# Patient Record
Sex: Female | Born: 1935
Health system: Southern US, Community
[De-identification: ages and names within clinical notes are randomized; demographics above are authoritative.]

## PROBLEM LIST (undated history)

## (undated) DIAGNOSIS — I10 Essential (primary) hypertension: Secondary | ICD-10-CM

## (undated) DIAGNOSIS — M2010 Hallux valgus (acquired), unspecified foot: Secondary | ICD-10-CM

## (undated) DIAGNOSIS — Z923 Personal history of irradiation: Secondary | ICD-10-CM

## (undated) DIAGNOSIS — Z820 Family history of epilepsy and other diseases of the nervous system: Secondary | ICD-10-CM

## (undated) DIAGNOSIS — R0789 Other chest pain: Secondary | ICD-10-CM

## (undated) DIAGNOSIS — G2581 Restless legs syndrome: Secondary | ICD-10-CM

## (undated) DIAGNOSIS — K219 Gastro-esophageal reflux disease without esophagitis: Secondary | ICD-10-CM

## (undated) DIAGNOSIS — N3941 Urge incontinence: Secondary | ICD-10-CM

## (undated) DIAGNOSIS — R42 Dizziness and giddiness: Secondary | ICD-10-CM

## (undated) DIAGNOSIS — D759 Disease of blood and blood-forming organs, unspecified: Secondary | ICD-10-CM

## (undated) DIAGNOSIS — IMO0002 Reserved for concepts with insufficient information to code with codable children: Secondary | ICD-10-CM

## (undated) DIAGNOSIS — T8859XA Other complications of anesthesia, initial encounter: Secondary | ICD-10-CM

## (undated) DIAGNOSIS — D473 Essential (hemorrhagic) thrombocythemia: Secondary | ICD-10-CM

## (undated) DIAGNOSIS — E781 Pure hyperglyceridemia: Secondary | ICD-10-CM

## (undated) DIAGNOSIS — M8580 Other specified disorders of bone density and structure, unspecified site: Secondary | ICD-10-CM

## (undated) DIAGNOSIS — K5792 Diverticulitis of intestine, part unspecified, without perforation or abscess without bleeding: Secondary | ICD-10-CM

## (undated) DIAGNOSIS — E559 Vitamin D deficiency, unspecified: Secondary | ICD-10-CM

## (undated) DIAGNOSIS — K5730 Diverticulosis of large intestine without perforation or abscess without bleeding: Secondary | ICD-10-CM

## (undated) DIAGNOSIS — M7741 Metatarsalgia, right foot: Secondary | ICD-10-CM

## (undated) DIAGNOSIS — G4700 Insomnia, unspecified: Secondary | ICD-10-CM

## (undated) DIAGNOSIS — T4145XA Adverse effect of unspecified anesthetic, initial encounter: Secondary | ICD-10-CM

## (undated) DIAGNOSIS — R1310 Dysphagia, unspecified: Secondary | ICD-10-CM

## (undated) HISTORY — DX: Dizziness and giddiness: R42

## (undated) HISTORY — DX: Urge incontinence: N39.41

## (undated) HISTORY — DX: Reserved for concepts with insufficient information to code with codable children: IMO0002

## (undated) HISTORY — DX: Essential (hemorrhagic) thrombocythemia: D47.3

## (undated) HISTORY — DX: Diverticulosis of large intestine without perforation or abscess without bleeding: K57.30

## (undated) HISTORY — DX: Family history of epilepsy and other diseases of the nervous system: Z82.0

## (undated) HISTORY — PX: BREAST EXCISIONAL BIOPSY: SUR124

## (undated) HISTORY — DX: Vitamin D deficiency, unspecified: E55.9

## (undated) HISTORY — DX: Essential (primary) hypertension: I10

## (undated) HISTORY — DX: Other chest pain: R07.89

## (undated) HISTORY — DX: Restless legs syndrome: G25.81

## (undated) HISTORY — DX: Gastro-esophageal reflux disease without esophagitis: K21.9

## (undated) HISTORY — DX: Insomnia, unspecified: G47.00

## (undated) HISTORY — DX: Diverticulitis of intestine, part unspecified, without perforation or abscess without bleeding: K57.92

## (undated) HISTORY — DX: Disease of blood and blood-forming organs, unspecified: D75.9

## (undated) HISTORY — DX: Other specified disorders of bone density and structure, unspecified site: M85.80

## (undated) HISTORY — DX: Hallux valgus (acquired), unspecified foot: M20.10

## (undated) HISTORY — DX: Dysphagia, unspecified: R13.10

## (undated) HISTORY — DX: Metatarsalgia, right foot: M77.41

## (undated) HISTORY — DX: Pure hyperglyceridemia: E78.1

---

## 1988-01-07 ENCOUNTER — Encounter (INDEPENDENT_AMBULATORY_CARE_PROVIDER_SITE_OTHER): Payer: Self-pay | Admitting: *Deleted

## 1990-01-06 HISTORY — PX: CHOLECYSTECTOMY: SHX55

## 1996-01-07 HISTORY — PX: BIOPSY BREAST: PRO8

## 1997-11-02 ENCOUNTER — Encounter: Admission: RE | Admit: 1997-11-02 | Discharge: 1997-11-02 | Payer: Self-pay | Admitting: Sports Medicine

## 1997-11-14 ENCOUNTER — Encounter: Admission: RE | Admit: 1997-11-14 | Discharge: 1997-11-14 | Payer: Self-pay | Admitting: Family Medicine

## 1997-11-22 ENCOUNTER — Ambulatory Visit (HOSPITAL_COMMUNITY): Admission: RE | Admit: 1997-11-22 | Discharge: 1997-11-22 | Payer: Self-pay | Admitting: *Deleted

## 1997-11-22 ENCOUNTER — Encounter: Payer: Self-pay | Admitting: *Deleted

## 1998-01-31 ENCOUNTER — Other Ambulatory Visit: Admission: RE | Admit: 1998-01-31 | Discharge: 1998-01-31 | Payer: Self-pay | Admitting: *Deleted

## 1998-07-19 ENCOUNTER — Encounter: Admission: RE | Admit: 1998-07-19 | Discharge: 1998-07-19 | Payer: Self-pay | Admitting: Sports Medicine

## 1998-11-26 ENCOUNTER — Ambulatory Visit (HOSPITAL_COMMUNITY): Admission: RE | Admit: 1998-11-26 | Discharge: 1998-11-26 | Payer: Self-pay | Admitting: *Deleted

## 1999-03-20 ENCOUNTER — Other Ambulatory Visit: Admission: RE | Admit: 1999-03-20 | Discharge: 1999-03-20 | Payer: Self-pay | Admitting: *Deleted

## 1999-05-02 ENCOUNTER — Encounter: Admission: RE | Admit: 1999-05-02 | Discharge: 1999-05-02 | Payer: Self-pay | Admitting: Sports Medicine

## 1999-11-04 ENCOUNTER — Encounter: Admission: RE | Admit: 1999-11-04 | Discharge: 1999-11-04 | Payer: Self-pay | Admitting: Family Medicine

## 1999-12-18 ENCOUNTER — Encounter: Payer: Self-pay | Admitting: *Deleted

## 1999-12-18 ENCOUNTER — Ambulatory Visit (HOSPITAL_COMMUNITY): Admission: RE | Admit: 1999-12-18 | Discharge: 1999-12-18 | Payer: Self-pay | Admitting: *Deleted

## 2000-03-02 ENCOUNTER — Encounter: Admission: RE | Admit: 2000-03-02 | Discharge: 2000-03-02 | Payer: Self-pay | Admitting: Family Medicine

## 2000-04-24 ENCOUNTER — Other Ambulatory Visit: Admission: RE | Admit: 2000-04-24 | Discharge: 2000-04-24 | Payer: Self-pay | Admitting: *Deleted

## 2000-05-29 ENCOUNTER — Encounter: Payer: Self-pay | Admitting: Sports Medicine

## 2000-05-29 ENCOUNTER — Encounter: Admission: RE | Admit: 2000-05-29 | Discharge: 2000-05-29 | Payer: Self-pay | Admitting: Sports Medicine

## 2000-10-09 ENCOUNTER — Encounter: Admission: RE | Admit: 2000-10-09 | Discharge: 2000-10-09 | Payer: Self-pay | Admitting: Sports Medicine

## 2000-10-28 ENCOUNTER — Encounter: Admission: RE | Admit: 2000-10-28 | Discharge: 2000-10-28 | Payer: Self-pay | Admitting: Family Medicine

## 2000-12-31 ENCOUNTER — Encounter: Payer: Self-pay | Admitting: *Deleted

## 2000-12-31 ENCOUNTER — Ambulatory Visit (HOSPITAL_COMMUNITY): Admission: RE | Admit: 2000-12-31 | Discharge: 2000-12-31 | Payer: Self-pay | Admitting: *Deleted

## 2001-05-13 ENCOUNTER — Other Ambulatory Visit: Admission: RE | Admit: 2001-05-13 | Discharge: 2001-05-13 | Payer: Self-pay | Admitting: *Deleted

## 2001-10-18 ENCOUNTER — Encounter: Admission: RE | Admit: 2001-10-18 | Discharge: 2001-10-18 | Payer: Self-pay | Admitting: Family Medicine

## 2001-11-25 ENCOUNTER — Encounter: Admission: RE | Admit: 2001-11-25 | Discharge: 2001-11-25 | Payer: Self-pay | Admitting: Sports Medicine

## 2002-01-25 ENCOUNTER — Encounter: Payer: Self-pay | Admitting: *Deleted

## 2002-01-25 ENCOUNTER — Ambulatory Visit (HOSPITAL_COMMUNITY): Admission: RE | Admit: 2002-01-25 | Discharge: 2002-01-25 | Payer: Self-pay | Admitting: *Deleted

## 2002-07-01 ENCOUNTER — Other Ambulatory Visit: Admission: RE | Admit: 2002-07-01 | Discharge: 2002-07-01 | Payer: Self-pay | Admitting: *Deleted

## 2002-10-26 ENCOUNTER — Encounter: Admission: RE | Admit: 2002-10-26 | Discharge: 2002-10-26 | Payer: Self-pay | Admitting: Family Medicine

## 2002-12-01 ENCOUNTER — Emergency Department (HOSPITAL_COMMUNITY): Admission: EM | Admit: 2002-12-01 | Discharge: 2002-12-01 | Payer: Self-pay | Admitting: Emergency Medicine

## 2002-12-06 ENCOUNTER — Encounter: Admission: RE | Admit: 2002-12-06 | Discharge: 2002-12-06 | Payer: Self-pay | Admitting: Orthopedic Surgery

## 2002-12-07 ENCOUNTER — Ambulatory Visit (HOSPITAL_COMMUNITY): Admission: RE | Admit: 2002-12-07 | Discharge: 2002-12-07 | Payer: Self-pay | Admitting: Orthopedic Surgery

## 2002-12-07 ENCOUNTER — Ambulatory Visit (HOSPITAL_BASED_OUTPATIENT_CLINIC_OR_DEPARTMENT_OTHER): Admission: RE | Admit: 2002-12-07 | Discharge: 2002-12-07 | Payer: Self-pay | Admitting: Orthopedic Surgery

## 2003-03-01 ENCOUNTER — Ambulatory Visit (HOSPITAL_COMMUNITY): Admission: RE | Admit: 2003-03-01 | Discharge: 2003-03-01 | Payer: Self-pay | Admitting: *Deleted

## 2003-03-20 ENCOUNTER — Encounter: Admission: RE | Admit: 2003-03-20 | Discharge: 2003-03-20 | Payer: Self-pay | Admitting: Family Medicine

## 2003-03-21 ENCOUNTER — Encounter: Admission: RE | Admit: 2003-03-21 | Discharge: 2003-03-21 | Payer: Self-pay | Admitting: Family Medicine

## 2003-03-21 ENCOUNTER — Encounter: Admission: RE | Admit: 2003-03-21 | Discharge: 2003-03-21 | Payer: Self-pay | Admitting: Vascular Surgery

## 2003-03-24 ENCOUNTER — Encounter: Admission: RE | Admit: 2003-03-24 | Discharge: 2003-03-24 | Payer: Self-pay | Admitting: Sports Medicine

## 2003-03-30 ENCOUNTER — Encounter: Admission: RE | Admit: 2003-03-30 | Discharge: 2003-03-30 | Payer: Self-pay | Admitting: Sports Medicine

## 2003-04-03 ENCOUNTER — Encounter: Admission: RE | Admit: 2003-04-03 | Discharge: 2003-04-03 | Payer: Self-pay | Admitting: Family Medicine

## 2003-04-07 ENCOUNTER — Encounter: Admission: RE | Admit: 2003-04-07 | Discharge: 2003-04-07 | Payer: Self-pay | Admitting: Family Medicine

## 2003-04-28 ENCOUNTER — Encounter: Admission: RE | Admit: 2003-04-28 | Discharge: 2003-04-28 | Payer: Self-pay | Admitting: Infectious Diseases

## 2003-07-13 ENCOUNTER — Encounter (INDEPENDENT_AMBULATORY_CARE_PROVIDER_SITE_OTHER): Payer: Self-pay | Admitting: Specialist

## 2003-07-13 ENCOUNTER — Other Ambulatory Visit: Admission: RE | Admit: 2003-07-13 | Discharge: 2003-07-13 | Payer: Self-pay | Admitting: Oncology

## 2003-09-28 ENCOUNTER — Ambulatory Visit: Payer: Self-pay | Admitting: Sports Medicine

## 2003-10-23 ENCOUNTER — Ambulatory Visit: Payer: Self-pay | Admitting: Sports Medicine

## 2003-11-18 ENCOUNTER — Ambulatory Visit: Payer: Self-pay | Admitting: Oncology

## 2003-12-13 ENCOUNTER — Ambulatory Visit: Payer: Self-pay | Admitting: Family Medicine

## 2003-12-18 ENCOUNTER — Ambulatory Visit: Payer: Self-pay | Admitting: Family Medicine

## 2004-01-22 ENCOUNTER — Ambulatory Visit: Payer: Self-pay | Admitting: Oncology

## 2004-03-15 ENCOUNTER — Ambulatory Visit (HOSPITAL_COMMUNITY): Admission: RE | Admit: 2004-03-15 | Discharge: 2004-03-15 | Payer: Self-pay | Admitting: *Deleted

## 2004-03-25 ENCOUNTER — Ambulatory Visit: Payer: Self-pay | Admitting: Oncology

## 2004-05-13 ENCOUNTER — Ambulatory Visit: Payer: Self-pay | Admitting: Oncology

## 2004-07-19 ENCOUNTER — Ambulatory Visit: Payer: Self-pay | Admitting: Oncology

## 2004-09-06 ENCOUNTER — Ambulatory Visit: Payer: Self-pay | Admitting: Oncology

## 2004-09-30 ENCOUNTER — Other Ambulatory Visit: Admission: RE | Admit: 2004-09-30 | Discharge: 2004-09-30 | Payer: Self-pay | Admitting: *Deleted

## 2004-10-25 ENCOUNTER — Ambulatory Visit: Payer: Self-pay | Admitting: Family Medicine

## 2004-11-04 ENCOUNTER — Ambulatory Visit: Payer: Self-pay | Admitting: Oncology

## 2004-11-14 ENCOUNTER — Ambulatory Visit: Payer: Self-pay | Admitting: Sports Medicine

## 2004-11-21 ENCOUNTER — Ambulatory Visit: Payer: Self-pay | Admitting: Sports Medicine

## 2004-12-05 ENCOUNTER — Ambulatory Visit: Payer: Self-pay | Admitting: Sports Medicine

## 2004-12-12 ENCOUNTER — Ambulatory Visit: Payer: Self-pay | Admitting: Sports Medicine

## 2004-12-19 ENCOUNTER — Ambulatory Visit: Payer: Self-pay | Admitting: Family Medicine

## 2005-01-07 ENCOUNTER — Ambulatory Visit: Payer: Self-pay | Admitting: Oncology

## 2005-03-10 ENCOUNTER — Ambulatory Visit: Payer: Self-pay | Admitting: Oncology

## 2005-04-24 ENCOUNTER — Ambulatory Visit (HOSPITAL_COMMUNITY): Admission: RE | Admit: 2005-04-24 | Discharge: 2005-04-24 | Payer: Self-pay | Admitting: Obstetrics & Gynecology

## 2005-05-07 ENCOUNTER — Ambulatory Visit: Payer: Self-pay | Admitting: Family Medicine

## 2005-05-09 ENCOUNTER — Ambulatory Visit: Payer: Self-pay | Admitting: Oncology

## 2005-05-13 LAB — MORPHOLOGY: PLT EST: INCREASED

## 2005-05-13 LAB — CBC WITH DIFFERENTIAL/PLATELET
BASO%: 0.2 % (ref 0.0–2.0)
Basophils Absolute: 0 10*3/uL (ref 0.0–0.1)
HCT: 44 % (ref 34.8–46.6)
HGB: 15 g/dL (ref 11.6–15.9)
MONO#: 0.6 10*3/uL (ref 0.1–0.9)
NEUT%: 64 % (ref 39.6–76.8)
RDW: 13.5 % (ref 11.3–14.5)
WBC: 6.4 10*3/uL (ref 3.9–10.0)
lymph#: 1.6 10*3/uL (ref 0.9–3.3)

## 2005-07-02 ENCOUNTER — Ambulatory Visit: Payer: Self-pay | Admitting: Oncology

## 2005-07-04 ENCOUNTER — Ambulatory Visit: Payer: Self-pay | Admitting: Family Medicine

## 2005-07-08 LAB — CBC WITH DIFFERENTIAL/PLATELET
BASO%: 0.4 % (ref 0.0–2.0)
HCT: 43.6 % (ref 34.8–46.6)
LYMPH%: 21.8 % (ref 14.0–48.0)
MCH: 34.4 pg — ABNORMAL HIGH (ref 26.0–34.0)
MCHC: 34.4 g/dL (ref 32.0–36.0)
MCV: 100.1 fL (ref 81.0–101.0)
MONO#: 0.6 10*3/uL (ref 0.1–0.9)
MONO%: 8.8 % (ref 0.0–13.0)
NEUT%: 66.7 % (ref 39.6–76.8)
Platelets: 485 10*3/uL — ABNORMAL HIGH (ref 145–400)
RBC: 4.35 10*6/uL (ref 3.70–5.32)

## 2005-07-08 LAB — COMPREHENSIVE METABOLIC PANEL
Alkaline Phosphatase: 49 U/L (ref 39–117)
BUN: 17 mg/dL (ref 6–23)
Creatinine, Ser: 0.83 mg/dL (ref 0.40–1.20)
Glucose, Bld: 96 mg/dL (ref 70–99)
Total Bilirubin: 0.5 mg/dL (ref 0.3–1.2)

## 2005-07-08 LAB — URIC ACID: Uric Acid, Serum: 5.1 mg/dL (ref 2.4–7.0)

## 2005-09-05 ENCOUNTER — Ambulatory Visit: Payer: Self-pay | Admitting: Oncology

## 2005-09-10 LAB — CBC WITH DIFFERENTIAL/PLATELET
BASO%: 0.3 % (ref 0.0–2.0)
EOS%: 1.5 % (ref 0.0–7.0)
HCT: 40.9 % (ref 34.8–46.6)
LYMPH%: 22.8 % (ref 14.0–48.0)
MCH: 34.3 pg — ABNORMAL HIGH (ref 26.0–34.0)
MCHC: 34.4 g/dL (ref 32.0–36.0)
MONO%: 11 % (ref 0.0–13.0)
NEUT%: 64.4 % (ref 39.6–76.8)
Platelets: 475 10*3/uL — ABNORMAL HIGH (ref 145–400)
RBC: 4.1 10*6/uL (ref 3.70–5.32)
WBC: 7.5 10*3/uL (ref 3.9–10.0)

## 2005-09-10 LAB — MORPHOLOGY: RBC Comments: NORMAL

## 2005-10-30 ENCOUNTER — Ambulatory Visit: Payer: Self-pay | Admitting: Family Medicine

## 2005-11-10 ENCOUNTER — Ambulatory Visit: Payer: Self-pay | Admitting: Oncology

## 2005-11-12 LAB — CBC WITH DIFFERENTIAL/PLATELET
BASO%: 0.4 % (ref 0.0–2.0)
EOS%: 1.4 % (ref 0.0–7.0)
HCT: 41.8 % (ref 34.8–46.6)
LYMPH%: 27.7 % (ref 14.0–48.0)
MCH: 33.9 pg (ref 26.0–34.0)
MCHC: 33.9 g/dL (ref 32.0–36.0)
MONO#: 0.7 10*3/uL (ref 0.1–0.9)
NEUT%: 60.4 % (ref 39.6–76.8)
Platelets: 510 10*3/uL — ABNORMAL HIGH (ref 145–400)
RBC: 4.18 10*6/uL (ref 3.70–5.32)
WBC: 7.1 10*3/uL (ref 3.9–10.0)
lymph#: 2 10*3/uL (ref 0.9–3.3)

## 2005-11-12 LAB — MORPHOLOGY: RBC Comments: NORMAL

## 2005-12-12 LAB — CBC WITH DIFFERENTIAL/PLATELET
BASO%: 0.4 % (ref 0.0–2.0)
HCT: 42.1 % (ref 34.8–46.6)
HGB: 14.1 g/dL (ref 11.6–15.9)
MCHC: 33.4 g/dL (ref 32.0–36.0)
MONO#: 0.6 10*3/uL (ref 0.1–0.9)
NEUT#: 4.5 10*3/uL (ref 1.5–6.5)
NEUT%: 64 % (ref 39.6–76.8)
WBC: 7 10*3/uL (ref 3.9–10.0)
lymph#: 1.8 10*3/uL (ref 0.9–3.3)

## 2006-01-03 ENCOUNTER — Ambulatory Visit: Payer: Self-pay | Admitting: Oncology

## 2006-01-06 HISTORY — PX: COLONOSCOPY: SHX174

## 2006-01-08 LAB — COMPREHENSIVE METABOLIC PANEL
ALT: 21 U/L (ref 0–35)
Albumin: 4.5 g/dL (ref 3.5–5.2)
CO2: 31 mEq/L (ref 19–32)
Calcium: 9.5 mg/dL (ref 8.4–10.5)
Chloride: 103 mEq/L (ref 96–112)
Creatinine, Ser: 0.78 mg/dL (ref 0.40–1.20)
Sodium: 145 mEq/L (ref 135–145)
Total Protein: 7.2 g/dL (ref 6.0–8.3)

## 2006-01-08 LAB — CBC WITH DIFFERENTIAL/PLATELET
BASO%: 0.2 % (ref 0.0–2.0)
HCT: 45.2 % (ref 34.8–46.6)
LYMPH%: 24.8 % (ref 14.0–48.0)
MCHC: 33.3 g/dL (ref 32.0–36.0)
MONO#: 0.6 10*3/uL (ref 0.1–0.9)
NEUT%: 63.6 % (ref 39.6–76.8)
Platelets: 535 10*3/uL — ABNORMAL HIGH (ref 145–400)
WBC: 6.8 10*3/uL (ref 3.9–10.0)

## 2006-01-08 LAB — LACTATE DEHYDROGENASE: LDH: 145 U/L (ref 94–250)

## 2006-01-08 LAB — MORPHOLOGY

## 2006-02-12 LAB — CBC WITH DIFFERENTIAL/PLATELET
BASO%: 0.4 % (ref 0.0–2.0)
EOS%: 1.7 % (ref 0.0–7.0)
MCH: 34.5 pg — ABNORMAL HIGH (ref 26.0–34.0)
MCHC: 35.2 g/dL (ref 32.0–36.0)
RBC: 4.27 10*6/uL (ref 3.70–5.32)
RDW: 13.4 % (ref 11.3–14.5)
lymph#: 1.6 10*3/uL (ref 0.9–3.3)

## 2006-03-05 DIAGNOSIS — M25569 Pain in unspecified knee: Secondary | ICD-10-CM

## 2006-03-05 DIAGNOSIS — D259 Leiomyoma of uterus, unspecified: Secondary | ICD-10-CM

## 2006-03-05 DIAGNOSIS — K5730 Diverticulosis of large intestine without perforation or abscess without bleeding: Secondary | ICD-10-CM | POA: Insufficient documentation

## 2006-03-05 HISTORY — DX: Diverticulosis of large intestine without perforation or abscess without bleeding: K57.30

## 2006-03-06 ENCOUNTER — Encounter (INDEPENDENT_AMBULATORY_CARE_PROVIDER_SITE_OTHER): Payer: Self-pay | Admitting: *Deleted

## 2006-03-09 ENCOUNTER — Ambulatory Visit: Payer: Self-pay | Admitting: Oncology

## 2006-03-12 LAB — CBC WITH DIFFERENTIAL/PLATELET
Basophils Absolute: 0 10*3/uL (ref 0.0–0.1)
EOS%: 1.7 % (ref 0.0–7.0)
Eosinophils Absolute: 0.1 10*3/uL (ref 0.0–0.5)
HGB: 13.7 g/dL (ref 11.6–15.9)
MCH: 33.9 pg (ref 26.0–34.0)
NEUT#: 5 10*3/uL (ref 1.5–6.5)
RBC: 4.04 10*6/uL (ref 3.70–5.32)
RDW: 13.7 % (ref 11.3–14.5)
lymph#: 1.5 10*3/uL (ref 0.9–3.3)

## 2006-04-09 LAB — CBC WITH DIFFERENTIAL/PLATELET
BASO%: 0.9 % (ref 0.0–2.0)
Eosinophils Absolute: 0.1 10*3/uL (ref 0.0–0.5)
MONO#: 1 10*3/uL — ABNORMAL HIGH (ref 0.1–0.9)
NEUT#: 4.2 10*3/uL (ref 1.5–6.5)
RBC: 4.14 10*6/uL (ref 3.70–5.32)
RDW: 13.6 % (ref 11.3–14.5)
WBC: 7.2 10*3/uL (ref 3.9–10.0)
lymph#: 1.9 10*3/uL (ref 0.9–3.3)

## 2006-04-09 LAB — MORPHOLOGY: RBC Comments: NORMAL

## 2006-05-04 ENCOUNTER — Ambulatory Visit: Payer: Self-pay | Admitting: Oncology

## 2006-05-07 LAB — CBC WITH DIFFERENTIAL/PLATELET
BASO%: 0.4 % (ref 0.0–2.0)
Eosinophils Absolute: 0.1 10*3/uL (ref 0.0–0.5)
MCHC: 35 g/dL (ref 32.0–36.0)
MONO#: 0.7 10*3/uL (ref 0.1–0.9)
MONO%: 10.3 % (ref 0.0–13.0)
NEUT#: 4.5 10*3/uL (ref 1.5–6.5)
RBC: 4.3 10*6/uL (ref 3.70–5.32)
RDW: 13.8 % (ref 11.3–14.5)
WBC: 7.1 10*3/uL (ref 3.9–10.0)

## 2006-05-07 LAB — COMPREHENSIVE METABOLIC PANEL
AST: 18 U/L (ref 0–37)
Albumin: 4.4 g/dL (ref 3.5–5.2)
Alkaline Phosphatase: 49 U/L (ref 39–117)
BUN: 17 mg/dL (ref 6–23)
Glucose, Bld: 157 mg/dL — ABNORMAL HIGH (ref 70–99)
Potassium: 4 mEq/L (ref 3.5–5.3)
Total Bilirubin: 0.5 mg/dL (ref 0.3–1.2)

## 2006-05-07 LAB — MORPHOLOGY: RBC Comments: NORMAL

## 2006-05-14 ENCOUNTER — Ambulatory Visit (HOSPITAL_COMMUNITY): Admission: RE | Admit: 2006-05-14 | Discharge: 2006-05-14 | Payer: Self-pay | Admitting: Obstetrics & Gynecology

## 2006-06-15 ENCOUNTER — Encounter: Payer: Self-pay | Admitting: Sports Medicine

## 2006-06-18 ENCOUNTER — Telehealth (INDEPENDENT_AMBULATORY_CARE_PROVIDER_SITE_OTHER): Payer: Self-pay | Admitting: *Deleted

## 2006-07-07 ENCOUNTER — Ambulatory Visit: Payer: Self-pay | Admitting: Oncology

## 2006-07-09 LAB — CBC WITH DIFFERENTIAL/PLATELET
Eosinophils Absolute: 0.1 10*3/uL (ref 0.0–0.5)
HCT: 42 % (ref 34.8–46.6)
LYMPH%: 23.2 % (ref 14.0–48.0)
MCHC: 35.2 g/dL (ref 32.0–36.0)
MCV: 98.4 fL (ref 81.0–101.0)
MONO#: 0.8 10*3/uL (ref 0.1–0.9)
MONO%: 11.3 % (ref 0.0–13.0)
NEUT#: 4.2 10*3/uL (ref 1.5–6.5)
NEUT%: 63.5 % (ref 39.6–76.8)
Platelets: 493 10*3/uL — ABNORMAL HIGH (ref 145–400)
RBC: 4.27 10*6/uL (ref 3.70–5.32)
WBC: 6.6 10*3/uL (ref 3.9–10.0)

## 2006-07-09 LAB — MORPHOLOGY: RBC Comments: NORMAL

## 2006-08-13 LAB — CBC WITH DIFFERENTIAL/PLATELET
Basophils Absolute: 0 10*3/uL (ref 0.0–0.1)
Eosinophils Absolute: 0.1 10*3/uL (ref 0.0–0.5)
HCT: 42 % (ref 34.8–46.6)
HGB: 14.6 g/dL (ref 11.6–15.9)
LYMPH%: 19.9 % (ref 14.0–48.0)
MCV: 97.8 fL (ref 81.0–101.0)
MONO#: 1 10*3/uL — ABNORMAL HIGH (ref 0.1–0.9)
MONO%: 13.3 % — ABNORMAL HIGH (ref 0.0–13.0)
NEUT#: 4.8 10*3/uL (ref 1.5–6.5)
NEUT%: 64.9 % (ref 39.6–76.8)
Platelets: 528 10*3/uL — ABNORMAL HIGH (ref 145–400)
WBC: 7.4 10*3/uL (ref 3.9–10.0)

## 2006-09-09 ENCOUNTER — Ambulatory Visit: Payer: Self-pay | Admitting: Oncology

## 2006-09-11 LAB — CBC WITH DIFFERENTIAL/PLATELET
BASO%: 0.2 % (ref 0.0–2.0)
HCT: 42.8 % (ref 34.8–46.6)
LYMPH%: 22.2 % (ref 14.0–48.0)
MCH: 34.6 pg — ABNORMAL HIGH (ref 26.0–34.0)
MCHC: 35.4 g/dL (ref 32.0–36.0)
MCV: 97.9 fL (ref 81.0–101.0)
MONO#: 0.6 10*3/uL (ref 0.1–0.9)
MONO%: 7.6 % (ref 0.0–13.0)
NEUT%: 68.5 % (ref 39.6–76.8)
Platelets: 536 10*3/uL — ABNORMAL HIGH (ref 145–400)
RBC: 4.37 10*6/uL (ref 3.70–5.32)
WBC: 7.2 10*3/uL (ref 3.9–10.0)

## 2006-09-11 LAB — MORPHOLOGY: PLT EST: INCREASED

## 2006-09-21 ENCOUNTER — Ambulatory Visit: Payer: Self-pay | Admitting: Internal Medicine

## 2006-10-05 ENCOUNTER — Ambulatory Visit: Payer: Self-pay | Admitting: Internal Medicine

## 2006-10-06 LAB — HM COLONOSCOPY

## 2006-10-09 LAB — CBC WITH DIFFERENTIAL/PLATELET
BASO%: 0.3 % (ref 0.0–2.0)
EOS%: 2.2 % (ref 0.0–7.0)
MCH: 34.8 pg — ABNORMAL HIGH (ref 26.0–34.0)
MCHC: 35.3 g/dL (ref 32.0–36.0)
MONO#: 0.6 10*3/uL (ref 0.1–0.9)
RBC: 4.34 10*6/uL (ref 3.70–5.32)
RDW: 14.3 % (ref 11.3–14.5)
WBC: 7.6 10*3/uL (ref 3.9–10.0)
lymph#: 1.6 10*3/uL (ref 0.9–3.3)

## 2006-11-11 ENCOUNTER — Ambulatory Visit: Payer: Self-pay | Admitting: Oncology

## 2006-11-13 ENCOUNTER — Encounter: Payer: Self-pay | Admitting: Sports Medicine

## 2006-11-13 LAB — CBC WITH DIFFERENTIAL/PLATELET
BASO%: 0.6 % (ref 0.0–2.0)
Basophils Absolute: 0 10*3/uL (ref 0.0–0.1)
EOS%: 1.1 % (ref 0.0–7.0)
HCT: 39.9 % (ref 34.8–46.6)
HGB: 13.9 g/dL (ref 11.6–15.9)
MONO#: 0.7 10*3/uL (ref 0.1–0.9)
NEUT%: 63.5 % (ref 39.6–76.8)
RDW: 13.9 % (ref 11.3–14.5)
WBC: 7 10*3/uL (ref 3.9–10.0)
lymph#: 1.8 10*3/uL (ref 0.9–3.3)

## 2006-11-13 LAB — COMPREHENSIVE METABOLIC PANEL
ALT: 19 U/L (ref 0–35)
AST: 18 U/L (ref 0–37)
Alkaline Phosphatase: 47 U/L (ref 39–117)
BUN: 17 mg/dL (ref 6–23)
Calcium: 9.7 mg/dL (ref 8.4–10.5)
Chloride: 106 mEq/L (ref 96–112)
Creatinine, Ser: 0.89 mg/dL (ref 0.40–1.20)
Potassium: 4.6 mEq/L (ref 3.5–5.3)

## 2006-11-13 LAB — MORPHOLOGY: PLT EST: INCREASED

## 2006-11-13 LAB — LACTATE DEHYDROGENASE: LDH: 142 U/L (ref 94–250)

## 2006-11-17 ENCOUNTER — Encounter: Payer: Self-pay | Admitting: Sports Medicine

## 2006-11-24 ENCOUNTER — Ambulatory Visit: Payer: Self-pay | Admitting: Family Medicine

## 2006-11-24 ENCOUNTER — Encounter (INDEPENDENT_AMBULATORY_CARE_PROVIDER_SITE_OTHER): Payer: Self-pay | Admitting: Family Medicine

## 2006-11-24 LAB — CONVERTED CEMR LAB
BUN: 11 mg/dL (ref 6–23)
CRP: 5.7 mg/dL — ABNORMAL HIGH (ref <0.6)
Creatinine, Ser: 0.77 mg/dL (ref 0.40–1.20)
Eosinophils Absolute: 0.1 10*3/uL — ABNORMAL LOW (ref 0.2–0.7)
Glucose, Bld: 107 mg/dL — ABNORMAL HIGH (ref 70–99)
HCT: 44.2 % (ref 36.0–46.0)
Hemoglobin: 14.1 g/dL (ref 12.0–15.0)
Lymphs Abs: 1.4 10*3/uL (ref 0.7–4.0)
MCHC: 31.9 g/dL (ref 30.0–36.0)
MCV: 103.8 fL — ABNORMAL HIGH (ref 78.0–100.0)
Monocytes Absolute: 1.2 10*3/uL — ABNORMAL HIGH (ref 0.1–1.0)
Monocytes Relative: 11 % (ref 3–12)
Neutrophils Relative %: 75 % (ref 43–77)
Potassium: 4.2 meq/L (ref 3.5–5.3)
RBC: 4.26 M/uL (ref 3.87–5.11)
WBC: 10.5 10*3/uL (ref 4.0–10.5)

## 2006-11-25 ENCOUNTER — Telehealth (INDEPENDENT_AMBULATORY_CARE_PROVIDER_SITE_OTHER): Payer: Self-pay | Admitting: *Deleted

## 2006-11-25 ENCOUNTER — Ambulatory Visit (HOSPITAL_COMMUNITY): Admission: RE | Admit: 2006-11-25 | Discharge: 2006-11-25 | Payer: Self-pay | Admitting: Family Medicine

## 2006-11-27 ENCOUNTER — Ambulatory Visit: Payer: Self-pay | Admitting: Family Medicine

## 2006-12-02 ENCOUNTER — Encounter: Payer: Self-pay | Admitting: Sports Medicine

## 2006-12-15 LAB — CBC WITH DIFFERENTIAL/PLATELET
BASO%: 1.2 % (ref 0.0–2.0)
LYMPH%: 20.6 % (ref 14.0–48.0)
MCHC: 34.5 g/dL (ref 32.0–36.0)
MCV: 99.3 fL (ref 81.0–101.0)
MONO#: 0.6 10*3/uL (ref 0.1–0.9)
MONO%: 8.6 % (ref 0.0–13.0)
Platelets: 682 10*3/uL — ABNORMAL HIGH (ref 145–400)
RBC: 4.26 10*6/uL (ref 3.70–5.32)
WBC: 7.3 10*3/uL (ref 3.9–10.0)

## 2006-12-15 LAB — MORPHOLOGY

## 2006-12-27 ENCOUNTER — Emergency Department (HOSPITAL_COMMUNITY): Admission: EM | Admit: 2006-12-27 | Discharge: 2006-12-27 | Payer: Self-pay | Admitting: Family Medicine

## 2006-12-28 ENCOUNTER — Ambulatory Visit: Payer: Self-pay | Admitting: Oncology

## 2007-01-01 LAB — CBC WITH DIFFERENTIAL/PLATELET
BASO%: 0.4 % (ref 0.0–2.0)
EOS%: 1.7 % (ref 0.0–7.0)
HCT: 41.8 % (ref 34.8–46.6)
LYMPH%: 22.4 % (ref 14.0–48.0)
MCH: 34.2 pg — ABNORMAL HIGH (ref 26.0–34.0)
MCHC: 34.4 g/dL (ref 32.0–36.0)
NEUT%: 66.5 % (ref 39.6–76.8)
Platelets: 574 10*3/uL — ABNORMAL HIGH (ref 145–400)

## 2007-01-05 ENCOUNTER — Ambulatory Visit: Payer: Self-pay | Admitting: Sports Medicine

## 2007-01-05 DIAGNOSIS — E781 Pure hyperglyceridemia: Secondary | ICD-10-CM

## 2007-01-05 DIAGNOSIS — D759 Disease of blood and blood-forming organs, unspecified: Secondary | ICD-10-CM

## 2007-01-05 HISTORY — DX: Pure hyperglyceridemia: E78.1

## 2007-01-05 HISTORY — DX: Disease of blood and blood-forming organs, unspecified: D75.9

## 2007-01-08 LAB — CONVERTED CEMR LAB
Total CHOL/HDL Ratio: 3.7
VLDL: 33 mg/dL (ref 0–40)

## 2007-02-05 LAB — MORPHOLOGY

## 2007-02-05 LAB — CBC WITH DIFFERENTIAL/PLATELET
BASO%: 0.4 % (ref 0.0–2.0)
EOS%: 0.8 % (ref 0.0–7.0)
MCH: 33.8 pg (ref 26.0–34.0)
MCHC: 33.8 g/dL (ref 32.0–36.0)
NEUT%: 63.6 % (ref 39.6–76.8)
RDW: 14.7 % — ABNORMAL HIGH (ref 11.3–14.5)
lymph#: 1.6 10*3/uL (ref 0.9–3.3)

## 2007-03-04 ENCOUNTER — Ambulatory Visit: Payer: Self-pay | Admitting: Family Medicine

## 2007-03-08 ENCOUNTER — Emergency Department (HOSPITAL_COMMUNITY): Admission: EM | Admit: 2007-03-08 | Discharge: 2007-03-08 | Payer: Self-pay | Admitting: Family Medicine

## 2007-03-08 ENCOUNTER — Ambulatory Visit: Payer: Self-pay | Admitting: Oncology

## 2007-03-10 ENCOUNTER — Encounter: Payer: Self-pay | Admitting: Sports Medicine

## 2007-03-10 LAB — CBC WITH DIFFERENTIAL/PLATELET
Basophils Absolute: 0 10*3/uL (ref 0.0–0.1)
EOS%: 1.6 % (ref 0.0–7.0)
HGB: 14.6 g/dL (ref 11.6–15.9)
MCH: 35 pg — ABNORMAL HIGH (ref 26.0–34.0)
MCV: 101 fL (ref 81.0–101.0)
MONO%: 14.8 % — ABNORMAL HIGH (ref 0.0–13.0)
RBC: 4.17 10*6/uL (ref 3.70–5.32)
RDW: 14.9 % — ABNORMAL HIGH (ref 11.3–14.5)

## 2007-03-10 LAB — CONVERTED CEMR LAB
Hemoglobin: 14.6 g/dL
Platelets: 425 10*3/uL
WBC: 7.1 10*3/uL

## 2007-03-10 LAB — COMPREHENSIVE METABOLIC PANEL
Albumin: 4.4 g/dL (ref 3.5–5.2)
Alkaline Phosphatase: 61 U/L (ref 39–117)
BUN: 16 mg/dL (ref 6–23)
Creatinine, Ser: 1.12 mg/dL (ref 0.40–1.20)
Glucose, Bld: 130 mg/dL — ABNORMAL HIGH (ref 70–99)
Total Bilirubin: 0.6 mg/dL (ref 0.3–1.2)

## 2007-03-10 LAB — MORPHOLOGY: RBC Comments: NORMAL

## 2007-03-10 LAB — URIC ACID: Uric Acid, Serum: 5.6 mg/dL (ref 2.4–7.0)

## 2007-03-15 ENCOUNTER — Encounter: Payer: Self-pay | Admitting: Sports Medicine

## 2007-04-13 LAB — CBC WITH DIFFERENTIAL/PLATELET
EOS%: 0.5 % (ref 0.0–7.0)
MCH: 35.6 pg — ABNORMAL HIGH (ref 26.0–34.0)
MCHC: 34.7 g/dL (ref 32.0–36.0)
MCV: 102.5 fL — ABNORMAL HIGH (ref 81.0–101.0)
MONO%: 11.2 % (ref 0.0–13.0)
RBC: 3.89 10*6/uL (ref 3.70–5.32)
RDW: 14.3 % (ref 11.3–14.5)

## 2007-04-13 LAB — MORPHOLOGY: RBC Comments: NORMAL

## 2007-05-07 ENCOUNTER — Ambulatory Visit: Payer: Self-pay | Admitting: Oncology

## 2007-05-12 LAB — CBC WITH DIFFERENTIAL/PLATELET
EOS%: 1.2 % (ref 0.0–7.0)
MCH: 35.5 pg — ABNORMAL HIGH (ref 26.0–34.0)
MCV: 103.6 fL — ABNORMAL HIGH (ref 81.0–101.0)
MONO%: 10.7 % (ref 0.0–13.0)
NEUT#: 3.6 10*3/uL (ref 1.5–6.5)
RBC: 3.7 10*6/uL (ref 3.70–5.32)
RDW: 13.9 % (ref 11.3–14.5)

## 2007-05-12 LAB — MORPHOLOGY: RBC Comments: NORMAL

## 2007-05-27 ENCOUNTER — Ambulatory Visit (HOSPITAL_COMMUNITY): Admission: RE | Admit: 2007-05-27 | Discharge: 2007-05-27 | Payer: Self-pay | Admitting: Sports Medicine

## 2007-07-08 ENCOUNTER — Ambulatory Visit: Payer: Self-pay | Admitting: Oncology

## 2007-07-13 LAB — CBC WITH DIFFERENTIAL/PLATELET
EOS%: 1.6 % (ref 0.0–7.0)
MCH: 36.3 pg — ABNORMAL HIGH (ref 26.0–34.0)
MCHC: 34.4 g/dL (ref 32.0–36.0)
MCV: 105 fL — ABNORMAL HIGH (ref 81.0–101.0)
MONO%: 13 % (ref 0.0–13.0)
RBC: 3.79 10*6/uL (ref 3.70–5.32)
RDW: 13.2 % (ref 11.3–14.5)

## 2007-07-13 LAB — MORPHOLOGY: PLT EST: INCREASED

## 2007-09-08 ENCOUNTER — Ambulatory Visit: Payer: Self-pay | Admitting: Oncology

## 2007-09-14 ENCOUNTER — Encounter: Payer: Self-pay | Admitting: Family Medicine

## 2007-09-14 LAB — COMPREHENSIVE METABOLIC PANEL
ALT: 15 U/L (ref 0–35)
AST: 13 U/L (ref 0–37)
Albumin: 4.1 g/dL (ref 3.5–5.2)
Alkaline Phosphatase: 42 U/L (ref 39–117)
CO2: 24 mEq/L (ref 19–32)
Calcium: 9 mg/dL (ref 8.4–10.5)
Chloride: 106 mEq/L (ref 96–112)
Creatinine, Ser: 0.77 mg/dL (ref 0.40–1.20)
Potassium: 4.3 mEq/L (ref 3.5–5.3)
Sodium: 141 mEq/L (ref 135–145)
Total Protein: 6.6 g/dL (ref 6.0–8.3)

## 2007-09-14 LAB — CBC WITH DIFFERENTIAL/PLATELET
Basophils Absolute: 0 10*3/uL (ref 0.0–0.1)
Eosinophils Absolute: 0.1 10*3/uL (ref 0.0–0.5)
HGB: 13.6 g/dL (ref 11.6–15.9)
MONO#: 0.5 10*3/uL (ref 0.1–0.9)
NEUT#: 3.6 10*3/uL (ref 1.5–6.5)
RBC: 3.78 10*6/uL (ref 3.70–5.32)
RDW: 13.4 % (ref 11.3–14.5)
WBC: 5.4 10*3/uL (ref 3.9–10.0)

## 2007-09-14 LAB — MORPHOLOGY: PLT EST: INCREASED

## 2007-09-14 LAB — LACTATE DEHYDROGENASE: LDH: 128 U/L (ref 94–250)

## 2007-09-20 ENCOUNTER — Telehealth (INDEPENDENT_AMBULATORY_CARE_PROVIDER_SITE_OTHER): Payer: Self-pay | Admitting: *Deleted

## 2007-10-22 LAB — CBC WITH DIFFERENTIAL/PLATELET
Basophils Absolute: 0 10*3/uL (ref 0.0–0.1)
Eosinophils Absolute: 0.1 10*3/uL (ref 0.0–0.5)
HGB: 13.3 g/dL (ref 11.6–15.9)
LYMPH%: 24 % (ref 14.0–48.0)
MCV: 105.6 fL — ABNORMAL HIGH (ref 81.0–101.0)
MONO#: 0.5 10*3/uL (ref 0.1–0.9)
MONO%: 9.3 % (ref 0.0–13.0)
NEUT#: 3.6 10*3/uL (ref 1.5–6.5)
Platelets: 411 10*3/uL — ABNORMAL HIGH (ref 145–400)
RBC: 3.62 10*6/uL — ABNORMAL LOW (ref 3.70–5.32)
RDW: 13.8 % (ref 11.3–14.5)
WBC: 5.6 10*3/uL (ref 3.9–10.0)

## 2007-10-22 LAB — MORPHOLOGY

## 2007-10-25 ENCOUNTER — Telehealth: Payer: Self-pay | Admitting: Family Medicine

## 2007-11-16 ENCOUNTER — Ambulatory Visit: Payer: Self-pay | Admitting: Oncology

## 2007-11-18 LAB — CBC WITH DIFFERENTIAL/PLATELET
Basophils Absolute: 0 10*3/uL (ref 0.0–0.1)
Eosinophils Absolute: 0.1 10*3/uL (ref 0.0–0.5)
HCT: 38.3 % (ref 34.8–46.6)
HGB: 13.2 g/dL (ref 11.6–15.9)
LYMPH%: 28.1 % (ref 14.0–48.0)
MCV: 106.3 fL — ABNORMAL HIGH (ref 81.0–101.0)
MONO#: 0.6 10*3/uL (ref 0.1–0.9)
MONO%: 11 % (ref 0.0–13.0)
NEUT#: 3.2 10*3/uL (ref 1.5–6.5)
Platelets: 479 10*3/uL — ABNORMAL HIGH (ref 145–400)
WBC: 5.4 10*3/uL (ref 3.9–10.0)

## 2007-11-18 LAB — MORPHOLOGY: RBC Comments: NORMAL

## 2007-12-16 LAB — CBC WITH DIFFERENTIAL/PLATELET
BASO%: 0.4 % (ref 0.0–2.0)
Basophils Absolute: 0 10*3/uL (ref 0.0–0.1)
EOS%: 1.4 % (ref 0.0–7.0)
HCT: 39.3 % (ref 34.8–46.6)
HGB: 13.5 g/dL (ref 11.6–15.9)
LYMPH%: 28.9 % (ref 14.0–48.0)
MCH: 36.6 pg — ABNORMAL HIGH (ref 26.0–34.0)
MCHC: 34.2 g/dL (ref 32.0–36.0)
MCV: 106.8 fL — ABNORMAL HIGH (ref 81.0–101.0)
MONO%: 10.3 % (ref 0.0–13.0)
NEUT%: 59 % (ref 39.6–76.8)
Platelets: 485 10*3/uL — ABNORMAL HIGH (ref 145–400)

## 2007-12-16 LAB — MORPHOLOGY

## 2008-01-07 HISTORY — PX: CATARACT EXTRACTION: SUR2

## 2008-01-11 ENCOUNTER — Ambulatory Visit: Payer: Self-pay | Admitting: Oncology

## 2008-01-18 LAB — CBC WITH DIFFERENTIAL/PLATELET
BASO%: 0.4 % (ref 0.0–2.0)
Basophils Absolute: 0 10*3/uL (ref 0.0–0.1)
EOS%: 1 % (ref 0.0–7.0)
HCT: 40.4 % (ref 34.8–46.6)
LYMPH%: 28.8 % (ref 14.0–48.0)
MCH: 36.5 pg — ABNORMAL HIGH (ref 26.0–34.0)
MCHC: 34.2 g/dL (ref 32.0–36.0)
MCV: 106.6 fL — ABNORMAL HIGH (ref 81.0–101.0)
MONO%: 10.4 % (ref 0.0–13.0)
NEUT%: 59.4 % (ref 39.6–76.8)
lymph#: 1.6 10*3/uL (ref 0.9–3.3)

## 2008-01-18 LAB — MORPHOLOGY: PLT EST: ADEQUATE

## 2008-02-15 ENCOUNTER — Encounter: Payer: Self-pay | Admitting: Family Medicine

## 2008-02-22 ENCOUNTER — Ambulatory Visit: Payer: Self-pay | Admitting: Family Medicine

## 2008-02-22 DIAGNOSIS — I10 Essential (primary) hypertension: Secondary | ICD-10-CM | POA: Insufficient documentation

## 2008-02-22 HISTORY — DX: Essential (primary) hypertension: I10

## 2008-02-24 ENCOUNTER — Telehealth: Payer: Self-pay | Admitting: *Deleted

## 2008-02-24 LAB — CONVERTED CEMR LAB
CO2: 26 meq/L (ref 19–32)
Glucose, Bld: 88 mg/dL (ref 70–99)
Potassium: 4.7 meq/L (ref 3.5–5.3)
Sodium: 140 meq/L (ref 135–145)

## 2008-02-29 ENCOUNTER — Ambulatory Visit (HOSPITAL_COMMUNITY): Admission: RE | Admit: 2008-02-29 | Discharge: 2008-02-29 | Payer: Self-pay | Admitting: Family Medicine

## 2008-03-10 ENCOUNTER — Ambulatory Visit: Payer: Self-pay | Admitting: Oncology

## 2008-03-14 ENCOUNTER — Encounter: Payer: Self-pay | Admitting: Family Medicine

## 2008-03-14 LAB — CBC WITH DIFFERENTIAL/PLATELET
BASO%: 0.3 % (ref 0.0–2.0)
EOS%: 0.7 % (ref 0.0–7.0)
HCT: 38.4 % (ref 34.8–46.6)
LYMPH%: 20 % (ref 14.0–49.7)
MCH: 36.2 pg — ABNORMAL HIGH (ref 25.1–34.0)
MCHC: 34.2 g/dL (ref 31.5–36.0)
MONO%: 10.9 % (ref 0.0–14.0)
NEUT%: 68.1 % (ref 38.4–76.8)
Platelets: 493 10*3/uL — ABNORMAL HIGH (ref 145–400)
lymph#: 1.3 10*3/uL (ref 0.9–3.3)

## 2008-03-14 LAB — COMPREHENSIVE METABOLIC PANEL
ALT: 16 U/L (ref 0–35)
AST: 13 U/L (ref 0–37)
CO2: 27 mEq/L (ref 19–32)
Chloride: 107 mEq/L (ref 96–112)
Sodium: 142 mEq/L (ref 135–145)
Total Bilirubin: 0.4 mg/dL (ref 0.3–1.2)
Total Protein: 6.6 g/dL (ref 6.0–8.3)

## 2008-03-14 LAB — LACTATE DEHYDROGENASE: LDH: 126 U/L (ref 94–250)

## 2008-03-15 ENCOUNTER — Telehealth: Payer: Self-pay | Admitting: Family Medicine

## 2008-03-17 ENCOUNTER — Encounter: Payer: Self-pay | Admitting: Family Medicine

## 2008-03-17 ENCOUNTER — Telehealth: Payer: Self-pay | Admitting: Family Medicine

## 2008-03-22 ENCOUNTER — Encounter: Payer: Self-pay | Admitting: Family Medicine

## 2008-05-05 ENCOUNTER — Ambulatory Visit: Payer: Self-pay | Admitting: Oncology

## 2008-06-19 ENCOUNTER — Encounter: Payer: Self-pay | Admitting: Family Medicine

## 2008-06-19 ENCOUNTER — Telehealth: Payer: Self-pay | Admitting: Family Medicine

## 2008-06-26 ENCOUNTER — Telehealth: Payer: Self-pay | Admitting: Family Medicine

## 2008-06-27 ENCOUNTER — Ambulatory Visit: Payer: Self-pay | Admitting: Family Medicine

## 2008-06-30 ENCOUNTER — Ambulatory Visit: Payer: Self-pay | Admitting: Oncology

## 2008-07-04 LAB — CBC WITH DIFFERENTIAL/PLATELET
BASO%: 0.4 % (ref 0.0–2.0)
LYMPH%: 27.1 % (ref 14.0–49.7)
MCHC: 34.5 g/dL (ref 31.5–36.0)
MONO#: 0.5 10*3/uL (ref 0.1–0.9)
Platelets: 449 10*3/uL — ABNORMAL HIGH (ref 145–400)
RBC: 3.72 10*6/uL (ref 3.70–5.45)
WBC: 5.9 10*3/uL (ref 3.9–10.3)
lymph#: 1.6 10*3/uL (ref 0.9–3.3)

## 2008-07-04 LAB — MORPHOLOGY

## 2008-07-27 ENCOUNTER — Encounter: Payer: Self-pay | Admitting: Family Medicine

## 2008-08-08 ENCOUNTER — Ambulatory Visit: Payer: Self-pay | Admitting: Sports Medicine

## 2008-08-25 ENCOUNTER — Ambulatory Visit: Payer: Self-pay | Admitting: Oncology

## 2008-08-29 ENCOUNTER — Encounter: Payer: Self-pay | Admitting: Family Medicine

## 2008-08-29 LAB — CBC WITH DIFFERENTIAL/PLATELET
Eosinophils Absolute: 0.1 10*3/uL (ref 0.0–0.5)
MONO#: 0.7 10*3/uL (ref 0.1–0.9)
NEUT#: 4 10*3/uL (ref 1.5–6.5)
RBC: 3.6 10*6/uL — ABNORMAL LOW (ref 3.70–5.45)
RDW: 13.7 % (ref 11.2–14.5)
WBC: 6.3 10*3/uL (ref 3.9–10.3)
lymph#: 1.5 10*3/uL (ref 0.9–3.3)

## 2008-08-29 LAB — MORPHOLOGY: PLT EST: INCREASED

## 2008-09-12 ENCOUNTER — Encounter: Payer: Self-pay | Admitting: Family Medicine

## 2008-09-12 LAB — CBC WITH DIFFERENTIAL/PLATELET
BASO%: 0.5 % (ref 0.0–2.0)
MCHC: 34.1 g/dL (ref 31.5–36.0)
MONO#: 0.7 10*3/uL (ref 0.1–0.9)
NEUT#: 3.4 10*3/uL (ref 1.5–6.5)
RBC: 3.81 10*6/uL (ref 3.70–5.45)
RDW: 13.4 % (ref 11.2–14.5)
WBC: 5.5 10*3/uL (ref 3.9–10.3)
lymph#: 1.4 10*3/uL (ref 0.9–3.3)

## 2008-09-12 LAB — MORPHOLOGY
PLT EST: INCREASED
RBC Comments: NORMAL

## 2008-09-12 LAB — COMPREHENSIVE METABOLIC PANEL
ALT: 16 U/L (ref 0–35)
Alkaline Phosphatase: 41 U/L (ref 39–117)
CO2: 27 mEq/L (ref 19–32)
Creatinine, Ser: 0.83 mg/dL (ref 0.40–1.20)
Total Bilirubin: 0.5 mg/dL (ref 0.3–1.2)

## 2008-09-12 LAB — LACTATE DEHYDROGENASE: LDH: 144 U/L (ref 94–250)

## 2008-10-05 ENCOUNTER — Ambulatory Visit: Payer: Self-pay | Admitting: Sports Medicine

## 2008-11-02 ENCOUNTER — Ambulatory Visit: Payer: Self-pay | Admitting: Oncology

## 2008-11-07 LAB — CBC WITH DIFFERENTIAL/PLATELET
BASO%: 0.1 % (ref 0.0–2.0)
EOS%: 0.9 % (ref 0.0–7.0)
LYMPH%: 22.5 % (ref 14.0–49.7)
MCHC: 33.5 g/dL (ref 31.5–36.0)
MCV: 106.7 fL — ABNORMAL HIGH (ref 79.5–101.0)
MONO%: 10.4 % (ref 0.0–14.0)
Platelets: 542 10*3/uL — ABNORMAL HIGH (ref 145–400)
RBC: 3.7 10*6/uL (ref 3.70–5.45)
WBC: 6.2 10*3/uL (ref 3.9–10.3)

## 2008-11-07 LAB — MORPHOLOGY
PLT EST: INCREASED
RBC Comments: NORMAL

## 2008-11-16 ENCOUNTER — Telehealth: Payer: Self-pay | Admitting: Family Medicine

## 2008-11-27 ENCOUNTER — Ambulatory Visit: Payer: Self-pay | Admitting: Sports Medicine

## 2008-12-20 ENCOUNTER — Ambulatory Visit: Payer: Self-pay | Admitting: Family Medicine

## 2009-01-02 ENCOUNTER — Ambulatory Visit: Payer: Self-pay | Admitting: Oncology

## 2009-01-04 LAB — CBC WITH DIFFERENTIAL/PLATELET
Basophils Absolute: 0 10*3/uL (ref 0.0–0.1)
EOS%: 1.2 % (ref 0.0–7.0)
HGB: 13.4 g/dL (ref 11.6–15.9)
MCH: 36.2 pg — ABNORMAL HIGH (ref 25.1–34.0)
NEUT#: 4.2 10*3/uL (ref 1.5–6.5)
RDW: 13.4 % (ref 11.2–14.5)
WBC: 6.2 10*3/uL (ref 3.9–10.3)
lymph#: 1.2 10*3/uL (ref 0.9–3.3)

## 2009-01-04 LAB — MORPHOLOGY

## 2009-01-10 ENCOUNTER — Encounter: Payer: Self-pay | Admitting: Family Medicine

## 2009-01-18 ENCOUNTER — Encounter: Payer: Self-pay | Admitting: Family Medicine

## 2009-01-18 LAB — CBC WITH DIFFERENTIAL/PLATELET
BASO%: 0.5 % (ref 0.0–2.0)
Basophils Absolute: 0 10*3/uL (ref 0.0–0.1)
EOS%: 1.5 % (ref 0.0–7.0)
HCT: 42.8 % (ref 34.8–46.6)
LYMPH%: 26.1 % (ref 14.0–49.7)
MCH: 35 pg — ABNORMAL HIGH (ref 25.1–34.0)
MCHC: 32.9 g/dL (ref 31.5–36.0)
MONO#: 0.6 10*3/uL (ref 0.1–0.9)
NEUT%: 62.1 % (ref 38.4–76.8)
Platelets: 509 10*3/uL — ABNORMAL HIGH (ref 145–400)

## 2009-01-23 ENCOUNTER — Encounter: Payer: Self-pay | Admitting: Family Medicine

## 2009-01-24 ENCOUNTER — Ambulatory Visit: Payer: Self-pay | Admitting: Family Medicine

## 2009-01-24 LAB — CONVERTED CEMR LAB
Ketones, urine, test strip: NEGATIVE
Nitrite: NEGATIVE
Urobilinogen, UA: 0.2

## 2009-01-25 ENCOUNTER — Encounter: Payer: Self-pay | Admitting: Family Medicine

## 2009-01-25 LAB — CONVERTED CEMR LAB
ALT: 21 units/L (ref 0–35)
AST: 17 units/L (ref 0–37)
Albumin: 4.5 g/dL (ref 3.5–5.2)
Calcium: 8.9 mg/dL (ref 8.4–10.5)
Chloride: 101 meq/L (ref 96–112)
Creatinine, Ser: 0.7 mg/dL (ref 0.40–1.20)
Potassium: 4.2 meq/L (ref 3.5–5.3)
Sodium: 141 meq/L (ref 135–145)
Total CHOL/HDL Ratio: 2.9

## 2009-02-01 ENCOUNTER — Ambulatory Visit: Payer: Self-pay | Admitting: Oncology

## 2009-02-01 LAB — CBC WITH DIFFERENTIAL/PLATELET
BASO%: 0.4 % (ref 0.0–2.0)
EOS%: 1.4 % (ref 0.0–7.0)
HCT: 40.3 % (ref 34.8–46.6)
LYMPH%: 26.8 % (ref 14.0–49.7)
MCH: 35.6 pg — ABNORMAL HIGH (ref 25.1–34.0)
MCHC: 33.6 g/dL (ref 31.5–36.0)
MCV: 106 fL — ABNORMAL HIGH (ref 79.5–101.0)
MONO%: 9.3 % (ref 0.0–14.0)
NEUT%: 62.1 % (ref 38.4–76.8)
lymph#: 1.7 10*3/uL (ref 0.9–3.3)

## 2009-02-08 ENCOUNTER — Encounter: Payer: Self-pay | Admitting: Family Medicine

## 2009-02-27 LAB — CBC WITH DIFFERENTIAL/PLATELET
Basophils Absolute: 0 10*3/uL (ref 0.0–0.1)
Eosinophils Absolute: 0.1 10*3/uL (ref 0.0–0.5)
HCT: 41.7 % (ref 34.8–46.6)
HGB: 14.1 g/dL (ref 11.6–15.9)
NEUT#: 3.8 10*3/uL (ref 1.5–6.5)
RDW: 14.3 % (ref 11.2–14.5)
lymph#: 1.8 10*3/uL (ref 0.9–3.3)

## 2009-02-27 LAB — MORPHOLOGY: PLT EST: INCREASED

## 2009-03-09 ENCOUNTER — Ambulatory Visit: Payer: Self-pay | Admitting: Oncology

## 2009-03-10 ENCOUNTER — Emergency Department (HOSPITAL_COMMUNITY): Admission: EM | Admit: 2009-03-10 | Discharge: 2009-03-10 | Payer: Self-pay | Admitting: Family Medicine

## 2009-03-12 ENCOUNTER — Ambulatory Visit: Payer: Self-pay | Admitting: Family Medicine

## 2009-03-13 ENCOUNTER — Encounter: Payer: Self-pay | Admitting: Family Medicine

## 2009-03-13 LAB — CBC WITH DIFFERENTIAL/PLATELET
Basophils Absolute: 0 10*3/uL (ref 0.0–0.1)
EOS%: 1.4 % (ref 0.0–7.0)
Eosinophils Absolute: 0.1 10*3/uL (ref 0.0–0.5)
HCT: 42.6 % (ref 34.8–46.6)
HGB: 14.5 g/dL (ref 11.6–15.9)
MCH: 36.2 pg — ABNORMAL HIGH (ref 25.1–34.0)
MONO#: 0.8 10*3/uL (ref 0.1–0.9)
NEUT#: 4.7 10*3/uL (ref 1.5–6.5)
NEUT%: 67.2 % (ref 38.4–76.8)
RDW: 13.4 % (ref 11.2–14.5)
WBC: 7 10*3/uL (ref 3.9–10.3)
lymph#: 1.4 10*3/uL (ref 0.9–3.3)

## 2009-03-13 LAB — LACTATE DEHYDROGENASE: LDH: 150 U/L (ref 94–250)

## 2009-03-13 LAB — MORPHOLOGY

## 2009-03-13 LAB — COMPREHENSIVE METABOLIC PANEL
ALT: 20 U/L (ref 0–35)
Alkaline Phosphatase: 44 U/L (ref 39–117)
CO2: 26 mEq/L (ref 19–32)
Creatinine, Ser: 0.72 mg/dL (ref 0.40–1.20)
Glucose, Bld: 89 mg/dL (ref 70–99)
Total Bilirubin: 0.6 mg/dL (ref 0.3–1.2)

## 2009-03-13 LAB — URIC ACID: Uric Acid, Serum: 4.8 mg/dL (ref 2.4–7.0)

## 2009-03-26 ENCOUNTER — Ambulatory Visit: Payer: Self-pay | Admitting: Family Medicine

## 2009-04-10 ENCOUNTER — Ambulatory Visit: Payer: Self-pay | Admitting: Family Medicine

## 2009-04-10 ENCOUNTER — Ambulatory Visit: Payer: Self-pay | Admitting: Oncology

## 2009-04-10 DIAGNOSIS — M2010 Hallux valgus (acquired), unspecified foot: Secondary | ICD-10-CM

## 2009-04-10 HISTORY — DX: Hallux valgus (acquired), unspecified foot: M20.10

## 2009-04-10 LAB — CBC WITH DIFFERENTIAL/PLATELET
Basophils Absolute: 0 10*3/uL (ref 0.0–0.1)
Eosinophils Absolute: 0.1 10*3/uL (ref 0.0–0.5)
HCT: 42.2 % (ref 34.8–46.6)
LYMPH%: 22.4 % (ref 14.0–49.7)
MCV: 105.5 fL — ABNORMAL HIGH (ref 79.5–101.0)
MONO#: 0.9 10*3/uL (ref 0.1–0.9)
MONO%: 13.8 % (ref 0.0–14.0)
NEUT#: 3.9 10*3/uL (ref 1.5–6.5)
NEUT%: 62.3 % (ref 38.4–76.8)
Platelets: 476 10*3/uL — ABNORMAL HIGH (ref 145–400)
RBC: 4 10*6/uL (ref 3.70–5.45)
WBC: 6.3 10*3/uL (ref 3.9–10.3)

## 2009-04-10 LAB — MORPHOLOGY: PLT EST: INCREASED

## 2009-04-11 ENCOUNTER — Ambulatory Visit: Payer: Self-pay | Admitting: Sports Medicine

## 2009-05-08 LAB — CBC WITH DIFFERENTIAL/PLATELET
BASO%: 0.3 % (ref 0.0–2.0)
EOS%: 1.7 % (ref 0.0–7.0)
MCH: 36.1 pg — ABNORMAL HIGH (ref 25.1–34.0)
MCHC: 34.1 g/dL (ref 31.5–36.0)
MCV: 105.8 fL — ABNORMAL HIGH (ref 79.5–101.0)
MONO%: 13.1 % (ref 0.0–14.0)
RDW: 13.3 % (ref 11.2–14.5)
lymph#: 1.4 10*3/uL (ref 0.9–3.3)

## 2009-05-08 LAB — MORPHOLOGY: PLT EST: INCREASED

## 2009-05-17 ENCOUNTER — Encounter: Payer: Self-pay | Admitting: Family Medicine

## 2009-06-01 ENCOUNTER — Ambulatory Visit: Payer: Self-pay | Admitting: Oncology

## 2009-06-05 LAB — CBC WITH DIFFERENTIAL/PLATELET
Basophils Absolute: 0 10*3/uL (ref 0.0–0.1)
EOS%: 1.2 % (ref 0.0–7.0)
Eosinophils Absolute: 0.1 10*3/uL (ref 0.0–0.5)
HGB: 14 g/dL (ref 11.6–15.9)
LYMPH%: 15.3 % (ref 14.0–49.7)
MCH: 35.4 pg — ABNORMAL HIGH (ref 25.1–34.0)
MCV: 103.1 fL — ABNORMAL HIGH (ref 79.5–101.0)
MONO%: 16.6 % — ABNORMAL HIGH (ref 0.0–14.0)
NEUT#: 4.4 10*3/uL (ref 1.5–6.5)
Platelets: 484 10*3/uL — ABNORMAL HIGH (ref 145–400)
RBC: 3.94 10*6/uL (ref 3.70–5.45)
RDW: 13.6 % (ref 11.2–14.5)

## 2009-06-05 LAB — MORPHOLOGY

## 2009-06-07 ENCOUNTER — Ambulatory Visit: Payer: Self-pay | Admitting: Sports Medicine

## 2009-07-03 ENCOUNTER — Ambulatory Visit: Payer: Self-pay | Admitting: Oncology

## 2009-07-03 LAB — CBC WITH DIFFERENTIAL/PLATELET
BASO%: 0.1 % (ref 0.0–2.0)
Eosinophils Absolute: 0.1 10*3/uL (ref 0.0–0.5)
LYMPH%: 25.9 % (ref 14.0–49.7)
MONO#: 0.9 10*3/uL (ref 0.1–0.9)
NEUT#: 3.7 10*3/uL (ref 1.5–6.5)
Platelets: 546 10*3/uL — ABNORMAL HIGH (ref 145–400)
RBC: 3.86 10*6/uL (ref 3.70–5.45)
RDW: 13.9 % (ref 11.2–14.5)
WBC: 6.4 10*3/uL (ref 3.9–10.3)
lymph#: 1.6 10*3/uL (ref 0.9–3.3)

## 2009-07-03 LAB — MORPHOLOGY: PLT EST: INCREASED

## 2009-07-26 ENCOUNTER — Ambulatory Visit: Payer: Self-pay | Admitting: Sports Medicine

## 2009-07-31 LAB — CBC WITH DIFFERENTIAL/PLATELET
BASO%: 0.2 % (ref 0.0–2.0)
Basophils Absolute: 0 10*3/uL (ref 0.0–0.1)
HCT: 42.1 % (ref 34.8–46.6)
LYMPH%: 24.8 % (ref 14.0–49.7)
MCH: 35.2 pg — ABNORMAL HIGH (ref 25.1–34.0)
MCHC: 33.5 g/dL (ref 31.5–36.0)
MONO#: 0.5 10*3/uL (ref 0.1–0.9)
NEUT%: 66.2 % (ref 38.4–76.8)
Platelets: 505 10*3/uL — ABNORMAL HIGH (ref 145–400)
WBC: 6.2 10*3/uL (ref 3.9–10.3)

## 2009-07-31 LAB — MORPHOLOGY

## 2009-08-28 ENCOUNTER — Ambulatory Visit: Payer: Self-pay | Admitting: Oncology

## 2009-08-28 LAB — CBC WITH DIFFERENTIAL/PLATELET
EOS%: 1.4 % (ref 0.0–7.0)
MCH: 36.4 pg — ABNORMAL HIGH (ref 25.1–34.0)
MCV: 105.4 fL — ABNORMAL HIGH (ref 79.5–101.0)
MONO%: 13.2 % (ref 0.0–14.0)
RBC: 3.81 10*6/uL (ref 3.70–5.45)
RDW: 13.8 % (ref 11.2–14.5)

## 2009-08-28 LAB — MORPHOLOGY

## 2009-09-11 ENCOUNTER — Encounter: Payer: Self-pay | Admitting: Family Medicine

## 2009-11-02 ENCOUNTER — Ambulatory Visit: Payer: Self-pay | Admitting: Oncology

## 2009-11-06 ENCOUNTER — Encounter: Payer: Self-pay | Admitting: Family Medicine

## 2009-11-06 LAB — CBC WITH DIFFERENTIAL/PLATELET
Eosinophils Absolute: 0.1 10*3/uL (ref 0.0–0.5)
MONO#: 0.6 10*3/uL (ref 0.1–0.9)
MONO%: 8.7 % (ref 0.0–14.0)
NEUT#: 4 10*3/uL (ref 1.5–6.5)
RBC: 3.78 10*6/uL (ref 3.70–5.45)
RDW: 13.7 % (ref 11.2–14.5)
WBC: 6.5 10*3/uL (ref 3.9–10.3)
lymph#: 1.8 10*3/uL (ref 0.9–3.3)

## 2009-11-06 LAB — COMPREHENSIVE METABOLIC PANEL
Albumin: 4 g/dL (ref 3.5–5.2)
Alkaline Phosphatase: 44 U/L (ref 39–117)
CO2: 28 mEq/L (ref 19–32)
Glucose, Bld: 154 mg/dL — ABNORMAL HIGH (ref 70–99)
Potassium: 4.4 mEq/L (ref 3.5–5.3)
Sodium: 138 mEq/L (ref 135–145)
Total Protein: 6.5 g/dL (ref 6.0–8.3)

## 2009-11-06 LAB — URIC ACID: Uric Acid, Serum: 4.9 mg/dL (ref 2.4–7.0)

## 2009-11-27 ENCOUNTER — Encounter: Payer: Self-pay | Admitting: Family Medicine

## 2009-11-27 ENCOUNTER — Ambulatory Visit: Payer: Self-pay | Admitting: Family Medicine

## 2009-11-27 DIAGNOSIS — N3941 Urge incontinence: Secondary | ICD-10-CM

## 2009-11-27 HISTORY — DX: Urge incontinence: N39.41

## 2009-12-18 ENCOUNTER — Ambulatory Visit: Payer: Self-pay

## 2009-12-25 ENCOUNTER — Ambulatory Visit (HOSPITAL_COMMUNITY)
Admission: RE | Admit: 2009-12-25 | Discharge: 2009-12-25 | Payer: Self-pay | Source: Home / Self Care | Attending: Family Medicine | Admitting: Family Medicine

## 2010-01-06 HISTORY — PX: OTHER SURGICAL HISTORY: SHX169

## 2010-01-06 HISTORY — PX: MOHS SURGERY: SHX181

## 2010-01-16 ENCOUNTER — Ambulatory Visit: Payer: Self-pay | Admitting: Oncology

## 2010-01-18 ENCOUNTER — Encounter: Payer: Self-pay | Admitting: Family Medicine

## 2010-01-18 ENCOUNTER — Ambulatory Visit
Admission: RE | Admit: 2010-01-18 | Discharge: 2010-01-18 | Payer: Self-pay | Source: Home / Self Care | Attending: Family Medicine | Admitting: Family Medicine

## 2010-01-18 ENCOUNTER — Ambulatory Visit (HOSPITAL_COMMUNITY)
Admission: RE | Admit: 2010-01-18 | Discharge: 2010-01-18 | Payer: Self-pay | Source: Home / Self Care | Admitting: Family Medicine

## 2010-01-18 ENCOUNTER — Other Ambulatory Visit: Payer: Self-pay

## 2010-01-18 DIAGNOSIS — R42 Dizziness and giddiness: Secondary | ICD-10-CM | POA: Insufficient documentation

## 2010-01-18 DIAGNOSIS — H8110 Benign paroxysmal vertigo, unspecified ear: Secondary | ICD-10-CM | POA: Insufficient documentation

## 2010-01-18 HISTORY — DX: Dizziness and giddiness: R42

## 2010-01-18 LAB — CBC WITH DIFFERENTIAL/PLATELET
BASO%: 0.6 % (ref 0.0–2.0)
Basophils Absolute: 0 10*3/uL (ref 0.0–0.1)
EOS%: 1.4 % (ref 0.0–7.0)
Eosinophils Absolute: 0.1 10*3/uL (ref 0.0–0.5)
HCT: 38.9 % (ref 34.8–46.6)
HGB: 13.3 g/dL (ref 11.6–15.9)
LYMPH%: 28.4 % (ref 14.0–49.7)
MCH: 36.1 pg — ABNORMAL HIGH (ref 25.1–34.0)
MCHC: 34.2 g/dL (ref 31.5–36.0)
MCV: 105.3 fL — ABNORMAL HIGH (ref 79.5–101.0)
MONO#: 0.7 10*3/uL (ref 0.1–0.9)
MONO%: 11.6 % (ref 0.0–14.0)
NEUT#: 3.6 10*3/uL (ref 1.5–6.5)
NEUT%: 58 % (ref 38.4–76.8)
Platelets: 631 10*3/uL — ABNORMAL HIGH (ref 145–400)
RBC: 3.69 10*6/uL — ABNORMAL LOW (ref 3.70–5.45)
RDW: 13.9 % (ref 11.2–14.5)
WBC: 6.2 10*3/uL (ref 3.9–10.3)
lymph#: 1.7 10*3/uL (ref 0.9–3.3)

## 2010-01-18 LAB — CONVERTED CEMR LAB
Bilirubin Urine: NEGATIVE
Glucose, Bld: 99 mg/dL (ref 70–99)
Glucose, Urine, Semiquant: NEGATIVE
Ketones, urine, test strip: NEGATIVE
Nitrite: NEGATIVE
Potassium: 3.5 meq/L (ref 3.5–5.3)
Protein, U semiquant: NEGATIVE
Sodium: 139 meq/L (ref 135–145)
Specific Gravity, Urine: 1.015
Urobilinogen, UA: 0.2
WBC Urine, dipstick: NEGATIVE
pH: 7

## 2010-01-18 LAB — MORPHOLOGY: PLT EST: INCREASED

## 2010-01-21 ENCOUNTER — Telehealth: Payer: Self-pay | Admitting: Family Medicine

## 2010-02-01 ENCOUNTER — Ambulatory Visit: Admission: RE | Admit: 2010-02-01 | Discharge: 2010-02-01 | Payer: Self-pay | Source: Home / Self Care

## 2010-02-05 NOTE — Assessment & Plan Note (Signed)
Summary: f/u,df   Vital Signs:  Patient profile:   77 year old female Height:      63.5 inches Weight:      131 pounds BMI:     22.92 Pulse rate:   70 / minute BP sitting:   130 / 78  (left arm)  Vitals Entered By: Arlyss Repress CMA, (April 10, 2009 9:58 AM) CC: f/up HTN Is Patient Diabetic? No Pain Assessment Patient in pain? no        Primary Care Provider:  Paula Compton MD  CC:  f/up HTN.  History of Present Illness: Jill Myers comes in today for BP check, and a few other issues.   Her BP log from home shows most of her pressures running in the 130-140 range.  She is back to taking the HCTZ.  No side effects related to starting this back.  The increased urination that she had ascribed to the HCTZ has been alleviated by reducing her coffee intake from 2 to 1 cup daily.   Sees Dr Juanda Chance (GI) for history diverticulitis (5-6 yrs ago), was told to take Citrucel 2 tabs every night.  Has formed BMs once daily, then several smaller watery nonpainful and nonbloody stools.  Takes stool softener as well.  Would like recommendation to reduce amount of Citrucel she is taking.   Seeing Dermatology at [facility], had Moh's surgery on forehead and is in follow-up with them.   Continues with bilateral foot pain, which limits her recreational walking.  Had seen Dr Darrick Penna about this in the past, had orthotics made a long time ago and still uses.  Went to [facility] ("in a moment of weakness") and had an evaluation by their "arch specialist".  Was fitted for a pair of orthotics and paid over $300 for them.  Is increasing the amount of time she uses them by 15 min/day; is up to 2-1/2 hrs daily now.  Would like Dr Darrick Penna' opinion about the orthotics she has.   Is traveling to [place] to see her grandchildren next week. Is overdue for Td booster.   Habits & Providers  Alcohol-Tobacco-Diet     Tobacco Status: never  Current Medications (verified): 1)  Klonopin 0.5 Mg Tabs (Clonazepam) 2)   Hydroxyurea 500 Mg  Caps (Hydroxyurea) .[name].. 1 By Mouth Qd 3)  Caltrate 600+d Plus 600-400 Mg-Unit  Tabs (Calcium Carbonate-Vit D-Min) .[name].. 1 By Mouth Bid 4)  Adult Aspirin Low Strength 81 Mg  Tbdp (Aspirin) .[name].. 1 By Mouth Qd 5)  Klonopin 0.5 Mg Tabs (Clonazepam) .... Take One Tablet At Bedtime 6)  Zostavax 52778 Unt/0.65ml Solr (Zoster Vaccine Live) .... Once 7)  Hydrochlorothiazide 25 Mg Tabs (Hydrochlorothiazide) .... Sig Take 1 Tab By Mouth Once Daily  Allergies (verified): 1)  ! Septra Ds (Sulfamethoxazole-Trimethoprim)  Social History: Smoking Status:  never  Physical Exam  General:  Well-developed,well-nourished,in no acute distress; alert,appropriate and cooperative throughout examination Mouth:  Oral mucosa and oropharynx without lesions or exudates.  Teeth in good repair. Neck:  No deformities, masses, or tenderness noted. Lungs:  Normal respiratory effort, chest expands symmetrically. Lungs are clear to auscultation, no crackles or wheezes. Heart:  Normal rate and regular rhythm. S1 and S2 normal without gallop, murmur, click, rub or other extra sounds. Extremities:  No pedal edema.  No skin breakdown. Sensation grossly intact.  Bunions worse on L than R. Tenderness to palpate along the L 1st and 2nd MTP joint (base of 1 and 2 digits).  Somewhat  flattened arches when standing barefoot.   Impression & Recommendations:  Problem # 1:  HALLUX VALGUS, ACQUIRED (ICD-735.0)  Patient continues to have foot pain.  Form for DME completed for [facility] eval (she brings the form with her today).  She is advised to see Dr Darrick Penna, will contact his office herself.   Orders: [facility]- Est Level  3 (60630)  Problem # 2:  ESSENTIAL HYPERTENSION, BENIGN (ICD-401.1)  In acceptable range for septagenarian, on minimal meds.  To continue HCTZ.   Her updated medication list for this problem includes:    Hydrochlorothiazide 25 Mg Tabs (Hydrochlorothiazide) ..... Sig take 1 tab by mouth once  daily  Orders: [facility]- Est Level  3 (16010)  Problem # 3:  DIVERTICULOSIS OF COLON (ICD-562.10)  May reduce Citrucel to 1 tab every other day, as she has already decreased to once daily and has had some reduction in the amount of stool.  She is to titrate the Citrucel as necessary.   Orders: [facility]- Est Level  3 (93235)  Problem # 4:  Preventive Health Care (ICD-V70.0) Td booster today.   Complete Medication List: 1)  Klonopin 0.5 Mg Tabs (Clonazepam) 2)  Hydroxyurea 500 Mg Caps (Hydroxyurea) .[name].. 1 by mouth qd 3)  Caltrate 600+d Plus 600-400 Mg-unit Tabs (Calcium carbonate-vit d-min) .[name].. 1 by mouth bid 4)  Adult Aspirin Low Strength 81 Mg Tbdp (Aspirin) .[name].. 1 by mouth qd 5)  Klonopin 0.5 Mg Tabs (Clonazepam) .... Take one tablet at bedtime 6)  Zostavax 57322 Unt/0.96ml Solr (Zoster vaccine live) .... Once 7)  Hydrochlorothiazide 25 Mg Tabs (Hydrochlorothiazide) .... Sig take 1 tab by mouth once daily  Other Orders: TD Toxoids IM 7 YR + (02542) Admin 1st Vaccine (70623) Admin 1st Vaccine [facility]) [phone])   Prevention & Chronic Care Immunizations   Influenza vaccine: Not documented    Tetanus booster: 04/10/2009: Td    Pneumococcal vaccine: Pneumovax (Medicare)  (02/22/2008)   Pneumococcal vaccine due: None    H. zoster vaccine: 10/12/2007: given  Colorectal Screening   Hemoccult: Not documented    Colonoscopy: Not documented  Other Screening   Pap smear: Done.  (01/07/1988)   Pap smear due: 01/06/1989    Mammogram: Done.  (09/06/1996)   Mammogram due: 09/06/1997    DXA bone density scan: Not documented   Smoking status: never  (04/10/2009)  Lipids   Total Cholesterol: 179  (01/24/2009)   LDL: 89  (01/24/2009)   LDL Direct: Not documented   HDL: 61  (01/24/2009)   Triglycerides: 144  (01/24/2009)    SGOT (AST): 17  (01/24/2009)   SGPT (ALT): 21  (01/24/2009)   Alkaline phosphatase: 45  (01/24/2009)   Total bilirubin: 0.5  (01/24/2009)  Hypertension    Last Blood Pressure: 130 / 78  (04/10/2009)   Serum creatinine: 0.70  (01/24/2009)   Serum potassium 4.2  (01/24/2009)  Self-Management Support :   Personal Goals (by the next clinic visit) :      Personal blood pressure goal: 140/90  (12/20/2008)     Personal LDL goal: 130  (12/20/2008)    Hypertension self-management support: Not documented    Lipid self-management support: Not documented    Tetanus/Td Vaccine    Vaccine Type: Td    Site: left deltoid    Mfr: Sanofi Pasteur    Dose: 0.5 ml    Route: IM    Given by: Arlyss Repress CMA,    Exp. Date: 11/21/2010    Lot #: [id number]  VIS given: 11/24/06 version given April 10, 2009.

## 2010-02-05 NOTE — Miscellaneous (Signed)
   Clinical Lists Changes  Observations: Added new observation of HZOSTERNEXT: Not Indicated (01/10/2009 16:29) Added new observation of DM PROGRESS: N/A (01/10/2009 16:29) Added new observation of DM FSREVIEW: N/A (01/10/2009 16:29) Added new observation of ZOSTAVAX: given (10/12/2007 16:31)      Prevention & Chronic Care Immunizations   Influenza vaccine: Not documented    Tetanus booster: Not documented    Pneumococcal vaccine: Pneumovax (Medicare)  (02/22/2008)   Pneumococcal vaccine due: None    H. zoster vaccine: 10/12/2007: given  Colorectal Screening   Hemoccult: Not documented    Colonoscopy: Not documented  Other Screening   Pap smear: Done.  (01/07/1988)   Pap smear due: 01/06/1989    Mammogram: Done.  (09/06/1996)   Mammogram due: 09/06/1997    DXA bone density scan: Not documented   Smoking status: never  (12/20/2008)  Lipids   Total Cholesterol: 180  (01/05/2007)   LDL: 98  (01/05/2007)   LDL Direct: Not documented   HDL: 49  (01/05/2007)   Triglycerides: 163  (01/05/2007)    SGOT (AST): Not documented   SGPT (ALT): Not documented   Alkaline phosphatase: Not documented   Total bilirubin: Not documented  Hypertension   Last Blood Pressure: 155 / 73  (12/20/2008)   Serum creatinine: 0.83  (02/22/2008)   Serum potassium 4.7  (02/22/2008)  Self-Management Support :   Personal Goals (by the next clinic visit) :      Personal blood pressure goal: 140/90  (12/20/2008)     Personal LDL goal: 130  (12/20/2008)    Hypertension self-management support: Not documented    Lipid self-management support: Not documented    Herpes Zoster Result Date:  10/12/2007 Herpes Zoster Result:  given Herpes Zoster Next Due:  Not Indicated

## 2010-02-05 NOTE — Assessment & Plan Note (Signed)
Summary: ORTHOTICS,[facility]   Primary Provider:  Paula Compton MD   History of Present Illness: Mertis comes in for new orthotics for bilateral foot pain  Patient's previous orthotics 75 and 77 years old are worn out - one with base cracked She has continued walking but having increasing bilateral long arch pain. No swelling or bruising. No other complaints. Tried padding old orthotics last visit and this did not help - took off metatarsal pads and 1st ray posts on one pair shifted.  These did not feel comfortable.  Allergies (verified): 1)  ! Septra Ds (Sulfamethoxazole-Trimethoprim)  Physical Exam  General:  Well-developed,well-nourished,in no acute distress; alert,appropriate and cooperative throughout examination Msk:  Bilateral feet: Excellent dorsiflexion but limited plantar flexion of bilateral 1st MTs. Cavus feet Some collapse bilateral transverse arch. Calluses 2nd and 3rd MTs No leg length inequality.   Impression & Recommendations:  Problem # 1:  FOOT PAIN, BILATERAL (ICD-729.5) Assessment Deteriorated  New custom orthotics today.  Will try to fit in dress shoes and if cannot may come back for dress orthotics.  Patient was fitted for a : standard, cushioned, semi-rigid orthotic. The orthotic was heated and afterward the patient stood on the orthotic blank positioned on the orthotic stand. The patient was positioned in subtalar neutral position and 10 degrees of ankle dorsiflexion in a weight bearing stance. After completion of molding, a stable base was applied to the orthotic blank. The blank was ground to a stable position for weight bearing. Size: 7 red cambray Base: blue med density EVA Posting: 1st ray posts bilaterally Additional orthotic padding: None Total prep time: 40 minutes  Orders: [employer], each unit (L3002)  Problem # 2:  METATARSALGIA (ICD-726.70)  Will hold off on metatarsal pads - in other orthotics these increased pain and were not  comfortable.  Orders: [employer], each unit [phone])  Complete Medication List: 1)  Klonopin 0.5 Mg Tabs (Clonazepam) 2)  Hydroxyurea 500 Mg Caps (Hydroxyurea) .[name].. 1 by mouth qd 3)  Caltrate 600+d Plus 600-400 Mg-unit Tabs (Calcium carbonate-vit d-min) .[name].. 1 by mouth bid 4)  Adult Aspirin Low Strength 81 Mg Tbdp (Aspirin) .[name].. 1 by mouth qd 5)  Klonopin 0.5 Mg Tabs (Clonazepam) .... Take one tablet at bedtime 6)  Zostavax 98119 Unt/0.16ml Solr (Zoster vaccine live) .... Once 7)  Hydrochlorothiazide 25 Mg Tabs (Hydrochlorothiazide) .... Sig take 1 tab by mouth once daily

## 2010-02-05 NOTE — Miscellaneous (Signed)
Summary: labs   Clinical Lists Changes rec'd labs from cancer center. 3 abnormals in blood count. to pcp.Golden Circle RN  January 23, 2009 3:57 PM

## 2010-02-05 NOTE — Assessment & Plan Note (Signed)
Summary: Initial Medicare Wellness Visit   Vital Signs:  Patient profile:   77 year old female Height:      63.5 inches Weight:      127 pounds BMI:     22.22  Primary Care Provider:  Paula Compton MD   History of Present Illness: Patient came in today for Initial Medicare Wellness Visit.  She was contacted by her enrollment group to have this done.  She describes her biggest issue is deciding what to do with her life.  Her husband died one year ago, she still lives in a large 8 room house, she has one son in [place] and one daughter in [place].  Her daughter is most supportive yet lives in [place] and has done so since college.  Her son prefers for her to move to [place], to be near his nuclear family.  She wants to continue to live in [place] where she has friends and considers to be her home.  In the case of emergency she can rely on neighbors, and friends for small things, she considers [facility] her medical home and would call here immediately, and for complicated problems her son is her durable POA.  She is anticipating selling her home, and moving to either a life care community or a retirement independent living situation.  She has problems with her feet and her legs.  She sees Dr. Darrick Penna for this, wears inserts in her shoes, and takes ibuprofen as needed in bursts for her arthritic pain.  She has not fallen.  She goes to exercise three times a week to keep strong.  She had 5 years of Fosamax for a T score of -2.0 2 years ago.  She is independent in all ADLs and IADLs, she denies problems with memory and was able to remember 3 objects in 5 minutes with distracting conversation.  She works part time at [employer], and is a retired Runner, broadcasting/film/video.  Her home is equiped with smoke alarms, and has equipment in the bathrooms left over from the chronic illness of her husband.  She drives and always wears seat belts.  Her son is her POA for decisions, she has talked with both children about her wishes at the end  of life.  She placed her husband in Hospice care at the end, she feels in some ways too late, letting him suffer longer then needed in hopes that he would get better.  She admits to urge incontinence that sometimes interferes with her life.  She knows that she is at risk for depression.  Habits & Providers  Alcohol-Tobacco-Diet     Alcohol drinks/day: <1     Tobacco Status: quit > 6 months     Diet Comments: High sodium, using frozen dinners     Diet Counseling: to improve diet; diet is suboptimal  Exercise-Depression-Behavior     Does Patient Exercise: yes     Exercise Counseling: not indicated; exercise is adequate     Type of exercise: class     Exercise (avg: min/session): 30-60     Times/week: 3     Have you felt down or hopeless? no     Have you felt little pleasure in things? no     Depression Counseling: not indicated; screening negative for depression     Seat Belt Use: always     Sun Exposure: infrequent     Oncologist: [facility]     Ophthalmologist: [place]  Comments: Pharmacy-[facility] [place]  Allergies: 1)  !  Septra Ds (Sulfamethoxazole-Trimethoprim)  Social History: Smoking Status:  quit > 6 months Does Patient Exercise:  yes Seat Belt Use:  always Sun Exposure-Excessive:  infrequent   Impression & Recommendations:  Problem # 1:  URINARY INCONTINENCE, URGE, MILD (ICD-788.31) She will follow up with Dr. Wynona Neat to discuss as needed medicaions for when she is out.  Discussed behavioal straegies to control urge pattern.  Problem # 2:  ESSENTIAL HYPERTENSION, BENIGN (ICD-401.1) Counseled to limited prepared frozen foods as they are high in sodium and will contribute to HTN and also interfere with effectiveness of thiazide dieuretic. Her updated medication list for this problem includes:    Hydrochlorothiazide 25 Mg Tabs (Hydrochlorothiazide) ..... Sig take 1 tab by mouth once daily  Problem # 3:  METATARSALGIA (ICD-726.70) Wearing inserts fabricated by  [facility], knows that this is a chronic issue  Problem # 4:  THROMBOCYTOSIS, PRIMARY (ICD-289.9) Sees hematology ([facility]), worries that this could turn into leukemia  Problem # 5:  PATELLO FEMORAL STRESS SYNDROME (ICD-719.46) Uses NSAID bursts, cautioned on risk of bleeding and renal complications with high frequent usage. Her updated medication list for this problem includes:    Adult Aspirin Low Strength 81 Mg Tbdp (Aspirin) .[name]... 1 by mouth qd  Problem # 6:  DIVERTICULOSIS OF COLON (ICD-562.10) Keep fiber up in diet  Problem # 7:  Preventive Health Care (ICD-V70.0)  Up to date in most areas, all were reviewed.  We will schedule a mammogram and bone density in Feburary as it was done 2 years ago this coming [place].  She will increase her vitamin D supplement to an additonal 1000 mg daily.  Last level at 30, one year ago.  She will make an apt with Dr. Mauricio Po to discuss ongoing health issues and with Dr, Darrick Penna for her painful knee and feet.  Orders: Provider Misc Charge- [facility] (Misc)  Complete Medication List: 1)  Klonopin 0.5 Mg Tabs (Clonazepam) .[name].. 1 by mouth at bedtime quant suff 3 months 2)  Hydroxyurea 500 Mg Caps (Hydroxyurea) .[name].. 1 by mouth qd 3)  Caltrate 600+d Plus 600-400 Mg-unit Tabs (Calcium carbonate-vit d-min) .[name].. 1 by mouth bid 4)  Adult Aspirin Low Strength 81 Mg Tbdp (Aspirin) .[name].. 1 by mouth qd 5)  Hydrochlorothiazide 25 Mg Tabs (Hydrochlorothiazide) .... Sig take 1 tab by mouth once daily 6)  Daily-vitamin Tabs (Multiple vitamin) 7)  Biotin 300 Mcg Tabs (Biotin) 8)  Cvs Glucosamine-chondroitin 500-400 Mg Tabs (Glucosamine-chondroitin)  Patient Instructions: 1)  Follow up apt. with Dr. Mauricio Po in the next few months to discuss medical issues 2)  Reduce sodium in diet and try to eat more fresh 3)  Continue to exercise and stay strong, this will prevent falls 4)  We will schedule your mammogram and your new schedule will be very 2 years for a few more times; at the  same time you will have your 2 year bone density scan 5)  You may add an additional Vitamin D 1000 mg daily to your supplements 6)  You tetnus shot will not be due until 2012 unless you injure youself in more than 5 years.   Your pneumonia and shingles shots are only given once, and you have recieved them both. 7)  It was really nice to spend this time with you and you may return yearly for subsequent wellness visits.   Orders Added: 1)  Provider Misc Charge- [facility] [Misc]   Immunization History:  Influenza Immunization History:    Influenza:  historical (11/12/2009)  Immunization History:  Influenza Immunization History:    Influenza:  Historical (11/12/2009)   Prevention & Chronic Care Immunizations   Influenza vaccine: Historical  (11/12/2009)    Tetanus booster: 04/10/2009: Td    Pneumococcal vaccine: Pneumovax (Medicare)  (02/22/2008)   Pneumococcal vaccine due: None    H. zoster vaccine: 10/12/2007: given  Colorectal Screening   Hemoccult: Not documented   Hemoccult action/deferral: Not indicated  (11/27/2009)    Colonoscopy: divertivulosis  (11/09/2006)  Other Screening   Pap smear: Done.  (01/07/1988)   Pap smear action/deferral: Not indicated-other  (11/27/2009)   Pap smear due: 01/06/1989    Mammogram: Done.  (09/06/1996)   Mammogram action/deferral: Deferred-2 yr interval  (11/27/2009)   Mammogram due: 09/06/1997    DXA bone density scan: T  - 0.2  (02/08/2007)   DXA bone density action/deferral: Ordered  (11/27/2009)   Smoking status: quit > 6 months  (11/27/2009)  Lipids   Total Cholesterol: 179  (01/24/2009)   LDL: 89  (01/24/2009)   LDL Direct: Not documented   HDL: 61  (01/24/2009)   Triglycerides: 144  (01/24/2009)    SGOT (AST): 17  (01/24/2009)   SGPT (ALT): 21  (01/24/2009)   Alkaline phosphatase: 45  (01/24/2009)   Total bilirubin: 0.5  (01/24/2009)    Lipid flowsheet reviewed?: Yes   Progress toward LDL goal: At  goal  Hypertension   Last Blood Pressure: 148 / 78  (07/26/2009)   Serum creatinine: 0.70  (01/24/2009)   Serum potassium 4.2  (01/24/2009)    Hypertension flowsheet reviewed?: Yes   Progress toward BP goal: At goal  Self-Management Support :   Personal Goals (by the next clinic visit) :      Personal blood pressure goal: 140/90  (12/20/2008)     Personal LDL goal: 130  (12/20/2008)    Hypertension self-management support: Not documented    Lipid self-management support: Not documented        Colonoscopy  Procedure date:  11/09/2006  Findings:      divertivulosis  Bone Density  Procedure date:  02/08/2007  Findings:      T  - 0.2   Colonoscopy  Procedure date:  11/09/2006  Findings:      divertivulosis  Bone Density  Procedure date:  02/08/2007  Findings:      T  - 0.2

## 2010-02-05 NOTE — Consult Note (Signed)
Summary: [facility] Hem/Onc  [facility] Hem[facility]Onc   Imported By: De Nurse 05/25/2009 14:18:09  _____________________________________________________________________  External Attachment:    Type:   Image     Comment:   External Document

## 2010-02-05 NOTE — Letter (Signed)
Summary: Wellness VIsit Letter  [facility]  [address]   [place], [place] [address]   Phone: [phone]  Fax: [phone]    05/17/2009  Jill Myers [address] [place], [place]  [address]  Dear Ms. Shirk,  We are happy to let you know that since you are covered under Medicare you are able to have a FREE visit at the [facility] to discuss your HEALTH. This is a new benefit for Medicare.  There will be no co-payment.  At this visit you will meet with [name] an expert in wellness and the nurse practitioner at our clinic.  At this visit we will discuss ways to keep you healthy and feeling well.  This visit will not replace your regular doctor visit and we cannot refill medications.  We may schedule future blood work, give shots if needed, or schedule tests to look for hidden problems.   You will need to plan to be here at least one hour to talk about your medical history, your current status, review all of your medications, and discuss your future plans for your health.  This information will be entered into your record for your doctor to have and review.  If you are interested in staying healthy, this type of visit can help.  Please call the office at: [phone], to schedule a "Medicare Wellness Visit".  The day of the visit you should bring in all of your medications, including any vitamins, herbs, over the counter products you take.  Make a list of all the other doctors that you see, so we know who they are. If you have any other health documents please bring them.   Sincerely,   [name] NP  Appended Document: Wellness VIsit Letter mailed.

## 2010-02-05 NOTE — Assessment & Plan Note (Signed)
Summary: BP check/ls  Patient in for BP check. BP checked manually using regular cuff after resting. BP bilaterally 130/80 pulse 76. readings at home have ranged from 160-140 systolic and 67-88 dystolic . she has a follow up appointment with Dr. Mauricio Po on 04/10/2009.  she did restart HCTZ after last nurse visit on 03.07/2009. Theresia Lo RN  March 26, 2009 11:24 AM  Nurse Visit   Allergies: 1)  ! Septra Ds (Sulfamethoxazole-Trimethoprim)  Orders Added: 1)  No Charge Patient Arrived (NCPA0) [NCPA0]

## 2010-02-05 NOTE — Assessment & Plan Note (Signed)
Summary: ORTHOTICS,[facility]   Vital Signs:  Patient profile:   77 year old female Weight:      133.25 pounds Pulse rate:   80 / minute BP sitting:   148 / 78  (right arm)  Vitals Entered By: Terese Door (July 26, 2009 9:17 AM) CC: orthotics   Primary Provider:  Paula Compton MD  CC:  orthotics.  History of Present Illness: Jill Myers comes back to try a dress orthotic for her shoes note she has done very well w custom orthotics for her exercise shoes has persistent bilat foot pain and PF sxs develop if she does not use these for her work she needs casual shoes now pain is limiting and so wants to try something to lessen this  Allergies: 1)  ! Septra Ds (Sulfamethoxazole-Trimethoprim)  Physical Exam  General:  Well-developed,well-nourished,in no acute distress; alert,appropriate and cooperative throughout examination Msk:  Bilateral feet: Excellent dorsiflexion but limited plantar flexion of bilateral 1st MTs. Cavus feet Some collapse bilateral transverse arch. Calluses 2nd and 3rd MTs No leg length inequality.   Impression & Recommendations:  Problem # 1:  FOOT PAIN, BILATERAL (ICD-729.5) Patient was fitted for a standard, cushioned, semi-rigid orthotic.  The orthotic was heated and the patient stood on the orthotic blank positioned on the orthotic stand. The patient was positioned in subtalar neutral position and 10 degrees of ankle dorsiflexion in a weight bearing stance. After completion of molding a stable based was applied to the orthotic blank.   The blank was ground to a stable position for weight bearing. size 8 dress thin material base  blue foam padding posting none additional orthotic padding  none  these seemed to give her some comfort  will give these a trial and see if we need to adjust  walking gait is less pronated in these  Problem # 2:  HALLUX VALGUS, ACQUIRED (ICD-735.0) no posting added to these but will follow  Problem # 3:  METATARSALGIA  (ICD-726.70) not currently using pads as they crowd her feet will add these if foreffet become worse  Complete Medication List: 1)  Klonopin 0.5 Mg Tabs (Clonazepam) 2)  Hydroxyurea 500 Mg Caps (Hydroxyurea) .[name].. 1 by mouth qd 3)  Caltrate 600+d Plus 600-400 Mg-unit Tabs (Calcium carbonate-vit d-min) .[name].. 1 by mouth bid 4)  Adult Aspirin Low Strength 81 Mg Tbdp (Aspirin) .[name].. 1 by mouth qd 5)  Klonopin 0.5 Mg Tabs (Clonazepam) .... Take one tablet at bedtime 6)  Zostavax [address] Unt/0.41ml Solr (Zoster vaccine live) .... Once 7)  Hydrochlorothiazide 25 Mg Tabs (Hydrochlorothiazide) .... Sig take 1 tab by mouth once daily

## 2010-02-05 NOTE — Assessment & Plan Note (Signed)
Summary: f/u bp/ eo   Vital Signs:  Patient profile:   77 year old female Height:      63.5 inches Weight:      126 pounds BMI:     22.05 Temp:     98.2 degrees F oral Pulse rate:   80 / minute BP sitting:   144 / 82  (right arm) Cuff size:   regular  Vitals Entered By: Tessie Fass CMA (January 24, 2009 8:42 AM) CC: F/U BP Is Patient Diabetic? No Pain Assessment Patient in pain? no        Primary Care Provider:  Paula Compton MD  CC:  F/U BP.  History of Present Illness: Mrs. Jill Myers comes in today for repeat check on her BP since changing off lisinopril and onto HCTZ at last visit.  She does not have the cough anymore.  She has started to have urinary frequency since starting the diuretic, occasionally with difficulty controlling the urine when on her way home from work.   Denies chest pains or palpitations.  No rash or pruritus since starting the HCTZ despite report of sulfa allergy.  Since stopping the fosamax, she has noticed an improvement in her GI distress.  Conveys her concerns regarding adverse effects of fosamax. Knows friends who have had fractures while on Fosamax.   Is going to grief counselor, is part of a group that are sorted by age.  She is the youngest one in her group of 8 people.  Is living alone for the first time in her life.  Considering a move to [place], [place], where her son lives.  Putting her house up for sale.  Seen by [facility], Recent report of CBC from 01/18/2009 showing platelets 509K; previous readings 01/04/2009 at 573K, 11/07/2008 at 542K.  Continuing same dose hydroxyrurea.  Habits & Providers  Alcohol-Tobacco-Diet     Tobacco Status: quit  Current Medications (verified): 1)  Klonopin 0.5 Mg Tabs (Clonazepam) 2)  Hydroxyurea 500 Mg  Caps (Hydroxyurea) .[name].. 1 By Mouth Qd 3)  Caltrate 600+d Plus 600-400 Mg-Unit  Tabs (Calcium Carbonate-Vit D-Min) .[name].. 1 By Mouth Bid 4)  Adult Aspirin Low Strength 81 Mg  Tbdp (Aspirin) .[name].. 1 By Mouth  Qd 5)  Klonopin 0.5 Mg Tabs (Clonazepam) .... Take One Tablet At Bedtime 6)  Zostavax [address] Unt/0.57ml Solr (Zoster Vaccine Live) .... Once 7)  Hydrochlorothiazide 25 Mg Tabs (Hydrochlorothiazide) .... Sig Take 1 Tab By Mouth Once Daily  Allergies (verified): 1)  ! Septra Ds (Sulfamethoxazole-Trimethoprim)  Social History: works as Psychologist, educational; retired Engineer, site; lives with husband/ 2 children / 1 in [place]; non smoker  January 24, 2009: Widowed, considering move to [place] VASmoking Status:  quit  Physical Exam  General:  Well-developed,well-nourished,in no acute distress; alert,appropriate and cooperative throughout examination Mouth:  Oral mucosa and oropharynx without lesions or exudates.  Teeth in good repair. Neck:  No deformities, masses, or tenderness noted. Lungs:  Normal respiratory effort, chest expands symmetrically. Lungs are clear to auscultation, no crackles or wheezes. Heart:  Normal rate and regular rhythm. S1 and S2 normal without gallop, murmur, click, rub or other extra sounds. Abdomen:  Bowel sounds positive,abdomen soft and non-tender without masses, organomegaly or hernias noted. Extremities:  no pedal edema bilat.   Neurologic:  gait normal.     Impression & Recommendations:  Problem # 1:  ESSENTIAL HYPERTENSION, BENIGN (ICD-401.1) Patient with reasonable BP control in a senior.  Would continue current regimen, however I believe the  diuretic may be contributing to her urinary frequency.  I have asked her to hold the HCTZ and come for RN check of BP in the coming 2 weeks.  If SBP is less than 160, I would recommend holding the HCTZ for good.   Her updated medication list for this problem includes:    Hydrochlorothiazide 25 Mg Tabs (Hydrochlorothiazide) ..... Sig take 1 tab by mouth once daily  Orders: Lipid-[facility] ([address]) Comp Met-[facility] ([address]-91478) Urinalysis-[facility] (00000) [facility]- Est  Level 4 (29562)  Problem # 2:  URINARY FREQUENCY  (ICD-788.41) Likely secondary to HCTZ.  Will hold. Collect a urine for UA today.  If frequency doesn't improve when off HCTZ, then consider workup. Orders: Urinalysis-[facility] (00000) [facility]- Est  Level 4 ([address])  Problem # 3:  FLATULENCE ERUCTATION AND GAS PAIN (ICD-787.3) Improved off Fosamax.  Discussed risks and benefits.  To get ROI from Dr Juanda Chance for colon cancer screening colonoscopy.  Patient is inclined to forego further screening.   Problem # 4:  GRIEF REACTION, ACUTE (ICD-309.0)  Patient recently widowed. Attending grief counseling. COnsider PHQ-9 if not improved.   Orders: [facility]- Est  Level 4 ([address])  Complete Medication List: 1)  Klonopin 0.5 Mg Tabs (Clonazepam) 2)  Hydroxyurea 500 Mg Caps (Hydroxyurea) .[name].. 1 by mouth qd 3)  Caltrate 600+d Plus 600-400 Mg-unit Tabs (Calcium carbonate-vit d-min) .[name].. 1 by mouth bid 4)  Adult Aspirin Low Strength 81 Mg Tbdp (Aspirin) .[name].. 1 by mouth qd 5)  Klonopin 0.5 Mg Tabs (Clonazepam) .... Take one tablet at bedtime 6)  Zostavax 57846 Unt/0.59ml Solr (Zoster vaccine live) .... Once 7)  Hydrochlorothiazide 25 Mg Tabs (Hydrochlorothiazide) .... Sig take 1 tab by mouth once daily  Patient Instructions: 1)  It was a pleasure to see you today.  2)  I believe the HCTZ may be the reason for your recent change in urinary patterns.  I ask that you hold the HCTZ and come back for a nurse blood pressure check in about 2 weeks.  If the blood pressure is between 140 and 160, I would hold the HCTZ indefinitely. 3)  I will contact you with the results of the blood work we are getting today. 4)  I would like to see you back for a visit in about 2 months, or sooner if you need it.    Prevention & Chronic Care Immunizations   Influenza vaccine: Not documented    Tetanus booster: Not documented    Pneumococcal vaccine: Pneumovax (Medicare)  (02/22/2008)   Pneumococcal vaccine due: None    H. zoster vaccine: 10/12/2007: given  Colorectal Screening    Hemoccult: Not documented    Colonoscopy: Not documented  Other Screening   Pap smear: Done.  (01/07/1988)   Pap smear due: 01/06/1989    Mammogram: Done.  (09/06/1996)   Mammogram due: 09/06/1997    DXA bone density scan: Not documented   Smoking status: quit  (01/24/2009)  Lipids   Total Cholesterol: 180  (01/05/2007)   LDL: 98  (01/05/2007)   LDL Direct: Not documented   HDL: 49  (01/05/2007)   Triglycerides: 163  (01/05/2007)    SGOT (AST): Not documented   SGPT (ALT): Not documented CMP ordered    Alkaline phosphatase: Not documented   Total bilirubin: Not documented    Lipid flowsheet reviewed?: Yes   Progress toward LDL goal: Unchanged  Hypertension   Last Blood Pressure: 144 / 82  (01/24/2009)   Serum creatinine: 0.83  (02/22/2008)   Serum potassium  4.7  (02/22/2008) CMP ordered     Hypertension flowsheet reviewed?: Yes   Progress toward BP goal: At goal  Self-Management Support :   Personal Goals (by the next clinic visit) :      Personal blood pressure goal: 140/90  (12/20/2008)     Personal LDL goal: 130  (12/20/2008)    Hypertension self-management support: Not documented    Lipid self-management support: Not documented   Laboratory Results   Urine Tests  Date/Time Received: January 24, 2009 12:00 PM  Date/Time Reported: January 24, 2009 12:34 PM   Routine Urinalysis   Color: yellow Appearance: Clear Glucose: negative   (Normal Range: Negative) Bilirubin: negative   (Normal Range: Negative) Ketone: negative   (Normal Range: Negative) Spec. Gravity: 1.015   (Normal Range: 1.003-1.035) Blood: small   (Normal Range: Negative) pH: 6.5   (Normal Range: 5.0-8.0) Protein: trace   (Normal Range: Negative) Urobilinogen: 0.2   (Normal Range: 0-1) Nitrite: negative   (Normal Range: Negative) Leukocyte Esterace: negative   (Normal Range: Negative)  Urine Microscopic RBC/HPF: 1-5 Bacteria/HPF: trace Epithelial/HPF: rare     Comments: ...............test performed by......Marland KitchenBonnie A. [place], MLS (ASCP)cm

## 2010-02-05 NOTE — Letter (Signed)
Summary: Generic Letter  [facility]  [address]   [place], [place] [address]   Phone: [phone]  Fax: [phone]    11/27/2009  Jill Myers [address] [place], [place]  [address]  Dear [name],    It was very nice to spend time with you and review you health status.  I have included the complete evaluation for your records.  If I can be of further assistance please let me know.       Sincerely,   [name] NP  Appended Document: Generic Letter mailed

## 2010-02-05 NOTE — Assessment & Plan Note (Signed)
Summary: see Jill Myers -consult orthotics,[facility]   Primary Provider:  Paula Compton MD   History of Present Illness: Jill Myers comes in for bilat foot pain.  She has 2 pairs of orthotics that we have made - onemore than 75 years old and the other  ~ 77 yrs old. These have helped her continue walking since she first had foot pain. Now pain is centered along long arch but also into MT area of both fore feet. No real change in foot wear or activity. Pain has gradually worsened to limit her walking so she sought care.  [employer] store sold her on a pair of hard rubber orthotics with an insole ($300) that are not comfortable and have not helped.  Allergies: 1)  ! Septra Ds (Sulfamethoxazole-Trimethoprim)  Physical Exam  General:  Well-developed,well-nourished,in no acute distress; alert,appropriate and cooperative throughout examination Msk:  prexerved long arch bilat with cavus foot type the arch measures 2.4 cms at nav prominence transverse arch bilat is showing some collapse there is also lack of padding under MT heads there is mild callusing over distal 2nd and 3rd MT areas   Impression & Recommendations:  Problem # 1:  FOOT PAIN, BILATERAL (ICD-729.5) This is a combination of long arch strain and forefoot arch collapse  her old orthotics are worn but still have good overall shape  we will recondition these today with more padding to see if this helps  I thihk the hard rubber ones from [employer] are too high for her comfort and she can return these  If not improving we should make a new pair  Problem # 2:  METATARSALGIA (ICD-726.70) MT pads applied to her orthotics these seem comfortable with walking  gait is normal after placement  will keep these on foot supports  reck after 1 mos trial or as needed  Complete Medication List: 1)  Klonopin 0.5 Mg Tabs (Clonazepam) 2)  Hydroxyurea 500 Mg Caps (Hydroxyurea) .[name].. 1 by mouth qd 3)  Caltrate 600+d Plus 600-400 Mg-unit Tabs (Calcium  carbonate-vit d-min) .[name].. 1 by mouth bid 4)  Adult Aspirin Low Strength 81 Mg Tbdp (Aspirin) .[name].. 1 by mouth qd 5)  Klonopin 0.5 Mg Tabs (Clonazepam) .... Take one tablet at bedtime 6)  Zostavax 04540 Unt/0.17ml Solr (Zoster vaccine live) .... Once 7)  Hydrochlorothiazide 25 Mg Tabs (Hydrochlorothiazide) .... Sig take 1 tab by mouth once daily  Appended Document: see Jill Myers -consult orthotics,mc Evaluated by Dr. Darrick Penna

## 2010-02-05 NOTE — Consult Note (Signed)
Summary: [facility] Hem/Onc  [facility] Hem/Onc   Imported By: Clydell Hakim 09/27/2009 14:53:05  _____________________________________________________________________  External Attachment:    Type:   Image     Comment:   External Document

## 2010-02-05 NOTE — Letter (Signed)
Summary: Generic Letter  [facility]  [address]   [place], [place] [address]   Phone: [phone]  Fax: [phone]    01/25/2009  Jill Myers [address] [place], [place]  [address]  Dear Ms. Angelo,    It was a pleasure to see you in the office yesterday.  I write with great news about your lab values.  Your cholesterol profile and metabolic panel are all within normal limits.  A copy is attached.  Please be in touch if you need anything.     Sincerely,   Paula Compton MD  Appended Document: Generic Letter mailed.

## 2010-02-05 NOTE — Assessment & Plan Note (Signed)
Summary: bp ck,df  Patient in office today for BP check. she bumped her noses and went to Urgent Care on 09/10/2009 and BP systolic was 190, then180. she has been checking at home and reading have continued to be high. BP check manually today using regular cuff  RA 170/76 , LA 174/86 pulse 72.  BP also checked with patient's home digital monitor with reading of 195/91 LA. she has been off HCTZ as directed by MD , she was taken off the med due to some urinary frequency. she states this is better but she also has been off coffee and  she thinks this helps also. Dr. Mauricio Po notified and he spoke with patient and advised her to restart HCTZ and follow up BP check in 2 weeks. appointment scheduled in nurse clinic for 03/26/2009. Theresia Lo RN  March 12, 2009 11:37 AM  Nurse Visit   Allergies: 1)  ! Septra Ds (Sulfamethoxazole-Trimethoprim)  Orders Added: 1)  No Charge Patient Arrived ([id number]) [[id number]]

## 2010-02-05 NOTE — Miscellaneous (Signed)
Summary: [facility]   Imported By: Clydell Hakim 02/19/2009 11:39:45  _____________________________________________________________________  External Attachment:    Type:   Image     Comment:   External Document

## 2010-02-07 NOTE — Progress Notes (Signed)
   Called pt at home.  Very slight amount of dizziness.  Did not get the handout on the Epley's maneuvers.  Will leave at front desk.  Reported that metabolic panel results are normal. Paula Compton MD  January 21, 2010 8:31 AM

## 2010-02-07 NOTE — Letter (Signed)
Summary: Generic Letter  [facility]  [address]   [place], [place] [address]   Phone: [phone]  Fax: [phone]    01/18/2010  Jill Myers [address] [place], [place]  [address]  Dear [name],  It was a pleasure to see you today.  I am pleased to report that your blood work (kidney function, glucose) and your urinalysis are both within normal limits.    I will see you in the coming 2 weeks, before your trip to [place].        Sincerely,   Jill Compton MD  Appended Document: Generic Letter mailed

## 2010-02-07 NOTE — Assessment & Plan Note (Signed)
Summary: routine visit/eo   Vital Signs:  Patient profile:   77 year old female Height:      63.5 inches Weight:      131.38 pounds BMI:     22.99 BSA:     1.63 O2 Sat:      95 % on Room air Temp:     98.6 degrees F Pulse rate:   80 / minute BP sitting:   180 / 100  Vitals Entered By: Jone Baseman CMA (January 18, 2010 3:08 PM)  O2 Flow:  Room air  Serial Vital Signs/Assessments:  Time      Position  BP       Pulse  Resp  Temp     By                     174/90                         Paula Compton MD  Comments: R arm sitting manual By: Paula Compton MD   CC: f/u Is Patient Diabetic? No Pain Assessment Patient in pain? no        Primary Care Provider:  Paula Compton MD  CC:  f/u.  History of Present Illness: Visit for followup of chronic issues, as well as new complaint of vertigo.  Dizziness which started 7 days ago, came about when she got up quickly.  Persisted throughout the day, wiht worsening associated iwth changes in position.  No fevers or chills, no cough, no vomiting but did have nausea.  Worst day on Monday Jan 9, better on Jan 10 (Tues) and went to workout but felt worse again.  Held off on exercise until today, went back gingerly and felt ok.  Still "not quite right".  No palpitations, no chest pain,no shortness of breath.  Did not feel presyncopal.  Reminiscent of prior episode many yrs ago when she had positional vertigo.  Not taking any meds for this, no new meds that might have incited this.   Also has noted increased in BP; at home had readings in the 150s, is still taking the HCTZ daily as directed.   Planning a trip to [place] in early Feb,plans to go to [place] and up the [place].  Would like Diamox for altitude sickness; plans trip to be about 2 weeks, starting at sea level and then ascending.   Has urinary urgency, no dysuria or change in urine appearance.  Would like to take something for this while traveling.   Habits &  Providers  Alcohol-Tobacco-Diet     Alcohol drinks/day: <1     Tobacco Status: quit > 6 months     Year Quit: 1958     Diet Comments: High sodium, using frozen dinners     Diet Counseling: to improve diet; diet is suboptimal  Allergies: 1)  ! Septra Ds (Sulfamethoxazole-Trimethoprim)  Past History:  Past Medical History: left ITB 7/00, metatarsalgia, myelodysplastic syndrome  Jan 18, 2010: No prior cardiac workup or diagnoses.  Last ECG over 10 yrs ago for preop evaluation.   Family History: 10 siblings 1 with glaucoma, fatherr died at 31 but used etoh, mother died at 70  Social History: works as Psychologist, educational; retired Engineer, site; lives with husband/ 2 children / 1 in [place]; non smoker  January 24, 2009: Widowed, considering move to [place]  Jan 18, 2010: Retired Runner, broadcasting/film/video; works mornings at [employer]. Planning travel to [place]  for 2weeks. Maintains activity  Physical Exam  General:  well appearing, no apparent distress.  Eyes:  clear conjunctivae.  Ears:  clear TMs bilaterally Mouth:  clear oropharynx, moist mucus membranes Neck:  neck supple. no thyroid masses or adenopathy.  No carotid bruits audible.  Lungs:  Normal respiratory effort, chest expands symmetrically. Lungs are clear to auscultation, no crackles or wheezes. Heart:  Normal rate and regular rhythm. S1 and S2 normal without gallop, murmur, click, rub or other extra sounds. Extremities:  no ankle edema bilaterally Neurologic:  Dix-Hallpike with horizontal nystagmus when turned to the Right.  Symptoms incited when turned R.   Additional Exam:  ECG: NSR, no prolonged QTc; equivocal elevation of ST segment in V1 only. LVH noted   Impression & Recommendations:  Problem # 1:  BENIGN PAROXYSMAL POSITIONAL VERTIGO (ICD-386.11)  Dizziness with worsening when looking R on [employer] in office. No findings to suggest Aortic valvular disease, hx not suggestive of arrhythmia. ECG done today.    Orders: [facility]- Est  Level 4 (04540)  Problem # 2:  FREQUENCY, URINARY (ICD-788.41)  Urgency.  To check UA today. Vesicare for urinary urge incontinence.    Her updated medication list for this problem includes:    Vesicare 5 Mg Tabs (Solifenacin succinate) .[name]... Take 1 tab by mouth one time daily  Problem # 3:  ESSENTIAL HYPERTENSION, BENIGN (ICD-401.1)  Elevated BP today.  Unsure if related to BPPV or other cause.  No chest pain, no dyspnea.  Will recheck at next visit in 2 weeks, or sooner if needed. Her updated medication list for this problem includes:    Hydrochlorothiazide 25 Mg Tabs (Hydrochlorothiazide) ..... Sig take 1 tab by mouth once daily    Acetazolamide 125 Mg Tabs (Acetazolamide) ..... Sig: take 1 tab by mouth every 12 hours, beginning 24 hours before ascent, and continue until 48hr after maximum ascent  Orders: [facility]- Est  Level 4 (99214)  Complete Medication List: 1)  Klonopin 0.5 Mg Tabs (Clonazepam) .[name].. 1 by mouth at bedtime quant suff 3 months 2)  Hydroxyurea 500 Mg Caps (Hydroxyurea) .[name].. 1 by mouth qd 3)  Caltrate 600+d Plus 600-400 Mg-unit Tabs (Calcium carbonate-vit d-min) .[name].. 1 by mouth bid 4)  Adult Aspirin Low Strength 81 Mg Tbdp (Aspirin) .[name].. 1 by mouth qd 5)  Hydrochlorothiazide 25 Mg Tabs (Hydrochlorothiazide) .... Sig take 1 tab by mouth once daily 6)  Daily-vitamin Tabs (Multiple vitamin) 7)  Biotin 300 Mcg Tabs (Biotin) 8)  Cvs Glucosamine-chondroitin 500-400 Mg Tabs (Glucosamine-chondroitin) 9)  Vesicare 5 Mg Tabs (Solifenacin succinate) .... Take 1 tab by mouth one time daily 10)  Acetazolamide 125 Mg Tabs (Acetazolamide) .... Sig: take 1 tab by mouth every 12 hours, beginning 24 hours before ascent, and continue until 48hr after maximum ascent  Other Orders: EKG- [facility] (EKG) Urinalysis-[facility] (00000) Basic Met-[facility] (98119-14782)  Patient Instructions: 1)  It was a pleasure to see you today.  2)  1) For the dizziness, I believe you have benign  positional vertigo.  I am giving you a handout on Epley's Maneuver, which I ask you to perform a few times a day at home, for resolution of the symptoms.  3)  2) I am prescribing you Diamox (acetazolamide) 125mg , to take for prevention of altitude sickness.  Begin taking 1 tab twice daily 24-48 hrs before beginning ascent; continue until 48 hrs after maximum ascent.  4)  3) I am prescribing Vesicare 5mg  tabs, one daily for urgency.  I  am sending a urinalysis today as well.  5)  4) I would like to see you back in the coming 2 weeks to check your blood pressure and see how you are doing.  Prescriptions: ACETAZOLAMIDE 125 MG TABS (ACETAZOLAMIDE) SIG: Take 1 tab by mouth every 12 hours, beginning 24 hours before ascent, and continue until 48hr after maximum ascent  #30 x 0   Entered and Authorized by:   Paula Compton MD   Signed by:   Paula Compton MD on 01/18/2010   Method used:   Print then Give to Patient   RxID:   [id number] VESICARE 5 MG TABS (SOLIFENACIN SUCCINATE) Take 1 tab by mouth one time daily  #30 x 3   Entered and Authorized by:   Paula Compton MD   Signed by:   Paula Compton MD on 01/18/2010   Method used:   Print then Give to Patient   RxID:   [id number]    Orders Added: 1)  EKG- [facility] [EKG] 2)  Urinalysis-[facility] [00000] 3)  Basic Met-[facility] [84696-29528] 4)  [facility]- Est  Level 4 [41324]    Laboratory Results   Urine Tests  Date/Time Received: January 18, 2010 4:20 PM  Date/Time Reported: January 18, 2010 4:40 PM   Routine Urinalysis   Color: yellow Appearance: Clear Glucose: negative   (Normal Range: Negative) Bilirubin: negative   (Normal Range: Negative) Ketone: negative   (Normal Range: Negative) Spec. Gravity: 1.015   (Normal Range: 1.003-1.035) Blood: small   (Normal Range: Negative) pH: 7.0   (Normal Range: 5.0-8.0) Protein: negative   (Normal Range: Negative) Urobilinogen: 0.2   (Normal Range: 0-1) Nitrite: negative   (Normal Range: Negative) Leukocyte  Esterace: negative   (Normal Range: Negative)  Urine Microscopic RBC/HPF: 1-5 Bacteria/HPF: trace Epithelial/HPF: rare    Comments: ...............test performed by......Marland KitchenBonnie A. [place], MLS (ASCP)cm

## 2010-02-07 NOTE — Assessment & Plan Note (Signed)
Summary: bp check/eo   Nurse Visit patient in for BP check today . BP checked manually after patient rested a few minutes. BP LA 142/78 and RA 142/78 pulse 82. will forward to MD.     patient also requests RX be sent in for Vesicare and HCTZ for 90 day supply instead of 30 day supply. will forward to MD. Theresia Lo RN  February 01, 2010 2:43 PM   Allergies: 1)  ! Septra Ds (Sulfamethoxazole-Trimethoprim)  Orders Added: 1)  No Charge Patient Arrived (NCPA0) [NCPA0] Prescriptions: HYDROCHLOROTHIAZIDE 25 MG TABS (HYDROCHLOROTHIAZIDE) SIG Take 1 tab by mouth once daily  #90 x 4   Entered and Authorized by:   Paula Compton MD   Signed by:   Paula Compton MD on 02/01/2010   Method used:   Electronically to        [facility]. [phone]* (retail)       [address]       [place], [place]  [address]       Ph: [phone]       Fax: [phone]   RxID:   [id number] VESICARE 5 MG TABS (SOLIFENACIN SUCCINATE) Take 1 tab by mouth one time daily  #90 x 4   Entered and Authorized by:   Paula Compton MD   Signed by:   Paula Compton MD on 02/01/2010   Method used:   Electronically to        [facility]. [phone]* (retail)       [address]       [place], [place]  [address]       Ph: [phone]       Fax: [phone]   RxID:   [id number]  Blood pressures in keeping with previous readings.  I do not think we need to change therapy.  I sent prescriptions for vesicare and HCTZ to the [facility] on [address] for 90 day supplies. Paula Compton MD  February 01, 2010 4:36 PM  Appended Document: bp check/eo patient notified.

## 2010-03-11 ENCOUNTER — Encounter: Payer: Self-pay | Admitting: Home Health Services

## 2010-03-12 ENCOUNTER — Other Ambulatory Visit: Payer: Self-pay | Admitting: Oncology

## 2010-03-12 ENCOUNTER — Encounter (HOSPITAL_BASED_OUTPATIENT_CLINIC_OR_DEPARTMENT_OTHER): Payer: Medicare Other | Admitting: Oncology

## 2010-03-12 DIAGNOSIS — D47Z9 Other specified neoplasms of uncertain behavior of lymphoid, hematopoietic and related tissue: Secondary | ICD-10-CM

## 2010-03-12 DIAGNOSIS — D473 Essential (hemorrhagic) thrombocythemia: Secondary | ICD-10-CM

## 2010-03-12 LAB — COMPREHENSIVE METABOLIC PANEL
BUN: 20 mg/dL (ref 6–23)
CO2: 29 mEq/L (ref 19–32)
Calcium: 9.4 mg/dL (ref 8.4–10.5)
Chloride: 98 mEq/L (ref 96–112)
Creatinine, Ser: 0.8 mg/dL (ref 0.40–1.20)
Glucose, Bld: 101 mg/dL — ABNORMAL HIGH (ref 70–99)

## 2010-03-12 LAB — CBC WITH DIFFERENTIAL/PLATELET
BASO%: 0 % (ref 0.0–2.0)
EOS%: 0.8 % (ref 0.0–7.0)
HCT: 40.6 % (ref 34.8–46.6)
LYMPH%: 20.7 % (ref 14.0–49.7)
MCH: 35.8 pg — ABNORMAL HIGH (ref 25.1–34.0)
MCHC: 33.7 g/dL (ref 31.5–36.0)
MONO%: 10.5 % (ref 0.0–14.0)
NEUT%: 68 % (ref 38.4–76.8)
Platelets: 639 10*3/uL — ABNORMAL HIGH (ref 145–400)
lymph#: 1.3 10*3/uL (ref 0.9–3.3)

## 2010-03-12 LAB — LACTATE DEHYDROGENASE: LDH: 142 U/L (ref 94–250)

## 2010-03-26 ENCOUNTER — Encounter (HOSPITAL_BASED_OUTPATIENT_CLINIC_OR_DEPARTMENT_OTHER): Payer: Medicare Other | Admitting: Oncology

## 2010-03-26 ENCOUNTER — Other Ambulatory Visit: Payer: Self-pay | Admitting: Oncology

## 2010-03-26 DIAGNOSIS — D473 Essential (hemorrhagic) thrombocythemia: Secondary | ICD-10-CM

## 2010-03-26 DIAGNOSIS — D47Z9 Other specified neoplasms of uncertain behavior of lymphoid, hematopoietic and related tissue: Secondary | ICD-10-CM

## 2010-03-26 LAB — CBC WITH DIFFERENTIAL/PLATELET
BASO%: 0.3 % (ref 0.0–2.0)
Eosinophils Absolute: 0.1 10*3/uL (ref 0.0–0.5)
MCHC: 34.6 g/dL (ref 31.5–36.0)
MONO#: 0.6 10*3/uL (ref 0.1–0.9)
MONO%: 9.6 % (ref 0.0–14.0)
NEUT#: 4.1 10*3/uL (ref 1.5–6.5)
RBC: 3.55 10*6/uL — ABNORMAL LOW (ref 3.70–5.45)
RDW: 14.3 % (ref 11.2–14.5)
WBC: 6.4 10*3/uL (ref 3.9–10.3)

## 2010-04-09 ENCOUNTER — Encounter (HOSPITAL_BASED_OUTPATIENT_CLINIC_OR_DEPARTMENT_OTHER): Payer: Medicare Other | Admitting: Oncology

## 2010-04-09 ENCOUNTER — Other Ambulatory Visit: Payer: Self-pay | Admitting: Oncology

## 2010-04-09 DIAGNOSIS — D473 Essential (hemorrhagic) thrombocythemia: Secondary | ICD-10-CM

## 2010-04-09 DIAGNOSIS — D47Z9 Other specified neoplasms of uncertain behavior of lymphoid, hematopoietic and related tissue: Secondary | ICD-10-CM

## 2010-04-09 LAB — MORPHOLOGY
PLT EST: ADEQUATE
RBC Comments: NORMAL

## 2010-04-09 LAB — CBC WITH DIFFERENTIAL/PLATELET
Eosinophils Absolute: 0.1 10*3/uL (ref 0.0–0.5)
HCT: 37.2 % (ref 34.8–46.6)
LYMPH%: 29.5 % (ref 14.0–49.7)
MONO#: 0.6 10*3/uL (ref 0.1–0.9)
NEUT#: 3.4 10*3/uL (ref 1.5–6.5)
Platelets: 444 10*3/uL — ABNORMAL HIGH (ref 145–400)
RBC: 3.47 10*6/uL — ABNORMAL LOW (ref 3.70–5.45)
WBC: 5.8 10*3/uL (ref 3.9–10.3)
lymph#: 1.7 10*3/uL (ref 0.9–3.3)

## 2010-04-21 ENCOUNTER — Other Ambulatory Visit: Payer: Self-pay | Admitting: Family Medicine

## 2010-04-22 NOTE — Telephone Encounter (Signed)
Refill request

## 2010-05-07 ENCOUNTER — Other Ambulatory Visit: Payer: Self-pay | Admitting: Oncology

## 2010-05-07 ENCOUNTER — Encounter (HOSPITAL_BASED_OUTPATIENT_CLINIC_OR_DEPARTMENT_OTHER): Payer: Medicare Other | Admitting: Oncology

## 2010-05-07 DIAGNOSIS — D47Z9 Other specified neoplasms of uncertain behavior of lymphoid, hematopoietic and related tissue: Secondary | ICD-10-CM

## 2010-05-07 DIAGNOSIS — D473 Essential (hemorrhagic) thrombocythemia: Secondary | ICD-10-CM

## 2010-05-07 LAB — CBC WITH DIFFERENTIAL/PLATELET
BASO%: 0.5 % (ref 0.0–2.0)
EOS%: 0.6 % (ref 0.0–7.0)
HCT: 36.4 % (ref 34.8–46.6)
MCH: 37.1 pg — ABNORMAL HIGH (ref 25.1–34.0)
MCHC: 34.3 g/dL (ref 31.5–36.0)
MONO#: 0.6 10*3/uL (ref 0.1–0.9)
RBC: 3.37 10*6/uL — ABNORMAL LOW (ref 3.70–5.45)
RDW: 15.4 % — ABNORMAL HIGH (ref 11.2–14.5)
WBC: 5 10*3/uL (ref 3.9–10.3)
lymph#: 1.7 10*3/uL (ref 0.9–3.3)

## 2010-05-07 LAB — MORPHOLOGY: PLT EST: ADEQUATE

## 2010-05-21 ENCOUNTER — Encounter: Payer: Self-pay | Admitting: Family Medicine

## 2010-05-21 ENCOUNTER — Ambulatory Visit (INDEPENDENT_AMBULATORY_CARE_PROVIDER_SITE_OTHER): Payer: Medicare Other | Admitting: Family Medicine

## 2010-05-21 VITALS — BP 140/80 | HR 73 | Wt 131.6 lb

## 2010-05-21 DIAGNOSIS — N8110 Cystocele, unspecified: Secondary | ICD-10-CM | POA: Insufficient documentation

## 2010-05-21 DIAGNOSIS — IMO0002 Reserved for concepts with insufficient information to code with codable children: Secondary | ICD-10-CM

## 2010-05-21 DIAGNOSIS — N899 Noninflammatory disorder of vagina, unspecified: Secondary | ICD-10-CM

## 2010-05-21 DIAGNOSIS — N898 Other specified noninflammatory disorders of vagina: Secondary | ICD-10-CM | POA: Insufficient documentation

## 2010-05-21 DIAGNOSIS — R300 Dysuria: Secondary | ICD-10-CM | POA: Insufficient documentation

## 2010-05-21 DIAGNOSIS — N3941 Urge incontinence: Secondary | ICD-10-CM

## 2010-05-21 DIAGNOSIS — N8111 Cystocele, midline: Secondary | ICD-10-CM

## 2010-05-21 HISTORY — DX: Reserved for concepts with insufficient information to code with codable children: IMO0002

## 2010-05-21 LAB — POCT URINALYSIS DIPSTICK
Bilirubin, UA: NEGATIVE
Glucose, UA: NEGATIVE
Leukocytes, UA: NEGATIVE
Nitrite, UA: NEGATIVE
Urobilinogen, UA: 0.2

## 2010-05-21 LAB — POCT WET PREP (WET MOUNT)
Trichomonas Wet Prep HPF POC: NEGATIVE
Yeast Wet Prep HPF POC: NEGATIVE

## 2010-05-21 LAB — POCT UA - MICROSCOPIC ONLY

## 2010-05-21 NOTE — Assessment & Plan Note (Signed)
Reports significant improvement on Vesicare

## 2010-05-21 NOTE — Assessment & Plan Note (Signed)
Discussed GYN or URO referral, she will think about it but if she wants it evaluated she would prefer GYN.  Discussed pessary and she was familiar.

## 2010-05-21 NOTE — Assessment & Plan Note (Signed)
Cathed for clear urine, sent for culture.

## 2010-05-21 NOTE — Progress Notes (Signed)
  Subjective:    Patient ID: Jill Myers, female    DOB: Jan 31, 1930, 77 y.o.   MRN: [id number]  HPI Patient describes vaginal irritation for 2 weeks, and some dysuria for a few days at the onset but subsided.  She recently started Visicare for urge pattern and has noted improvement. She self inspected and felt a bulge in her vaginal area and was concerned about prolapse as she knows of its occurrence in others. She has not seen any blood or discharge coming from her vagina.  She has an overall change in her feeling of "well being" for a few days.  She endorses feeling of depression but does not see this as a different pattern, that some days a good thing will occur that will leave her feeling better for a while.  She is still actively grieving her husband.  She has regular labs completed by heme/onc for thrombocytosis.   Review of Systems  Constitutional: Negative for fever, fatigue and unexpected weight change.  Gastrointestinal: Negative for abdominal pain.  Genitourinary: Positive for dysuria, urgency and vaginal pain. Negative for decreased urine volume, vaginal bleeding, vaginal discharge and difficulty urinating.  Psychiatric/Behavioral: Positive for dysphoric mood.       Objective:   Physical Exam  Constitutional:       Alert, bright, dysthymic mood.  Genitourinary:       Grade 2 cystocele,  Stenotic, atrophic vaginal wall.  Cervix appears flat and dull pink, no lesions or discharge noted.          Assessment & Plan:

## 2010-05-21 NOTE — Patient Instructions (Signed)
If this feeling persists please return to see Dr. Mauricio Po Urology or gynecology are options for the bladder prolapse I will inform you of your urine culture report It was nice to see you.

## 2010-05-23 ENCOUNTER — Encounter: Payer: Self-pay | Admitting: Family Medicine

## 2010-05-23 LAB — URINE CULTURE: Organism ID, Bacteria: NO GROWTH

## 2010-05-24 NOTE — Op Note (Signed)
[name], [name]                            ACCOUNT NO.:  [id number]   MEDICAL RECORD NO.:  [id number]                   PATIENT TYPE:  AMB   LOCATION:  [facility]                                  FACILITY:  [facility]   PHYSICIAN:  Artist Pais. Mina Marble, M.D.           DATE OF BIRTH:  Feb 16, 1930   DATE OF PROCEDURE:  12/07/2002  DATE OF DISCHARGE:                                 OPERATIVE REPORT   PREOPERATIVE DIAGNOSIS:  Right thumb laceration.   POSTOPERATIVE DIAGNOSIS:  Right thumb laceration.   PROCEDURE:  Repair of right thumb extensor pollicis longus tendon.   SURGEON:  Artist Pais. Mina Marble, M.D.   ASSISTANT:  Nurse.   ANESTHESIA:  Regional.   TOURNIQUET TIME:  40 minutes.   COMPLICATIONS:  None.   DRAINS:  None.   DESCRIPTION OF PROCEDURE:  The patient was taken to the operating room,  after the induction of regional anesthetic the right upper extremity was  prepped and draped in the usual sterile fashion.  Once this was done, an  oblique laceration across the base of the proximal phalanx and metacarpal  area of the right thumb was extended proximally.  Dissection was carried  down to the third dorsal compartment.  The ETL tendon was completely  lacerated.  The proximal limb was retracted to some degree.  This was  retrieved, pulled underneath large dorsal veins, and repaired to the distal  stump using a 3-0 double armed Ethibond and __________ type suture, followed  by 6-0 Prolene epitendinous stitch.  After this was done, the skin was  closed with a 5-0 nylon and interrupted horizontal mattress sutures, sterile  dressing with Xeroform, 4 x 4, fluffs, and a thumb spica splint with the  CMC, NP, and IP joints extended was applied.  The patient tolerated the  procedure well, went to recovery in a stable fashion.                                               Artist Pais Mina Marble, M.D.    MAW/MEDQ  D:  12/07/2002  T:  12/07/2002  Job:  [id number]

## 2010-06-04 ENCOUNTER — Encounter (HOSPITAL_BASED_OUTPATIENT_CLINIC_OR_DEPARTMENT_OTHER): Payer: Medicare Other | Admitting: Oncology

## 2010-06-04 ENCOUNTER — Other Ambulatory Visit: Payer: Self-pay | Admitting: Oncology

## 2010-06-04 DIAGNOSIS — D47Z9 Other specified neoplasms of uncertain behavior of lymphoid, hematopoietic and related tissue: Secondary | ICD-10-CM

## 2010-06-04 DIAGNOSIS — D473 Essential (hemorrhagic) thrombocythemia: Secondary | ICD-10-CM

## 2010-06-04 LAB — MORPHOLOGY: PLT EST: ADEQUATE

## 2010-06-04 LAB — CBC WITH DIFFERENTIAL/PLATELET
Basophils Absolute: 0 10*3/uL (ref 0.0–0.1)
HCT: 34.9 % (ref 34.8–46.6)
HGB: 12 g/dL (ref 11.6–15.9)
LYMPH%: 34.7 % (ref 14.0–49.7)
MCH: 37.7 pg — ABNORMAL HIGH (ref 25.1–34.0)
MCHC: 34.5 g/dL (ref 31.5–36.0)
MONO#: 0.6 10*3/uL (ref 0.1–0.9)
NEUT%: 52.1 % (ref 38.4–76.8)
Platelets: 392 10*3/uL (ref 145–400)
WBC: 4.9 10*3/uL (ref 3.9–10.3)
lymph#: 1.7 10*3/uL (ref 0.9–3.3)

## 2010-07-19 ENCOUNTER — Other Ambulatory Visit: Payer: Self-pay | Admitting: Family Medicine

## 2010-07-19 NOTE — Telephone Encounter (Signed)
Refill request

## 2010-07-24 ENCOUNTER — Telehealth: Payer: Self-pay | Admitting: Family Medicine

## 2010-07-24 NOTE — Telephone Encounter (Signed)
Pt checking status of rx for clonazepam, medlist shows it was sent on 7/13 but pharmacy says its not there.

## 2010-07-25 ENCOUNTER — Telehealth: Payer: Self-pay | Admitting: *Deleted

## 2010-07-25 NOTE — Telephone Encounter (Signed)
Called pt. Left message 'rx called in to pharmacy' .Arlyss Repress

## 2010-07-30 ENCOUNTER — Encounter (HOSPITAL_BASED_OUTPATIENT_CLINIC_OR_DEPARTMENT_OTHER): Payer: Medicare Other | Admitting: Oncology

## 2010-07-30 ENCOUNTER — Other Ambulatory Visit: Payer: Self-pay | Admitting: Oncology

## 2010-07-30 DIAGNOSIS — D473 Essential (hemorrhagic) thrombocythemia: Secondary | ICD-10-CM

## 2010-07-30 DIAGNOSIS — D47Z9 Other specified neoplasms of uncertain behavior of lymphoid, hematopoietic and related tissue: Secondary | ICD-10-CM

## 2010-07-30 LAB — CBC WITH DIFFERENTIAL/PLATELET
Basophils Absolute: 0 10*3/uL (ref 0.0–0.1)
Eosinophils Absolute: 0 10*3/uL (ref 0.0–0.5)
HGB: 12.3 g/dL (ref 11.6–15.9)
LYMPH%: 36.4 % (ref 14.0–49.7)
MCV: 113.3 fL — ABNORMAL HIGH (ref 79.5–101.0)
MONO#: 0.6 10*3/uL (ref 0.1–0.9)
MONO%: 12.4 % (ref 0.0–14.0)
NEUT#: 2.4 10*3/uL (ref 1.5–6.5)
Platelets: 403 10*3/uL — ABNORMAL HIGH (ref 145–400)
RBC: 3.15 10*6/uL — ABNORMAL LOW (ref 3.70–5.45)
RDW: 14.1 % (ref 11.2–14.5)
WBC: 4.7 10*3/uL (ref 3.9–10.3)

## 2010-07-30 LAB — MORPHOLOGY: PLT EST: ADEQUATE

## 2010-08-16 ENCOUNTER — Encounter: Payer: Self-pay | Admitting: Family Medicine

## 2010-08-16 ENCOUNTER — Ambulatory Visit (INDEPENDENT_AMBULATORY_CARE_PROVIDER_SITE_OTHER): Payer: Medicare Other | Admitting: Family Medicine

## 2010-08-16 DIAGNOSIS — E559 Vitamin D deficiency, unspecified: Secondary | ICD-10-CM

## 2010-08-16 DIAGNOSIS — IMO0002 Reserved for concepts with insufficient information to code with codable children: Secondary | ICD-10-CM

## 2010-08-16 DIAGNOSIS — N8111 Cystocele, midline: Secondary | ICD-10-CM

## 2010-08-16 DIAGNOSIS — K219 Gastro-esophageal reflux disease without esophagitis: Secondary | ICD-10-CM

## 2010-08-16 DIAGNOSIS — E781 Pure hyperglyceridemia: Secondary | ICD-10-CM

## 2010-08-16 DIAGNOSIS — K5730 Diverticulosis of large intestine without perforation or abscess without bleeding: Secondary | ICD-10-CM

## 2010-08-16 DIAGNOSIS — N3941 Urge incontinence: Secondary | ICD-10-CM

## 2010-08-16 DIAGNOSIS — I10 Essential (primary) hypertension: Secondary | ICD-10-CM

## 2010-08-16 DIAGNOSIS — Z7189 Other specified counseling: Secondary | ICD-10-CM

## 2010-08-16 HISTORY — DX: Vitamin D deficiency, unspecified: E55.9

## 2010-08-16 HISTORY — DX: Gastro-esophageal reflux disease without esophagitis: K21.9

## 2010-08-16 LAB — POCT H PYLORI SCREEN: H Pylori Screen, POC: NEGATIVE

## 2010-08-16 LAB — COMPREHENSIVE METABOLIC PANEL
ALT: 20 U/L (ref 0–35)
Albumin: 4.6 g/dL (ref 3.5–5.2)
CO2: 30 mEq/L (ref 19–32)
Calcium: 9.4 mg/dL (ref 8.4–10.5)
Chloride: 101 mEq/L (ref 96–112)
Glucose, Bld: 91 mg/dL (ref 70–99)
Potassium: 4 mEq/L (ref 3.5–5.3)
Sodium: 140 mEq/L (ref 135–145)
Total Protein: 7.4 g/dL (ref 6.0–8.3)

## 2010-08-16 LAB — LIPID PANEL: LDL Cholesterol: 105 mg/dL — ABNORMAL HIGH (ref 0–99)

## 2010-08-16 MED ORDER — OMEPRAZOLE 40 MG PO CPDR: 40.0000 mg | DELAYED_RELEASE_CAPSULE | Freq: Every day | ORAL | Status: DC

## 2010-08-16 MED ORDER — CLONAZEPAM 0.5 MG PO TABS: 0.5000 mg | ORAL_TABLET | Freq: Every evening | ORAL | Status: DC | PRN

## 2010-08-16 MED ORDER — HYDROCHLOROTHIAZIDE 25 MG PO TABS: 25.0000 mg | ORAL_TABLET | Freq: Every day | ORAL | Status: DC

## 2010-08-16 MED ORDER — SOLIFENACIN SUCCINATE 5 MG PO TABS: 10.0000 mg | ORAL_TABLET | Freq: Every day | ORAL | Status: DC

## 2010-08-16 NOTE — Progress Notes (Signed)
  Subjective:    Patient ID: MURIELLE STANG, female    DOB: 13-Jun-1930, 77 y.o.   MRN: [id number]  HPI Patient here for follow up on several items:  1. Would like to review the amount of Vit D and Calcium she takes.  2. Has had more indigestion and GERD than usual; triggers is red meat.  Worse in evening when laying on the cough.  Better when she gets up and walks. No weight loss. No emesis.  NO fevers or chills. Has taken simethicone which doesn't help.   3. Has history diverticulosis, takes methycellulose tablets which make her go more often.  No blood in stool, but has had loose stools lately. Not painful.  No abdominal pain.   4. History hypertension, is taking HCTZ now.  Would like to review parameters for her HTN.  5.  Continues with urinary frequency/incontinence.  Seen in May by [name] FNP, recommended for pessary.  She would like to do this now.    Review of Systems See HPI    Objective:   Physical Exam Alert, well, no apparent distress HEENT Neck supple. No cervical adenopathy.  COR RRR, no extra sounds PULM clear bilaterally, no rales or wheezes.  ABD Soft, nontender, nondistended.  No organomegaly.  No epigastric tenderness.        Assessment & Plan:

## 2010-08-16 NOTE — Assessment & Plan Note (Signed)
Agrees to Gyn evaluation for appropriateness of pessary and fitting.  Referral completed.

## 2010-08-16 NOTE — Assessment & Plan Note (Signed)
I am pleased with her blood pressure control.  Discussed BP goals in people of her age.  To continue with the HCTZ.

## 2010-08-16 NOTE — Assessment & Plan Note (Signed)
Patient with history cystocele.  For consideration of pessary, to see Gyn

## 2010-08-16 NOTE — Patient Instructions (Signed)
It was good to see you.   For the reflux, we are checking for a bacteria called H pylori.  I am sending a prescription for omeprazole (prilosec) 40mg  one time daily for 6 weeks.  Remember to avoid the triggers we spoke about.   I will contact you with results of labs.    I refilled your meds.  I would like to see you back in another 4 months.   I am referring you for Gyn to evaluate the incontinence issue, consider pessary fitting.

## 2010-08-16 NOTE — Assessment & Plan Note (Signed)
History of diverticulitis in the past.  Trouble titrating the methylcellulose tablets.  May try to change to powder such as MiraLAX 1/2 tbsp in glass of water daily.

## 2010-08-26 ENCOUNTER — Encounter: Payer: Self-pay | Admitting: Family Medicine

## 2010-08-27 ENCOUNTER — Telehealth: Payer: Self-pay | Admitting: *Deleted

## 2010-08-27 ENCOUNTER — Encounter: Payer: Medicare Other | Admitting: Oncology

## 2010-08-27 NOTE — Telephone Encounter (Signed)
Pt called back. And I informed pt of the appt. [name], [name]

## 2010-08-27 NOTE — Telephone Encounter (Signed)
Left message for patient to return call. Please tell her she has appointment at [facility] hospital clinic on 10/11/10 at 10:45. She will need to arrive 10 minutes early and bring copy of insurance card, picture ID, list of meds, and co-pay.Busick, [name]

## 2010-09-17 ENCOUNTER — Other Ambulatory Visit: Payer: Self-pay | Admitting: Oncology

## 2010-09-17 ENCOUNTER — Encounter (HOSPITAL_BASED_OUTPATIENT_CLINIC_OR_DEPARTMENT_OTHER): Payer: Medicare Other | Admitting: Oncology

## 2010-09-17 DIAGNOSIS — D473 Essential (hemorrhagic) thrombocythemia: Secondary | ICD-10-CM

## 2010-09-17 DIAGNOSIS — D47Z9 Other specified neoplasms of uncertain behavior of lymphoid, hematopoietic and related tissue: Secondary | ICD-10-CM

## 2010-09-17 LAB — LACTATE DEHYDROGENASE: LDH: 123 U/L (ref 94–250)

## 2010-09-17 LAB — COMPREHENSIVE METABOLIC PANEL
Albumin: 4.1 g/dL (ref 3.5–5.2)
CO2: 25 mEq/L (ref 19–32)
Chloride: 97 mEq/L (ref 96–112)
Glucose, Bld: 144 mg/dL — ABNORMAL HIGH (ref 70–99)
Potassium: 3.7 mEq/L (ref 3.5–5.3)
Sodium: 137 mEq/L (ref 135–145)
Total Protein: 6.7 g/dL (ref 6.0–8.3)

## 2010-09-17 LAB — URIC ACID: Uric Acid, Serum: 4.3 mg/dL (ref 2.4–7.0)

## 2010-09-17 LAB — CBC WITH DIFFERENTIAL/PLATELET
Basophils Absolute: 0 10*3/uL (ref 0.0–0.1)
EOS%: 0.9 % (ref 0.0–7.0)
HCT: 36.5 % (ref 34.8–46.6)
HGB: 12.8 g/dL (ref 11.6–15.9)
MCH: 39.5 pg — ABNORMAL HIGH (ref 25.1–34.0)
MCV: 112.8 fL — ABNORMAL HIGH (ref 79.5–101.0)
MONO%: 7.4 % (ref 0.0–14.0)
NEUT%: 58.5 % (ref 38.4–76.8)

## 2010-09-30 LAB — DIFFERENTIAL
Basophils Absolute: 0
Basophils Relative: 0
Eosinophils Absolute: 0.1
Eosinophils Relative: 1
Monocytes Absolute: 0.9

## 2010-09-30 LAB — CBC
HCT: 44.1
Hemoglobin: 15.1 — ABNORMAL HIGH
MCHC: 34.2
RDW: 15

## 2010-09-30 LAB — POCT URINALYSIS DIP (DEVICE)
Nitrite: NEGATIVE
Protein, ur: 100 — AB
pH: 6.5

## 2010-10-11 ENCOUNTER — Encounter: Payer: Self-pay | Admitting: Obstetrics and Gynecology

## 2010-10-11 ENCOUNTER — Ambulatory Visit (INDEPENDENT_AMBULATORY_CARE_PROVIDER_SITE_OTHER): Payer: Medicare Other | Admitting: Obstetrics & Gynecology

## 2010-10-11 VITALS — BP 149/85 | HR 77 | Temp 98.3°F | Ht 64.0 in | Wt 130.6 lb

## 2010-10-11 DIAGNOSIS — N952 Postmenopausal atrophic vaginitis: Secondary | ICD-10-CM

## 2010-10-11 DIAGNOSIS — R350 Frequency of micturition: Secondary | ICD-10-CM

## 2010-10-11 LAB — POCT URINALYSIS DIP (DEVICE)
Ketones, ur: NEGATIVE
Nitrite: NEGATIVE
Protein, ur: NEGATIVE
pH: 7

## 2010-10-11 MED ORDER — INFLUENZA VIRUS VACC SPLIT PF IM SUSP
0.5000 mL | Freq: Once | INTRAMUSCULAR | Status: AC
  Administered 2010-10-11: 0.5 mL via INTRAMUSCULAR

## 2010-10-11 NOTE — Progress Notes (Signed)
  Subjective:    Patient ID: Jill Myers, female    DOB: 01/23/1930, 77 y.o.   MRN: [id number]  HPI Referred by [facility] to evaluate bladder prolapse.Pt has some urinary frequency and urge controlled by meds, some sensation of vaginal bulge but no discomfort or pain  Past Medical History  Diagnosis Date  . Hypertension   . Diverticulitis   . Myeloma     prolefica  . Restless legs syndrome (RLS)    Past Surgical History  Procedure Date  . Cholecystectomy    Current outpatient prescriptions:aspirin 81 MG tablet, Take 81 mg by mouth daily.  , Disp: , Rfl: ;  Biotin 300 MCG TABS, Take by mouth.  , Disp: , Rfl: ;  Calcium Carbonate-Vitamin D (CALTRATE 600+D) 600-400 MG-UNIT per tablet, Take 1 tablet by mouth 2 (two) times daily.  , Disp: , Rfl: ;  clonazePAM (KLONOPIN) 0.5 MG tablet, Take 1 tablet (0.5 mg total) by mouth at bedtime as needed for anxiety., Disp: 90 tablet, Rfl: 3 fish oil-omega-3 fatty acids 1000 MG capsule, Take 1 g by mouth daily.  , Disp: , Rfl: ;  glucosamine-chondroitin 500-400 MG tablet, Take by mouth.  , Disp: , Rfl: ;  hydrochlorothiazide 25 MG tablet, Take 1 tablet (25 mg total) by mouth daily., Disp: 90 tablet, Rfl: 3;  hydroxyurea (HYDREA) 500 MG capsule, Take by mouth daily.  , Disp: , Rfl: ;  Multiple Vitamin (MULTIVITAMIN) tablet, Take 1 tablet by mouth daily.  , Disp: , Rfl:  omeprazole (PRILOSEC) 40 MG capsule, Take 1 capsule (40 mg total) by mouth daily., Disp: 30 capsule, Rfl: 1;  polyethylene glycol (MIRALAX / GLYCOLAX) packet, Take 17 g by mouth daily.  , Disp: , Rfl: ;  solifenacin (VESICARE) 5 MG tablet, Take 2 tablets (10 mg total) by mouth daily., Disp: 90 tablet, Rfl: 3 History   Social History  . Marital Status: Widowed    Spouse Name: N/A    Number of Children: N/A  . Years of Education: N/A   Occupational History  . Not on file.   Social History Main Topics  . Smoking status: Former Games developer  . Smokeless tobacco: Not on file  . Alcohol Use: 0.0  oz/week  . Drug Use: No  . Sexually Active: Not on file   Other Topics Concern  . Not on file   Social History Narrative  . No narrative on file     Review of Systems As per HPI    Objective:   Physical Exam Filed Vitals:   10/11/10 1124  Height: 5\' 4"  (1.626 m)  Weight: 130 lb 9.6 oz (59.24 kg)   NAD, pleasant Abd flat NT Pelvic vulvovag atrophy, minimal cystocele, examined standing, cervix nl, uterus nl, no descent          Assessment & Plan:  No significant prolapse Cont f/u at [facility]

## 2010-10-21 ENCOUNTER — Other Ambulatory Visit: Payer: Self-pay | Admitting: Family Medicine

## 2010-10-21 NOTE — Telephone Encounter (Signed)
Refill request

## 2010-11-12 ENCOUNTER — Other Ambulatory Visit (HOSPITAL_BASED_OUTPATIENT_CLINIC_OR_DEPARTMENT_OTHER): Payer: Medicare Other

## 2010-11-12 ENCOUNTER — Telehealth: Payer: Self-pay | Admitting: *Deleted

## 2010-11-12 ENCOUNTER — Other Ambulatory Visit: Payer: Self-pay | Admitting: Oncology

## 2010-11-12 DIAGNOSIS — D473 Essential (hemorrhagic) thrombocythemia: Secondary | ICD-10-CM

## 2010-11-12 LAB — CBC WITH DIFFERENTIAL/PLATELET
BASO%: 0.3 % (ref 0.0–2.0)
EOS%: 0.8 % (ref 0.0–7.0)
HCT: 36.9 % (ref 34.8–46.6)
MCH: 38.9 pg — ABNORMAL HIGH (ref 25.1–34.0)
MCHC: 34.3 g/dL (ref 31.5–36.0)
MCV: 113.6 fL — ABNORMAL HIGH (ref 79.5–101.0)
MONO%: 9.8 % (ref 0.0–14.0)
NEUT%: 54.1 % (ref 38.4–76.8)
lymph#: 1.6 10*3/uL (ref 0.9–3.3)

## 2010-11-12 LAB — MORPHOLOGY: PLT EST: ADEQUATE

## 2010-11-12 NOTE — Telephone Encounter (Signed)
Notified pt. Per Dr. Cyndie Chime to stay on same dose of hydrea = 1000mg  daily.

## 2010-11-19 ENCOUNTER — Telehealth: Payer: Self-pay | Admitting: *Deleted

## 2010-11-19 NOTE — Telephone Encounter (Signed)
PA required for omeprazole. Form placed in MD box. 

## 2010-11-20 ENCOUNTER — Encounter: Payer: Self-pay | Admitting: Home Health Services

## 2010-11-21 ENCOUNTER — Telehealth: Payer: Self-pay | Admitting: Family Medicine

## 2010-11-21 MED ORDER — OMEPRAZOLE 20 MG PO CPDR: 20.0000 mg | DELAYED_RELEASE_CAPSULE | Freq: Every day | ORAL | Status: DC

## 2010-11-21 NOTE — Telephone Encounter (Signed)
Called patient in order to complete Prior Auth for Omeprazole 40mg  daily.  She reports to me in our conversation that she takes it for heartburn; never had history of PUD, esophageal stricture, cough or asthma.  Had recently backed down to 1 capsule QOD and did not have GERD sxs.  Will try omeprazole 20mg  daily and see if controls.  Never tried H1Blocker that she is aware of.  Jill Myers O

## 2010-11-22 NOTE — Telephone Encounter (Signed)
Called patient, who takes omeprazole 40mg  daily for GERD.  No erosive esophagitis or PUD.  Plan to scale back to 20mg  daily, will not seek PA at this time unless sxs recur on lower dose.  Has not tried H1 blocker yet, may try before escalating to omeprazole 40mg  daily.  Jill Myers

## 2010-12-02 ENCOUNTER — Encounter (HOSPITAL_BASED_OUTPATIENT_CLINIC_OR_DEPARTMENT_OTHER)
Admission: RE | Admit: 2010-12-02 | Discharge: 2010-12-02 | Disposition: A | Discharge status: Home / Self Care | Payer: Medicare Other | Source: Ambulatory Visit | Attending: Orthopedic Surgery | Admitting: Orthopedic Surgery

## 2010-12-02 ENCOUNTER — Encounter (HOSPITAL_BASED_OUTPATIENT_CLINIC_OR_DEPARTMENT_OTHER): Payer: Self-pay | Admitting: *Deleted

## 2010-12-02 LAB — BASIC METABOLIC PANEL
BUN: 16 mg/dL (ref 6–23)
CO2: 30 mEq/L (ref 19–32)
Chloride: 99 mEq/L (ref 96–112)
Creatinine, Ser: 0.82 mg/dL (ref 0.50–1.10)
GFR calc Af Amer: 77 mL/min — ABNORMAL LOW (ref >90)
Glucose, Bld: 90 mg/dL (ref 70–99)

## 2010-12-02 NOTE — Progress Notes (Signed)
Pt still works at [employer]

## 2010-12-04 NOTE — H&P (Signed)
  November 11, 2010   [name], MD Fax# [phone]  RE: [place] Paone DOB: 06/04/1930 MEDICARE   Dear Dr. Leonie Myers:  Thank you for referring Jill Myers for a consult regarding her right hallux pigmented nail melanonychia streak. 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 Beginning approximately 2000 she noted a dark streak in her right great toe nail bed. She brought this to your attention and was advised on 10-24-10 to proceed with an excisional biopsy to rule out a dysplastic nevus subungual melanoma. We are more than happy to help out with these biopsy requests. She now presents for an upper extremity orthopaedic consult anticipating toe surgery.  Her past history is reviewed in detail. She does not have any discomfort associated with this predicament. She is allergic to Sulfa. Current medications include Hydroxyurea 500 mg daily for a myeloproliferative disorder with elevated platelets. She takes Clonazepam 0.5 mg daily, HCTZ 25 mg daily and Vesicare 5 mg daily. Prior surgery includes cholecystectomy in 1993. Her social history reveals she is widowed. She is a nonsmoker and does not drink alcoholic beverages. Her family history is detailed and positive for arthritis affecting her mother. A 14 system review of systems is detailed and reveals corrective lenses and a history of hypertension.  Physical exam reveals a delightful 77 year old woman. She is quite thin and fit. Inspection of her great toe reveals a 2 mm wide full length melanonychia. She has no mass at the nail fold. She has no sign of other proximal lesions.  Assessment: New onset melanonychia right hallux.   Plan: We had a detailed discussion regarding the technique of full thickness nail matrix biopsy. We will schedule this on an outpatient basis at the [facility]. She understands that we will reconstruct her hallux toenail and should have a near anatomic result.    [name], MD Page 2 November 11, 2010  RE: Jill Myers DOB: 11/14/1930  Should she turn out to have a mitotic lesion we will need to perform a wide marginal resection and skin grafting. We discussed all the potential predicaments that we could face moving through the algorithm of treatment of melanonychia.   Questions regarding the surgery were invited and answered in detail.    Thank you for allowing me to participate in the care of your patients.   Best regards,    Jill Myers. Jill Myers., MD RVS/phe T: 11-13-10

## 2010-12-04 NOTE — H&P (Addendum)
Jill Myers is an 77 y.o. female.   Chief Complaint: c/o melanotic streak right great toe. HPI: Pt is a 77 y/o female who presented c/o right hallux pigmented nail melanonychia streak.. Beginning approximately 2000 she noted a dark streak in her right great toe nail bed. She was seen by her primary care MD and was advised to undergo biopsy.  Past Medical History  Diagnosis Date  . Hypertension   . Diverticulitis   . Myeloma     prolefica  . Restless legs syndrome (RLS)     Past Surgical History  Procedure Date  . Cholecystectomy     History reviewed. No pertinent family history. Social History:  reports that she quit smoking about 52 years ago. She does not have any smokeless tobacco history on file. She reports that she drinks alcohol. She reports that she does not use illicit drugs.  Allergies:  Allergies  Allergen Reactions  . Sulfamethoxazole W/Trimethoprim     REACTION: Rash after completing course of Septra    No current facility-administered medications on file as of .   Medications Prior to Admission  Medication Sig Dispense Refill  . aspirin 81 MG tablet Take 81 mg by mouth daily.        . Biotin 300 MCG TABS Take by mouth.        . Calcium Carbonate-Vitamin D (CALTRATE 600+D) 600-400 MG-UNIT per tablet Take 1 tablet by mouth 2 (two) times daily.        . clonazePAM (KLONOPIN) 0.5 MG tablet Take 1 tablet (0.5 mg total) by mouth at bedtime as needed for anxiety.  90 tablet  3  . fish oil-omega-3 fatty acids 1000 MG capsule Take 1 g by mouth daily.        [name] glucosamine-chondroitin 500-400 MG tablet Take by mouth.        . hydrochlorothiazide 25 MG tablet Take 1 tablet (25 mg total) by mouth daily.  90 tablet  3  . hydroxyurea (HYDREA) 500 MG capsule Take by mouth daily.        . Multiple Vitamin (MULTIVITAMIN) tablet Take 1 tablet by mouth daily.        . polyethylene glycol (MIRALAX / GLYCOLAX) packet Take 17 g by mouth daily.        . solifenacin (VESICARE) 5 MG  tablet Take 2 tablets (10 mg total) by mouth daily.  90 tablet  3    No results found for this or any previous visit (from the past 48 hour(s)).  No results found.   Pertinent items are noted in HPI.  There were no vitals taken for this visit.  General appearance: alert Head: Normocephalic, without obvious abnormality Neck: supple, symmetrical, trachea midline Resp: clear to auscultation bilaterally Cardio: regular rate and rhythm, S1, S2 normal, no murmur, click, rub or gallop GI: normal findings: bowel sounds normal Extremities:Inspection of her great toe reveals a 2 mm wide full length melanonychia. She has no mass at the nail fold. She has no sign of other proximal lesions Pulses: 2+ and symmetric Skin: normal Neurologic: Grossly normal    Assessment/Plan Assessment: New onset melanonychia right hallux.   Plan: We had a detailed discussion regarding the technique of full thickness nail matrix biopsy. We will schedule this on an outpatient basis at the [facility]. She understands that we will reconstruct her hallux toenail and should have a near anatomic result.   [name],[name] 12/04/2010, 9:49 PM    H&P documentation: 12/05/2010   -  History and Physical Reviewed  -Patient has been re-examined  -No change in the plan of care  [name],[name]

## 2010-12-05 ENCOUNTER — Encounter (HOSPITAL_BASED_OUTPATIENT_CLINIC_OR_DEPARTMENT_OTHER)
Admission: RE | Disposition: A | Discharge status: Home / Self Care | Payer: Self-pay | Source: Ambulatory Visit | Attending: Orthopedic Surgery

## 2010-12-05 ENCOUNTER — Encounter (HOSPITAL_BASED_OUTPATIENT_CLINIC_OR_DEPARTMENT_OTHER): Payer: Self-pay | Admitting: Orthopedic Surgery

## 2010-12-05 ENCOUNTER — Ambulatory Visit (HOSPITAL_BASED_OUTPATIENT_CLINIC_OR_DEPARTMENT_OTHER): Payer: Medicare Other | Admitting: Anesthesiology

## 2010-12-05 ENCOUNTER — Other Ambulatory Visit: Payer: Self-pay | Admitting: Orthopedic Surgery

## 2010-12-05 ENCOUNTER — Encounter (HOSPITAL_BASED_OUTPATIENT_CLINIC_OR_DEPARTMENT_OTHER): Payer: Self-pay | Admitting: Anesthesiology

## 2010-12-05 ENCOUNTER — Encounter (HOSPITAL_BASED_OUTPATIENT_CLINIC_OR_DEPARTMENT_OTHER): Payer: Self-pay | Admitting: *Deleted

## 2010-12-05 ENCOUNTER — Ambulatory Visit (HOSPITAL_BASED_OUTPATIENT_CLINIC_OR_DEPARTMENT_OTHER)
Admission: RE | Admit: 2010-12-05 | Discharge: 2010-12-05 | Disposition: A | Discharge status: Home / Self Care | Payer: Medicare Other | Source: Ambulatory Visit | Attending: Orthopedic Surgery | Admitting: Orthopedic Surgery

## 2010-12-05 DIAGNOSIS — I10 Essential (primary) hypertension: Secondary | ICD-10-CM | POA: Insufficient documentation

## 2010-12-05 DIAGNOSIS — G2581 Restless legs syndrome: Secondary | ICD-10-CM | POA: Insufficient documentation

## 2010-12-05 DIAGNOSIS — L819 Disorder of pigmentation, unspecified: Secondary | ICD-10-CM | POA: Insufficient documentation

## 2010-12-05 DIAGNOSIS — Z01812 Encounter for preprocedural laboratory examination: Secondary | ICD-10-CM | POA: Insufficient documentation

## 2010-12-05 HISTORY — PX: NAILBED REPAIR: SHX5028

## 2010-12-05 SURGERY — REPAIR, NAIL BED
Anesthesia: Monitor Anesthesia Care | Site: Toe | Laterality: Right | Wound class: Clean

## 2010-12-05 MED ORDER — LIDOCAINE HCL 2 % IJ SOLN
INTRAMUSCULAR | Status: DC | PRN
  Administered 2010-12-05: 5 mL

## 2010-12-05 MED ORDER — LACTATED RINGERS IV SOLN
INTRAVENOUS | Status: DC
  Administered 2010-12-05: 08:00:00 via INTRAVENOUS

## 2010-12-05 MED ORDER — PROPOFOL 10 MG/ML IV EMUL
INTRAVENOUS | Status: DC | PRN
  Administered 2010-12-05: 50 ug/kg/min via INTRAVENOUS

## 2010-12-05 MED ORDER — FENTANYL CITRATE 0.05 MG/ML IJ SOLN: 25.0000 ug | INTRAMUSCULAR | Status: DC | PRN

## 2010-12-05 MED ORDER — ONDANSETRON HCL 4 MG/2ML IJ SOLN
INTRAMUSCULAR | Status: DC | PRN
  Administered 2010-12-05: 4 mg via INTRAVENOUS

## 2010-12-05 MED ORDER — PROPOFOL 10 MG/ML IV EMUL
INTRAVENOUS | Status: DC | PRN
  Administered 2010-12-05: 40 mg via INTRAVENOUS

## 2010-12-05 MED ORDER — BUPIVACAINE HCL (PF) 0.25 % IJ SOLN
INTRAMUSCULAR | Status: DC | PRN
  Administered 2010-12-05: 2 mL

## 2010-12-05 MED ORDER — OXYCODONE-ACETAMINOPHEN 5-325 MG PO TABS: 1.0000 | ORAL_TABLET | ORAL | Status: AC | PRN

## 2010-12-05 MED ORDER — DOXYCYCLINE HYCLATE 100 MG PO TABS: 100.0000 mg | ORAL_TABLET | Freq: Two times a day (BID) | ORAL | Status: AC

## 2010-12-05 MED ORDER — LIDOCAINE HCL (CARDIAC) 20 MG/ML IV SOLN
INTRAVENOUS | Status: DC | PRN
  Administered 2010-12-05: 30 mg via INTRAVENOUS

## 2010-12-05 SURGICAL SUPPLY — 45 items
BANDAGE ADHESIVE 1X3 (GAUZE/BANDAGES/DRESSINGS) IMPLANT
BANDAGE COBAN STERILE 2 (GAUZE/BANDAGES/DRESSINGS) IMPLANT
BANDAGE CONFORM 3  STR LF (GAUZE/BANDAGES/DRESSINGS) IMPLANT
BLADE MINI RND TIP GREEN BEAV (BLADE) ×2 IMPLANT
BLADE SURG 15 STRL LF DISP TIS (BLADE) ×1 IMPLANT
BLADE SURG 15 STRL SS (BLADE) ×1
BNDG COHESIVE 1X5 TAN STRL LF (GAUZE/BANDAGES/DRESSINGS) ×2 IMPLANT
BNDG ESMARK 4X9 LF (GAUZE/BANDAGES/DRESSINGS) IMPLANT
BRUSH SCRUB EZ PLAIN DRY (MISCELLANEOUS) ×2 IMPLANT
CLOTH BEACON ORANGE TIMEOUT ST (SAFETY) ×2 IMPLANT
CORDS BIPOLAR (ELECTRODE) ×2 IMPLANT
COVER MAYO STAND STRL (DRAPES) ×2 IMPLANT
COVER TABLE BACK 60X90 (DRAPES) ×2 IMPLANT
CUFF TOURNIQUET SINGLE 18IN (TOURNIQUET CUFF) IMPLANT
DECANTER SPIKE VIAL GLASS SM (MISCELLANEOUS) IMPLANT
DRAPE EXTREMITY T 121X128X90 (DRAPE) ×2 IMPLANT
DRAPE SURG 17X23 STRL (DRAPES) ×2 IMPLANT
GAUZE XEROFORM 1X8 LF (GAUZE/BANDAGES/DRESSINGS) ×2 IMPLANT
GLOVE BIOGEL M STRL SZ7.5 (GLOVE) ×2 IMPLANT
GLOVE ECLIPSE 6.5 STRL STRAW (GLOVE) ×4 IMPLANT
GLOVE ORTHO TXT STRL SZ7.5 (GLOVE) ×2 IMPLANT
GOWN PREVENTION PLUS XLARGE (GOWN DISPOSABLE) ×2 IMPLANT
GOWN PREVENTION PLUS XXLARGE (GOWN DISPOSABLE) ×4 IMPLANT
LOOP VESSEL MAXI BLUE (MISCELLANEOUS) IMPLANT
NEEDLE 27GAX1X1/2 (NEEDLE) ×2 IMPLANT
NEEDLE HYPO 25X1 1.5 SAFETY (NEEDLE) IMPLANT
NS IRRIG 1000ML POUR BTL (IV SOLUTION) ×2 IMPLANT
PACK BASIN DAY SURGERY FS (CUSTOM PROCEDURE TRAY) ×2 IMPLANT
PAD CAST 3X4 CTTN HI CHSV (CAST SUPPLIES) IMPLANT
PADDING CAST ABS 4INX4YD NS (CAST SUPPLIES) ×1
PADDING CAST ABS COTTON 4X4 ST (CAST SUPPLIES) ×1 IMPLANT
PADDING CAST COTTON 3X4 STRL (CAST SUPPLIES)
SPONGE GAUZE 4X4 12PLY (GAUZE/BANDAGES/DRESSINGS) ×2 IMPLANT
SPONGE GAUZE 4X4 FOR O.R. (GAUZE/BANDAGES/DRESSINGS) ×2 IMPLANT
STOCKINETTE 4X48 STRL (DRAPES) ×2 IMPLANT
SUT CHROMIC 6 0 CE2 363 13 (SUTURE) ×2 IMPLANT
SUT PROLENE 3 0 PS 2 (SUTURE) ×2 IMPLANT
SUT VIC AB 4-0 P2 18 (SUTURE) IMPLANT
SYR 3ML 23GX1 SAFETY (SYRINGE) IMPLANT
SYR BULB 3OZ (MISCELLANEOUS) ×2 IMPLANT
SYR CONTROL 10ML LL (SYRINGE) IMPLANT
TOWEL OR 17X24 6PK STRL BLUE (TOWEL DISPOSABLE) ×4 IMPLANT
TRAY DSU PREP LF (CUSTOM PROCEDURE TRAY) ×2 IMPLANT
UNDERPAD 30X30 INCONTINENT (UNDERPADS AND DIAPERS) ×2 IMPLANT
WATER STERILE IRR 1000ML POUR (IV SOLUTION) ×2 IMPLANT

## 2010-12-05 NOTE — Addendum Note (Signed)
Addendum  created 12/05/10 1209 by Bedelia Person, MD   Modules edited:Notes Section

## 2010-12-05 NOTE — Brief Op Note (Signed)
12/05/2010  11:24 AM  PATIENT:  Jill Myers  77 y.o. female  PRE-OPERATIVE DIAGNOSIS:  melano nychia of right hallux nail  POST-OPERATIVE DIAGNOSIS:  same as preop  PROCEDURE:  Procedure(s): EN BLOC NAIL MECHANISM BIOPSY OF RIGHT HALLUX ,  ISLAND PEDICLE SHIFT RECONSTRUCTION OF NAIL MECHANISM RIGHT HALLUX   SURGEON:  Surgeon(s): Wyn Forster., MD  PHYSICIAN ASSISTANT:   ASSISTANTS: Dreamer Carillo Dasnoit, P.A-C  ANESTHESIA:   local  EBL:     BLOOD ADMINISTERED:none  DRAINS: none   LOCAL MEDICATIONS USED:  XYLOCAINE 4.5 CC  SPECIMEN:  Excision  DISPOSITION OF SPECIMEN:  PATHOLOGY  COUNTS:  YES  TOURNIQUET:  * No tourniquets in log *  DICTATION: .Other Dictation: Dictation Number [id number]  PLAN OF CARE: Discharge to home after PACU  PATIENT DISPOSITION:  PACU - hemodynamically stable.

## 2010-12-05 NOTE — Op Note (Signed)
Op note dictated  12/05/10  MRN:  [id number] CSN [id number] [phone]

## 2010-12-05 NOTE — Anesthesia Preprocedure Evaluation (Signed)
Anesthesia Evaluation  Patient identified by MRN, date of birth, ID band Patient awake    Reviewed: Allergy & Precautions, H&P , NPO status , Patient's Chart, lab work & pertinent test results  Airway Mallampati: I TM Distance: >3 FB     Dental   Pulmonary    Pulmonary exam normal       Cardiovascular     Neuro/Psych    GI/Hepatic GERD-  ,  Endo/Other    Renal/GU      Musculoskeletal   Abdominal   Peds  Hematology   Anesthesia Other Findings   Reproductive/Obstetrics                           Anesthesia Physical Anesthesia Plan  ASA: II  Anesthesia Plan: MAC   Post-op Pain Management:    Induction: Intravenous  Airway Management Planned: Simple Face Mask  Additional Equipment:   Intra-op Plan:   Post-operative Plan:   Informed Consent: I have reviewed the patients History and Physical, chart, labs and discussed the procedure including the risks, benefits and alternatives for the proposed anesthesia with the patient or authorized representative who has indicated his/her understanding and acceptance.     Plan Discussed with: CRNA and Surgeon  Anesthesia Plan Comments:         Anesthesia Quick Evaluation

## 2010-12-05 NOTE — Transfer of Care (Signed)
Immediate Anesthesia Transfer of Care Note  Patient: Jill Myers  Procedure(s) Performed:  NAILBED REPAIR - full thickness nail biopsy right hallux  Patient Location: PACU  Anesthesia Type: MAC  Level of Consciousness: awake, alert  and oriented  Airway & Oxygen Therapy: Patient Spontanous Breathing and Patient connected to face mask oxygen  Post-op Assessment: Report given to PACU RN and Post -op Vital signs reviewed and stable  Post vital signs: Reviewed and stable  Complications: No apparent anesthesia complications

## 2010-12-05 NOTE — Anesthesia Procedure Notes (Addendum)
Anesthesia Regional Block:   Narrative:       Narrative:     Digital block by surgeon

## 2010-12-05 NOTE — Anesthesia Postprocedure Evaluation (Signed)
  Anesthesia Post-op Note  Patient: Jill Myers  Procedure(s) Performed:  NAILBED REPAIR - full thickness nail biopsy right hallux  Patient Location: PACU  Anesthesia Type: Regional  Level of Consciousness: awake, alert  and oriented  Airway and Oxygen Therapy: Patient Spontanous Breathing  Post-op Pain: mild  Post-op Assessment: Post-op Vital signs reviewed, Patient's Cardiovascular Status Stable, Respiratory Function Stable, Patent Airway, No signs of Nausea or vomiting, Adequate PO intake and Pain level controlled  Post-op Vital Signs: stable  Complications: No apparent anesthesia complications

## 2010-12-05 NOTE — Anesthesia Postprocedure Evaluation (Signed)
  Anesthesia Post-op Note  Patient: Jill Myers  Procedure(s) Performed:  NAILBED REPAIR - full thickness nail biopsy right hallux  Patient Location: PACU  Anesthesia Type: General  Level of Consciousness: awake, alert  and oriented  Airway and Oxygen Therapy: Patient Spontanous Breathing  Post-op Pain: mild  Post-op Assessment: Post-op Vital signs reviewed, Patient's Cardiovascular Status Stable, Respiratory Function Stable, Patent Airway, No signs of Nausea or vomiting and Pain level controlled  Post-op Vital Signs: stable  Complications: No apparent anesthesia complications

## 2010-12-06 ENCOUNTER — Encounter (HOSPITAL_BASED_OUTPATIENT_CLINIC_OR_DEPARTMENT_OTHER): Payer: Self-pay | Admitting: Orthopedic Surgery

## 2010-12-06 NOTE — Op Note (Signed)
[name], [name] NO.:  [id number]  MEDICAL RECORD NO.:  [id number]  LOCATION:  WOC                          FACILITY:  [facility]  PHYSICIAN:  [name]. Zyliah Schier, M.D. DATE OF BIRTH:  [birth year]  DATE OF PROCEDURE:  12/05/2010 DATE OF DISCHARGE:  10/11/2010                              OPERATIVE REPORT   PREOPERATIVE DIAGNOSIS:  A 3-mm wide melanonychia new onset right hallux nail mechanism.  POSTOPERATIVE DIAGNOSIS:  A 3-mm wide melanonychia new onset right hallux nail mechanism.  OPERATION:  Full-thickness en bloc nail mechanism biopsy right hallux followed by island pedicle reconstruction of nail mechanism.  OPERATING SURGEON:  [name]. Quanda Pavlicek, MD  ASSISTANT:  Annye Rusk, PA-C  ANESTHESIA:  Lidocaine 2% metatarsal head level block of right hallux supplemented by IV sedation with subsequent 2 mL postoperative Marcaine block for comfort.  Monitored anesthesia care.  SUPERVISING ANESTHESIOLOGIST:  Bedelia Person, MD  INDICATIONS:  Jill Myers is a 77 year old woman referred through the courtesy of Dr. [name], dermatologist for evaluation of new onset wide, dark melanonychia of the right hallux.  Within the past few months, Jill Myers noted a very dark brown streak in her right great toenail, which had not been present throughout her life. She could not recall specific trauma to the nail.  She saw Dr. Leonie Man for a consult and was advised to seek nail biopsy to rule out melanoma or an atypical melanocytic lesion.  Detailed informed consent was accomplished in the office.  We pointed out that she would have a ridge in her nail following biopsy.  We will attempt to reconstruct the nail mechanism by an [place] shift with a relaxing incision on the medial aspect of the great toe sliding the entire medial aspect of the nail matrix over to repair the lateral nail mechanism.  She understands that this would be quite uncomfortable in the  immediate postoperative period and will be treated with a Marcaine metatarsal head level block for comfort.  She will need to elevate her toe and foot for several days for comfort. She will walk in either a sandal or a fracture shoe postoperatively.  She was advised that should this turn out to be a mitotic lesion that will need to return for wide resection and grafting.  If the lesion turns out to be a certain depth, she may also face a partial toe amputation.  Questions regarding predicament were invited and answered in detail.  PROCEDURE:  Jill Myers was brought to room 2 of the [facility] and placed in supine position upon the table.  Preoperatively, Dr. Gypsy Balsam provided detailed anesthesia and informed consent advising to undergo a digital block of the toe with sedation.  After routine surgical time-out, the foot was prepped with Betadine and 4.5 mL of 2% lidocaine was placed at metatarsal head level to obtain a digital block.  The right foot and toes were then prepped thoroughly with Betadine soap and solution, and sterilely draped with sterile stockinette, and an arthroscopy sheath.  Procedure commenced with exsanguination of the hallux followed by placement of a 0.5 inch Penrose drain at the base of the toe as  a digital tourniquet.  The area of wide resection was marked to excise entire melena streak as well as the terminal nail matrix proximally at the dorsal toe nail fold.  The skin incisions were taken sharply to bone and a 64 Beaver blade was used to elevate the entire nail mechanism to the length of the biopsy including a portion of the hyponychium.  This was marked with a Prolene suture at the site of maximum melanin staining, placed in formalin, and passed off for pathologic evaluation at the Dermatopathology office.  The medial aspect of the nail matrix was then meticulously elevated off the hallux distal phalanx with a 64 Beaver blade and a razor  sharp 4-mm osteotome.  A relaxing incision was fashioned on the medial aspect of the hallux, and the entire nail mechanism and the surrounding nail fold was transferred 3 mm laterally.  This was then reconstructed with multiple figure-of-eight sutures of 5-0 and 6-0 chromic bringing together the 2 halves of the nail mechanism.  The toenail had been soaked in Betadine for 15 minutes, and subsequently clear soft tissues narrowed and placed beneath the dorsal nail fold.  A satisfactory reconstruction was achieved.  Jill Myers fully understands that she will have a crease in her nail and will not have a completely normal nail.  Should this be a benign lentigo, she should have no difficulty with shoeing.  The wound was then dressed with Xeroflo, sterile gauze, and an ankle based 1 inch Coban dressing and secured with tape.  There were no apparent complications.     [name] Shari Natt, M.D.     RVS/MEDQ  D:  12/05/2010  T:  12/05/2010  Job:  [id number]  cc:   [name], MD

## 2010-12-12 ENCOUNTER — Ambulatory Visit (INDEPENDENT_AMBULATORY_CARE_PROVIDER_SITE_OTHER): Payer: Medicare Other | Admitting: Home Health Services

## 2010-12-12 VITALS — BP 165/80 | HR 76 | Temp 97.9°F | Ht 64.25 in | Wt 132.0 lb

## 2010-12-12 DIAGNOSIS — Z0000 Encounter for general adult medical examination without abnormal findings: Secondary | ICD-10-CM

## 2010-12-13 ENCOUNTER — Encounter: Payer: Self-pay | Admitting: Home Health Services

## 2010-12-13 NOTE — Progress Notes (Signed)
Patient here for annual wellness visit, patient reports: Risk Factors/Conditions needing evaluation or treatment: Pt does not have any risk factors that need evaluation. Home Safety: Pt lives by self in 2 story home. Pt reports having smoke dectors and adaptive equipment in bathroom. Other Information: Corrective lens: Pt wears daily corrective lens and visits eye doctor annually. Dentures: Pt has a bridge and visits dentist annually. Memory: Pt denies memory problems. Patient's Mini Mental Score (recorded in doc. flowsheet): 29  Balance/Gait: Pt recent has minor procedure done on left toe and is currently using walking shoe. Balance Abnormal Patient value  Sitting balance    Sit to stand    Attempts to arise    Immediate standing balance    Standing balance    Nudge    Eyes closed- Romberg    Tandem stance    Back lean    Neck Rotation    360 degree turn    Sitting down     Gait Abnormal Patient value  Initiation of gait    Step length-left    Step length-right    Step height-left    Step height-right    Step symmetry    Step continuity    Path deviation    Trunk movement    Walking stance        Annual Wellness Visit Requirements Recorded Today In  Medical, family, social history Past Medical, Family, Social History Section  Current providers Care team  Current medications Medications  Wt, BP, Ht, BMI Vital signs  Hearing assessment (welcome visit) Hearing/vision  Tobacco, alcohol, illicit drug use History  ADL Nurse Assessment  Depression Screening Nurse Assessment  Cognitive impairment Nurse Assessment  Mini Mental Status Document Flowsheet  Fall Risk Nurse Assessment  Home Safety Progress Note  End of Life Planning (welcome visit) Social Documentation  Medicare preventative services Progress Note  Risk factors/conditions needing evaluation/treatment Progress Note  Personalized health advice Patient Instructions, goals, letter  Diet & Exercise Social  Documentation  Emergency Contact Social Documentation  Seat Belts Social Documentation  Sun exposure/protection Social Documentation    Prevention Plan:   Recommended Medicare Prevention Screenings Women over 65 Test For Frequency Date of Last- BOLD if needed  Breast Cancer 1-2 yrs 12/11  Cervical Cancer 1-3 yrs Not indicated  Colorectal Cancer 1-10 yrs 11/08  Osteoporosis once 2/09  Cholesterol 5 yrs 8/12  Diabetes yearly 8/12  HIV yearly declined  Influenza Shot yearly 10/12  Pneumonia Shot once 2/10  Zostavax Shot once 10/09

## 2010-12-13 NOTE — Patient Instructions (Addendum)
1. Continue to exercise 3 times a week. 2. Continue to plan social activities. 3. Focus on eating 4-5 fruits and vegetables a day. 4. Continue with your plan to write down next day activities the evening before.

## 2010-12-16 ENCOUNTER — Encounter: Payer: Self-pay | Admitting: Home Health Services

## 2010-12-16 NOTE — Progress Notes (Signed)
  Subjective:    Patient ID: Jill Myers, female    DOB: Jan 16, 1930, 77 y.o.   MRN: [id number]  HPI    Review of Systems     Objective:   Physical Exam        Assessment & Plan:  I have reviewed this visit and discussed with Jill Myers and agree with her documentation.  Jill Myers O

## 2011-01-10 ENCOUNTER — Other Ambulatory Visit: Payer: Self-pay | Admitting: Family Medicine

## 2011-01-10 DIAGNOSIS — Z1231 Encounter for screening mammogram for malignant neoplasm of breast: Secondary | ICD-10-CM

## 2011-01-16 ENCOUNTER — Other Ambulatory Visit: Payer: Self-pay | Admitting: Oncology

## 2011-01-16 ENCOUNTER — Other Ambulatory Visit (HOSPITAL_BASED_OUTPATIENT_CLINIC_OR_DEPARTMENT_OTHER): Payer: Medicare Other

## 2011-01-16 DIAGNOSIS — D473 Essential (hemorrhagic) thrombocythemia: Secondary | ICD-10-CM

## 2011-01-16 LAB — CBC WITH DIFFERENTIAL/PLATELET
BASO%: 0.4 % (ref 0.0–2.0)
EOS%: 0.8 % (ref 0.0–7.0)
HCT: 35.2 % (ref 34.8–46.6)
LYMPH%: 33.9 % (ref 14.0–49.7)
MCH: 39.5 pg — ABNORMAL HIGH (ref 25.1–34.0)
MCHC: 34.9 g/dL (ref 31.5–36.0)
MCV: 113.1 fL — ABNORMAL HIGH (ref 79.5–101.0)
NEUT%: 54.1 % (ref 38.4–76.8)
Platelets: 449 10*3/uL — ABNORMAL HIGH (ref 145–400)

## 2011-01-16 LAB — MORPHOLOGY: PLT EST: INCREASED

## 2011-01-19 ENCOUNTER — Other Ambulatory Visit: Payer: Self-pay | Admitting: Oncology

## 2011-01-19 DIAGNOSIS — D759 Disease of blood and blood-forming organs, unspecified: Secondary | ICD-10-CM

## 2011-01-20 ENCOUNTER — Telehealth: Payer: Self-pay | Admitting: Oncology

## 2011-01-20 NOTE — Telephone Encounter (Signed)
Added appt for 2/7. lmonvm informing pt and also confirmed 3/11 appt. Schedule mailed.

## 2011-01-21 ENCOUNTER — Telehealth: Payer: Self-pay | Admitting: *Deleted

## 2011-01-21 NOTE — Telephone Encounter (Signed)
Pt notified to stay on same dose of hydrea per Dr. Cyndie Chime & cont. q mo lab.  She reports that she was on qo mo. & just wondered why she was changed.  Reported that her platelets have gradually been creeping up although not alarming & this could be the reason Dr. Cyndie Chime wants to check in 67mo.  She was OK with this.

## 2011-01-31 ENCOUNTER — Other Ambulatory Visit: Payer: Self-pay | Admitting: Oncology

## 2011-01-31 ENCOUNTER — Encounter: Payer: Self-pay | Admitting: Oncology

## 2011-01-31 DIAGNOSIS — D473 Essential (hemorrhagic) thrombocythemia: Secondary | ICD-10-CM

## 2011-01-31 HISTORY — DX: Essential (hemorrhagic) thrombocythemia: D47.3

## 2011-02-06 ENCOUNTER — Ambulatory Visit (HOSPITAL_COMMUNITY)
Admission: RE | Admit: 2011-02-06 | Discharge: 2011-02-06 | Disposition: A | Discharge status: Home / Self Care | Payer: Medicare Other | Source: Ambulatory Visit | Attending: Family Medicine | Admitting: Family Medicine

## 2011-02-06 DIAGNOSIS — Z1231 Encounter for screening mammogram for malignant neoplasm of breast: Secondary | ICD-10-CM

## 2011-02-13 ENCOUNTER — Other Ambulatory Visit (HOSPITAL_BASED_OUTPATIENT_CLINIC_OR_DEPARTMENT_OTHER): Payer: Medicare Other | Admitting: Lab

## 2011-02-13 DIAGNOSIS — D759 Disease of blood and blood-forming organs, unspecified: Secondary | ICD-10-CM

## 2011-02-13 LAB — CBC WITH DIFFERENTIAL/PLATELET
Basophils Absolute: 0 10*3/uL (ref 0.0–0.1)
EOS%: 1.1 % (ref 0.0–7.0)
Eosinophils Absolute: 0 10*3/uL (ref 0.0–0.5)
HCT: 36.7 % (ref 34.8–46.6)
HGB: 12.5 g/dL (ref 11.6–15.9)
MCH: 38.8 pg — ABNORMAL HIGH (ref 25.1–34.0)
MONO#: 0.6 10*3/uL (ref 0.1–0.9)
NEUT#: 2.3 10*3/uL (ref 1.5–6.5)
NEUT%: 51.4 % (ref 38.4–76.8)
RDW: 13.5 % (ref 11.2–14.5)
WBC: 4.4 10*3/uL (ref 3.9–10.3)
lymph#: 1.5 10*3/uL (ref 0.9–3.3)

## 2011-02-14 ENCOUNTER — Telehealth: Payer: Self-pay | Admitting: *Deleted

## 2011-02-14 NOTE — Telephone Encounter (Signed)
Notified pt to stay on same dose of hydrea per [name] NP.  She is taking 1000mg  daily & next appt is 03/17/11 for lab & MD.

## 2011-03-17 ENCOUNTER — Ambulatory Visit (HOSPITAL_BASED_OUTPATIENT_CLINIC_OR_DEPARTMENT_OTHER): Payer: Medicare Other | Admitting: Oncology

## 2011-03-17 ENCOUNTER — Telehealth: Payer: Self-pay | Admitting: Oncology

## 2011-03-17 ENCOUNTER — Other Ambulatory Visit (HOSPITAL_BASED_OUTPATIENT_CLINIC_OR_DEPARTMENT_OTHER): Payer: Medicare Other | Admitting: Lab

## 2011-03-17 VITALS — BP 131/78 | HR 72 | Temp 97.1°F | Ht 64.25 in | Wt 134.2 lb

## 2011-03-17 DIAGNOSIS — D693 Immune thrombocytopenic purpura: Secondary | ICD-10-CM

## 2011-03-17 DIAGNOSIS — D473 Essential (hemorrhagic) thrombocythemia: Secondary | ICD-10-CM

## 2011-03-17 DIAGNOSIS — D6949 Other primary thrombocytopenia: Secondary | ICD-10-CM

## 2011-03-17 DIAGNOSIS — D47Z9 Other specified neoplasms of uncertain behavior of lymphoid, hematopoietic and related tissue: Secondary | ICD-10-CM

## 2011-03-17 LAB — CBC WITH DIFFERENTIAL/PLATELET
Basophils Absolute: 0 10*3/uL (ref 0.0–0.1)
EOS%: 0.5 % (ref 0.0–7.0)
Eosinophils Absolute: 0 10*3/uL (ref 0.0–0.5)
HCT: 37.3 % (ref 34.8–46.6)
HGB: 12.8 g/dL (ref 11.6–15.9)
MCH: 39.1 pg — ABNORMAL HIGH (ref 25.1–34.0)
MCV: 113.8 fL — ABNORMAL HIGH (ref 79.5–101.0)
MONO%: 12.8 % (ref 0.0–14.0)
NEUT#: 2.2 10*3/uL (ref 1.5–6.5)
NEUT%: 53.3 % (ref 38.4–76.8)
Platelets: 500 10*3/uL — ABNORMAL HIGH (ref 145–400)
RDW: 13.3 % (ref 11.2–14.5)

## 2011-03-17 LAB — COMPREHENSIVE METABOLIC PANEL
ALT: 20 U/L (ref 0–35)
AST: 22 U/L (ref 0–37)
Albumin: 4.4 g/dL (ref 3.5–5.2)
Alkaline Phosphatase: 49 U/L (ref 39–117)
Calcium: 9.3 mg/dL (ref 8.4–10.5)
Chloride: 99 mEq/L (ref 96–112)
Potassium: 4 mEq/L (ref 3.5–5.3)
Sodium: 136 mEq/L (ref 135–145)
Total Protein: 6.9 g/dL (ref 6.0–8.3)

## 2011-03-17 LAB — MORPHOLOGY

## 2011-03-17 NOTE — Telephone Encounter (Signed)
gv pt appt schedule for may thru sept °

## 2011-03-17 NOTE — Progress Notes (Signed)
Hematology and Oncology Follow Up Visit  Jill Myers [id number] [birth year] 78 y.o. 03/17/2011 8:22 PM   Principle Diagnosis: Encounter Diagnosis  Name Primary?  . Essential thrombocytopenia Yes     Interim History:   Followup visit for this 78 year old woman with essential thrombocythemia diagnosed in April 2005. She has never had any thrombotic or neurologic events. Disease has been controlled with oral Hydrea and low-dose  aspirin.. Current dose of Hydrea 1000 mg daily. Platelet counts have been running in the 400-450,000 range except for today's value which is a little bit higher at 500,000. Hemoglobin has been stable over 12 g and is 12.8 today. White count is low normal on the Hydrea today 4100 53% neutrophils 33 lymphocytes 13 monocytes. She has had no interim medical problems. She denies any headache or change in vision. No dyspnea no chest pain no abdominal pain or change in bowel habit no bone pain, no focal weakness or slurred speech.  Medications: reviewed  Allergies:  Allergies  Allergen Reactions  . Sulfamethoxazole W/Trimethoprim     REACTION: Rash after completing course of Septra    Review of Systems: Constitutional:   No constitutional symptoms Respiratory: See above Cardiovascular:  See above Gastrointestinal: See above Genito-Urinary:  Musculoskeletal: See above Neurologic: See above Skin: Remaining ROS negative.  Physical Exam: Blood pressure 131/78, pulse 72, temperature 97.1 F (36.2 C), temperature source Oral, height 5' 4.25" (1.632 m), weight 134 lb 3.2 oz (60.873 kg). Wt Readings from Last 3 Encounters:  03/17/11 134 lb 3.2 oz (60.873 kg)  12/12/10 132 lb (59.875 kg)  12/05/10 130 lb (58.968 kg)     General appearance: 12 Caucasian woman HENNT: Pharynx no erythema or exudate Lymph nodes: No lymphadenopathy Breasts: Not examined Lungs: Clear to auscultation resonant to percussion Heart: Regular rhythm no murmur Abdomen: Soft nontender no  mass no organomegaly Extremities: No edema no calf tenderness Vascular: No cyanosis Neurologic: Pupils equal reactive to light optic discs sharp, motor strength 5 over 5, reflexes 1+ symmetric, Skin: No rash or ecchymosis  Lab Results: Lab Results  Component Value Date   WBC 4.1 03/17/2011   HGB 12.8 03/17/2011   HCT 37.3 03/17/2011   MCV 113.8* 03/17/2011   PLT 500* 03/17/2011     Chemistry      Component Value Date/Time   NA 136 03/17/2011 1153   K 4.0 03/17/2011 1153   CL 99 03/17/2011 1153   CO2 30 03/17/2011 1153   BUN 15 03/17/2011 1153   CREATININE 0.72 03/17/2011 1153   CREATININE 0.77 08/16/2010 0935      Component Value Date/Time   CALCIUM 9.3 03/17/2011 1153   ALKPHOS 49 03/17/2011 1153   AST 22 03/17/2011 1153   ALT 20 03/17/2011 1153   BILITOT 0.4 03/17/2011 1153       Impression and Plan: #1. Essential thrombocythemia controlled on Hydrea and low dose aspirin plan continue the same. We are checking lab every 2 months.  #2. Ligamentous laxity of the pelvis  #3. Essential hypertension  #4. Osteoporosis.  #5. Diverticulosis   CC:. Dr. Paula Compton   Levert Feinstein, MD 3/11/20138:22 PM

## 2011-04-20 ENCOUNTER — Other Ambulatory Visit: Payer: Self-pay | Admitting: Family Medicine

## 2011-04-23 NOTE — Telephone Encounter (Signed)
Printed and faxed to [facility], [place]. Paula Compton, MD

## 2011-05-15 ENCOUNTER — Other Ambulatory Visit (HOSPITAL_BASED_OUTPATIENT_CLINIC_OR_DEPARTMENT_OTHER): Payer: Medicare Other | Admitting: Lab

## 2011-05-15 ENCOUNTER — Telehealth: Payer: Self-pay | Admitting: *Deleted

## 2011-05-15 DIAGNOSIS — D693 Immune thrombocytopenic purpura: Secondary | ICD-10-CM

## 2011-05-15 DIAGNOSIS — D6949 Other primary thrombocytopenia: Secondary | ICD-10-CM

## 2011-05-15 LAB — CBC WITH DIFFERENTIAL/PLATELET
BASO%: 0.3 % (ref 0.0–2.0)
HCT: 34.5 % — ABNORMAL LOW (ref 34.8–46.6)
LYMPH%: 37.6 % (ref 14.0–49.7)
MCH: 39 pg — ABNORMAL HIGH (ref 25.1–34.0)
MCHC: 34.4 g/dL (ref 31.5–36.0)
MCV: 113.4 fL — ABNORMAL HIGH (ref 79.5–101.0)
MONO#: 0.5 10*3/uL (ref 0.1–0.9)
MONO%: 12 % (ref 0.0–14.0)
NEUT%: 49.1 % (ref 38.4–76.8)
Platelets: 417 10*3/uL — ABNORMAL HIGH (ref 145–400)
RBC: 3.04 10*6/uL — ABNORMAL LOW (ref 3.70–5.45)
WBC: 4.3 10*3/uL (ref 3.9–10.3)

## 2011-05-15 LAB — COMPREHENSIVE METABOLIC PANEL
ALT: 20 U/L (ref 0–35)
AST: 17 U/L (ref 0–37)
Albumin: 4.1 g/dL (ref 3.5–5.2)
BUN: 20 mg/dL (ref 6–23)
CO2: 26 mEq/L (ref 19–32)
Calcium: 9.1 mg/dL (ref 8.4–10.5)
Chloride: 101 mEq/L (ref 96–112)
Potassium: 3.9 mEq/L (ref 3.5–5.3)

## 2011-05-15 LAB — MORPHOLOGY

## 2011-05-15 LAB — LACTATE DEHYDROGENASE: LDH: 139 U/L (ref 94–250)

## 2011-05-15 NOTE — Telephone Encounter (Signed)
Message copied by Sabino Snipes on Thu May 15, 2011  5:21 PM ------      Message from: Levert Feinstein      Created: Thu May 15, 2011  2:22 PM       Call pt - same dose Hydrea

## 2011-05-15 NOTE — Telephone Encounter (Signed)
Pt notified to stay on same dose of hydrea = 1000mg  daily

## 2011-06-09 ENCOUNTER — Encounter: Payer: Self-pay | Admitting: Family Medicine

## 2011-06-09 ENCOUNTER — Ambulatory Visit (INDEPENDENT_AMBULATORY_CARE_PROVIDER_SITE_OTHER): Payer: Medicare Other | Admitting: Family Medicine

## 2011-06-09 VITALS — BP 142/70 | HR 84 | Ht 64.0 in | Wt 136.0 lb

## 2011-06-09 DIAGNOSIS — G2581 Restless legs syndrome: Secondary | ICD-10-CM

## 2011-06-09 DIAGNOSIS — D6949 Other primary thrombocytopenia: Secondary | ICD-10-CM

## 2011-06-09 DIAGNOSIS — R5383 Other fatigue: Secondary | ICD-10-CM

## 2011-06-09 DIAGNOSIS — D693 Immune thrombocytopenic purpura: Secondary | ICD-10-CM

## 2011-06-09 DIAGNOSIS — R718 Other abnormality of red blood cells: Secondary | ICD-10-CM

## 2011-06-09 DIAGNOSIS — I10 Essential (primary) hypertension: Secondary | ICD-10-CM

## 2011-06-09 DIAGNOSIS — N3941 Urge incontinence: Secondary | ICD-10-CM

## 2011-06-09 DIAGNOSIS — K219 Gastro-esophageal reflux disease without esophagitis: Secondary | ICD-10-CM

## 2011-06-09 DIAGNOSIS — R5381 Other malaise: Secondary | ICD-10-CM

## 2011-06-09 HISTORY — DX: Restless legs syndrome: G25.81

## 2011-06-09 MED ORDER — ROPINIROLE HCL 0.25 MG PO TABS: 0.2500 mg | ORAL_TABLET | Freq: Three times a day (TID) | ORAL | Status: DC

## 2011-06-09 MED ORDER — HYDROCHLOROTHIAZIDE 25 MG PO TABS: 25.0000 mg | ORAL_TABLET | Freq: Every day | ORAL | Status: DC

## 2011-06-09 MED ORDER — SOLIFENACIN SUCCINATE 5 MG PO TABS: 10.0000 mg | ORAL_TABLET | Freq: Every day | ORAL | Status: DC

## 2011-06-09 NOTE — Assessment & Plan Note (Signed)
Has continued with RLS, for which she has taken Klonopin.  Not much better with this.  Would like to try something else.  Will change to Requip at bedtime; follow up in 1 month. Checking iron level and TSH for secondary causes.

## 2011-06-09 NOTE — Assessment & Plan Note (Signed)
Reasonable control for her age.  Continue current management.

## 2011-06-09 NOTE — Assessment & Plan Note (Signed)
Mildly better with Vesicare 5mg ; will increase to 10mg  daily.  Seen by Gyn, not felt necessary to use Pessary.

## 2011-06-09 NOTE — Patient Instructions (Signed)
It was a pleasure to see you today.  I am prescribing the Requip to be taken at bedtime until I see you back in follow up in 1 month.

## 2011-06-09 NOTE — Progress Notes (Signed)
  Subjective:    Patient ID: Jill Myers, female    DOB: [birth year], 78 y.o.   MRN: [id number]  HPI Seen today as work-in.  Feeling well. For several issues;   1. Continues with mild urinary incontinence.  Seen by Gyn, not thought to be candidate for pessary.  Vesicare 5mg  helpful, but still leaks.  No dysuria or change in urine appearance.  Better when she's busy.   2. No chest pain or dyspnea; continues with HCTZ for HTN.  No dizziness or falls.   3. RLS is acting up.  Trouble sleeping due to bilateral leg aches, not cramps.  Worse in L than R, but present in both.  Never had thyroid checked.  Has CBC done by Dr. Cyndie Chime regularly; Hgb usually around 12.   4. Thinking of moving to ALF.  Has her granddaughter visiting from [place] this summer, then likely move.  Is anticipating this.   5. GERD symptoms, never took omeprazole regularly. No sxs when eating light.  Sometimes has in the morning.   6. Weak "old lady" voice.  No hoarseness.  No sputum production.  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    Review of Systems See above    Objective:   Physical Exam  Well appearing, no apparent distress Neck supple. No adenopathy.      Assessment & Plan:

## 2011-06-10 ENCOUNTER — Telehealth: Payer: Self-pay | Admitting: Family Medicine

## 2011-06-10 NOTE — Telephone Encounter (Signed)
I called cell, no answer.  I called home, left voice message. I wish to relay results of normal labs done yesterday, also to discuss instructions for Requip, specifically, how to titrate dosage up for RLS therapy.  Paula Compton, MD

## 2011-06-10 NOTE — Telephone Encounter (Signed)
Patient is returning call to Dr. Mauricio Po.

## 2011-06-11 NOTE — Telephone Encounter (Signed)
Called patient back, reviewed instructions for ropinirole for RLS.  She is to take 0.25mg  at bedtime for at least 3 to 5 days; may increase by 0.25mg  every 5 days to a maximum dose of 1mg /day.  She is to call our office once she is titrated to an effective dose, so that the proper amount of medication can be sent to her pharmacy.  Concurrently, she is going to split her clonazepam 0.5mg  tablets in half for the coming 6 days, taking only 0.25mg /night.   She is taking omeprazole for the coming 14 days, to see if helps her GERD.  Had GERD symptoms this morning at work but "was too busy to reach for the TUMS".  She muses that this might be a sign it wasn't too bad to begin with.   Plan for follow up in 1 month.  Paula Compton, MD

## 2011-07-11 ENCOUNTER — Other Ambulatory Visit (HOSPITAL_BASED_OUTPATIENT_CLINIC_OR_DEPARTMENT_OTHER): Payer: Medicare Other | Admitting: Lab

## 2011-07-11 DIAGNOSIS — D693 Immune thrombocytopenic purpura: Secondary | ICD-10-CM

## 2011-07-11 DIAGNOSIS — D6949 Other primary thrombocytopenia: Secondary | ICD-10-CM

## 2011-07-11 LAB — CBC WITH DIFFERENTIAL/PLATELET
Eosinophils Absolute: 0 10*3/uL (ref 0.0–0.5)
HCT: 34.5 % — ABNORMAL LOW (ref 34.8–46.6)
HGB: 11.9 g/dL (ref 11.6–15.9)
LYMPH%: 35.4 % (ref 14.0–49.7)
MONO#: 0.5 10*3/uL (ref 0.1–0.9)
NEUT#: 2 10*3/uL (ref 1.5–6.5)
NEUT%: 50.6 % (ref 38.4–76.8)
Platelets: 380 10*3/uL (ref 145–400)
WBC: 4 10*3/uL (ref 3.9–10.3)

## 2011-07-11 LAB — MORPHOLOGY: PLT EST: ADEQUATE

## 2011-07-15 ENCOUNTER — Ambulatory Visit (INDEPENDENT_AMBULATORY_CARE_PROVIDER_SITE_OTHER): Payer: Medicare Other | Admitting: Family Medicine

## 2011-07-15 ENCOUNTER — Encounter: Payer: Self-pay | Admitting: Family Medicine

## 2011-07-15 VITALS — BP 142/74 | HR 71 | Ht 64.0 in | Wt 133.0 lb

## 2011-07-15 DIAGNOSIS — N898 Other specified noninflammatory disorders of vagina: Secondary | ICD-10-CM

## 2011-07-15 DIAGNOSIS — N899 Noninflammatory disorder of vagina, unspecified: Secondary | ICD-10-CM

## 2011-07-15 DIAGNOSIS — I10 Essential (primary) hypertension: Secondary | ICD-10-CM

## 2011-07-15 DIAGNOSIS — K219 Gastro-esophageal reflux disease without esophagitis: Secondary | ICD-10-CM

## 2011-07-15 DIAGNOSIS — G2581 Restless legs syndrome: Secondary | ICD-10-CM

## 2011-07-15 MED ORDER — ROPINIROLE HCL 0.25 MG PO TABS: 1.0000 mg | ORAL_TABLET | Freq: Every day | ORAL | Status: DC

## 2011-07-15 NOTE — Patient Instructions (Addendum)
It was a pleasure to see you.    I sent a new prescription for the Requip 0.25mg  tablets; you may titrate up on the dose by 1 tablet at a time (every 3 to 5 days), to a maximum of 4 tablets a day.  If you are not needing to titrate up, then you may stay at 1 tablet/day.   If your heartburn symptoms come back despite your dietary modification, then I ask that you please call me to discuss whether we feel a referral to Gastroenterology is needed.  Finish the remaining few days of omeprazole.   Follow up in the next 6 months.

## 2011-07-16 ENCOUNTER — Telehealth: Payer: Self-pay | Admitting: *Deleted

## 2011-07-16 NOTE — Assessment & Plan Note (Signed)
Well controlled on thiazide diuretic. No changes at this point.

## 2011-07-16 NOTE — Telephone Encounter (Signed)
Pt notified of lab results & to stay on same dose of hydrea=1000mg  daily.

## 2011-07-16 NOTE — Telephone Encounter (Signed)
Message copied by Sabino Snipes on Wed Jul 16, 2011 10:42 AM ------      Message from: Levert Feinstein      Created: Fri Jul 11, 2011  8:02 PM       Call pt - stay on current dose of Hydrea

## 2011-07-16 NOTE — Assessment & Plan Note (Signed)
Re-emphasized instructions for future titration up of the Requip, should she need it.  She does not feel she does at this point. Will continue with 1 tablet/night, she knows to give 3-5 days between increases before we can declare need for further increase in dose.

## 2011-07-16 NOTE — Progress Notes (Signed)
  Subjective:    Patient ID: Jill Myers, female    DOB: [birth year], 78 y.o.   MRN: [id number]  HPI Seen today in follow up for a few issues.  She has been taking the omeprazole for 4-5 weeks, noticed resolution of the GERD symptoms except when she eats chocolate chip cookies and Nutty Buddy bars.  Has 6 days left of omeprazole and unsure whether to continue. Drinks decaf coffee.  Has not had EGD that she is aware of; has had multiple colonoscopies done, with findings of diverticulosis in the past.  Occasional mild flares of lower abd cramping, none now. No blood in stool.   Regarding RLS, is taking the Requip 0.25mg  nightly; feels this helps most nights, but occasionally delay in onset of action.  Discussed instructions for titrating up on dose; she feels this would not be necessary at this point.  Not noticing adverse effects.   Social Hx reviewed.  Patient has her [place] granddaughter with her this summer; plans to accompany Granddaughter back to [place] in August and spend a week there.    Review of Systems No chest pain or shortness of breath.      Objective:   Physical Exam Well appearing, no apparent distress HEENT Neck supple. COR Regular S1S2 PULM Clear bilaterally       Assessment & Plan:

## 2011-07-16 NOTE — Assessment & Plan Note (Signed)
Symptoms seem to be resolved except with certain dietary triggers.  No weight loss, no vomiting.  We discussed the idea that if she is able to abstain from triggers and has no more symptoms, then no more workup would be recommended unless changes.  If she continues to have GERD symptoms despite the diet changes, then for consideration of EGD by GI.  She agrees to contact me if sx recur. She is finishing her final 5 days of PPI therapy.

## 2011-07-29 ENCOUNTER — Other Ambulatory Visit: Payer: Self-pay | Admitting: Family Medicine

## 2011-07-29 DIAGNOSIS — D693 Immune thrombocytopenic purpura: Secondary | ICD-10-CM

## 2011-07-29 MED ORDER — SOLIFENACIN SUCCINATE 5 MG PO TABS: 10.0000 mg | ORAL_TABLET | Freq: Every day | ORAL | Status: DC

## 2011-09-04 ENCOUNTER — Other Ambulatory Visit (HOSPITAL_BASED_OUTPATIENT_CLINIC_OR_DEPARTMENT_OTHER): Payer: Medicare Other | Admitting: Lab

## 2011-09-04 ENCOUNTER — Telehealth: Payer: Self-pay | Admitting: *Deleted

## 2011-09-04 DIAGNOSIS — D693 Immune thrombocytopenic purpura: Secondary | ICD-10-CM

## 2011-09-04 DIAGNOSIS — D759 Disease of blood and blood-forming organs, unspecified: Secondary | ICD-10-CM

## 2011-09-04 DIAGNOSIS — D6949 Other primary thrombocytopenia: Secondary | ICD-10-CM

## 2011-09-04 LAB — CBC WITH DIFFERENTIAL/PLATELET
Eosinophils Absolute: 0 10*3/uL (ref 0.0–0.5)
HCT: 34.9 % (ref 34.8–46.6)
LYMPH%: 34.9 % (ref 14.0–49.7)
MONO#: 0.7 10*3/uL (ref 0.1–0.9)
NEUT#: 2.3 10*3/uL (ref 1.5–6.5)
NEUT%: 48.7 % (ref 38.4–76.8)
Platelets: 388 10*3/uL (ref 145–400)
RBC: 3.12 10*6/uL — ABNORMAL LOW (ref 3.70–5.45)
WBC: 4.7 10*3/uL (ref 3.9–10.3)
lymph#: 1.6 10*3/uL (ref 0.9–3.3)
nRBC: 0 % (ref 0–0)

## 2011-09-04 LAB — MORPHOLOGY: PLT EST: ADEQUATE

## 2011-09-04 NOTE — Telephone Encounter (Signed)
Message copied by Sabino Snipes on Thu Sep 04, 2011  5:07 PM ------      Message from: Levert Feinstein      Created: Thu Sep 04, 2011  3:27 PM       Call pt lab stable - same dose Hydrea CBC,diff Q 2 months

## 2011-09-04 NOTE — Telephone Encounter (Signed)
Notified pt to cont. Same dose of hydrea = 1000mg  daily & repeat labs q 2 months.

## 2011-09-05 ENCOUNTER — Telehealth: Payer: Self-pay | Admitting: Oncology

## 2011-09-05 NOTE — Telephone Encounter (Signed)
Called pt and left message regarding appt 09/09/11 and MD later

## 2011-09-09 ENCOUNTER — Other Ambulatory Visit (HOSPITAL_BASED_OUTPATIENT_CLINIC_OR_DEPARTMENT_OTHER): Payer: Medicare Other | Admitting: Lab

## 2011-09-09 DIAGNOSIS — D693 Immune thrombocytopenic purpura: Secondary | ICD-10-CM

## 2011-09-09 DIAGNOSIS — D6949 Other primary thrombocytopenia: Secondary | ICD-10-CM

## 2011-09-09 LAB — CBC WITH DIFFERENTIAL/PLATELET
BASO%: 0.3 % (ref 0.0–2.0)
Basophils Absolute: 0 10*3/uL (ref 0.0–0.1)
EOS%: 0.9 % (ref 0.0–7.0)
HGB: 11.5 g/dL — ABNORMAL LOW (ref 11.6–15.9)
MCH: 40.6 pg — ABNORMAL HIGH (ref 25.1–34.0)
MCV: 115.3 fL — ABNORMAL HIGH (ref 79.5–101.0)
MONO%: 14.7 % — ABNORMAL HIGH (ref 0.0–14.0)
RBC: 2.84 10*6/uL — ABNORMAL LOW (ref 3.70–5.45)
RDW: 13.3 % (ref 11.2–14.5)
lymph#: 1.5 10*3/uL (ref 0.9–3.3)

## 2011-09-09 LAB — MORPHOLOGY: PLT EST: ADEQUATE

## 2011-09-10 ENCOUNTER — Telehealth: Payer: Self-pay | Admitting: *Deleted

## 2011-09-10 NOTE — Telephone Encounter (Signed)
Called patient to stay on the same dose of Hydrea.  PAtient verbalized understanding.

## 2011-09-10 NOTE — Telephone Encounter (Signed)
Message copied by Sherre Poot on Wed Sep 10, 2011  4:51 PM ------      Message from: Sabino Snipes      Created: Wed Sep 10, 2011  4:49 PM                   ----- Message -----         From: Levert Feinstein, MD         Sent: 09/09/2011   4:28 PM           To: Orbie Hurst, RN, Sabino Snipes, RN, #            Call pt: stay on current dose of Hydrea

## 2011-09-16 ENCOUNTER — Ambulatory Visit: Payer: Medicare Other | Admitting: Oncology

## 2011-09-16 ENCOUNTER — Telehealth: Payer: Self-pay | Admitting: Oncology

## 2011-09-16 ENCOUNTER — Ambulatory Visit (HOSPITAL_BASED_OUTPATIENT_CLINIC_OR_DEPARTMENT_OTHER): Payer: Medicare Other | Admitting: Nurse Practitioner

## 2011-09-16 VITALS — BP 157/89 | HR 82 | Temp 97.8°F | Resp 18 | Ht 64.0 in | Wt 135.2 lb

## 2011-09-16 DIAGNOSIS — M810 Age-related osteoporosis without current pathological fracture: Secondary | ICD-10-CM

## 2011-09-16 DIAGNOSIS — D473 Essential (hemorrhagic) thrombocythemia: Secondary | ICD-10-CM

## 2011-09-16 NOTE — Telephone Encounter (Signed)
Made and printed apt for pt..     sed

## 2011-09-16 NOTE — Progress Notes (Signed)
OFFICE PROGRESS NOTE  Interval history:  Jill Myers is a 78 year old woman with essential thrombocythemia initially diagnosed April 2005. She is maintained on Hydrea and low-dose aspirin. The current Hydrea dose is 1000 mg daily. Seen today for scheduled followup.  She feels well. No interim illnesses or infections. No leg swelling or calf pain. No shortness of breath or chest pain. She denies nausea/vomiting. No mouth sores. No skin rash.   Objective: Blood pressure 157/89, pulse 82, temperature 97.8 F (36.6 C), temperature source Oral, resp. rate 18, height 5\' 4"  (1.626 m), weight 135 lb 3.2 oz (61.326 kg).  Oropharynx is without thrush or ulceration. No palpable cervical, supraclavicular or axillary lymph nodes. Lungs are clear. No wheezes or rales. Regular cardiac rhythm. Abdomen is soft and nontender. No organomegaly. Extremities are without edema. Calves are soft and nontender.  Lab Results: Lab Results  Component Value Date   WBC 4.3 09/09/2011   HGB 11.5* 09/09/2011   HCT 32.8* 09/09/2011   MCV 115.3* 09/09/2011   PLT 373 09/09/2011    Chemistry:    Chemistry      Component Value Date/Time   NA 137 05/15/2011 1326   K 3.9 05/15/2011 1326   CL 101 05/15/2011 1326   CO2 26 05/15/2011 1326   BUN 20 05/15/2011 1326   CREATININE 0.93 05/15/2011 1326   CREATININE 0.77 08/16/2010 0935      Component Value Date/Time   CALCIUM 9.1 05/15/2011 1326   ALKPHOS 41 05/15/2011 1326   AST 17 05/15/2011 1326   ALT 20 05/15/2011 1326   BILITOT 0.4 05/15/2011 1326       Studies/Results: No results found.  Medications: I have reviewed the patient's current medications.  Assessment/Plan:  1. Essential thrombocythemia maintained on Hydrea and low-dose aspirin. 2. Ligamentous laxity of the pelvis. 3. Hypertension. 4. Osteoporosis. 5. Diverticulosis.  Disposition-Jill Myers appears stable. The platelet count is well controlled on Hydrea 1000 mg daily. She will continue the same and return for labs on a 2  month schedule. She will return for a followup visit in 6 months. She will contact the office the interim with any problems.  Jill Myers ANP/GNP-BC    CC: Dr. Paula Compton

## 2011-11-04 ENCOUNTER — Other Ambulatory Visit: Payer: Medicare Other | Admitting: Lab

## 2011-11-05 ENCOUNTER — Ambulatory Visit (HOSPITAL_BASED_OUTPATIENT_CLINIC_OR_DEPARTMENT_OTHER): Payer: Medicare Other | Admitting: Lab

## 2011-11-05 DIAGNOSIS — D693 Immune thrombocytopenic purpura: Secondary | ICD-10-CM

## 2011-11-05 DIAGNOSIS — D6949 Other primary thrombocytopenia: Secondary | ICD-10-CM

## 2011-11-05 LAB — CBC WITH DIFFERENTIAL/PLATELET
BASO%: 0.3 % (ref 0.0–2.0)
EOS%: 0.5 % (ref 0.0–7.0)
MCH: 40.1 pg — ABNORMAL HIGH (ref 25.1–34.0)
MCHC: 34.7 g/dL (ref 31.5–36.0)
MONO#: 0.5 10*3/uL (ref 0.1–0.9)
RBC: 3.05 10*6/uL — ABNORMAL LOW (ref 3.70–5.45)
RDW: 13.2 % (ref 11.2–14.5)
WBC: 4.1 10*3/uL (ref 3.9–10.3)
lymph#: 1.3 10*3/uL (ref 0.9–3.3)

## 2011-11-05 LAB — MORPHOLOGY: PLT EST: ADEQUATE

## 2011-11-07 ENCOUNTER — Telehealth: Payer: Self-pay | Admitting: *Deleted

## 2011-11-07 NOTE — Telephone Encounter (Signed)
Message copied by Sabino Snipes on Fri Nov 07, 2011  4:08 PM ------      Message from: Levert Feinstein      Created: Wed Nov 05, 2011  4:15 PM       Call pt stay on same dose Hydrea

## 2011-11-07 NOTE — Telephone Encounter (Signed)
Notified pt that platelets OK & to cont same hydrea dose which is 1000 mg daily

## 2011-11-11 ENCOUNTER — Ambulatory Visit: Payer: Medicare Other | Admitting: Oncology

## 2011-11-30 ENCOUNTER — Other Ambulatory Visit: Payer: Self-pay | Admitting: Family Medicine

## 2011-12-19 ENCOUNTER — Ambulatory Visit (INDEPENDENT_AMBULATORY_CARE_PROVIDER_SITE_OTHER): Payer: Medicare Other | Admitting: Family Medicine

## 2011-12-19 ENCOUNTER — Encounter: Payer: Self-pay | Admitting: Family Medicine

## 2011-12-19 VITALS — BP 147/82 | HR 81 | Temp 97.7°F | Ht 64.0 in | Wt 136.0 lb

## 2011-12-19 DIAGNOSIS — R0789 Other chest pain: Secondary | ICD-10-CM

## 2011-12-19 DIAGNOSIS — R071 Chest pain on breathing: Secondary | ICD-10-CM

## 2011-12-19 DIAGNOSIS — G2581 Restless legs syndrome: Secondary | ICD-10-CM

## 2011-12-19 DIAGNOSIS — I10 Essential (primary) hypertension: Secondary | ICD-10-CM

## 2011-12-19 HISTORY — DX: Other chest pain: R07.89

## 2011-12-19 MED ORDER — CLONAZEPAM 0.5 MG PO TABS: 0.5000 mg | ORAL_TABLET | Freq: Every evening | ORAL | Status: DC | PRN

## 2011-12-19 MED ORDER — HYDROCHLOROTHIAZIDE 25 MG PO TABS: 25.0000 mg | ORAL_TABLET | Freq: Every day | ORAL | Status: DC

## 2011-12-19 MED ORDER — GABAPENTIN 300 MG PO CAPS: 300.0000 mg | ORAL_CAPSULE | Freq: Three times a day (TID) | ORAL | Status: DC

## 2011-12-19 NOTE — Progress Notes (Signed)
  Subjective:    Patient ID: Jill Myers, female    DOB: 26-Dec-1930, 78 y.o.   MRN: [id number]  HPI Here for follow up of a few issues:  1. BP well controlled. Still on HCTZ.  No issues.   2. RLS has gotten worse.  Escalated up on Requip to 1mg  nightly (4 tabs of 0.25mg ), which had helped but now is not holding all day.  Gets sx in the afternoon.  Feels sleepy during the day.  Had stopped the clonazepam which she used to take before the Requip, because it has stopped being effective.  Gets cramps in the legs and they are only relieved by walking.   3. Discomfort along the lateral aspect of L breast and chest wall. No skin changes, started in July 2013.  Better when she holds her hand against it. No trauma.  Comes and goes, may be absent for several days.  Not associated with cough or sneeze.  No fevers or chills. No regular cough or sputum production.    Had a mammogram and was negative. No nipple discharge.    Review of Systems  See HPI.  GERD symptoms resolved with removal of caffeine from diet; not using the PPI anymore (may use an occasional TUMS if brief food-associated mild reflux).      Objective:   Physical Exam Well appearing, no apparent distress HEENT Neck supple.  No cervical adenopathy. No axillary adenopathy.  COR Regular S1S2 PULM Clear bilaterally, no rales or wheezes CHEST WALL/BREAST: no masses or skin changes over lateral L chest wall, no scarring.  No nipple discharge. No breast masses.  No step-off or other anatomic abnormality.  Nontender to palpation.  ABD Soft, nontender, nondistended. No organomegalies or masses.  EXTS no LE edema; no calf tenderness or erythema, no disparate calf girth.  Negative Homans.  Palpable dp pulses bilat. Yellowed and gnawed-appearing toenails on both feet.  No breaks in skin or ulceration/lesions.        Assessment & Plan:

## 2011-12-19 NOTE — Assessment & Plan Note (Signed)
Well controlled in this 78 yo patient on HCTZ>  No changes at this time.  

## 2011-12-19 NOTE — Patient Instructions (Addendum)
It was a pleasure to see you today.  Your blood pressure is well controlled today at 147/82.  For your restless leg syndrome, continue the Requip at 1mg  nightly.  I am prescribing GABAPENTIN (Neurontin) 300mg  capsules; start by taking 1 capsule at bedtime for 3-5 days, then increase to 1 capsule morning/night, for 3-5 days, and finally 1 capsule three times daily.  You may stop at a lower dose if you notice this is helping at lower doses.    The gabapentin may also be helpful for "neuropathic pain", if this is the cause of your left flank/chest wall pain.   You may take the clonazepam (with care) if you are in need of immediate relief of your RLS.  I would like to touch base after Jan 1 to see how you are doing, even if just by phone.

## 2011-12-19 NOTE — Assessment & Plan Note (Signed)
Initial improvement and then worsening while on Requip.  Discussed the idea of adjunct therapy with gabapentin, which we will scale up slowly to max dose 900/day (divided tid).   She is concerned about the days she has afternoon sx and needs more immediate relief; she may use clonazepam on rare occasions, being careful to monitor for oversedation.  She knows not to take these if she will be out of the house or needing to leave the house. To call if not getting sufficient relief with this combination of meds.  As a side note, the L sided chest wall pain follows a dermatomal distribution and raises the possibility of thoracic radiculopathic pain, which would be expected to improve with gabapentin as well. We discussed this at length and she will call to make me aware if this does not get better. Rib belt/girdle in the meantime; may consider rib films or CXR if not improving.

## 2011-12-23 ENCOUNTER — Other Ambulatory Visit (HOSPITAL_BASED_OUTPATIENT_CLINIC_OR_DEPARTMENT_OTHER): Payer: Medicare Other | Admitting: Lab

## 2011-12-23 DIAGNOSIS — D473 Essential (hemorrhagic) thrombocythemia: Secondary | ICD-10-CM

## 2011-12-23 LAB — CBC WITH DIFFERENTIAL/PLATELET
Basophils Absolute: 0 10*3/uL (ref 0.0–0.1)
Eosinophils Absolute: 0.1 10*3/uL (ref 0.0–0.5)
HCT: 34.4 % — ABNORMAL LOW (ref 34.8–46.6)
LYMPH%: 31.6 % (ref 14.0–49.7)
MCV: 115.6 fL — ABNORMAL HIGH (ref 79.5–101.0)
MONO#: 0.5 10*3/uL (ref 0.1–0.9)
MONO%: 12.6 % (ref 0.0–14.0)
NEUT#: 2.1 10*3/uL (ref 1.5–6.5)
NEUT%: 54.1 % (ref 38.4–76.8)
Platelets: 387 10*3/uL (ref 145–400)
WBC: 3.8 10*3/uL — ABNORMAL LOW (ref 3.9–10.3)

## 2011-12-24 ENCOUNTER — Telehealth: Payer: Self-pay | Admitting: *Deleted

## 2011-12-24 NOTE — Telephone Encounter (Signed)
Message copied by Orbie Hurst on Wed Dec 24, 2011  4:13 PM ------      Message from: Levert Feinstein      Created: Wed Dec 24, 2011 11:25 AM       Call pt - stay on same dose Hydrea continue Q 2 month CBC

## 2011-12-24 NOTE — Telephone Encounter (Signed)
Spoke with patient.  Let her know to stay on same dose of hydrea which is 1000mg  daily.  She is aware that she has an appt. To check her blood in 2 months.  She appreciated the call.

## 2012-01-02 ENCOUNTER — Telehealth: Payer: Self-pay | Admitting: Oncology
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
## 2012-02-13 ENCOUNTER — Other Ambulatory Visit (HOSPITAL_BASED_OUTPATIENT_CLINIC_OR_DEPARTMENT_OTHER): Payer: Medicare PPO

## 2012-02-13 DIAGNOSIS — D473 Essential (hemorrhagic) thrombocythemia: Secondary | ICD-10-CM

## 2012-02-13 LAB — CBC WITH DIFFERENTIAL/PLATELET
BASO%: 0.3 % (ref 0.0–2.0)
HCT: 36.3 % (ref 34.8–46.6)
LYMPH%: 38.7 % (ref 14.0–49.7)
MCH: 37.9 pg — ABNORMAL HIGH (ref 25.1–34.0)
MCHC: 33.1 g/dL (ref 31.5–36.0)
MCV: 114.5 fL — ABNORMAL HIGH (ref 79.5–101.0)
MONO#: 0.3 10*3/uL (ref 0.1–0.9)
MONO%: 9 % (ref 0.0–14.0)
NEUT%: 50.4 % (ref 38.4–76.8)
Platelets: 428 10*3/uL — ABNORMAL HIGH (ref 145–400)

## 2012-02-13 LAB — COMPREHENSIVE METABOLIC PANEL (CC13)
ALT: 24 U/L (ref 0–55)
Alkaline Phosphatase: 49 U/L (ref 40–150)
CO2: 28 mEq/L (ref 22–29)
Creatinine: 0.8 mg/dL (ref 0.6–1.1)
Total Bilirubin: 0.37 mg/dL (ref 0.20–1.20)

## 2012-02-20 ENCOUNTER — Telehealth: Payer: Self-pay | Admitting: *Deleted

## 2012-02-20 ENCOUNTER — Encounter: Payer: Self-pay | Admitting: Oncology

## 2012-02-20 ENCOUNTER — Ambulatory Visit: Payer: Medicare Other | Admitting: Oncology
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
## 2012-02-24 ENCOUNTER — Other Ambulatory Visit: Payer: Medicare Other | Admitting: Lab

## 2012-02-24 ENCOUNTER — Ambulatory Visit: Payer: Medicare Other | Admitting: Oncology

## 2012-02-25 ENCOUNTER — Other Ambulatory Visit: Payer: Self-pay | Admitting: *Deleted

## 2012-02-25 ENCOUNTER — Other Ambulatory Visit: Payer: Self-pay | Admitting: Oncology

## 2012-02-25 DIAGNOSIS — D759 Disease of blood and blood-forming organs, unspecified: Secondary | ICD-10-CM

## 2012-02-26 ENCOUNTER — Telehealth: Payer: Self-pay | Admitting: Oncology

## 2012-02-26 NOTE — Telephone Encounter (Signed)
Called pt and left message regarding 3/11 appt lab and MD

## 2012-03-04 ENCOUNTER — Other Ambulatory Visit: Payer: Self-pay

## 2012-03-15 ENCOUNTER — Encounter: Payer: Self-pay | Admitting: Home Health Services

## 2012-03-16 ENCOUNTER — Ambulatory Visit (HOSPITAL_BASED_OUTPATIENT_CLINIC_OR_DEPARTMENT_OTHER): Payer: Medicare PPO | Admitting: Oncology

## 2012-03-16 ENCOUNTER — Telehealth: Payer: Self-pay | Admitting: Oncology

## 2012-03-16 ENCOUNTER — Other Ambulatory Visit (HOSPITAL_BASED_OUTPATIENT_CLINIC_OR_DEPARTMENT_OTHER): Payer: Medicare PPO | Admitting: Lab

## 2012-03-16 VITALS — BP 139/71 | HR 78 | Temp 98.7°F | Resp 18 | Ht 64.0 in | Wt 138.8 lb

## 2012-03-16 DIAGNOSIS — D473 Essential (hemorrhagic) thrombocythemia: Secondary | ICD-10-CM

## 2012-03-16 LAB — CBC WITH DIFFERENTIAL/PLATELET
Eosinophils Absolute: 0.1 10*3/uL (ref 0.0–0.5)
HCT: 34.7 % — ABNORMAL LOW (ref 34.8–46.6)
LYMPH%: 32.4 % (ref 14.0–49.7)
MCHC: 33.7 g/dL (ref 31.5–36.0)
MONO#: 0.8 10*3/uL (ref 0.1–0.9)
NEUT#: 2.4 10*3/uL (ref 1.5–6.5)
NEUT%: 50.1 % (ref 38.4–76.8)
Platelets: 383 10*3/uL (ref 145–400)
WBC: 4.8 10*3/uL (ref 3.9–10.3)

## 2012-03-16 LAB — MORPHOLOGY: PLT EST: ADEQUATE

## 2012-03-16 NOTE — Telephone Encounter (Signed)
Gave pt appt for labs every 2 months and ML for September 2014

## 2012-03-16 NOTE — Progress Notes (Signed)
Hematology and Oncology Follow Up Visit  Jill Myers [id number] 21-Dec-1930 79 y.o. 03/16/2012 6:54 PM   Principle Diagnosis: Encounter Diagnoses  Name Primary?  . Essential hemorrhagic thrombocythemia   . THROMBOCYTOSIS, PRIMARY Yes     Interim History:   Followup visit for this 79 year old woman with essential thrombocythemia diagnosed in April 2005. She has never had any thrombotic or neurologic events. Disease has been controlled with oral Hydrea and low-dose aspirin.. Current dose of Hydrea 1000 mg daily. Platelet counts have been running in the 400-450,000 range.  Hemoglobin has been stable over 12 g. she has developed macrocytosis of the red cells on chronic Hydrea.   She is doing well at this time. She has had no interim medical problems. She denies any headache or change in vision. No dyspnea no chest pain, no abdominal pain or change in bowel habit, no bone pain, no focal weakness or slurred speech.   Medications: reviewed  Allergies:  Allergies  Allergen Reactions  . Sulfamethoxazole W-Trimethoprim     REACTION: Rash after completing course of Septra    Review of Systems: Constitutional:   No constitutional symptoms Respiratory: No cough or dyspnea Cardiovascular:  See above Gastrointestinal: See above Genito-Urinary: Not questioned Musculoskeletal: No muscle or bone pain Neurologic: See above Skin: No rash or ecchymosis Remaining ROS negative.  Physical Exam: Blood pressure 139/71, pulse 78, temperature 98.7 F (37.1 C), temperature source Oral, resp. rate 18, height 5\' 4"  (1.626 m), weight 138 lb 12.8 oz (62.959 kg). Wt Readings from Last 3 Encounters:  03/16/12 138 lb 12.8 oz (62.959 kg)  12/19/11 136 lb (61.689 kg)  09/16/11 135 lb 3.2 oz (61.326 kg)     General appearance: [name] HENNT: Pharynx no erythema or exudate Lymph nodes: No adenopathy Breasts: Lungs: Clear to auscultation resonant to percussion Heart: Regular rhythm no  murmur Abdomen: Soft, nontender, no mass, no organomegaly Extremities: No edema, no calf tenderness Vascular: Carotids 2+, no bruits Neurologic: Mental status intact, PERRLA, optic discs sharp vessels normal, motor strength 5 over 5, reflexes 1+ symmetric Skin: No rash or ecchymosis  Lab Results: Lab Results: MCV 114   Component Value Date   WBC 4.8 03/16/2012   HGB 11.7 03/16/2012   HCT 34.7* 03/16/2012   MCV 113.9* 03/16/2012   PLT 383 03/16/2012  White count differential: 50% neutrophils, 32% lymphocytes, 16% monocytes   Chemistry      Component Value Date/Time   NA 139 02/13/2012 1344   NA 137 05/15/2011 1326   K 4.1 02/13/2012 1344   K 3.9 05/15/2011 1326   CL 100 02/13/2012 1344   CL 101 05/15/2011 1326   CO2 28 02/13/2012 1344   CO2 26 05/15/2011 1326   BUN 20.1 02/13/2012 1344   BUN 20 05/15/2011 1326   CREATININE 0.8 02/13/2012 1344   CREATININE 0.93 05/15/2011 1326   CREATININE 0.77 08/16/2010 0935      Component Value Date/Time   CALCIUM 9.1 02/13/2012 1344   CALCIUM 9.1 05/15/2011 1326   ALKPHOS 49 02/13/2012 1344   ALKPHOS 41 05/15/2011 1326   AST 18 02/13/2012 1344   AST 17 05/15/2011 1326   ALT 24 02/13/2012 1344   ALT 20 05/15/2011 1326   BILITOT 0.37 02/13/2012 1344   BILITOT 0.4 05/15/2011 1326       Impression and Plan:  #1. Essential thrombocythemia controlled on Hydrea and low dose aspirin plan continue the same.  We are checking lab every 2 months.  #2. Ligamentous  laxity of the pelvis  #3. Essential hypertension  #4. Osteoporosis.  #5. Diverticulosis     CC:. Dr. Paula Compton       Levert Feinstein, MD 3/11/20146:54 PM

## 2012-03-31 ENCOUNTER — Other Ambulatory Visit: Payer: Self-pay | Admitting: *Deleted

## 2012-03-31 DIAGNOSIS — D693 Immune thrombocytopenic purpura: Secondary | ICD-10-CM

## 2012-03-31 MED ORDER — HYDROXYUREA 500 MG PO CAPS: 1000.0000 mg | ORAL_CAPSULE | Freq: Every day | ORAL | Status: DC

## 2012-04-16 ENCOUNTER — Other Ambulatory Visit: Payer: Medicare PPO | Admitting: Lab

## 2012-05-19 ENCOUNTER — Other Ambulatory Visit (HOSPITAL_BASED_OUTPATIENT_CLINIC_OR_DEPARTMENT_OTHER): Payer: Medicare PPO | Admitting: Lab

## 2012-05-19 DIAGNOSIS — D759 Disease of blood and blood-forming organs, unspecified: Secondary | ICD-10-CM

## 2012-05-19 DIAGNOSIS — D473 Essential (hemorrhagic) thrombocythemia: Secondary | ICD-10-CM

## 2012-05-19 LAB — CBC WITH DIFFERENTIAL/PLATELET
BASO%: 0.4 % (ref 0.0–2.0)
Basophils Absolute: 0 10*3/uL (ref 0.0–0.1)
EOS%: 1.2 % (ref 0.0–7.0)
Eosinophils Absolute: 0.1 10*3/uL (ref 0.0–0.5)
HCT: 33.9 % — ABNORMAL LOW (ref 34.8–46.6)
HGB: 11.7 g/dL (ref 11.6–15.9)
LYMPH%: 32.6 % (ref 14.0–49.7)
MCH: 39.2 pg — ABNORMAL HIGH (ref 25.1–34.0)
MCHC: 34.6 g/dL (ref 31.5–36.0)
MCV: 113.2 fL — ABNORMAL HIGH (ref 79.5–101.0)
MONO#: 0.6 10*3/uL (ref 0.1–0.9)
MONO%: 14 % (ref 0.0–14.0)
NEUT#: 2.3 10*3/uL (ref 1.5–6.5)
NEUT%: 51.8 % (ref 38.4–76.8)
Platelets: 395 10*3/uL (ref 145–400)
RBC: 3 10*6/uL — ABNORMAL LOW (ref 3.70–5.45)
RDW: 13.3 % (ref 11.2–14.5)
WBC: 4.4 10*3/uL (ref 3.9–10.3)
lymph#: 1.4 10*3/uL (ref 0.9–3.3)

## 2012-05-19 LAB — MORPHOLOGY: PLT EST: ADEQUATE

## 2012-05-20 ENCOUNTER — Telehealth: Payer: Self-pay | Admitting: *Deleted

## 2012-05-20 NOTE — Telephone Encounter (Signed)
Message copied by Orbie Hurst on Thu May 20, 2012 12:44 PM ------      Message from: Levert Feinstein      Created: Wed May 19, 2012  6:14 PM       Call counts stable - continue current dose Hydrea ------

## 2012-05-20 NOTE — Telephone Encounter (Signed)
Spoke with patient.  Let her know that counts are stable and to continue on her current dose of Hydrea.  She appreciated the phone call.

## 2012-07-07 ENCOUNTER — Telehealth: Payer: Self-pay | Admitting: Family Medicine

## 2012-07-07 DIAGNOSIS — D693 Immune thrombocytopenic purpura: Secondary | ICD-10-CM

## 2012-07-07 NOTE — Telephone Encounter (Signed)
Patient states she called in to her pharmacy on last Friday for the refill on [facility]. Pharmacy states that they have not heard anything from [place]. Patient is completely out of meds.

## 2012-07-07 NOTE — Telephone Encounter (Signed)
Patient states to send RX to [facility] [address] .

## 2012-07-07 NOTE — Telephone Encounter (Signed)
Called patient to schedule an appointment because she has not been seen in over 6 months, she says Dr Mauricio Po only wanted her to follow up by phone call and not an office visit which was in the last office note. Will forward to Dr Mauricio Po for refill request.Busick, [name]

## 2012-07-08 MED ORDER — SOLIFENACIN SUCCINATE 5 MG PO TABS: 10.0000 mg | ORAL_TABLET | Freq: Every day | ORAL | Status: DC

## 2012-07-08 NOTE — Telephone Encounter (Signed)
I am away from the office, hence delay in responding.  I sent the refill to [facility].  JB

## 2012-07-08 NOTE — Telephone Encounter (Signed)
Patient calls again this morning wanting to know the status of her refill request.

## 2012-07-12 NOTE — Telephone Encounter (Signed)
Left message to return call. Please tell Ms Batty her Rx was sent to [facility] this morning.Myrna Vonseggern, [name]

## 2012-07-14 ENCOUNTER — Other Ambulatory Visit (HOSPITAL_BASED_OUTPATIENT_CLINIC_OR_DEPARTMENT_OTHER): Payer: Medicare PPO | Admitting: Lab

## 2012-07-14 DIAGNOSIS — D759 Disease of blood and blood-forming organs, unspecified: Secondary | ICD-10-CM

## 2012-07-14 DIAGNOSIS — D473 Essential (hemorrhagic) thrombocythemia: Secondary | ICD-10-CM

## 2012-07-14 LAB — CBC WITH DIFFERENTIAL/PLATELET
BASO%: 0.3 % (ref 0.0–2.0)
Eosinophils Absolute: 0 10*3/uL (ref 0.0–0.5)
HCT: 34.9 % (ref 34.8–46.6)
LYMPH%: 36 % (ref 14.0–49.7)
MCHC: 34.7 g/dL (ref 31.5–36.0)
MCV: 114.3 fL — ABNORMAL HIGH (ref 79.5–101.0)
MONO#: 0.6 10*3/uL (ref 0.1–0.9)
MONO%: 15.6 % — ABNORMAL HIGH (ref 0.0–14.0)
NEUT%: 47.1 % (ref 38.4–76.8)
Platelets: 430 10*3/uL — ABNORMAL HIGH (ref 145–400)
WBC: 3.9 10*3/uL (ref 3.9–10.3)

## 2012-07-14 LAB — MORPHOLOGY: PLT EST: INCREASED

## 2012-07-22 ENCOUNTER — Telehealth: Payer: Self-pay | Admitting: *Deleted

## 2012-07-22 NOTE — Telephone Encounter (Signed)
Notified pt of plt results & informed to cont same hydrea dose =1000 mg daily.  She reports that she has moved to [facility] independent living & new address is [address], [address], [place]. Will notifiy front desk of change.

## 2012-07-22 NOTE — Telephone Encounter (Signed)
Message copied by Sabino Snipes on Thu Jul 22, 2012  2:39 PM ------      Message from: Levert Feinstein      Created: Fri Jul 16, 2012  2:37 PM       Call pt stay on same dose Hydrea ------

## 2012-07-23 ENCOUNTER — Encounter: Payer: Self-pay | Admitting: Oncology

## 2012-07-23 NOTE — Progress Notes (Signed)
Myrtle called (nurse) and left mess on answering machine and wanted the patient address changed. And that I went in and put the new address in system and that she didn't know the correct zip code for the patient.

## 2012-08-12 ENCOUNTER — Other Ambulatory Visit: Payer: Self-pay | Admitting: Family Medicine

## 2012-08-17 ENCOUNTER — Ambulatory Visit (INDEPENDENT_AMBULATORY_CARE_PROVIDER_SITE_OTHER): Payer: Medicare PPO | Admitting: Family Medicine

## 2012-08-17 ENCOUNTER — Encounter: Payer: Self-pay | Admitting: Family Medicine

## 2012-08-17 VITALS — BP 126/73 | HR 71 | Ht 64.0 in | Wt 135.0 lb

## 2012-08-17 DIAGNOSIS — R1310 Dysphagia, unspecified: Secondary | ICD-10-CM

## 2012-08-17 HISTORY — DX: Dysphagia, unspecified: R13.10

## 2012-08-17 NOTE — Patient Instructions (Addendum)
It was a pleasure to see you today.  After evaluating you today, I do not find evidence of another underlying disorder or explanation for the single episode of choking that you had on Sunday, August 10th.  Specifically, I do not find evidence of stroke or mini-stroke that can cause swallow difficulties in some people.  Nonetheless, I would like you to have an evaluation by the Speech-Language Pathologist to determine if there is any impairment of your swallowing, particularly with thin liquids. I am submitting a request for referral to the SLP at [facility] for your evaluation.   Please be in touch with me if you have any further episodes of this.  I agree with your thoughts that this may be related to talking and eating at the same time.  Generally, softer and thicker foods are easier to process and swallow than either thin liquids (like water) or very dense, solid foods that require a lot of chewing (like beef tips).

## 2012-08-18 NOTE — Progress Notes (Signed)
  Subjective:    Patient ID: Jill Myers, female    DOB: 03-Mar-1930, 79 y.o.   MRN: [id number]  HPI Patient seen today for followup after a choking episode on Sunday Aug 10th at dinner in the dining hall at [facility] where she lives.  She was talking with her dinner companions and eating beef tips, began choking, as though the food would not go down ("like a lump in my throat").  Was unable to breathe.  A friend tried the Heimlich maneuver without much force, patient felt sensation like heartburn.  After a brief while she vomiting forcefully and was able to breathe; vomitus was very little food and mostly mucus.  EMT was called and checked her out, she was feeling better and declined ED evaluation.  She does not recall having dysarthria or HA, no trouble articulating and no focal motor weakness of any extremities around the time of this event.  No one at her table remarked about facial asymmetry. After the event she had cough and scratchy throat for that evening; cough improved over the following day, was using cough drops and Chloraseptic for throat.  She has not had fevers or chills.  Has been able to eat normally, although she admits to feeling scared to eat for fear of another choking event.  This was the first (only) such event she has experienced.   She has been on omeprazole in the past for heartburn, but is not taking it now (is not having heartburn).   Review of Systems No chest pain, no cough now, no shortness of breath, no HA, no focal motor weakness.  Has a good appetite.     Objective:   Physical Exam Well appearing, no apparent distress HEENT Neck supple. EOMI PERRL.  TMs clear bilaterally. Clear oropharynx.  No cervical adenopathy. Facial symmetry with smile; closes eyes tightly symmetrically.  0 COR Regular S1S2,  PULM Clear bilaterally, no rales or wheezes. NEURO Gait unremarkable without assistance.  Handgrip full and symmetric.  UE strength full and symmetric.  Shoulder shrug  symmetric and full.  Rest of CN exam in HEENT section.       Assessment & Plan:

## 2012-08-18 NOTE — Assessment & Plan Note (Signed)
Single episode of choking/aspiration; preceded by trouble swallowing saliva (thin liquids).  There is no evidence to support aspiration, nor TIA/CVA at this time.  She is feeling back to her baseline.  For SLP evaluation. Aug 17, 2012.

## 2012-09-02 ENCOUNTER — Other Ambulatory Visit: Payer: Self-pay | Admitting: Family Medicine

## 2012-09-10 ENCOUNTER — Telehealth: Payer: Self-pay | Admitting: *Deleted

## 2012-09-10 ENCOUNTER — Other Ambulatory Visit (HOSPITAL_BASED_OUTPATIENT_CLINIC_OR_DEPARTMENT_OTHER): Payer: Medicare PPO | Admitting: Lab

## 2012-09-10 DIAGNOSIS — D759 Disease of blood and blood-forming organs, unspecified: Secondary | ICD-10-CM

## 2012-09-10 DIAGNOSIS — D473 Essential (hemorrhagic) thrombocythemia: Secondary | ICD-10-CM

## 2012-09-10 LAB — CBC WITH DIFFERENTIAL/PLATELET
Basophils Absolute: 0 10*3/uL (ref 0.0–0.1)
Eosinophils Absolute: 0.1 10*3/uL (ref 0.0–0.5)
HGB: 11.8 g/dL (ref 11.6–15.9)
LYMPH%: 33.5 % (ref 14.0–49.7)
MONO#: 0.7 10*3/uL (ref 0.1–0.9)
NEUT#: 1.9 10*3/uL (ref 1.5–6.5)
Platelets: 380 10*3/uL (ref 145–400)
RBC: 2.98 10*6/uL — ABNORMAL LOW (ref 3.70–5.45)
WBC: 4 10*3/uL (ref 3.9–10.3)

## 2012-09-10 LAB — COMPREHENSIVE METABOLIC PANEL (CC13)
AST: 18 U/L (ref 5–34)
Alkaline Phosphatase: 52 U/L (ref 40–150)
BUN: 17.2 mg/dL (ref 7.0–26.0)
Creatinine: 0.7 mg/dL (ref 0.6–1.1)
Potassium: 3.6 mEq/L (ref 3.5–5.1)

## 2012-09-10 LAB — MORPHOLOGY: PLT EST: ADEQUATE

## 2012-09-10 NOTE — Telephone Encounter (Signed)
Message copied by Orbie Hurst on Fri Sep 10, 2012  5:04 PM ------      Message from: Levert Feinstein      Created: Fri Sep 10, 2012  4:41 PM       Call lab stable continue current dose of Hydrea ------

## 2012-09-10 NOTE — Telephone Encounter (Signed)
Spoke with patient.  Let her know that her lab is stable and she should continue on her current dose of hydrea.  She appreciated the phone call.

## 2012-09-14 ENCOUNTER — Ambulatory Visit (HOSPITAL_BASED_OUTPATIENT_CLINIC_OR_DEPARTMENT_OTHER): Payer: Medicare PPO | Admitting: Nurse Practitioner

## 2012-09-14 ENCOUNTER — Telehealth: Payer: Self-pay | Admitting: Oncology

## 2012-09-14 VITALS — BP 140/74 | HR 75 | Temp 97.4°F | Resp 18 | Ht 64.0 in | Wt 136.5 lb

## 2012-09-14 DIAGNOSIS — I10 Essential (primary) hypertension: Secondary | ICD-10-CM

## 2012-09-14 DIAGNOSIS — M810 Age-related osteoporosis without current pathological fracture: Secondary | ICD-10-CM

## 2012-09-14 DIAGNOSIS — D473 Essential (hemorrhagic) thrombocythemia: Secondary | ICD-10-CM

## 2012-09-14 NOTE — Telephone Encounter (Signed)
gv pt appt schedule for November 2014 thru March 2015.  °

## 2012-09-14 NOTE — Progress Notes (Signed)
OFFICE PROGRESS NOTE  Interval history:  Jill Myers is a 79 year old woman with essential thrombocythemia initially diagnosed April 2005. She is maintained on Hydrea 1000 mg daily and low-dose aspirin.  She reports she is doing well. No nausea or vomiting. No mouth sores. No skin rash. She denies bleeding. She has a good appetite. No shortness of breath or chest pain. No leg swelling or calf pain. No focal weakness.   Objective: Blood pressure 140/74, pulse 75, temperature 97.4 F (36.3 C), temperature source Oral, resp. rate 18, height 5\' 4"  (1.626 m), weight 136 lb 8 oz (61.916 kg).  Oropharynx is without thrush or ulceration. No palpable cervical, supraclavicular or axillary lymph nodes. Lungs are clear. No wheezes or rales. Regular cardiac rhythm. Abdomen soft and nontender. No organomegaly. Extremities are without edema. Calves soft and nontender. Motor strength 5 over 5. Knee DTRs 1+, symmetric.  Lab Results: Lab Results  Component Value Date   WBC 4.0 09/10/2012   HGB 11.8 09/10/2012   HCT 34.0* 09/10/2012   MCV 114.0* 09/10/2012   PLT 380 09/10/2012    Chemistry:    Chemistry      Component Value Date/Time   NA 137 09/10/2012 1515   NA 137 05/15/2011 1326   K 3.6 09/10/2012 1515   K 3.9 05/15/2011 1326   CL 100 02/13/2012 1344   CL 101 05/15/2011 1326   CO2 27 09/10/2012 1515   CO2 26 05/15/2011 1326   BUN 17.2 09/10/2012 1515   BUN 20 05/15/2011 1326   CREATININE 0.7 09/10/2012 1515   CREATININE 0.93 05/15/2011 1326   CREATININE 0.77 08/16/2010 0935      Component Value Date/Time   CALCIUM 9.2 09/10/2012 1515   CALCIUM 9.1 05/15/2011 1326   ALKPHOS 52 09/10/2012 1515   ALKPHOS 41 05/15/2011 1326   AST 18 09/10/2012 1515   AST 17 05/15/2011 1326   ALT 20 09/10/2012 1515   ALT 20 05/15/2011 1326   BILITOT 0.32 09/10/2012 1515   BILITOT 0.4 05/15/2011 1326       Studies/Results: No results found.  Medications: I have reviewed the patient's current medications.  Assessment/Plan:  1. Essential  thrombocythemia maintained on Hydrea and low-dose aspirin. 2. Ligamentous laxity of the pelvis. 3. Hypertension. 4. Osteoporosis. 5. Diverticulosis.  Disposition-she appears stable. The platelet count is in goal range. She will continue Hydrea and aspirin at the current doses. We are checking labs on a 2 month schedule. She will return for an office visit in 6 months. She will contact the office in the interim with any problems.  [name] ANP/GNP-BC

## 2012-09-20 ENCOUNTER — Other Ambulatory Visit: Payer: Self-pay | Admitting: Family Medicine

## 2012-10-20 ENCOUNTER — Other Ambulatory Visit: Payer: Self-pay | Admitting: Family Medicine

## 2012-11-09 ENCOUNTER — Other Ambulatory Visit (HOSPITAL_BASED_OUTPATIENT_CLINIC_OR_DEPARTMENT_OTHER): Payer: Medicare PPO

## 2012-11-09 DIAGNOSIS — D759 Disease of blood and blood-forming organs, unspecified: Secondary | ICD-10-CM

## 2012-11-09 DIAGNOSIS — D473 Essential (hemorrhagic) thrombocythemia: Secondary | ICD-10-CM

## 2012-11-09 LAB — CBC WITH DIFFERENTIAL/PLATELET
BASO%: 0.5 % (ref 0.0–2.0)
EOS%: 0.9 % (ref 0.0–7.0)
LYMPH%: 33.3 % (ref 14.0–49.7)
MCH: 39.2 pg — ABNORMAL HIGH (ref 25.1–34.0)
MCHC: 34.2 g/dL (ref 31.5–36.0)
MONO#: 0.6 10*3/uL (ref 0.1–0.9)
Platelets: 419 10*3/uL — ABNORMAL HIGH (ref 145–400)
RBC: 3.14 10*6/uL — ABNORMAL LOW (ref 3.70–5.45)
WBC: 4.1 10*3/uL (ref 3.9–10.3)
lymph#: 1.4 10*3/uL (ref 0.9–3.3)

## 2012-11-09 LAB — MORPHOLOGY: PLT EST: INCREASED

## 2012-11-15 ENCOUNTER — Telehealth: Payer: Self-pay | Admitting: *Deleted

## 2012-11-15 NOTE — Telephone Encounter (Signed)
Pt notified of lab results & to stay on same dose of hydrea per Dr. Cyndie Chime.

## 2012-11-15 NOTE — Telephone Encounter (Signed)
Message copied by Sabino Snipes on Mon Nov 15, 2012  2:32 PM ------      Message from: Levert Feinstein      Created: Tue Nov 09, 2012  1:48 PM       Call pt stay on same dose Hydrea ------

## 2012-11-23 ENCOUNTER — Other Ambulatory Visit: Payer: Self-pay | Admitting: Family Medicine

## 2012-12-23 ENCOUNTER — Other Ambulatory Visit: Payer: Self-pay | Admitting: Family Medicine

## 2013-01-04 ENCOUNTER — Other Ambulatory Visit (HOSPITAL_BASED_OUTPATIENT_CLINIC_OR_DEPARTMENT_OTHER): Payer: Medicare PPO

## 2013-01-04 DIAGNOSIS — D473 Essential (hemorrhagic) thrombocythemia: Secondary | ICD-10-CM

## 2013-01-04 LAB — CBC WITH DIFFERENTIAL/PLATELET
Basophils Absolute: 0 10*3/uL (ref 0.0–0.1)
EOS%: 1 % (ref 0.0–7.0)
Eosinophils Absolute: 0 10*3/uL (ref 0.0–0.5)
HGB: 11.9 g/dL (ref 11.6–15.9)
LYMPH%: 30.7 % (ref 14.0–49.7)
MCH: 39.6 pg — ABNORMAL HIGH (ref 25.1–34.0)
MCV: 116.7 fL — ABNORMAL HIGH (ref 79.5–101.0)
MONO%: 12.4 % (ref 0.0–14.0)
NEUT%: 55.6 % (ref 38.4–76.8)
Platelets: 364 10*3/uL (ref 145–400)
RDW: 13.6 % (ref 11.2–14.5)

## 2013-01-04 LAB — MORPHOLOGY: PLT EST: ADEQUATE

## 2013-01-10 ENCOUNTER — Telehealth: Payer: Self-pay | Admitting: *Deleted

## 2013-01-10 NOTE — Telephone Encounter (Signed)
Message copied by Jesse Fall on Mon Jan 10, 2013  2:42 PM ------      Message from: Annia Belt      Created: Tue Jan 04, 2013  7:21 PM       Call pt counts stable - stay on same dose of Hydrea ------

## 2013-01-10 NOTE — Telephone Encounter (Signed)
Reached pt at home # today & informed of stable counts & to continue same dose of hydrea = 1000 mg daily.

## 2013-01-22 ENCOUNTER — Other Ambulatory Visit: Payer: Self-pay | Admitting: Family Medicine

## 2013-02-08 ENCOUNTER — Ambulatory Visit: Payer: Medicare PPO | Admitting: Family Medicine

## 2013-02-15 ENCOUNTER — Ambulatory Visit (INDEPENDENT_AMBULATORY_CARE_PROVIDER_SITE_OTHER): Payer: Medicare PPO | Admitting: Family Medicine

## 2013-02-15 ENCOUNTER — Encounter: Payer: Self-pay | Admitting: Family Medicine

## 2013-02-15 VITALS — BP 128/74 | HR 78 | Ht 64.0 in | Wt 135.0 lb

## 2013-02-15 DIAGNOSIS — E559 Vitamin D deficiency, unspecified: Secondary | ICD-10-CM

## 2013-02-15 DIAGNOSIS — G2581 Restless legs syndrome: Secondary | ICD-10-CM

## 2013-02-15 MED ORDER — LORAZEPAM 0.5 MG PO TABS: 0.5000 mg | ORAL_TABLET | Freq: Three times a day (TID) | ORAL | Status: DC

## 2013-02-15 MED ORDER — ROPINIROLE HCL 0.25 MG PO TABS: ORAL_TABLET | ORAL | Status: DC

## 2013-02-15 NOTE — Patient Instructions (Signed)
It was a pleasure to see you today.  Your blood pressure is 128/74.   For the restless legs syndrome, we will taper off the Requip by 1 tablet every 5 to 7 days (ie, 3 tabs/night, then 2 tabs/night for 5-7 days, then 1 tab/night for 5-7 days, then stop).  Starting lorazepam 0.5mg  one time nightly for the restless legs.  Follow up with me in the coming 3 to 6 weeks to follow up on the control of the restless legs.

## 2013-02-16 ENCOUNTER — Encounter: Payer: Self-pay | Admitting: Family Medicine

## 2013-02-16 LAB — VITAMIN D 25 HYDROXY (VIT D DEFICIENCY, FRACTURES): VIT D 25 HYDROXY: 88 ng/mL (ref 30–89)

## 2013-02-16 NOTE — Progress Notes (Signed)
   Subjective:    Patient ID: Jill Myers, female    DOB: 1930-10-27, 80 y.o.   MRN: [id number]  HPI Patient here for follow up for discussion of RLS.  She has been managing with Requip 0.75/night, taking around 830pm and going to bed around 10pm and falling asleep around 11pm.  Not as effective as previously.  In the past she used to use clonazepam for this, discontinued over a year ago because it was no longer working.  She heard that folic acid can help with RLS and would like to know if she can take folic acid.   Review of previous lab work (CBC, TSH) unremarkable.   Review of Systems No falls, no weakness. The symptoms of discomfort in the legs is better when she walks.       Objective:   Physical Exam Well appearing, no apparent distress.  Gait unremarkable.        Assessment & Plan:

## 2013-02-16 NOTE — Assessment & Plan Note (Signed)
Patient with prior diagnosis of vitamin D deficiency, now taking supplementation.  For recheck of vitamin D level.

## 2013-02-16 NOTE — Assessment & Plan Note (Signed)
Paient with RLS, the Requip is not affording her the same benefit as previously.  Plan to taper down on the Requip and try lorazepam instead, discussed proper use of the lorazepam and importance of close monitoring.  She will call me back in about 1 month to see how she is doing.  Is traveling to [place] to see her infirm sister later this week; plans to institute the taper on the Requip upon her return.  She may take folic acid, although I am unaware of this being used to treat RLS.

## 2013-03-01 ENCOUNTER — Other Ambulatory Visit: Payer: Medicare PPO

## 2013-03-01 ENCOUNTER — Other Ambulatory Visit: Payer: Self-pay | Admitting: Family Medicine

## 2013-03-14 ENCOUNTER — Telehealth: Payer: Self-pay | Admitting: Oncology

## 2013-03-14 ENCOUNTER — Ambulatory Visit (HOSPITAL_BASED_OUTPATIENT_CLINIC_OR_DEPARTMENT_OTHER): Payer: Medicare PPO | Admitting: Oncology

## 2013-03-14 ENCOUNTER — Other Ambulatory Visit (HOSPITAL_BASED_OUTPATIENT_CLINIC_OR_DEPARTMENT_OTHER): Payer: Medicare PPO

## 2013-03-14 ENCOUNTER — Encounter: Payer: Self-pay | Admitting: Oncology

## 2013-03-14 VITALS — BP 137/64 | HR 72 | Temp 98.5°F | Resp 18 | Ht 64.0 in | Wt 137.8 lb

## 2013-03-14 DIAGNOSIS — I10 Essential (primary) hypertension: Secondary | ICD-10-CM

## 2013-03-14 DIAGNOSIS — M810 Age-related osteoporosis without current pathological fracture: Secondary | ICD-10-CM

## 2013-03-14 DIAGNOSIS — D473 Essential (hemorrhagic) thrombocythemia: Secondary | ICD-10-CM

## 2013-03-14 DIAGNOSIS — D693 Immune thrombocytopenic purpura: Secondary | ICD-10-CM

## 2013-03-14 DIAGNOSIS — K5730 Diverticulosis of large intestine without perforation or abscess without bleeding: Secondary | ICD-10-CM

## 2013-03-14 DIAGNOSIS — D759 Disease of blood and blood-forming organs, unspecified: Secondary | ICD-10-CM

## 2013-03-14 LAB — COMPREHENSIVE METABOLIC PANEL (CC13)
ALK PHOS: 45 U/L (ref 40–150)
ALT: 19 U/L (ref 0–55)
AST: 15 U/L (ref 5–34)
Albumin: 3.7 g/dL (ref 3.5–5.0)
Anion Gap: 7 mEq/L (ref 3–11)
BILIRUBIN TOTAL: 0.3 mg/dL (ref 0.20–1.20)
BUN: 15.9 mg/dL (ref 7.0–26.0)
CO2: 30 mEq/L — ABNORMAL HIGH (ref 22–29)
Calcium: 9.2 mg/dL (ref 8.4–10.4)
Chloride: 104 mEq/L (ref 98–109)
Creatinine: 0.7 mg/dL (ref 0.6–1.1)
GLUCOSE: 111 mg/dL (ref 70–140)
POTASSIUM: 3.2 meq/L — AB (ref 3.5–5.1)
Sodium: 141 mEq/L (ref 136–145)
Total Protein: 6.3 g/dL — ABNORMAL LOW (ref 6.4–8.3)

## 2013-03-14 LAB — CBC WITH DIFFERENTIAL/PLATELET
BASO%: 0.5 % (ref 0.0–2.0)
BASOS ABS: 0 10*3/uL (ref 0.0–0.1)
EOS ABS: 0 10*3/uL (ref 0.0–0.5)
EOS%: 1.2 % (ref 0.0–7.0)
HCT: 34.4 % — ABNORMAL LOW (ref 34.8–46.6)
HEMOGLOBIN: 11.6 g/dL (ref 11.6–15.9)
LYMPH#: 1.3 10*3/uL (ref 0.9–3.3)
LYMPH%: 33.6 % (ref 14.0–49.7)
MCH: 39.4 pg — ABNORMAL HIGH (ref 25.1–34.0)
MCHC: 33.8 g/dL (ref 31.5–36.0)
MCV: 116.3 fL — AB (ref 79.5–101.0)
MONO#: 0.5 10*3/uL (ref 0.1–0.9)
MONO%: 13.3 % (ref 0.0–14.0)
NEUT%: 51.4 % (ref 38.4–76.8)
NEUTROS ABS: 2 10*3/uL (ref 1.5–6.5)
Platelets: 456 10*3/uL — ABNORMAL HIGH (ref 145–400)
RBC: 2.96 10*6/uL — AB (ref 3.70–5.45)
RDW: 13.5 % (ref 11.2–14.5)
WBC: 4 10*3/uL (ref 3.9–10.3)

## 2013-03-14 LAB — URIC ACID (CC13): Uric Acid, Serum: 4.1 mg/dl (ref 2.6–7.4)

## 2013-03-14 LAB — LACTATE DEHYDROGENASE (CC13): LDH: 144 U/L (ref 125–245)

## 2013-03-14 LAB — MORPHOLOGY: PLT EST: INCREASED

## 2013-03-14 NOTE — Telephone Encounter (Signed)
gv adn printed aptp sched and avs for pt for May, July and Sept....gv pt [facility] letter....Marland KitchenMarland Kitchen

## 2013-03-16 NOTE — Progress Notes (Signed)
Hematology and Oncology Follow Up Visit  EMERITA Myers [id number] 1930/10/08 80 y.o. 03/16/2013 5:00 PM   Principle Diagnosis: Encounter Diagnoses  Name Primary?  . THROMBOCYTOSIS, PRIMARY Yes  . Essential thrombocytopenia      Interim History:   Followup visit for this 80 year old woman with essential thrombocythemia diagnosed in April 2005. She has never had any thrombotic or neurologic events. Disease has been controlled with oral Hydrea and low-dose aspirin.. Current dose of Hydrea 1000 mg daily. Platelet counts have been running in the 400-450,000 range. Hemoglobin has been stable over 12 g. She has developed macrocytosis of the red cells on chronic Hydrea.  She is doing well at this time. She has had no interim medical problems. She denies any headache or change in vision. No dyspnea no chest pain, no abdominal pain or change in bowel habit, no bone pain, no focal weakness or slurred speech.  She recently moved out of  her house and is now residing [facility]. She remains very active. She is in a book club.   Medications: reviewed  Allergies:  Allergies  Allergen Reactions  . Sulfamethoxazole-Trimethoprim     REACTION: Rash after completing course of Septra    Review of Systems: Hematology:  No bleeding or bruising ENT ROS: No sore throat  Breast ROS: No breast lumps Respiratory ROS: No cough or dyspnea Cardiovascular ROS:  No chest pain or palpitations Gastrointestinal ROS: No abdominal pain or change in bowel habit   Genito-Urinary ROS: No urinary tract symptoms Musculoskeletal ROS: No muscle bone or joint pain Neurological ROS: No headache or change in vision. No slurred speech. No focal weakness. No paresthesias. Dermatological ROS: No rash or ecchymosis Remaining ROS negative:   Physical Exam: Blood pressure 137/64, pulse 72, temperature 98.5 F (36.9 C), temperature source Oral, resp. rate 18, height 5\' 4"  (1.626 m), weight 137 lb 12.8 oz (62.506  kg), SpO2 96.00%. Wt Readings from Last 3 Encounters:  03/14/13 137 lb 12.8 oz (62.506 kg)  02/15/13 135 lb (61.236 kg)  09/14/12 136 lb 8 oz (61.916 kg)     General appearance: [name] HENNT: Pharynx no erythema, exudate, mass, or ulcer. No thyromegaly or thyroid nodules Lymph nodes: No cervical, supraclavicular, or axillary lymphadenopathy Breasts:  Lungs: Clear to auscultation, resonant to percussion throughout Heart: Regular rhythm, no murmur, no gallop, no rub, no click, no edema Abdomen: Soft, nontender, normal bowel sounds, no mass, question spleen tip? Extremities: No edema, no calf tenderness Musculoskeletal: no joint deformities GU:  Vascular: Carotid pulses 2+, no bruits, Neurologic: Alert, oriented, PERRLA, optic discs sharp and vessels normal, no hemorrhage or exudate, cranial nerves grossly normal, motor strength 5 over 5, reflexes 1+ symmetric, upper body coordination normal, gait normal, Skin: No rash or ecchymosis  Lab Results: CBC W/Diff    Component Value Date/Time   WBC 4.0 03/14/2013 1420   WBC 7.1 03/10/2007   RBC 2.96* 03/14/2013 1420   RBC 4.32 03/08/2007 1415   HGB 11.6 03/14/2013 1420   HGB 12.5 12/05/2010 0825   HCT 34.4* 03/14/2013 1420   HCT 44.1 03/08/2007 1415   PLT 456* 03/14/2013 1420   PLT 425 03/10/2007   MCV 116.3* 03/14/2013 1420   MCV 102.3* 03/08/2007 1415   MCH 39.4* 03/14/2013 1420   MCH 36.1* 01/18/2010 1422   MCHC 33.8 03/14/2013 1420   MCHC 34.2 03/08/2007 1415   RDW 13.5 03/14/2013 1420   RDW 15.0 03/08/2007 1415   LYMPHSABS 1.3 03/14/2013 1420  LYMPHSABS 0.8 03/08/2007 1415   MONOABS 0.5 03/14/2013 1420   MONOABS 0.9 03/08/2007 1415   EOSABS 0.0 03/14/2013 1420   EOSABS 0.1 03/08/2007 1415   BASOSABS 0.0 03/14/2013 1420   BASOSABS 0.0 03/08/2007 1415     Chemistry      Component Value Date/Time   NA 141 03/14/2013 1420   NA 137 05/15/2011 1326   K 3.2* 03/14/2013 1420   K 3.9 05/15/2011 1326   CL 100 02/13/2012 1344   CL 101 05/15/2011 1326   CO2 30*  03/14/2013 1420   CO2 26 05/15/2011 1326   BUN 15.9 03/14/2013 1420   BUN 20 05/15/2011 1326   CREATININE 0.7 03/14/2013 1420   CREATININE 0.93 05/15/2011 1326   CREATININE 0.77 08/16/2010 0935      Component Value Date/Time   CALCIUM 9.2 03/14/2013 1420   CALCIUM 9.1 05/15/2011 1326   ALKPHOS 45 03/14/2013 1420   ALKPHOS 41 05/15/2011 1326   AST 15 03/14/2013 1420   AST 17 05/15/2011 1326   ALT 19 03/14/2013 1420   ALT 20 05/15/2011 1326   BILITOT 0.30 03/14/2013 1420   BILITOT 0.4 05/15/2011 1326       Impression:                                                                                                                             Impression and Plan:  #1. Essential thrombocythemia controlled on Hydrea and low dose aspirin plan continue the same.  We are checking lab every 2 months.   #2. Ligamentous laxity of the pelvis   #3. Essential hypertension   #4. Osteoporosis.   #5. Diverticulosis    CC: Patient Care Team: [name], MD as PCP - General Deliah Goody, MD (Ophthalmology) Annia Belt, MD (Hematology and Oncology)   Annia Belt, MD 3/11/20155:00 PM

## 2013-04-01 ENCOUNTER — Other Ambulatory Visit: Payer: Self-pay | Admitting: Family Medicine

## 2013-04-11 ENCOUNTER — Other Ambulatory Visit: Payer: Self-pay | Admitting: Oncology

## 2013-04-13 ENCOUNTER — Telehealth: Payer: Self-pay | Admitting: Family Medicine

## 2013-04-13 NOTE — Telephone Encounter (Addendum)
Please call Jill Myers back regarding her meds.  Pt aware provider is out of office this week.  Will await his call when he return.. Can reach her in the afternoon.

## 2013-04-18 ENCOUNTER — Other Ambulatory Visit: Payer: Self-pay | Admitting: Oncology

## 2013-04-18 MED ORDER — ROPINIROLE HCL 0.25 MG PO TABS
1.0000 mg | ORAL_TABLET | Freq: Every day | ORAL | Status: DC
Start: 2013-04-18 — End: 2014-02-10

## 2013-04-18 MED ORDER — GABAPENTIN 300 MG PO CAPS: ORAL_CAPSULE | ORAL | Status: DC

## 2013-04-18 NOTE — Telephone Encounter (Signed)
Returned patient's call.  She has had some concern about starting lorazepam for RLS, read something about BNZ precipitating Alzheimers.  Has not changed her meds, is still taking gabapentin 300mg  capsules, 1 capsule twice daily and Requip 0.25mg , 4 tablets at bedtime (1mg ).  Plan to increase gabapentin to 300mg  three times daily, and continue Requip for now.  She is to call me back and discuss whether this is working.  Her RLS symptoms are worse in the afternoon if she is reading or attempting to nap (2-3pm), again at bedtime (10pm).  If she is active in the afternoon, she does not have a problem with RLS symptoms.  Also, cystocele continues to be a problem.  She plans to contact a female Gynecologist whom she saw years ago (Dr. Tamala Julian) and re-establish care for this.  She is to call me in about one month to update me on the RLS symptoms.  JB

## 2013-04-20 ENCOUNTER — Other Ambulatory Visit: Payer: Self-pay | Admitting: Oncology

## 2013-04-20 ENCOUNTER — Telehealth: Payer: Self-pay | Admitting: *Deleted

## 2013-04-20 DIAGNOSIS — D693 Immune thrombocytopenic purpura: Secondary | ICD-10-CM

## 2013-04-20 MED ORDER — HYDROXYUREA 500 MG PO CAPS: 1000.0000 mg | ORAL_CAPSULE | Freq: Every day | ORAL | Status: DC

## 2013-04-20 NOTE — Telephone Encounter (Signed)
Message from pt requesting refill on Hydrea. Request forwarded to Dr. Beryle Beams. Pt made aware.

## 2013-05-10 ENCOUNTER — Other Ambulatory Visit (HOSPITAL_BASED_OUTPATIENT_CLINIC_OR_DEPARTMENT_OTHER): Payer: Medicare PPO

## 2013-05-10 DIAGNOSIS — D473 Essential (hemorrhagic) thrombocythemia: Secondary | ICD-10-CM

## 2013-05-10 DIAGNOSIS — D693 Immune thrombocytopenic purpura: Secondary | ICD-10-CM

## 2013-05-10 LAB — CBC WITH DIFFERENTIAL/PLATELET
BASO%: 0.3 % (ref 0.0–2.0)
Basophils Absolute: 0 10*3/uL (ref 0.0–0.1)
EOS%: 1 % (ref 0.0–7.0)
Eosinophils Absolute: 0 10*3/uL (ref 0.0–0.5)
HCT: 35.2 % (ref 34.8–46.6)
HGB: 11.9 g/dL (ref 11.6–15.9)
LYMPH%: 24.9 % (ref 14.0–49.7)
MCH: 39.3 pg — ABNORMAL HIGH (ref 25.1–34.0)
MCHC: 33.8 g/dL (ref 31.5–36.0)
MCV: 116.3 fL — AB (ref 79.5–101.0)
MONO#: 0.5 10*3/uL (ref 0.1–0.9)
MONO%: 14.1 % — AB (ref 0.0–14.0)
NEUT#: 2.2 10*3/uL (ref 1.5–6.5)
NEUT%: 59.7 % (ref 38.4–76.8)
PLATELETS: 380 10*3/uL (ref 145–400)
RBC: 3.03 10*6/uL — AB (ref 3.70–5.45)
RDW: 14 % (ref 11.2–14.5)
WBC: 3.7 10*3/uL — ABNORMAL LOW (ref 3.9–10.3)
lymph#: 0.9 10*3/uL (ref 0.9–3.3)

## 2013-05-10 LAB — MORPHOLOGY: PLT EST: ADEQUATE

## 2013-05-12 ENCOUNTER — Telehealth: Payer: Self-pay | Admitting: *Deleted

## 2013-05-12 ENCOUNTER — Other Ambulatory Visit: Payer: Self-pay | Admitting: *Deleted

## 2013-05-12 NOTE — Telephone Encounter (Signed)
Message copied by Jesse Fall on Thu May 12, 2013  4:12 PM ------      Message from: Annia Belt      Created: Wed May 11, 2013  1:25 PM       Call pt: lab stable; stay on same dose of Hydrea.  I do not see that she has an MD appt: she was supposed to see Dr Alvy Bimler. Please check ------

## 2013-05-12 NOTE — Telephone Encounter (Signed)
Informed pt of lab results per Dr Beryle Beams & to stay on same dose of hydrea = 1000mg /day & should have f/u in Sept with Dr Beryle Beams.  Notified Doris @ [facility] to contact pt for appt.

## 2013-05-28 ENCOUNTER — Other Ambulatory Visit: Payer: Self-pay | Admitting: Family Medicine

## 2013-07-12 ENCOUNTER — Other Ambulatory Visit (HOSPITAL_BASED_OUTPATIENT_CLINIC_OR_DEPARTMENT_OTHER): Payer: Medicare PPO

## 2013-07-12 DIAGNOSIS — D473 Essential (hemorrhagic) thrombocythemia: Secondary | ICD-10-CM

## 2013-07-12 LAB — CBC WITH DIFFERENTIAL/PLATELET
BASO%: 0.4 % (ref 0.0–2.0)
Basophils Absolute: 0 10*3/uL (ref 0.0–0.1)
EOS%: 0.9 % (ref 0.0–7.0)
Eosinophils Absolute: 0 10*3/uL (ref 0.0–0.5)
HEMATOCRIT: 36.6 % (ref 34.8–46.6)
HEMOGLOBIN: 12.5 g/dL (ref 11.6–15.9)
LYMPH#: 1.1 10*3/uL (ref 0.9–3.3)
LYMPH%: 29.9 % (ref 14.0–49.7)
MCH: 39.9 pg — AB (ref 25.1–34.0)
MCHC: 34.1 g/dL (ref 31.5–36.0)
MCV: 117.1 fL — ABNORMAL HIGH (ref 79.5–101.0)
MONO#: 0.6 10*3/uL (ref 0.1–0.9)
MONO%: 15.8 % — ABNORMAL HIGH (ref 0.0–14.0)
NEUT#: 2 10*3/uL (ref 1.5–6.5)
NEUT%: 53 % (ref 38.4–76.8)
PLATELETS: 431 10*3/uL — AB (ref 145–400)
RBC: 3.13 10*6/uL — ABNORMAL LOW (ref 3.70–5.45)
RDW: 13.7 % (ref 11.2–14.5)
WBC: 3.7 10*3/uL — ABNORMAL LOW (ref 3.9–10.3)

## 2013-07-12 LAB — MORPHOLOGY: PLT EST: INCREASED

## 2013-07-14 ENCOUNTER — Telehealth: Payer: Self-pay | Admitting: *Deleted

## 2013-07-14 NOTE — Telephone Encounter (Signed)
Message copied by Ebbie Latus on Thu Jul 14, 2013 11:57 AM ------      Message from: Annia Belt      Created: Tue Jul 12, 2013  2:56 PM       Call pt: blood counts stable continue current dose of hydrea ------

## 2013-07-14 NOTE — Telephone Encounter (Signed)
Pt was called and informed her blood counts are stabled and to continue her current dose of Hydrea per Dr Beryle Beams. No questions.

## 2013-08-20 ENCOUNTER — Other Ambulatory Visit: Payer: Self-pay | Admitting: Family Medicine

## 2013-08-27 ENCOUNTER — Other Ambulatory Visit: Payer: Self-pay | Admitting: Family Medicine

## 2013-09-05 ENCOUNTER — Other Ambulatory Visit: Payer: Self-pay | Admitting: Hematology and Oncology

## 2013-09-05 DIAGNOSIS — D473 Essential (hemorrhagic) thrombocythemia: Secondary | ICD-10-CM

## 2013-09-06 ENCOUNTER — Other Ambulatory Visit (HOSPITAL_BASED_OUTPATIENT_CLINIC_OR_DEPARTMENT_OTHER): Payer: Medicare PPO

## 2013-09-06 DIAGNOSIS — D473 Essential (hemorrhagic) thrombocythemia: Secondary | ICD-10-CM

## 2013-09-06 LAB — COMPREHENSIVE METABOLIC PANEL (CC13)
ALT: 21 U/L (ref 0–55)
AST: 18 U/L (ref 5–34)
Albumin: 3.9 g/dL (ref 3.5–5.0)
Alkaline Phosphatase: 53 U/L (ref 40–150)
Anion Gap: 10 mEq/L (ref 3–11)
BUN: 16.2 mg/dL (ref 7.0–26.0)
CALCIUM: 9.8 mg/dL (ref 8.4–10.4)
CO2: 27 mEq/L (ref 22–29)
CREATININE: 0.9 mg/dL (ref 0.6–1.1)
Chloride: 104 mEq/L (ref 98–109)
Glucose: 109 mg/dl (ref 70–140)
Potassium: 3.5 mEq/L (ref 3.5–5.1)
SODIUM: 140 meq/L (ref 136–145)
TOTAL PROTEIN: 6.8 g/dL (ref 6.4–8.3)
Total Bilirubin: 0.34 mg/dL (ref 0.20–1.20)

## 2013-09-06 LAB — CBC WITH DIFFERENTIAL/PLATELET
BASO%: 0 % (ref 0.0–2.0)
Basophils Absolute: 0 10*3/uL (ref 0.0–0.1)
EOS%: 0.8 % (ref 0.0–7.0)
Eosinophils Absolute: 0 10*3/uL (ref 0.0–0.5)
HCT: 36.4 % (ref 34.8–46.6)
HGB: 12.3 g/dL (ref 11.6–15.9)
LYMPH#: 1.3 10*3/uL (ref 0.9–3.3)
LYMPH%: 35.7 % (ref 14.0–49.7)
MCH: 39.2 pg — ABNORMAL HIGH (ref 25.1–34.0)
MCHC: 33.8 g/dL (ref 31.5–36.0)
MCV: 115.9 fL — ABNORMAL HIGH (ref 79.5–101.0)
MONO#: 0.5 10*3/uL (ref 0.1–0.9)
MONO%: 12.5 % (ref 0.0–14.0)
NEUT#: 1.9 10*3/uL (ref 1.5–6.5)
NEUT%: 51 % (ref 38.4–76.8)
Platelets: 374 10*3/uL (ref 145–400)
RBC: 3.14 10*6/uL — ABNORMAL LOW (ref 3.70–5.45)
RDW: 13.8 % (ref 11.2–14.5)
WBC: 3.7 10*3/uL — AB (ref 3.9–10.3)

## 2013-09-06 LAB — URIC ACID (CC13): Uric Acid, Serum: 5.6 mg/dl (ref 2.6–7.4)

## 2013-09-06 LAB — MORPHOLOGY: PLT EST: ADEQUATE

## 2013-09-06 LAB — LACTATE DEHYDROGENASE (CC13): LDH: 171 U/L (ref 125–245)

## 2013-09-08 ENCOUNTER — Encounter: Payer: Self-pay | Admitting: Family Medicine

## 2013-09-13 ENCOUNTER — Other Ambulatory Visit: Payer: Self-pay | Admitting: Oncology

## 2013-09-13 DIAGNOSIS — D759 Disease of blood and blood-forming organs, unspecified: Secondary | ICD-10-CM

## 2013-09-27 ENCOUNTER — Ambulatory Visit (INDEPENDENT_AMBULATORY_CARE_PROVIDER_SITE_OTHER): Payer: Medicare PPO | Admitting: Oncology

## 2013-09-27 ENCOUNTER — Encounter: Payer: Self-pay | Admitting: Oncology

## 2013-09-27 VITALS — BP 130/66 | HR 80 | Temp 97.7°F | Wt 139.1 lb

## 2013-09-27 DIAGNOSIS — D759 Disease of blood and blood-forming organs, unspecified: Secondary | ICD-10-CM

## 2013-09-27 DIAGNOSIS — D473 Essential (hemorrhagic) thrombocythemia: Secondary | ICD-10-CM

## 2013-09-27 DIAGNOSIS — D6949 Other primary thrombocytopenia: Secondary | ICD-10-CM

## 2013-09-27 DIAGNOSIS — D693 Immune thrombocytopenic purpura: Secondary | ICD-10-CM

## 2013-09-27 MED ORDER — HYDROXYUREA 500 MG PO CAPS: 1000.0000 mg | ORAL_CAPSULE | Freq: Every day | ORAL | Status: DC

## 2013-09-27 NOTE — Progress Notes (Signed)
Patient ID: Jill Myers, female   DOB: [birth year], 80 y.o.   MRN: [id number] Hematology and Oncology Follow Up Visit  Jill Myers [id number] 09/09/1930 80 y.o. 09/27/2013 9:47 AM   Principle Diagnosis: Encounter Diagnosis  Name Primary?  . Essential thrombocytopenia Yes     Interim History:   [name] will be 80 years old next month. She was diagnosed with a myeloproliferative disorder: Essential thrombocythemia, 10 years ago in April 2005. Disease has been well controlled on oral Hydrea and low-dose aspirin. She remains on Hydrea 1000 mg daily and aspirin 81 mg daily. She has never had any thrombotic events. Current blood counts remain at her usual baseline. Red cell indices are macrocytic secondary to the Hydrea. She has a mild, chronic, stable, leukopenia. Hemoglobin has been stable at 12 g. Platelet count currently 374,000 as of 09/06/2013. Chemistry profile the same day shows normal renal and hepatic function. Potassium always runs low normal.  She has had no interim medical problems. She denies any neurologic signs or symptoms and specifically denies headache, change in vision, slurred speech, or focal weakness. She remains very active. She is currently living at the friend's home and is still in an independent living situation.  With respect to health maintenance, her last mammogram was 2 years ago in January 2013. Her primary care physician told her subsequent mammograms at her age would be optional. She has had no change in her bowel habit.  Medications: reviewed  Allergies:  Allergies  Allergen Reactions  . Sulfamethoxazole-Trimethoprim     REACTION: Rash after completing course of Septra    Review of Systems: Hematology:  No bleeding or bruising ENT ROS: No sore throat Breast ROS: See above Respiratory ROS: No cough or dyspnea Cardiovascular ROS:  No chest pain or palpitations Gastrointestinal ROS:  No abdominal pain  Genito-Urinary ROS: Not questioned   Musculoskeletal ROS: No muscle bone or joint pain Neurological ROS: See above Dermatological ROS: No rash Remaining ROS negative:   Physical Exam: Blood pressure 130/66, pulse 80, temperature 97.7 F (36.5 C), temperature source Oral, weight 139 lb 1.6 oz (63.095 kg), SpO2 95.00%. Wt Readings from Last 3 Encounters:  09/27/13 139 lb 1.6 oz (63.095 kg)  03/14/13 137 lb 12.8 oz (62.506 kg)  02/15/13 135 lb (61.236 kg)     General appearance: [name] HENNT: Pharynx no erythema, exudate, mass, or ulcer. No thyromegaly or thyroid nodules Lymph nodes: No cervical, supraclavicular, or axillary lymphadenopathy Breasts:  Lungs: Clear to auscultation, resonant to percussion throughout Heart: Regular rhythm, no murmur, no gallop, no rub, no click, no edema Abdomen: Soft, nontender, normal bowel sounds, no mass, no organomegaly Extremities: No edema, no calf tenderness Musculoskeletal: no joint deformities GU:  Vascular: Carotid pulses 2+, no bruits,  Neurologic: Alert, oriented, PERRLA, optic discs sharp and vessels normal, no hemorrhage or exudate, cranial nerves grossly normal, motor strength 5 over 5, reflexes 1+ symmetric, upper body coordination normal, gait normal, Skin: No rash or ecchymosis  Lab Results: CBC W/Diff    Component Value Date/Time   WBC 3.7* 09/06/2013 1254   WBC 7.1 03/10/2007   RBC 3.14* 09/06/2013 1254   RBC 4.32 03/08/2007 1415   HGB 12.3 09/06/2013 1254   HGB 12.5 12/05/2010 0825   HCT 36.4 09/06/2013 1254   HCT 44.1 03/08/2007 1415   PLT 374 09/06/2013 1254   PLT 425 03/10/2007   MCV 115.9* 09/06/2013 1254   MCV 102.3* 03/08/2007 1415   MCH 39.2* 09/06/2013 1254  MCH 36.1* 01/18/2010 1422   MCHC 33.8 09/06/2013 1254   MCHC 34.2 03/08/2007 1415   RDW 13.8 09/06/2013 1254   RDW 15.0 03/08/2007 1415   LYMPHSABS 1.3 09/06/2013 1254   LYMPHSABS 0.8 03/08/2007 1415   MONOABS 0.5 09/06/2013 1254   MONOABS 0.9 03/08/2007 1415   EOSABS 0.0 09/06/2013 1254   EOSABS 0.1 03/08/2007 1415    BASOSABS 0.0 09/06/2013 1254   BASOSABS 0.0 03/08/2007 1415     Chemistry      Component Value Date/Time   NA 140 09/06/2013 1255   NA 137 05/15/2011 1326   K 3.5 09/06/2013 1255   K 3.9 05/15/2011 1326   CL 100 02/13/2012 1344   CL 101 05/15/2011 1326   CO2 27 09/06/2013 1255   CO2 26 05/15/2011 1326   BUN 16.2 09/06/2013 1255   BUN 20 05/15/2011 1326   CREATININE 0.9 09/06/2013 1255   CREATININE 0.93 05/15/2011 1326   CREATININE 0.77 08/16/2010 0935      Component Value Date/Time   CALCIUM 9.8 09/06/2013 1255   CALCIUM 9.1 05/15/2011 1326   ALKPHOS 53 09/06/2013 1255   ALKPHOS 41 05/15/2011 1326   AST 18 09/06/2013 1255   AST 17 05/15/2011 1326   ALT 21 09/06/2013 1255   ALT 20 05/15/2011 1326   BILITOT 0.34 09/06/2013 1255   BILITOT 0.4 05/15/2011 1326       Impression:  #1. Essential thrombocythemia She remained stable on oral Hydrea and low-dose aspirin. Plan continue both medications at current doses. Continue lab monitoring every 2 months. She prefers to get her labs done at the [facility] cancer center. I will see her again in 6 months for a clinical exam.  #2. Essential hypertension  #3. Osteoporosis  #4. Diverticulosis  #5. Restless leg syndrome.  #5. Ligamentous laxity of the pelvis   CC: Patient Care Team: [name], MD as PCP - General Clent Jacks, MD (Ophthalmology) Annia Belt, MD (Hematology and Oncology)   Annia Belt, MD 9/22/20159:47 AM

## 2013-09-27 NOTE — Patient Instructions (Signed)
Continue Hydrea 500 mg, two capsules daily Continue every 2 month lab checks at the cancer center Return visit with Dr Darnell Level in 6 months

## 2013-10-03 ENCOUNTER — Telehealth: Payer: Self-pay | Admitting: Family Medicine

## 2013-10-03 DIAGNOSIS — N3941 Urge incontinence: Secondary | ICD-10-CM

## 2013-10-03 NOTE — Telephone Encounter (Signed)
Not sure.  Would be helpful if she could bring letter/name of proposed medicine to discuss. JB

## 2013-10-03 NOTE — Telephone Encounter (Signed)
Pt called because she received a letter from her insurance saying that they have a new generic medication for Vesicare. She wanted to know if this is something that would work for her. jw

## 2013-10-05 NOTE — Telephone Encounter (Signed)
Pt called back and said that the name of the generic medication is Oxybutynin ER Capsules. jw

## 2013-10-06 ENCOUNTER — Telehealth: Payer: Self-pay | Admitting: Oncology

## 2013-10-06 MED ORDER — OXYBUTYNIN CHLORIDE ER 5 MG PO TB24: 5.0000 mg | ORAL_TABLET | Freq: Every day | ORAL | Status: DC

## 2013-10-06 NOTE — Telephone Encounter (Signed)
returned pt call re lab appts. per pt she saw JG @ [facility] but does labs here. scheduled pt for q2w month lab per 09/13/13 standing order. per order next due 11/3 and per pt request appt was scheduled 11/2. pt has new d/t and will get new schedule for q45mo lab when she comes in.

## 2013-10-06 NOTE — Telephone Encounter (Signed)
Please let patient know that I changed her Vesicare prescription for Oxybutynin ER 5mg , one tablet by mouth daily at bedtime. Rx sent to her pharmacy on [address].  Thanks,  Dalbert Mayotte, MD

## 2013-10-07 NOTE — Telephone Encounter (Signed)
Pt informed. Danton Palmateer CMA 

## 2013-10-22 ENCOUNTER — Other Ambulatory Visit: Payer: Self-pay | Admitting: Family Medicine

## 2013-11-07 ENCOUNTER — Other Ambulatory Visit (HOSPITAL_BASED_OUTPATIENT_CLINIC_OR_DEPARTMENT_OTHER): Payer: Medicare PPO

## 2013-11-07 DIAGNOSIS — D473 Essential (hemorrhagic) thrombocythemia: Secondary | ICD-10-CM

## 2013-11-07 DIAGNOSIS — D759 Disease of blood and blood-forming organs, unspecified: Secondary | ICD-10-CM

## 2013-11-07 LAB — CBC WITH DIFFERENTIAL/PLATELET
BASO%: 0.3 % (ref 0.0–2.0)
Basophils Absolute: 0 10e3/uL (ref 0.0–0.1)
EOS%: 0.8 % (ref 0.0–7.0)
Eosinophils Absolute: 0 10e3/uL (ref 0.0–0.5)
HCT: 34.6 % — ABNORMAL LOW (ref 34.8–46.6)
HGB: 11.5 g/dL — ABNORMAL LOW (ref 11.6–15.9)
LYMPH%: 39.9 % (ref 14.0–49.7)
MCH: 39.7 pg — ABNORMAL HIGH (ref 25.1–34.0)
MCHC: 33.1 g/dL (ref 31.5–36.0)
MCV: 120 fL — ABNORMAL HIGH (ref 79.5–101.0)
MONO#: 0.4 10e3/uL (ref 0.1–0.9)
MONO%: 12.6 % (ref 0.0–14.0)
NEUT#: 1.5 10e3/uL (ref 1.5–6.5)
NEUT%: 46.4 % (ref 38.4–76.8)
Platelets: 426 10e3/uL — ABNORMAL HIGH (ref 145–400)
RBC: 2.89 10e6/uL — ABNORMAL LOW (ref 3.70–5.45)
RDW: 14.8 % — ABNORMAL HIGH (ref 11.2–14.5)
WBC: 3.3 10e3/uL — ABNORMAL LOW (ref 3.9–10.3)
lymph#: 1.3 10e3/uL (ref 0.9–3.3)

## 2013-11-08 ENCOUNTER — Telehealth: Payer: Self-pay | Admitting: *Deleted

## 2013-11-08 NOTE — Telephone Encounter (Signed)
-----   Message from Annia Belt, MD sent at 11/07/2013  5:48 PM EST ----- Call pt: counts stable - stay on current dose of Hydrea

## 2013-11-08 NOTE — Telephone Encounter (Signed)
Called pt - no answer; left message that counts are stable and to stay on current dose of Hydrea per Dr Beryle Beams. And to call if she has any questions.

## 2013-11-11 ENCOUNTER — Ambulatory Visit (INDEPENDENT_AMBULATORY_CARE_PROVIDER_SITE_OTHER): Payer: Medicare PPO | Admitting: Family Medicine

## 2013-11-11 ENCOUNTER — Encounter: Payer: Self-pay | Admitting: Family Medicine

## 2013-11-11 VITALS — BP 133/70 | HR 68 | Temp 98.3°F | Ht 64.0 in | Wt 135.9 lb

## 2013-11-11 DIAGNOSIS — Z7189 Other specified counseling: Secondary | ICD-10-CM

## 2013-11-11 DIAGNOSIS — Z820 Family history of epilepsy and other diseases of the nervous system: Secondary | ICD-10-CM

## 2013-11-11 DIAGNOSIS — R0789 Other chest pain: Secondary | ICD-10-CM

## 2013-11-11 DIAGNOSIS — G2581 Restless legs syndrome: Secondary | ICD-10-CM

## 2013-11-11 DIAGNOSIS — M79671 Pain in right foot: Secondary | ICD-10-CM

## 2013-11-11 HISTORY — DX: Family history of epilepsy and other diseases of the nervous system: Z82.0

## 2013-11-11 NOTE — Assessment & Plan Note (Signed)
Pain at base of right third toe. No exam findings to explain. Worse when she is on her feet for a long time, as she was during [employer] book fair recently. Plan for xray of foot, may use ibuprofen intermittently for the pain. Physical Therapy at [facility] where she lives.

## 2013-11-11 NOTE — Assessment & Plan Note (Signed)
Patient notes improvement in this pain with calcium carbonate; to continue to use.

## 2013-11-11 NOTE — Patient Instructions (Addendum)
It was a pleasure to see you today.   FOR THE RESTLESS LEGS:   1. We are weaning you off the Requip.  Take 3 tablets/day for the next 5-7 days, then decrease by 1 tab/day every 5 days until you are off.   2. GABAPENTIN 300mg  capsules:  Begin taking 1 capsule AM and lunch, 2 capsules in the evening, for the next 5-7 days.  Then increase to: 1 capsule in AM, 2 capsules at noon and 2 in evening, for another 5-7 days.  If your RLS is not well controlled with this regimen, you may increase by 1/capsule every 5-7 days, to a maximum of 3 capsules three times daily (DAILY DOSE OF 2700MG /Day).  If the gabapentin is not controlling your RLS, we may change/add to your regimen (lorazepam might be our next choice).  For the right foot, I am ordering an x-ray.  Ibuprofen as needed for the pain, on a limited basis.   FOLLOW UP DR Angie Piercey 6 to 8 WEEKS MMSE

## 2013-11-11 NOTE — Assessment & Plan Note (Signed)
From the patient's AVS:   "FOR THE RESTLESS LEGS:   1. We are weaning you off the Requip.  Take 3 tablets/day for the next 5-7 days, then decrease by 1 tab/day every 5 days until you are off.   2. GABAPENTIN 300mg  capsules:  Begin taking 1 capsule AM and lunch, 2 capsules in the evening, for the next 5-7 days.  Then increase to: 1 capsule in AM, 2 capsules at noon and 2 in evening, for another 5-7 days.  If your RLS is not well controlled with this regimen, you may increase by 1/capsule every 5-7 days, to a maximum of 3 capsules three times daily (DAILY DOSE OF 2700MG /Day).  If the gabapentin is not controlling your RLS, we may change/add to your regimen (lorazepam might be our next choice)."

## 2013-11-11 NOTE — Progress Notes (Signed)
   Subjective:    Patient ID: Jill Myers, female    DOB: March 30, 1930, 80 y.o.   MRN: [id number]  HPI Patient here for follow up of several issues:   1. Concern about soreness at the base of the right third/fourth toes.  Notices it more when she is lifting heavy boxes, which she did on Oct 1st. Has had to limit her exercise with the pain. Ibuprofen was helpful for 2 weeks, but then the pain returned when she stopped using it.  No discrete episode of injury.   2. Would like PT at [facility] for her knee and foot problems.  Will prescribe.   3. Restless legs syndrome-- Requip associated with sleepiness. Takes it only at night, has trouble sleeping if she does not take (0.25mg , 4 tablets nightly). Gabapentin 300mg  only twice daily. Would like to review her med regimen and revise.   4. The chest pain that she had been having goes away with TUMS.  She believes it is indigestion.  Wanted to mention this.    5. Traveling to [place] and [place] in February 2016.  Does not need any innoculations to travel to [place].  6. Two of her six brothers have developed Alzheimers, and one of her 4 sisters recently diagnosed with "mild cognitive impairment" by a neurologist. Would like evaluation for this.   7. Would like to complete a MOST form; is interested in comfort-level care, wishes for DNR/DNI, no hospital transport.  Would accept antibiotics if she had an infection and it could extend her life.     Review of Systems No fevers or chills, no chest pain.  No dyspnea or cough.  She does not feel depressed, does not express anhedonia. No abdominal pain.      Objective:   Physical Exam Well appaering, no apparent distress HEENT Neck supple, no cervical adenopathy PULM Clear bialterally COR Regular S1S2, faint 1/6 systolic murmur at aortic area.  ABD Soft, nontender, nondistended.  EXTS: Base of right third toe with mild tenderness to palpation. No maceration of skin between toes. No  ulcerations, no ingrown nails. No point tenderness anywhere else on the foot. Palpable dp pulses, no edema in either foot.  Able to walk across floor independently without assistance.        Assessment & Plan:

## 2013-11-11 NOTE — Assessment & Plan Note (Signed)
MOST form completed today (Nov 11, 2013), in which patient expresses wish for [place] to be comfort-only> Would not want to be taken to hospital. Is clear about her wish to be DNR/DNI.  No pressors or IVF. Would accept antibiotics if might extend her life or aide in her comfort. She is discharged today with the form to be placed in a secure place in her home where EMS responders can find if she is incapacitated.

## 2013-11-21 ENCOUNTER — Telehealth: Payer: Self-pay | Admitting: Family Medicine

## 2013-11-21 NOTE — Telephone Encounter (Signed)
Returning dr Ricki Rodriguez call

## 2013-11-21 NOTE — Telephone Encounter (Signed)
Pt called and would like to speak to Dr. Lindell Noe concerning her leg issues. She has some idea's on what they could be. Please try house and cell phone when calling back. jw

## 2013-11-21 NOTE — Telephone Encounter (Signed)
Tried both house and cell phones; left voice message that I tried to call. JB

## 2013-11-22 NOTE — Telephone Encounter (Signed)
Called patient back; no answer. Left voice message.  JB

## 2013-11-28 ENCOUNTER — Telehealth: Payer: Self-pay | Admitting: Family Medicine

## 2013-11-28 DIAGNOSIS — G5761 Lesion of plantar nerve, right lower limb: Secondary | ICD-10-CM

## 2013-11-28 DIAGNOSIS — M7741 Metatarsalgia, right foot: Secondary | ICD-10-CM

## 2013-11-28 HISTORY — DX: Metatarsalgia, right foot: M77.41

## 2013-11-28 NOTE — Telephone Encounter (Signed)
Called patient at her home number [phone], in response to a note she left this morning about her recent right foot pain, occuring between the third and fourth toes, after extended time working on concrete floor. Is a little better when she stops exercise, takes ibuprofen intermittently. Raises concern for Morton's neuroma.  Referral to [facility] for ultrasound evaluation and consideration of corticosteroid injection.

## 2013-12-14 ENCOUNTER — Other Ambulatory Visit: Payer: Self-pay | Admitting: Family Medicine

## 2013-12-16 ENCOUNTER — Encounter: Payer: Self-pay | Admitting: Sports Medicine

## 2013-12-16 ENCOUNTER — Ambulatory Visit (INDEPENDENT_AMBULATORY_CARE_PROVIDER_SITE_OTHER): Payer: Medicare PPO | Admitting: Sports Medicine

## 2013-12-16 VITALS — BP 113/71 | HR 73 | Ht 64.0 in | Wt 135.0 lb

## 2013-12-16 DIAGNOSIS — M7741 Metatarsalgia, right foot: Secondary | ICD-10-CM

## 2013-12-16 NOTE — Progress Notes (Signed)
Patient ID: Jill Myers, female   DOB: Jun 29, 1930, 80 y.o.   MRN: [id number]  Subjective: Patient is a 80 y/o F who presents with right foot pain and neck pain.  Right foot pain is localized to plantar surface of forefoot near metatarsal heads.  Pain is sharp and worse with prolonged standing/walking especially on hard floor.  Pain has gradually worsened to pain with any weightbearing.  Ibuprofen has helped significantly but she does not want to remain on this long term.  She has no numbness or tingling.  Pain does not radiate into toes.  Pain started approximately 2 months ago.  She has custom orthotics for years.  She tried changing shoes with mild relief.  No change in activity.  Her neck pain is described as spasms that last 10-20 seconds.  She had known DDD in her neck.  Pain is mostly on her left side.  This has been present for several months.  She tried using a neck pillow with mild relief.  She has not tried heat or ice.  She has no numbness or tingling or pain radiating into her arms.  She has no weakness in her hands.  She had previously done neck exercises to help with neck alignment and posture.  Objective: BP 113/71 mmHg  Pulse 73  Ht 5\' 4"  (1.626 m)  Wt 135 lb (61.236 kg)  BMI 23.16 kg/m2 Right Foot: Metatarsal squeeze painful  TTP in 1st/2nd interspace>2nd/3rd>3rd/4th; this is exacerbated by simultaneous metatarsal squeeze  Transverse arch collapse  Loss of forefoot plantar cushion  TTP over 4th metatarsal shaft on plantar surface of foot  No hallux valgus No hammering of toes Normal ROM  Normal anterior drawer; normal lateral ligaments Normal ankle strength to inversion, eversion, dorsiflexion, plantarflexion No loss of longitudinal arch bilateral feet No hindfoot valgus  Neck:   TTP over left>right trapezius and cervical paraspinal  Spasm of left trapezius  Decreased ROM in all directions  Negative  Spruling's  10 degree flexion angle at the neck  Bilateral upper extremity strength and sensation intact and equal bilaterally  Assessment/Plan: Patient is an 80 y/o F with right foot pain and neck pain.  1. Right Foot Pain 2. Right Metatarsalgia with possible early Morton's neuroma 3. Neck Spasm secondary to DDD  Patient was fitted with metatarsal pad which was added to pre-existing custom orthotic.  This was comfortable to her today; if this remains beneficial she will bring in her other pairs of custom orthotics to have this metatarsal pad added.  Hopefully this pad will take some pressure off of her distal metatarsal head and alleviate her metatarsalgia.  Her neck spasms are likely secondary to DDD.  She will continue home exercises to try and help with her flexion angle.  Patient seen and discussed with Dr. Oneida Alar.  Creig Hines PGY-3 Family Medicine  Reviewed and agree    [name], MD

## 2013-12-16 NOTE — Assessment & Plan Note (Signed)
Sxs today most consistent with MTsalgia  May have morton's neuroma but not impressive today  We will pad her orthotics ADD ext small MT pad  See if this is working and continues to lessen her pain

## 2013-12-19 ENCOUNTER — Other Ambulatory Visit: Payer: Self-pay | Admitting: Family Medicine

## 2013-12-19 MED ORDER — GABAPENTIN 300 MG PO CAPS: ORAL_CAPSULE | ORAL | Status: DC

## 2013-12-19 NOTE — Telephone Encounter (Signed)
Needs gabenpentin.  Dr Lindell Noe increased her dosage to 6-9 per day but RX was written for 3 per day

## 2013-12-26 ENCOUNTER — Telehealth: Payer: Self-pay | Admitting: Family Medicine

## 2013-12-26 NOTE — Telephone Encounter (Signed)
Pt is calling because Dr. Lindell Noe increased her Gabapentin to 6 a day. Her current prescription is written 3 a day. The pharmacy said this needs to be changed to reflect the new dosage. Please call and change as soon as we can jw

## 2014-01-02 ENCOUNTER — Other Ambulatory Visit: Payer: Medicare PPO

## 2014-01-02 MED ORDER — GABAPENTIN 300 MG PO CAPS: ORAL_CAPSULE | ORAL | Status: DC

## 2014-01-02 NOTE — Addendum Note (Signed)
Addended by: [name] on: 01/02/2014 02:17 PM   Modules accepted: Orders

## 2014-01-03 ENCOUNTER — Ambulatory Visit (HOSPITAL_BASED_OUTPATIENT_CLINIC_OR_DEPARTMENT_OTHER): Payer: Medicare PPO

## 2014-01-03 DIAGNOSIS — D473 Essential (hemorrhagic) thrombocythemia: Secondary | ICD-10-CM

## 2014-01-03 DIAGNOSIS — D759 Disease of blood and blood-forming organs, unspecified: Secondary | ICD-10-CM

## 2014-01-03 LAB — CBC WITH DIFFERENTIAL/PLATELET
BASO%: 0.5 % (ref 0.0–2.0)
BASOS ABS: 0 10*3/uL (ref 0.0–0.1)
EOS%: 1.2 % (ref 0.0–7.0)
Eosinophils Absolute: 0 10*3/uL (ref 0.0–0.5)
HCT: 36.9 % (ref 34.8–46.6)
HGB: 12.2 g/dL (ref 11.6–15.9)
LYMPH%: 26.9 % (ref 14.0–49.7)
MCH: 39.9 pg — ABNORMAL HIGH (ref 25.1–34.0)
MCHC: 33.1 g/dL (ref 31.5–36.0)
MCV: 120.4 fL — ABNORMAL HIGH (ref 79.5–101.0)
MONO#: 0.4 10*3/uL (ref 0.1–0.9)
MONO%: 13.5 % (ref 0.0–14.0)
NEUT#: 1.9 10*3/uL (ref 1.5–6.5)
NEUT%: 57.9 % (ref 38.4–76.8)
Platelets: 379 10*3/uL (ref 145–400)
RBC: 3.06 10*6/uL — AB (ref 3.70–5.45)
RDW: 13.9 % (ref 11.2–14.5)
WBC: 3.2 10*3/uL — AB (ref 3.9–10.3)
lymph#: 0.9 10*3/uL (ref 0.9–3.3)

## 2014-02-10 ENCOUNTER — Encounter: Payer: Self-pay | Admitting: Family Medicine

## 2014-02-10 ENCOUNTER — Ambulatory Visit (INDEPENDENT_AMBULATORY_CARE_PROVIDER_SITE_OTHER): Payer: Medicare PPO | Admitting: Family Medicine

## 2014-02-10 VITALS — BP 133/73 | HR 67 | Temp 97.8°F | Ht 63.0 in | Wt 135.6 lb

## 2014-02-10 DIAGNOSIS — N3941 Urge incontinence: Secondary | ICD-10-CM

## 2014-02-10 DIAGNOSIS — G4700 Insomnia, unspecified: Secondary | ICD-10-CM

## 2014-02-10 DIAGNOSIS — G2581 Restless legs syndrome: Secondary | ICD-10-CM

## 2014-02-10 HISTORY — DX: Insomnia, unspecified: G47.00

## 2014-02-10 LAB — POCT URINALYSIS DIPSTICK
Bilirubin, UA: NEGATIVE
GLUCOSE UA: NEGATIVE
Ketones, UA: NEGATIVE
NITRITE UA: POSITIVE
PROTEIN UA: NEGATIVE
Spec Grav, UA: 1.02
UROBILINOGEN UA: 0.2
pH, UA: 8

## 2014-02-10 MED ORDER — GABAPENTIN 300 MG PO CAPS: ORAL_CAPSULE | ORAL | Status: DC

## 2014-02-10 MED ORDER — RAMELTEON 8 MG PO TABS: 8.0000 mg | ORAL_TABLET | Freq: Every day | ORAL | Status: DC

## 2014-02-10 NOTE — Progress Notes (Signed)
   Subjective:    Patient ID: Jill Myers, female    DOB: May 07, 1930, 81 y.o.   MRN: [id number]  HPI Patient here for follow up of a few issues:   1. Metatarsalgia much improved since she saw [facility], Dr Oneida Alar, addition of pad to her orthotics. No problems now. She still treats her feet gingerly and avoids overextending her self on walks.   2. Continues with sleep problems. Has taken melatonin supplements with some relief. Is concerned about jet lag and sleeping in same room as her sister when she travels to [place] and [place] later this month.  3. Continues with urinary urgency and occasional incontinence, goes frequently (every 20 min) to void. In the past has been told she had a prolapsed bladder and has palpated it herself in the past. No dysuria, but sensation of bladder pressure at the end of void. Feeling of incomplete emptying. No loss of urine with valsalva.  She does Kegels maneuvers with modest improvement.   4. Recent changes in Gabapentin for RLS, which she says is very well controlled using Gabapentin 300mg , taking 1 in the AM, 2 at lunch and evening.    Review of Systems No fevers/chills, no cough, no chest pain, no constipation, no changes in bowel habits. No blood in urine or per rectum.     Objective:   Physical Exam  Well appearing, no apparent distress HEENT Neck supple, no cervical adenopathy. Moist mucus membranes COR Regular S1S2, no murmurs or extra sounds PULM Clear bilaterally, no rales or wheezes.  ABD Soft, nontender.  GYN: Vaginal atrophy. No mucosal lesions. Bimanual exam with no visible loss of fluid with valsalva. Anterior vaginal wall fullness c/w cystocele.       Assessment & Plan:

## 2014-02-10 NOTE — Assessment & Plan Note (Signed)
Patient with history c/w urge incontinence; also with concern for possible anterior cystocele. She performs Kegel maneuvers regularly (has for years)( and does not feel this is helping.  Oxybutynin helps a little; Vesicare did not help at all in the past. For urology evaluation.  UA today.

## 2014-02-10 NOTE — Patient Instructions (Signed)
It was a pleasure to see you today.   For the sleep issues, we are adding Rozerem 8mg  taken one time nightly an hour before bedtime.  For the urinary issues, we are attempting a referral to evaluate.

## 2014-02-10 NOTE — Assessment & Plan Note (Signed)
Well managed with addition of gabapentin.  She does have some trouble maintaining sleep, and would like to add an additional 300mg  of gabapentin at night. Plan to add Rozerem 8mg  at bedtime as well for sleep.

## 2014-02-10 NOTE — Assessment & Plan Note (Signed)
Some trouble staying asleep; has had some help with melatonin.  Increased bedtime dose gabapentin (for RLS) to help, as well as addition of Rozerem 8mg  at hs. She is traveling to [place] and [place] and bunking with her sister during the two-week trip later this month. May use the Rozerem in advance of her trip to see how she responds.

## 2014-02-12 ENCOUNTER — Other Ambulatory Visit: Payer: Self-pay | Admitting: Family Medicine

## 2014-02-13 ENCOUNTER — Other Ambulatory Visit: Payer: Self-pay | Admitting: Family Medicine

## 2014-02-13 ENCOUNTER — Telehealth: Payer: Self-pay | Admitting: Oncology

## 2014-02-13 ENCOUNTER — Telehealth: Payer: Self-pay | Admitting: Family Medicine

## 2014-02-13 DIAGNOSIS — N3941 Urge incontinence: Secondary | ICD-10-CM

## 2014-02-13 NOTE — Telephone Encounter (Signed)
Called patient's home number (her preferred phone #) to discuss urinalysis. The quantity was insufficient for micro or culture, and the POCT UA showed 3+ leukocyte esterase and positive nitrites.  Left voice message. I plan to request repeat UA with micro, and UCx if positive. JB

## 2014-02-13 NOTE — Telephone Encounter (Signed)
, °

## 2014-02-14 ENCOUNTER — Telehealth: Payer: Self-pay | Admitting: Family Medicine

## 2014-02-14 NOTE — Telephone Encounter (Signed)
Pt is returning Dr. Rosario Jacks call. Please call her concerning earlier message. jw

## 2014-02-15 NOTE — Telephone Encounter (Signed)
Called patient back, she will come in to give a urine sample for repeat UA and microscopy, urine cx if abnormal.  Orders placed previously.    She asks about urology referral.  Will ask Red Team to follow up with patient on this.   Patient says melatonin is working well for her sleep disorder. She did not buy the Rozerem due to cost (would be $500 copay to her out of pocket).  Plan to use lorazepam 0.5mg  at bedtime during upcoming trip to [place] if jet lag.  No new Rx needed for this, patient has from Feb 2015 which she picked up but never used.   JB

## 2014-02-16 ENCOUNTER — Other Ambulatory Visit (INDEPENDENT_AMBULATORY_CARE_PROVIDER_SITE_OTHER): Payer: Medicare PPO

## 2014-02-16 ENCOUNTER — Encounter: Payer: Self-pay | Admitting: *Deleted

## 2014-02-16 DIAGNOSIS — N3941 Urge incontinence: Secondary | ICD-10-CM

## 2014-02-17 LAB — POCT URINALYSIS DIPSTICK
Bilirubin, UA: NEGATIVE
GLUCOSE UA: NEGATIVE
KETONES UA: NEGATIVE
Nitrite, UA: POSITIVE
PROTEIN UA: NEGATIVE
SPEC GRAV UA: 1.015
UROBILINOGEN UA: 0.2
pH, UA: 7

## 2014-02-17 LAB — POCT UA - MICROSCOPIC ONLY

## 2014-02-17 NOTE — Progress Notes (Signed)
Patient was here for lab visit on 02/16/14 and was unable to void, so she was given a urine cup and instructed to bring back a sample this morning.

## 2014-02-20 ENCOUNTER — Telehealth: Payer: Self-pay | Admitting: Family Medicine

## 2014-02-20 DIAGNOSIS — N3090 Cystitis, unspecified without hematuria: Secondary | ICD-10-CM | POA: Insufficient documentation

## 2014-02-20 LAB — URINE CULTURE

## 2014-02-20 MED ORDER — CEPHALEXIN 500 MG PO CAPS: 500.0000 mg | ORAL_CAPSULE | Freq: Two times a day (BID) | ORAL | Status: DC

## 2014-02-20 NOTE — Telephone Encounter (Signed)
Called patient home number and left voice message. She is having incontinence with urine culture growing E coli, pan sensitive. I left message that I am sending in Rx for treatment (Keflex x 5 days).  Left call-back number.  JB

## 2014-02-21 ENCOUNTER — Telehealth: Payer: Self-pay | Admitting: Family Medicine

## 2014-02-21 NOTE — Telephone Encounter (Signed)
I called patient and relayed results of urine culture.  She reports continually feeling urgency.  To treat E coli (pan-sensitive) with Keflex 500 bid for 5 days. She leaves for [place] on 2/22.  [facility] after she returns.  JB

## 2014-02-24 ENCOUNTER — Other Ambulatory Visit (HOSPITAL_BASED_OUTPATIENT_CLINIC_OR_DEPARTMENT_OTHER): Payer: Medicare PPO

## 2014-02-24 DIAGNOSIS — D473 Essential (hemorrhagic) thrombocythemia: Secondary | ICD-10-CM

## 2014-02-24 LAB — CBC WITH DIFFERENTIAL/PLATELET
BASO%: 0.4 % (ref 0.0–2.0)
BASOS ABS: 0 10*3/uL (ref 0.0–0.1)
EOS ABS: 0 10*3/uL (ref 0.0–0.5)
EOS%: 0.9 % (ref 0.0–7.0)
HCT: 34.3 % — ABNORMAL LOW (ref 34.8–46.6)
HGB: 11.3 g/dL — ABNORMAL LOW (ref 11.6–15.9)
LYMPH#: 1.2 10*3/uL (ref 0.9–3.3)
LYMPH%: 32.2 % (ref 14.0–49.7)
MCH: 39.3 pg — ABNORMAL HIGH (ref 25.1–34.0)
MCHC: 33.1 g/dL (ref 31.5–36.0)
MCV: 118.8 fL — ABNORMAL HIGH (ref 79.5–101.0)
MONO#: 0.6 10*3/uL (ref 0.1–0.9)
MONO%: 14.9 % — ABNORMAL HIGH (ref 0.0–14.0)
NEUT%: 51.6 % (ref 38.4–76.8)
NEUTROS ABS: 1.9 10*3/uL (ref 1.5–6.5)
PLATELETS: 301 10*3/uL (ref 145–400)
RBC: 2.89 10*6/uL — AB (ref 3.70–5.45)
RDW: 13.7 % (ref 11.2–14.5)
WBC: 3.7 10*3/uL — AB (ref 3.9–10.3)

## 2014-02-27 ENCOUNTER — Telehealth: Payer: Self-pay | Admitting: *Deleted

## 2014-02-27 ENCOUNTER — Other Ambulatory Visit: Payer: Medicare PPO

## 2014-02-27 NOTE — Telephone Encounter (Signed)
-----   Message from Annia Belt, MD sent at 02/24/2014  1:50 PM EST ----- Call pt: counts stable. Stay on same dose of Hydrea

## 2014-02-27 NOTE — Telephone Encounter (Signed)
Called pt at home and on cell; no answer - left message counts are stable and to stay on same dose of Hydrea per Dr Beryle Beams. And to call if she has any questions.

## 2014-03-15 ENCOUNTER — Encounter: Payer: Self-pay | Admitting: Family Medicine

## 2014-03-15 ENCOUNTER — Ambulatory Visit (INDEPENDENT_AMBULATORY_CARE_PROVIDER_SITE_OTHER): Payer: Medicare PPO | Admitting: Family Medicine

## 2014-03-15 VITALS — BP 127/75 | HR 76 | Temp 98.3°F | Wt 132.9 lb

## 2014-03-15 DIAGNOSIS — B9789 Other viral agents as the cause of diseases classified elsewhere: Principal | ICD-10-CM

## 2014-03-15 DIAGNOSIS — J069 Acute upper respiratory infection, unspecified: Secondary | ICD-10-CM | POA: Diagnosis not present

## 2014-03-15 LAB — POCT RAPID STREP A (OFFICE): RAPID STREP A SCREEN: NEGATIVE

## 2014-03-15 NOTE — Patient Instructions (Signed)
You have a common cold caused by a virus.  The good news is it goes away on its own and the bad news is we don't have much of a treatment to make it go away faster. Antibiotics are useless against viruses.  What to do:  - Drink adequate fluids.  - Use nasal saline spray as needed to help clear runny nose. - Take warm showers and drink hot tea. The humid air will help with runny nose and congestion. - Take 1 tbsp of honey every 2 hours to help with cough and sore throat. - You can use tylenol alternating with motrin every 3 hours for fever with pain.  - All members in the household should wash their hands frequently. Cold-causing viruses can stay on hands for 2 days or more.  - Minimize your exposure to smoke from cigarettes, etc.  Timeline:  - Colds usually persist for 3 to 10 days in the most people but it can take 2-3 weeks for cough to completely go away.  - If you start getting worse after initial improvement or if this is not going away in the next 10 days you should make another appointment. If you have difficulty breathing you should present to the emergency room right away.

## 2014-03-15 NOTE — Progress Notes (Signed)
Subjective: Jill Myers is a previously healthy 81 y.o. female presenting for cold symptoms.  She reports 3 days of gradual onset cough, sore throat and nasal congestion which developed at the same time as her sister with whom she has been traveling. She is here because the nurse at her retirement home suggested she be seen.  - ROS: She denies fevers, chills, chest pain, and difficulty breathing. Objective: Blood pressure 127/75, pulse 76, temperature 98.3 F (36.8 C), temperature source Oral, weight 132 lb 14.4 oz (60.283 kg). GEN: well nourished elderly female HEENT: moist mucous membranes, eyes normal, TMs grey bilaterally without effusion or inflammation, nares patent, oropharynx clear and erythematous NECK: supple, no anterior cervical lymphadenopathy; + tender posterior cervical nodes CHEST: normal air exchange with normal respiratory effort and no retractions; no rales, no rhonchi, no wheezes HEART: regular rate, normal S1/S2, no murmurs Rapid strep: Neg  Assessment & Plan: 81 y.o. female with viral URI with cough. No red flags. Reviewed supportive measures, expected duration, and return precautions.

## 2014-04-10 ENCOUNTER — Ambulatory Visit: Payer: Medicare PPO | Admitting: Oncology

## 2014-04-24 ENCOUNTER — Other Ambulatory Visit (HOSPITAL_BASED_OUTPATIENT_CLINIC_OR_DEPARTMENT_OTHER): Payer: Medicare PPO

## 2014-04-24 DIAGNOSIS — D759 Disease of blood and blood-forming organs, unspecified: Secondary | ICD-10-CM

## 2014-04-24 DIAGNOSIS — D473 Essential (hemorrhagic) thrombocythemia: Secondary | ICD-10-CM | POA: Diagnosis not present

## 2014-04-24 LAB — CBC WITH DIFFERENTIAL/PLATELET
BASO%: 0.5 % (ref 0.0–2.0)
Basophils Absolute: 0 10*3/uL (ref 0.0–0.1)
EOS ABS: 0 10*3/uL (ref 0.0–0.5)
EOS%: 1.3 % (ref 0.0–7.0)
HCT: 34.5 % — ABNORMAL LOW (ref 34.8–46.6)
HGB: 11.6 g/dL (ref 11.6–15.9)
LYMPH#: 1.2 10*3/uL (ref 0.9–3.3)
LYMPH%: 32.1 % (ref 14.0–49.7)
MCH: 39 pg — ABNORMAL HIGH (ref 25.1–34.0)
MCHC: 33.5 g/dL (ref 31.5–36.0)
MCV: 116.5 fL — ABNORMAL HIGH (ref 79.5–101.0)
MONO#: 0.5 10*3/uL (ref 0.1–0.9)
MONO%: 12.6 % (ref 0.0–14.0)
NEUT#: 2 10*3/uL (ref 1.5–6.5)
NEUT%: 53.5 % (ref 38.4–76.8)
Platelets: 436 10*3/uL — ABNORMAL HIGH (ref 145–400)
RBC: 2.96 10*6/uL — AB (ref 3.70–5.45)
RDW: 14.3 % (ref 11.2–14.5)
WBC: 3.8 10*3/uL — ABNORMAL LOW (ref 3.9–10.3)

## 2014-04-25 ENCOUNTER — Telehealth: Payer: Self-pay | Admitting: *Deleted

## 2014-04-25 NOTE — Telephone Encounter (Signed)
Pt called - no answer; left message her labs are stable and to continue current dose of Hydrea per Dr Beryle Beams. And to call if she has any questions.

## 2014-04-25 NOTE — Telephone Encounter (Signed)
-----   Message from Annia Belt, MD sent at 04/24/2014  4:32 PM EDT ----- Call pt: counts stable continue current dose of Hydrea

## 2014-05-08 ENCOUNTER — Ambulatory Visit (INDEPENDENT_AMBULATORY_CARE_PROVIDER_SITE_OTHER): Payer: Medicare PPO | Admitting: Oncology

## 2014-05-08 ENCOUNTER — Other Ambulatory Visit: Payer: Self-pay | Admitting: Oncology

## 2014-05-08 ENCOUNTER — Encounter: Payer: Self-pay | Admitting: Oncology

## 2014-05-08 VITALS — BP 127/62 | HR 75 | Temp 98.1°F | Ht 64.0 in | Wt 136.2 lb

## 2014-05-08 DIAGNOSIS — Z7982 Long term (current) use of aspirin: Secondary | ICD-10-CM | POA: Diagnosis not present

## 2014-05-08 DIAGNOSIS — D473 Essential (hemorrhagic) thrombocythemia: Secondary | ICD-10-CM

## 2014-05-08 DIAGNOSIS — D759 Disease of blood and blood-forming organs, unspecified: Secondary | ICD-10-CM

## 2014-05-08 DIAGNOSIS — D693 Immune thrombocytopenic purpura: Secondary | ICD-10-CM

## 2014-05-08 DIAGNOSIS — M2420 Disorder of ligament, unspecified site: Secondary | ICD-10-CM

## 2014-05-08 DIAGNOSIS — Z79899 Other long term (current) drug therapy: Secondary | ICD-10-CM

## 2014-05-08 DIAGNOSIS — I10 Essential (primary) hypertension: Secondary | ICD-10-CM

## 2014-05-08 DIAGNOSIS — K5790 Diverticulosis of intestine, part unspecified, without perforation or abscess without bleeding: Secondary | ICD-10-CM

## 2014-05-08 DIAGNOSIS — G2581 Restless legs syndrome: Secondary | ICD-10-CM

## 2014-05-08 DIAGNOSIS — M810 Age-related osteoporosis without current pathological fracture: Secondary | ICD-10-CM

## 2014-05-08 NOTE — Progress Notes (Signed)
Patient ID: Jill Myers, female   DOB: 11-02-1930, 81 y.o.   MRN: [id number] Hematology and Oncology Follow Up Visit  Jill Myers [id number] Apr 30, 1930 81 y.o. 05/08/2014 10:13 AM   Principle Diagnosis: Encounter Diagnoses  Name Primary?  . Disease of blood and blood forming organ Yes  . Essential thrombocythemia    clinical summary: Pleasant 81 year old woman diagnosed with essentials thrombocythemia in April 2005. Blood counts are well controlled on low-dose Hydrea 1000 mg daily and aspirin 81 mg daily. She has never had any thrombotic events. Hydrea controls her counts at the expense of a mild chronic leukopenia and anemia.   Interim History:   She continues to do well. No interim medical problems. She denies any headache, change in vision, slurred speech, focal weakness, or dysarthria. No dyspnea. No chest pain. No palpitations.  Medications: reviewed  Allergies:  Allergies  Allergen Reactions  . Sulfamethoxazole-Trimethoprim Rash    REACTION: Rash after completing course of Septra    Review of Systems: See history of present illness Remaining ROS negative:   Physical Exam: Blood pressure 127/62, pulse 75, temperature 98.1 F (36.7 C), temperature source Oral, height 5\' 4"  (1.626 m), weight 136 lb 3.2 oz (61.78 kg), SpO2 94 %. Wt Readings from Last 3 Encounters:  05/08/14 136 lb 3.2 oz (61.78 kg)  03/15/14 132 lb 14.4 oz (60.283 kg)  02/10/14 135 lb 9 oz (61.491 kg)     General appearance: Thin elderly Caucasian woman HENNT: Pharynx no erythema, exudate, mass, or ulcer. No thyromegaly or thyroid nodules Lymph nodes: No cervical, supraclavicular, or axillary lymphadenopathy Breasts:  Lungs: Clear to auscultation, resonant to percussion throughout Heart: Regular rhythm, no murmur, no gallop, no rub, no click, no edema Abdomen: Soft, nontender, normal bowel sounds, no mass, no organomegaly Extremities: No edema, no calf tenderness Musculoskeletal: no joint  deformities GU:  Vascular: Carotid pulses 2+, no bruits, Neurologic: Alert, oriented, PERRLA, optic discs sharp and vessels normal, no hemorrhage or exudate, cranial nerves grossly normal, motor strength 5 over 5, reflexes 1+ symmetric, upper body coordination normal, gait normal, Skin: No rash or ecchymosis  Lab Results: CBC W/Diff  White count differential: 83% neutrophils, 32 lymphocytes, 13 monocytes. No immature cells recorded.   Component Value Date/Time   WBC 3.8* 04/24/2014 1414   WBC 7.1 03/10/2007   RBC 2.96* 04/24/2014 1414   RBC 4.32 03/08/2007 1415   HGB 11.6 04/24/2014 1414   HGB 12.5 12/05/2010 0825   HCT 34.5* 04/24/2014 1414   HCT 44.1 03/08/2007 1415   PLT 436* 04/24/2014 1414   PLT 425 03/10/2007   MCV 116.5* 04/24/2014 1414   MCV 102.3* 03/08/2007 1415   MCH 39.0* 04/24/2014 1414   MCH 36.1* 01/18/2010 1422   MCHC 33.5 04/24/2014 1414   MCHC 34.2 03/08/2007 1415   RDW 14.3 04/24/2014 1414   RDW 15.0 03/08/2007 1415   LYMPHSABS 1.2 04/24/2014 1414   LYMPHSABS 0.8 03/08/2007 1415   MONOABS 0.5 04/24/2014 1414   MONOABS 0.9 03/08/2007 1415   EOSABS 0.0 04/24/2014 1414   EOSABS 0.1 03/08/2007 1415   BASOSABS 0.0 04/24/2014 1414   BASOSABS 0.0 03/08/2007 1415     Chemistry      Component Value Date/Time   NA 140 09/06/2013 1255   NA 137 05/15/2011 1326   K 3.5 09/06/2013 1255   K 3.9 05/15/2011 1326   CL 100 02/13/2012 1344   CL 101 05/15/2011 1326   CO2 27 09/06/2013 1255   CO2 26  05/15/2011 1326   BUN 16.2 09/06/2013 1255   BUN 20 05/15/2011 1326   CREATININE 0.9 09/06/2013 1255   CREATININE 0.93 05/15/2011 1326   CREATININE 0.77 08/16/2010 0935      Component Value Date/Time   CALCIUM 9.8 09/06/2013 1255   CALCIUM 9.1 05/15/2011 1326   ALKPHOS 53 09/06/2013 1255   ALKPHOS 41 05/15/2011 1326   AST 18 09/06/2013 1255   AST 17 05/15/2011 1326   ALT 21 09/06/2013 1255   ALT 20 05/15/2011 1326   BILITOT 0.34 09/06/2013 1255   BILITOT 0.4  05/15/2011 1326       Radiological Studies: No results found.  Impression:  #1. Essential thrombocythemia She remains stable on oral Hydrea and low-dose aspirin. Plan:  continue both medications at current doses. Continue lab monitoring every 2 months. She prefers to get her labs done at the [facility]. I will see her again in 6 months for a clinical exam.  #2. Essential hypertension  #3. Osteoporosis  #4. Diverticulosis  #5. Restless leg syndrome.  #5. Ligamentous laxity of the pelvis   CC: Patient Care Team: Lorna Few, DO as PCP - General (Family Medicine) Clent Jacks, MD (Ophthalmology) Annia Belt, MD (Hematology and Oncology)   Annia Belt, MD 5/2/201610:13 AM

## 2014-05-08 NOTE — Patient Instructions (Signed)
Continue lab every 2 months at [facility] Return visit with Dr Darnell Level in 6 months

## 2014-05-12 ENCOUNTER — Other Ambulatory Visit: Payer: Self-pay | Admitting: Family Medicine

## 2014-05-12 NOTE — Telephone Encounter (Signed)
Tamika I am not sure who her new provider is so I will refill this once Can u call it in [facility]! Dorcas Mcmurray

## 2014-05-12 NOTE — Telephone Encounter (Signed)
Lorazepam (Ativan) 0.5 mg tablet: #30, no refills called into [facility] per verbal order by Dr. Nori Riis.  Derl Barrow, RN

## 2014-06-19 ENCOUNTER — Other Ambulatory Visit: Payer: Medicare PPO

## 2014-06-20 ENCOUNTER — Telehealth: Payer: Self-pay | Admitting: Internal Medicine

## 2014-06-20 NOTE — Telephone Encounter (Signed)
pt cld to r/s appt-gave pt updated time & date °

## 2014-06-21 ENCOUNTER — Other Ambulatory Visit (HOSPITAL_BASED_OUTPATIENT_CLINIC_OR_DEPARTMENT_OTHER): Payer: Medicare PPO

## 2014-06-21 DIAGNOSIS — D693 Immune thrombocytopenic purpura: Secondary | ICD-10-CM | POA: Diagnosis not present

## 2014-06-21 DIAGNOSIS — D473 Essential (hemorrhagic) thrombocythemia: Secondary | ICD-10-CM

## 2014-06-21 DIAGNOSIS — D759 Disease of blood and blood-forming organs, unspecified: Secondary | ICD-10-CM

## 2014-06-21 LAB — CBC WITH DIFFERENTIAL/PLATELET
BASO%: 0.3 % (ref 0.0–2.0)
Basophils Absolute: 0 10*3/uL (ref 0.0–0.1)
EOS%: 1.3 % (ref 0.0–7.0)
Eosinophils Absolute: 0 10*3/uL (ref 0.0–0.5)
HCT: 35.4 % (ref 34.8–46.6)
HGB: 12 g/dL (ref 11.6–15.9)
LYMPH%: 29.7 % (ref 14.0–49.7)
MCH: 39.6 pg — ABNORMAL HIGH (ref 25.1–34.0)
MCHC: 33.9 g/dL (ref 31.5–36.0)
MCV: 116.8 fL — ABNORMAL HIGH (ref 79.5–101.0)
MONO#: 0.4 10*3/uL (ref 0.1–0.9)
MONO%: 12.4 % (ref 0.0–14.0)
NEUT%: 56.3 % (ref 38.4–76.8)
NEUTROS ABS: 1.7 10*3/uL (ref 1.5–6.5)
Platelets: 349 10*3/uL (ref 145–400)
RBC: 3.03 10*6/uL — AB (ref 3.70–5.45)
RDW: 14 % (ref 11.2–14.5)
WBC: 3.1 10*3/uL — AB (ref 3.9–10.3)
lymph#: 0.9 10*3/uL (ref 0.9–3.3)

## 2014-06-22 ENCOUNTER — Telehealth: Payer: Self-pay | Admitting: *Deleted

## 2014-06-22 NOTE — Telephone Encounter (Signed)
Pt called / informed her counts are stable and to stay on current dose of Hydrea per Dr Beryle Beams. Pt voiced understanding.

## 2014-06-22 NOTE — Telephone Encounter (Signed)
-----   Message from Annia Belt, MD sent at 06/21/2014  3:12 PM EDT ----- Call pt: counts stable; stay on current dose of Hydrea

## 2014-08-14 ENCOUNTER — Other Ambulatory Visit (HOSPITAL_BASED_OUTPATIENT_CLINIC_OR_DEPARTMENT_OTHER): Payer: Medicare PPO

## 2014-08-14 DIAGNOSIS — D693 Immune thrombocytopenic purpura: Secondary | ICD-10-CM | POA: Diagnosis not present

## 2014-08-14 DIAGNOSIS — D473 Essential (hemorrhagic) thrombocythemia: Secondary | ICD-10-CM

## 2014-08-14 LAB — CBC WITH DIFFERENTIAL/PLATELET
BASO%: 0.3 % (ref 0.0–2.0)
Basophils Absolute: 0 10*3/uL (ref 0.0–0.1)
EOS%: 0.6 % (ref 0.0–7.0)
Eosinophils Absolute: 0 10*3/uL (ref 0.0–0.5)
HCT: 34 % — ABNORMAL LOW (ref 34.8–46.6)
HEMOGLOBIN: 11.4 g/dL — AB (ref 11.6–15.9)
LYMPH%: 33.2 % (ref 14.0–49.7)
MCH: 39.9 pg — AB (ref 25.1–34.0)
MCHC: 33.7 g/dL (ref 31.5–36.0)
MCV: 118.5 fL — ABNORMAL HIGH (ref 79.5–101.0)
MONO#: 0.5 10*3/uL (ref 0.1–0.9)
MONO%: 14.2 % — AB (ref 0.0–14.0)
NEUT#: 2 10*3/uL (ref 1.5–6.5)
NEUT%: 51.7 % (ref 38.4–76.8)
PLATELETS: 371 10*3/uL (ref 145–400)
RBC: 2.87 10*6/uL — AB (ref 3.70–5.45)
RDW: 14 % (ref 11.2–14.5)
WBC: 3.8 10*3/uL — ABNORMAL LOW (ref 3.9–10.3)
lymph#: 1.3 10*3/uL (ref 0.9–3.3)

## 2014-08-15 ENCOUNTER — Telehealth: Payer: Self-pay | Admitting: *Deleted

## 2014-08-15 NOTE — Telephone Encounter (Signed)
Pt called - no answer; left message her counts are stable and continue current dose of Hydrea per Dr Beryle Beams. And to call if she has any questions.

## 2014-08-15 NOTE — Telephone Encounter (Signed)
-----   Message from Annia Belt, MD sent at 08/14/2014  4:18 PM EDT ----- Call pt: counts stable. Stay on current dose of Hydrea

## 2014-09-06 ENCOUNTER — Other Ambulatory Visit: Payer: Self-pay | Admitting: Family Medicine

## 2014-09-27 ENCOUNTER — Other Ambulatory Visit: Payer: Self-pay | Admitting: Family Medicine

## 2014-10-03 ENCOUNTER — Other Ambulatory Visit: Payer: Self-pay | Admitting: Oncology

## 2014-10-05 ENCOUNTER — Encounter: Payer: Self-pay | Admitting: Internal Medicine

## 2014-10-05 ENCOUNTER — Non-Acute Institutional Stay: Payer: Medicare PPO | Admitting: Internal Medicine

## 2014-10-05 VITALS — BP 150/84 | HR 68 | Temp 98.4°F | Ht 64.0 in | Wt 137.0 lb

## 2014-10-05 DIAGNOSIS — R42 Dizziness and giddiness: Secondary | ICD-10-CM

## 2014-10-05 DIAGNOSIS — D473 Essential (hemorrhagic) thrombocythemia: Secondary | ICD-10-CM

## 2014-10-05 DIAGNOSIS — N3941 Urge incontinence: Secondary | ICD-10-CM | POA: Diagnosis not present

## 2014-10-05 DIAGNOSIS — R1310 Dysphagia, unspecified: Secondary | ICD-10-CM

## 2014-10-05 DIAGNOSIS — I10 Essential (primary) hypertension: Secondary | ICD-10-CM | POA: Diagnosis not present

## 2014-10-05 DIAGNOSIS — M8580 Other specified disorders of bone density and structure, unspecified site: Secondary | ICD-10-CM | POA: Diagnosis not present

## 2014-10-05 DIAGNOSIS — M7741 Metatarsalgia, right foot: Secondary | ICD-10-CM | POA: Diagnosis not present

## 2014-10-05 DIAGNOSIS — G4700 Insomnia, unspecified: Secondary | ICD-10-CM | POA: Diagnosis not present

## 2014-10-05 DIAGNOSIS — G2581 Restless legs syndrome: Secondary | ICD-10-CM | POA: Diagnosis not present

## 2014-10-06 ENCOUNTER — Encounter: Payer: Self-pay | Admitting: Internal Medicine

## 2014-10-06 DIAGNOSIS — I10 Essential (primary) hypertension: Secondary | ICD-10-CM | POA: Insufficient documentation

## 2014-10-06 DIAGNOSIS — M8580 Other specified disorders of bone density and structure, unspecified site: Secondary | ICD-10-CM | POA: Insufficient documentation

## 2014-10-06 MED ORDER — RANITIDINE HCL 150 MG PO TABS: ORAL_TABLET | ORAL | Status: DC

## 2014-10-06 MED ORDER — MELATONIN 5 MG PO TABS: ORAL_TABLET | ORAL | Status: DC

## 2014-10-06 NOTE — Progress Notes (Signed)
Patient ID: Jill Myers, female   DOB: 1930-09-15, 81 y.o.   MRN: [id number]    HISTORY AND PHYSICAL  Location:  [place] of Service: Clinic (12)   Extended Emergency Contact Information Primary Emergency Contact: Jill Myers,Jill Myers Address: [address] [place] [address] [name] of [place] Phone: [phone] Mobile Phone: [phone] Relation: Son  Advanced Directive information Does patient have an advance directive?: Yes, Type of Advance Directive: Healthcare Power of [place];Living will, Does patient want to make changes to advanced directive?: No - Patient declined  Chief Complaint  Patient presents with  . Medical Management of Chronic Issues    New Patient to get est with [facility]. Her PCP moved    HPI:  Patient presents today as a new patient to transfer care to [facility]. Previous physician has moved. Was followed that: Family practice outpatient clinic for many years.  General health has been pretty good, but there are a few things that continue to be a problem.  She has essential thrombocythemia and is under treatment with Hydrea from Dr. Beryle Beams.  Hypertension has been under good control with current medications.  There have been problems with loss of balance" dizziness". Although her son would like her to see a neurologist about this, she is reluctant to commit to this.  Restless leg syndrome has been a problem. She currently is on gabapentin 1 in the morning 2 in the afternoon and 3 at nighttime. She has previously used Requip and clonazepam which seemed to work initially but then she may have acquired a tolerance to them because they stopped working. She does not feel she is getting the same relief from gabapentin that she did in the past. She asked about other alternatives.  She has known osteopenia. She was on Fosamax for at least 4 years.  She has known diverticulosis and a single attack of diverticulitis  in the past. She has no current symptoms.  There is pain in both feet that has been attributed to metatarsalgia and more recently to a Morton's neuroma in the left foot. She has seen Dr. Peterson Ao fields for this problem.  Patient has a history of removal of the right toenail in order to remove a "freckle". Pathology showed this was not a melanoma. Toenail regrew in an irregular fashion and is now split.  Patient has sensations of food sticking in her throat at approximately mid chest. She has frequent heartburn. She has taken Prilosec off and on. Currently she uses Tums.  She has a mild urinary incontinence and a history of cystocele. She would like a referral, but prefers a physician that is skilled in both gynecologic as well as urologic issues. She prefers a female.  Past Medical History  Diagnosis Date  . Diverticulitis   . Myeloma     prolefica  . Restless legs syndrome (RLS)   . Essential thrombocythemia 01/31/11  . Osteopenia   . Bladder cystocele 05/21/2010  . Deglutition disorder 08/17/2012    Single episode of choking/aspiration; preceded by trouble swallowing saliva (thin liquids).  For SLP evaluation. Aug 17, 2012.    [name] Disease of blood and blood forming organ 01/05/2007    Qualifier: Diagnosis of  By: Oneida Alar MD, KARL    . DIVERTICULOSIS OF COLON 03/05/2006    Qualifier: Diagnosis of  By: Samara Snide    . DIZZINESS 01/18/2010    Qualifier: Diagnosis of  By: Lindell Noe MD, Jeneen Rinks    . Essential hypertension, benign 02/22/2008    Qualifier: Diagnosis of  By: Lindell Noe MD, Jeneen Rinks     . Family history of Alzheimer's disease 11/11/2013    Patient with family history of Alzheimers disease. She herself is very highly functional.  Plans for future visit with [facility] as primary function of the visit.    [name] GERD (gastroesophageal reflux disease) 08/16/2010  . HALLUX VALGUS, ACQUIRED 04/10/2009    Qualifier: Diagnosis of  By: [name] CMA,, [place]la    . HYPERTRIGLYCERIDEMIA 01/05/2007    Qualifier: Diagnosis of   By: Oneida Alar MD, KARL    . Insomnia 02/10/2014  . Left-sided chest wall pain 12/19/2011     L sided chest wall pain follows a dermatomal distribution and raises the possibility of thoracic radiculopathic pain, which would be expected to improve with gabapentin as well. We discussed this at length and she will call to make me aware if this does not get better. Rib belt/girdle in the meantime; may consider rib films or CXR if not improving.     . Metatarsalgia of right foot 11/28/2013    Referral to [facility]   . Restless leg syndrome 06/09/2011  . URINARY INCONTINENCE, URGE, MILD 11/27/2009    Qualifier: Diagnosis of  By: Zebedee Iba NP, Manuela Schwartz    . Vitamin D deficiency 08/16/2010    Past Surgical History  Procedure Laterality Date  . Cholecystectomy  1992  . Nailbed repair  12/05/2010    Procedure: NAILBED REPAIR;  Surgeon: Cammie Sickle., MD;  Location: [place];  Service: Orthopedics;  Laterality: Right;  full thickness nail biopsy right hallux  . Cataract extraction  2010    Dr. [name]  . Torn tenden  2012    hand Dr. Orie Rout  . Mohs surgery  2012    on face [facility]  . Biopsy breast Left 1998    Dr. Rebekah Chesterfield  . Colonoscopy  2008    Patient Care Team: Estill Dooms, MD as PCP - General (Internal Medicine) Clent Jacks, MD as Consulting Physician (Ophthalmology) Annia Belt, MD as Consulting Physician (Oncology) [name], MD as Consulting Physician (Dermatology) [name], MD as Consulting Physician ([place]) [name], MD as Consulting Physician (Family Medicine)  Social History   Social History  . Marital Status: Widowed    Spouse Name: N/A  . Number of Children: 2  . Years of Education: bachelors   Occupational History  . [place] Unemployed  . Retired- Paramedic    Social History Main Topics  . Smoking status: Former Smoker    Quit date: 12/02/1958  . Smokeless tobacco: Never Used  . Alcohol  Use: 0.0 oz/week    0 Standard drinks or equivalent per week     Comment: Wine rarely.  . Drug Use: No  . Sexual Activity: Not on file   Other Topics Concern  . Not on file   Social History Narrative   Health Care POA: [name]- Son   Emergency Contact: neighbor, [name] [phone]   End of Life Plan: POA, Living Will   Widow   Lives at [facility]   Any pets: none   Diet: Pt has a varied diet of protein, starch, vegetables   Exercise: Pt exercises 3x week for 1 hour with group.   Seatbelts: Pt reports wearing seatbelt when in vehicles.    Sun Exposure/Protection: pt does not wear sun protection  Hobbies: opera, symphony, reading, walking          reports that she quit smoking about 55 years ago. She has never used smokeless tobacco. She reports that she drinks alcohol. She reports that she does not use illicit drugs.  Family History  Problem Relation Age of Onset  . Heart disease Mother   . Tuberculosis Father   . Heart disease Sister   . Heart disease Brother   . Alzheimer's disease Brother   . Alzheimer's disease Brother    Family Status  Relation Status Death Age  . Mother Deceased 42  . Father Deceased 36  . Daughter Alive   . Son Alive   . Sister Deceased 68  . Brother Deceased 28  . Sister Alive   . Brother Alive   . Brother Alive   . Brother Alive   . Brother Deceased   . Brother Alive   . Sister Alive   . Sister Alive     Immunization History  Administered Date(s) Administered  . Influenza Split 10/11/2010, 09/18/2011, 09/29/2013  . Influenza Whole 11/12/2009  . Influenza-Unspecified 10/04/2014  . Pneumococcal Polysaccharide-23 02/22/2008  . Td 04/10/2009  . Zoster 10/12/2007    Allergies  Allergen Reactions  . Sulfamethoxazole-Trimethoprim Rash    REACTION: Rash after completing course of Septra    Medications: Patient's Medications  New Prescriptions   No medications on file  Previous Medications   ASPIRIN 81  MG TABLET    Take 81 mg by mouth daily.     CHOLECALCIFEROL (VITAMIN D) 1000 UNITS TABLET    Take 1,000 Units by mouth daily.   DOCUSATE SODIUM (COLACE) 250 MG CAPSULE    Take 250 mg by mouth daily.   FISH OIL-OMEGA-3 FATTY ACIDS 1000 MG CAPSULE    Take 1 g by mouth daily.     GABAPENTIN (NEURONTIN) 300 MG CAPSULE    TAKE ONE CAPSULE EACH AM 2 AT NOON AND 3 EACH EVENING   GLUCOSAMINE-CHONDROITIN 750-600 MG TABS    Take 1 tablet by mouth 2 (two) times daily.   HYDROCHLOROTHIAZIDE (HYDRODIURIL) 25 MG TABLET    TAKE 1 TABLET BY MOUTH DAILY   HYDROXYUREA (HYDREA) 500 MG CAPSULE    TAKE 2 CAPSULES (1,000 MG TOTAL) BY MOUTH DAILY. MAY TAKE WITH FOOD TO MINIMIZE GI SIDE EFFECTS.   IBUPROFEN (ADVIL,MOTRIN) 200 MG TABLET    Take 200 mg by mouth as needed.   LORAZEPAM (ATIVAN) 0.5 MG TABLET    May takeone by mouth at bedtime as needed for insomnia   MULTIPLE VITAMIN (MULTIVITAMIN) TABLET    Take 1 tablet by mouth daily.     OXYBUTYNIN (DITROPAN-XL) 5 MG 24 HR TABLET    Take 1 tablet (5 mg total) by mouth at bedtime.   POLYETHYLENE GLYCOL (MIRALAX / GLYCOLAX) PACKET    Take 17 g by mouth daily.    Modified Medications   No medications on file  Discontinued Medications   CALCIUM CARBONATE-VITAMIN D (CALCIUM 600 + D PO)    Take 1 tablet by mouth daily.   FOLIC ACID (FOLVITE) 892 MCG TABLET    Take 800 mcg by mouth daily.    Review of Systems  Constitutional: Negative for fever, chills, diaphoresis, activity change, appetite change, fatigue and unexpected weight change.  HENT: Negative for congestion, ear discharge, ear pain, hearing loss, postnasal drip, rhinorrhea, sore throat, tinnitus, trouble swallowing and voice change.   Eyes: Negative for pain, redness, itching and visual disturbance.  Respiratory:  Negative for cough, choking, shortness of breath and wheezing.   Cardiovascular: Negative for chest pain, palpitations and leg swelling.  Gastrointestinal: Negative for nausea, abdominal pain,  diarrhea, constipation and abdominal distention.       Some heartburn. Using times regularly. Has [place] esophageal dysphagia. Food sticks in chest. Generally this is solids.  Endocrine: Negative for cold intolerance, heat intolerance, polydipsia, polyphagia and polyuria.  Genitourinary: Negative for dysuria, urgency, frequency, hematuria, flank pain, vaginal discharge, difficulty urinating and pelvic pain.       Intermittent incontinence. Has not seen urologist. History of cystocele.  Musculoskeletal: Negative for myalgias, back pain, arthralgias, gait problem, neck pain and neck stiffness.       History of osteopenia. Last bone density 2013.  Skin: Negative for color change, pallor and rash.       Split in the right toenail. Previous removal of this toenail and irregular regrowth.  Allergic/Immunologic: Negative.   Neurological: Positive for dizziness (Loss of balance with turning or walking fast.). Negative for tremors, seizures, syncope, weakness, numbness and headaches.       Her son would like her to have a neurology appointment for episodes of dizziness. Patient is reluctant to do this at this time area  Hematological: Negative for adenopathy. Does not bruise/bleed easily.       Essential thrombocythemia under treatment with Hydrea  Psychiatric/Behavioral: Positive for sleep disturbance (Insomnia). Negative for suicidal ideas, hallucinations, behavioral problems, confusion, dysphoric mood and agitation. The patient is not nervous/anxious and is not hyperactive.     Filed Vitals:   10/05/14 1618  BP: 150/84  Pulse: 68  Temp: 98.4 F (36.9 C)  TempSrc: Oral  Height: _0  (1.626 m)  Weight: 137 lb (62.143 kg)  SpO2: 93%   Body mass index is 23.5 kg/(m^2).  Physical Exam  Constitutional: She is oriented to person, place, and time. She appears well-developed and well-nourished. No distress.  HENT:  Right Ear: External ear normal.  Left Ear: External ear normal.  Nose: Nose  normal.  Mouth/Throat: Oropharynx is clear and moist. No oropharyngeal exudate.  Eyes: Conjunctivae and EOM are normal. Pupils are equal, round, and reactive to light. No scleral icterus.  Neck: No JVD present. No tracheal deviation present. No thyromegaly present.  Cardiovascular: Normal rate, regular rhythm, normal heart sounds and intact distal pulses.  Exam reveals no gallop and no friction rub.   No murmur heard. Pulmonary/Chest: Effort normal. No respiratory distress. She has no wheezes. She has no rales. She exhibits no tenderness.  Abdominal: She exhibits no distension and no mass. There is no tenderness.  Genitourinary:  Patient prefers female physician for pelvic exams.  Musculoskeletal: Normal range of motion. She exhibits no edema or tenderness.  Lymphadenopathy:    She has no cervical adenopathy.  Neurological: She is alert and oriented to person, place, and time. No cranial nerve deficit. Coordination normal.  Skin: No rash noted. She is not diaphoretic. No erythema. No pallor.  Split toenail right great toe.  Psychiatric: She has a normal mood and affect. Her behavior is normal. Judgment and thought content normal.    Labs reviewed: Lab Summary Latest Ref Rng 08/14/2014 06/21/2014 04/24/2014 02/24/2014 01/03/2014 11/07/2013  Hemoglobin 11.6 - 15.9 g/dL 11.4(L) 12.0 11.6 11.3(L) 12.2 11.5(L)  Hematocrit 34.8 - 46.6 % 34.0(L) 35.4 34.5(L) 34.3(L) 36.9 34.6(L)  White count 3.9 - 10.3 10e3/uL 3.8(L) 3.1(L) 3.8(L) 3.7(L) 3.2(L) 3.3(L)  Platelet count 145 - 400 10e3/uL 371 349 436(H) 301 379 426(H)  Sodium - (None) (None) (None) (None) (None) (None)  Potassium - (None) (None) (None) (None) (None) (None)  Calcium - (None) (None) (None) (None) (None) (None)  Phosphorus - (None) (None) (None) (None) (None) (None)  Creatinine - (None) (None) (None) (None) (None) (None)  AST - (None) (None) (None) (None) (None) (None)  Alk Phos - (None) (None) (None) (None) (None) (None)  Bilirubin -  (None) (None) (None) (None) (None) (None)  Glucose - (None) (None) (None) (None) (None) (None)  Cholesterol - (None) (None) (None) (None) (None) (None)  HDL cholesterol - (None) (None) (None) (None) (None) (None)  Triglycerides - (None) (None) (None) (None) (None) (None)  LDL Direct - (None) (None) (None) (None) (None) (None)  LDL Calc - (None) (None) (None) (None) (None) (None)  Total protein - (None) (None) (None) (None) (None) (None)  Albumin - (None) (None) (None) (None) (None) (None)   Lab Results  Component Value Date   BUN 16.2 09/06/2013   No results found for: HGBA1C Lab Results  Component Value Date   TSH 1.781 06/09/2011    Assessment/Plan  1. Essential hypertension Continue HCTZ -CMP, future  2. Essential thrombocythemia Continue current dose of Hydrea  -CBC, future   3. Essential hypertension, benign Continue HCTZ   4. DIZZINESS i offered neuro consult, but she prefers to wait  5. Restless leg syndrome Continue gabapentin. Discussed use of Lyrica or Cymbalta in the future  6. Osteopenia Schedule DEXA  7. Metatarsalgia of right foot Mid pain relievers as needed  8. Deglutition disorder Start Ranitidine 150 mg nightly   9. URINARY INCONTINENCE, URGE I will attempt to find a physician who combines skills of urology and gynecology.   10. Insomnia Use lorazepam or melatonin

## 2014-10-16 ENCOUNTER — Other Ambulatory Visit (HOSPITAL_BASED_OUTPATIENT_CLINIC_OR_DEPARTMENT_OTHER): Payer: Medicare PPO

## 2014-10-16 DIAGNOSIS — D693 Immune thrombocytopenic purpura: Secondary | ICD-10-CM | POA: Diagnosis not present

## 2014-10-16 DIAGNOSIS — D473 Essential (hemorrhagic) thrombocythemia: Secondary | ICD-10-CM

## 2014-10-16 LAB — CBC WITH DIFFERENTIAL/PLATELET
BASO%: 0.4 % (ref 0.0–2.0)
Basophils Absolute: 0 10*3/uL (ref 0.0–0.1)
EOS ABS: 0 10*3/uL (ref 0.0–0.5)
EOS%: 1 % (ref 0.0–7.0)
HCT: 33.8 % — ABNORMAL LOW (ref 34.8–46.6)
HGB: 11.4 g/dL — ABNORMAL LOW (ref 11.6–15.9)
LYMPH%: 36.8 % (ref 14.0–49.7)
MCH: 40.1 pg — AB (ref 25.1–34.0)
MCHC: 33.8 g/dL (ref 31.5–36.0)
MCV: 118.8 fL — AB (ref 79.5–101.0)
MONO#: 0.4 10*3/uL (ref 0.1–0.9)
MONO%: 12.3 % (ref 0.0–14.0)
NEUT#: 1.6 10*3/uL (ref 1.5–6.5)
NEUT%: 49.5 % (ref 38.4–76.8)
PLATELETS: 404 10*3/uL — AB (ref 145–400)
RBC: 2.85 10*6/uL — AB (ref 3.70–5.45)
RDW: 14 % (ref 11.2–14.5)
WBC: 3.2 10*3/uL — ABNORMAL LOW (ref 3.9–10.3)
lymph#: 1.2 10*3/uL (ref 0.9–3.3)

## 2014-10-16 LAB — COMPREHENSIVE METABOLIC PANEL (CC13)
ALBUMIN: 4.1 g/dL (ref 3.5–5.0)
ALK PHOS: 48 U/L (ref 40–150)
ALT: 34 U/L (ref 0–55)
AST: 27 U/L (ref 5–34)
Anion Gap: 8 mEq/L (ref 3–11)
BILIRUBIN TOTAL: 0.44 mg/dL (ref 0.20–1.20)
BUN: 15.2 mg/dL (ref 7.0–26.0)
CO2: 28 mEq/L (ref 22–29)
Calcium: 9 mg/dL (ref 8.4–10.4)
Chloride: 102 mEq/L (ref 98–109)
Creatinine: 0.9 mg/dL (ref 0.6–1.1)
EGFR: 64 mL/min/{1.73_m2} — ABNORMAL LOW (ref >90)
GLUCOSE: 93 mg/dL (ref 70–140)
POTASSIUM: 3.4 meq/L — AB (ref 3.5–5.1)
SODIUM: 138 meq/L (ref 136–145)
Total Protein: 6.7 g/dL (ref 6.4–8.3)

## 2014-10-16 LAB — URIC ACID (CC13): Uric Acid, Serum: 5.1 mg/dl (ref 2.6–7.4)

## 2014-10-16 LAB — LACTATE DEHYDROGENASE (CC13): LDH: 186 U/L (ref 125–245)

## 2014-10-30 ENCOUNTER — Other Ambulatory Visit: Payer: Self-pay | Admitting: Internal Medicine

## 2014-11-08 ENCOUNTER — Encounter: Payer: Self-pay | Admitting: Sports Medicine

## 2014-11-08 ENCOUNTER — Ambulatory Visit (INDEPENDENT_AMBULATORY_CARE_PROVIDER_SITE_OTHER): Payer: Medicare PPO | Admitting: Sports Medicine

## 2014-11-08 VITALS — BP 120/69 | HR 74 | Ht 64.0 in | Wt 135.0 lb

## 2014-11-08 DIAGNOSIS — M7741 Metatarsalgia, right foot: Secondary | ICD-10-CM | POA: Diagnosis not present

## 2014-11-08 NOTE — Progress Notes (Signed)
Jill Myers - 81 y.o. female MRN [id number]  Date of birth: [birth year]  CC: B/l forefoot pain.   SUBJECTIVE:   HPI  B/l foot pain:  - 30 years of foot pain, diagnosed as metarsalgia in the past.  - Limiting what she can do again, which is why she has come in today. .   - She notices it most on hard surfaces.  Carpet at home is much better. - Pain is sharp and worse on the right.  - Taking IBU 2 q 4hours at the most, usually much less.  - Currently wearing her orthotics with metatarsal pads that were added to the right orthotic in the past mostly to help with a Morton's neuroma.  - While at last year's visit she was having shooting pain which she identifies as the neuroma pain, that has mostly resolved.  - She believes most of her pain is currently her metatarsalgia. ROS:     14 point RoS negative in regards to orthopedic complaint above.   HISTORY: Past Medical, Surgical, Social, and Family History Reviewed & Updated per EMR.    OBJECTIVE: BP 120/69 mmHg  Pulse 74  Ht 5\' 4"  (1.626 m)  Wt 135 lb (61.236 kg)  BMI 23.16 kg/m2  Physical Exam   b/l Foot: Metatarsal squeeze painful TTP over the 2-4th metatarsal heads.Transverse arch collapse b/l.              Hallux valgus on the left which is lifting her tranverse arch.  Freely movable, non-rigid transvers are. .  Loss of forefoot plantar cushion No hallux valgus             Normal lateral ligaments Normal ankle strength to inversion, eversion, dorsiflexion, plantarflexion  B/l mild loss of longitudinal arch bilateral feet No hindfoot valgus  MEDICATIONS, LABS & OTHER ORDERS: Previous Medications   ASPIRIN 81 MG TABLET    Take 81 mg by mouth daily.     CHOLECALCIFEROL (VITAMIN D) 1000 UNITS TABLET    Take 1,000 Units by mouth daily.   DOCUSATE SODIUM (COLACE) 250 MG CAPSULE    Take 250 mg by mouth daily.   FISH OIL-OMEGA-3 FATTY ACIDS 1000 MG  CAPSULE    Take 1 g by mouth daily.     GABAPENTIN (NEURONTIN) 300 MG CAPSULE    TAKE ONE CAPSULE EACH AM 2 AT NOON AND 3 EACH EVENING   GLUCOSAMINE-CHONDROITIN 750-600 MG TABS    Take 1 tablet by mouth 2 (two) times daily.   HYDROCHLOROTHIAZIDE (HYDRODIURIL) 25 MG TABLET    TAKE 1 TABLET BY MOUTH DAILY   HYDROXYUREA (HYDREA) 500 MG CAPSULE    TAKE 2 CAPSULES (1,000 MG TOTAL) BY MOUTH DAILY. MAY TAKE WITH FOOD TO MINIMIZE GI SIDE EFFECTS.   IBUPROFEN (ADVIL,MOTRIN) 200 MG TABLET    Take 200 mg by mouth as needed.   LORAZEPAM (ATIVAN) 0.5 MG TABLET    May takeone by mouth at bedtime as needed for insomnia   MELATONIN 5 MG TABS    1 nightly as needed for sleep   MULTIPLE VITAMIN (MULTIVITAMIN) TABLET    Take 1 tablet by mouth daily.     OMEPRAZOLE (PRILOSEC) 20 MG CAPSULE    Take 20 mg by mouth daily.   OXYBUTYNIN (DITROPAN-XL) 5 MG 24 HR TABLET    TAKE 1 TABLET (5 MG TOTAL) BY MOUTH AT BEDTIME.   POLYETHYLENE GLYCOL (MIRALAX / GLYCOLAX) PACKET    Take 17 g by mouth daily.  RANITIDINE (ZANTAC) 150 MG TABLET    One twice daily to reduce stomach acid   Modified Medications   No medications on file   New Prescriptions   No medications on file   Discontinued Medications   No medications on file  No orders of the defined types were placed in this encounter.   ASSESSMENT & PLAN: See problem based charting & AVS for pt instructions.

## 2014-11-09 NOTE — Assessment & Plan Note (Signed)
Sxs again consistent with metatarsalgia.   She brought with her several pairs of orthotics.  We added metatarsal pads to the left side.  On one pair we added a dancer's pad to the left as her metatarsalgia is 2/2 her hallux valgus.  We added metatarsal cushioning to all of her orthotics.   She may benefit from the new orthotic material with extra metatrasal padding.  F/u PRN.

## 2014-11-13 ENCOUNTER — Encounter: Payer: Self-pay | Admitting: Oncology

## 2014-11-13 ENCOUNTER — Ambulatory Visit (INDEPENDENT_AMBULATORY_CARE_PROVIDER_SITE_OTHER): Payer: Medicare PPO | Admitting: Oncology

## 2014-11-13 VITALS — BP 141/62 | HR 68 | Temp 98.0°F | Ht 64.0 in | Wt 138.1 lb

## 2014-11-13 DIAGNOSIS — D473 Essential (hemorrhagic) thrombocythemia: Secondary | ICD-10-CM | POA: Diagnosis not present

## 2014-11-13 DIAGNOSIS — Z7982 Long term (current) use of aspirin: Secondary | ICD-10-CM

## 2014-11-13 NOTE — Patient Instructions (Signed)
Continue every 2 month labs (standing orders) MD visit in 6 months

## 2014-11-13 NOTE — Progress Notes (Signed)
Patient ID: PETE MERTEN, female   DOB: Mar 07, 1930, 81 y.o.   MRN: [id number] Hematology and Oncology Follow Up Visit  CLYDA SMYTH [id number] 01-Dec-1930 81 y.o. 11/13/2014 12:21 PM   Principle Diagnosis: Encounter Diagnosis  Name Primary?  . Essential thrombocythemia [facility]) Yes  Clinical Summary: Pleasant 81 year old woman diagnosed with essentials thrombocythemia in April 2005. Blood counts are well controlled on low-dose Hydrea 1000 mg daily and aspirin 81 mg daily. She has never had any thrombotic events. Hydrea controls her counts at the expense of a mild chronic leukopenia and anemia.   Interim History:   She continues to do well. 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 She remains active. She denies any severe headache, change in vision, slurred speech, numbness, or focal weakness. No dyspnea, chest pain, or palpitations. No change in bowel habit. No clinical bleeding. She will be changing her primary care physician to Dr. Levin Erp.  Medications: reviewed  Allergies:  Allergies  Allergen Reactions  . Sulfamethoxazole-Trimethoprim Rash    REACTION: Rash after completing course of Septra    Review of Systems: See history of present illness  Remaining ROS negative:   Physical Exam: Blood pressure 141/62, pulse 68, temperature 98 F (36.7 C), temperature source Oral, height 5\' 4"  (1.626 m), weight 138 lb 1.6 oz (62.642 kg), SpO2 96 %. Wt Readings from Last 3 Encounters:  11/13/14 138 lb 1.6 oz (62.642 kg)  11/08/14 135 lb (61.236 kg)  10/05/14 137 lb (62.143 kg)     General appearance: [name] HENNT: Pharynx no erythema, exudate, mass, or ulcer. No thyromegaly or thyroid nodules Lymph nodes: No cervical, supraclavicular, or axillary lymphadenopathy Breasts:  Lungs: Clear to auscultation, resonant to percussion throughout Heart: Regular rhythm, no murmur, no gallop, no rub, no click, no edema Abdomen: Soft,  nontender, normal bowel sounds, no mass, no organomegaly Extremities: No edema, no calf tenderness Musculoskeletal: no joint deformities GU:  Vascular: Carotid pulses 2+, no bruits,  Neurologic: Alert, oriented, PERRLA, optic discs sharp and vessels normal, no hemorrhage or exudate, cranial nerves grossly normal, motor strength 5 over 5, reflexes 1+ symmetric, upper body coordination normal, gait normal, Skin: No rash or ecchymosis  Lab Results: CBC W/Diff    Component Value Date/Time   WBC 3.2* 10/16/2014 1404   WBC 7.1 03/10/2007   RBC 2.85* 10/16/2014 1404   RBC 4.32 03/08/2007 1415   HGB 11.4* 10/16/2014 1404   HGB 12.5 12/05/2010 0825   HCT 33.8* 10/16/2014 1404   HCT 44.1 03/08/2007 1415   PLT 404* 10/16/2014 1404   PLT 425 03/10/2007   MCV 118.8* 10/16/2014 1404   MCV 102.3* 03/08/2007 1415   MCH 40.1* 10/16/2014 1404   MCH 36.1* 01/18/2010 1422   MCHC 33.8 10/16/2014 1404   MCHC 34.2 03/08/2007 1415   RDW 14.0 10/16/2014 1404   RDW 15.0 03/08/2007 1415   LYMPHSABS 1.2 10/16/2014 1404   LYMPHSABS 0.8 03/08/2007 1415   MONOABS 0.4 10/16/2014 1404   MONOABS 0.9 03/08/2007 1415   EOSABS 0.0 10/16/2014 1404   EOSABS 0.1 03/08/2007 1415   BASOSABS 0.0 10/16/2014 1404   BASOSABS 0.0 03/08/2007 1415     Chemistry      Component Value Date/Time   NA 138 10/16/2014 1404   NA 137 05/15/2011 1326   K 3.4* 10/16/2014 1404   K 3.9 05/15/2011 1326   CL 100 02/13/2012 1344   CL 101 05/15/2011 1326   CO2 28  10/16/2014 1404   CO2 26 05/15/2011 1326   BUN 15.2 10/16/2014 1404   BUN 20 05/15/2011 1326   CREATININE 0.9 10/16/2014 1404   CREATININE 0.93 05/15/2011 1326   CREATININE 0.77 08/16/2010 0935      Component Value Date/Time   CALCIUM 9.0 10/16/2014 1404   CALCIUM 9.1 05/15/2011 1326   ALKPHOS 48 10/16/2014 1404   ALKPHOS 41 05/15/2011 1326   AST 27 10/16/2014 1404   AST 17 05/15/2011 1326   ALT 34 10/16/2014 1404   ALT 20 05/15/2011 1326   BILITOT 0.44  10/16/2014 1404   BILITOT 0.4 05/15/2011 1326       Radiological Studies: No results found.  Impression:  #1. Essential thrombocythemia She remains stable on oral Hydrea and low-dose aspirin. Plan: Continue Hydrea at current dose with lab monitoring every 2 months. Hydrea prescription just renewed last in September 2016.  #2. Chronic leukopenia, macrocytic anemia, secondary to chronic Hydrea use.  #3. Essential hypertension  #4. Osteoporosis she is scheduled for a follow-up DEXA scan by her primary care physician  #4. Diverticulosis: no current GI symptoms  #5. Restless leg syndrome.  #5. Ligamentous laxity of the pelvis  CC: Patient Care Team: Estill Dooms, MD as PCP - General (Internal Medicine) Annia Belt, MD as Consulting Physician (Oncology) [name], MD as Consulting Physician (Dermatology) [name], MD as Consulting Physician (Sports Medicine) [name], MD as Consulting Physician (Family Medicine)   Annia Belt, MD 11/7/201612:21 PM

## 2014-11-20 ENCOUNTER — Ambulatory Visit
Admission: RE | Admit: 2014-11-20 | Discharge: 2014-11-20 | Disposition: A | Discharge status: Home / Self Care | Payer: Medicare PPO | Source: Ambulatory Visit | Attending: Internal Medicine | Admitting: Internal Medicine

## 2014-11-20 DIAGNOSIS — M8588 Other specified disorders of bone density and structure, other site: Secondary | ICD-10-CM | POA: Diagnosis not present

## 2014-11-20 DIAGNOSIS — M8580 Other specified disorders of bone density and structure, unspecified site: Secondary | ICD-10-CM

## 2014-12-18 ENCOUNTER — Other Ambulatory Visit (HOSPITAL_BASED_OUTPATIENT_CLINIC_OR_DEPARTMENT_OTHER): Payer: Medicare PPO

## 2014-12-18 DIAGNOSIS — D473 Essential (hemorrhagic) thrombocythemia: Secondary | ICD-10-CM | POA: Diagnosis not present

## 2014-12-18 LAB — CBC WITH DIFFERENTIAL/PLATELET
BASO%: 0.3 % (ref 0.0–2.0)
Basophils Absolute: 0 10*3/uL (ref 0.0–0.1)
EOS ABS: 0 10*3/uL (ref 0.0–0.5)
EOS%: 0.9 % (ref 0.0–7.0)
HCT: 34.7 % — ABNORMAL LOW (ref 34.8–46.6)
HEMOGLOBIN: 11.7 g/dL (ref 11.6–15.9)
LYMPH%: 40.8 % (ref 14.0–49.7)
MCH: 40.8 pg — ABNORMAL HIGH (ref 25.1–34.0)
MCHC: 33.7 g/dL (ref 31.5–36.0)
MCV: 120.9 fL — AB (ref 79.5–101.0)
MONO#: 0.4 10*3/uL (ref 0.1–0.9)
MONO%: 12.8 % (ref 0.0–14.0)
NEUT%: 45.2 % (ref 38.4–76.8)
NEUTROS ABS: 1.5 10*3/uL (ref 1.5–6.5)
Platelets: 340 10*3/uL (ref 145–400)
RBC: 2.87 10*6/uL — ABNORMAL LOW (ref 3.70–5.45)
RDW: 13.8 % (ref 11.2–14.5)
WBC: 3.2 10*3/uL — AB (ref 3.9–10.3)
lymph#: 1.3 10*3/uL (ref 0.9–3.3)

## 2014-12-20 ENCOUNTER — Telehealth: Payer: Self-pay | Admitting: *Deleted

## 2014-12-20 NOTE — Telephone Encounter (Signed)
-----   Message from Annia Belt, MD sent at 12/18/2014  6:24 PM EST ----- Call pt: lab stable; continue current dose of Hydrea

## 2014-12-20 NOTE — Telephone Encounter (Signed)
Pt called - no answer; left message,  Labs are stable and to continue current dose of Hydrea per Dr Beryle Beams. And to call if she has any questions.

## 2015-01-23 LAB — HEPATIC FUNCTION PANEL
ALT: 23 U/L (ref 7–35)
AST: 19 U/L (ref 13–35)
Alkaline Phosphatase: 46 U/L (ref 25–125)
BILIRUBIN, TOTAL: 0.6 mg/dL

## 2015-01-23 LAB — LIPID PANEL
Cholesterol: 128 mg/dL (ref 0–200)
HDL: 51 mg/dL (ref 35–70)
LDL CALC: 56 mg/dL
LDL/HDL RATIO: 2.5
TRIGLYCERIDES: 104 mg/dL (ref 40–160)

## 2015-01-23 LAB — BASIC METABOLIC PANEL
BUN: 14 mg/dL (ref 4–21)
CREATININE: 0.8 mg/dL (ref 0.5–1.1)
Glucose: 85 mg/dL
Potassium: 4 mmol/L (ref 3.4–5.3)
Sodium: 141 mmol/L (ref 137–147)

## 2015-01-23 LAB — CBC AND DIFFERENTIAL
HCT: 37 % (ref 36–46)
HEMOGLOBIN: 12.8 g/dL (ref 12.0–16.0)
NEUTROS ABS: 3 /uL
PLATELETS: 407 10*3/uL — AB (ref 150–399)
WBC: 2.9 10^3/mL

## 2015-01-23 LAB — TSH: TSH: 4.22 u[IU]/mL (ref 0.41–5.90)

## 2015-02-01 ENCOUNTER — Non-Acute Institutional Stay: Payer: 59 | Admitting: Nurse Practitioner

## 2015-02-01 ENCOUNTER — Encounter: Payer: Self-pay | Admitting: Nurse Practitioner

## 2015-02-01 DIAGNOSIS — D473 Essential (hemorrhagic) thrombocythemia: Secondary | ICD-10-CM

## 2015-02-01 DIAGNOSIS — I10 Essential (primary) hypertension: Secondary | ICD-10-CM | POA: Diagnosis not present

## 2015-02-01 DIAGNOSIS — G4700 Insomnia, unspecified: Secondary | ICD-10-CM | POA: Diagnosis not present

## 2015-02-01 DIAGNOSIS — N8110 Cystocele, unspecified: Secondary | ICD-10-CM | POA: Diagnosis not present

## 2015-02-01 DIAGNOSIS — G2581 Restless legs syndrome: Secondary | ICD-10-CM | POA: Diagnosis not present

## 2015-02-01 DIAGNOSIS — R1310 Dysphagia, unspecified: Secondary | ICD-10-CM

## 2015-02-01 DIAGNOSIS — K219 Gastro-esophageal reflux disease without esophagitis: Secondary | ICD-10-CM

## 2015-02-01 DIAGNOSIS — R42 Dizziness and giddiness: Secondary | ICD-10-CM | POA: Diagnosis not present

## 2015-02-01 DIAGNOSIS — N3941 Urge incontinence: Secondary | ICD-10-CM

## 2015-02-01 DIAGNOSIS — IMO0002 Reserved for concepts with insufficient information to code with codable children: Secondary | ICD-10-CM

## 2015-02-01 NOTE — Assessment & Plan Note (Signed)
Well controlled in this 82 yo patient on HCTZ>  No changes at this time.

## 2015-02-01 NOTE — Assessment & Plan Note (Signed)
Stable, continue Ranitidine.  

## 2015-02-01 NOTE — Assessment & Plan Note (Addendum)
Some trouble staying asleep,  Increased bedtime dose gabapentin (for RLS) didn't help, like to try Lorazepam prn

## 2015-02-01 NOTE — Assessment & Plan Note (Signed)
Patient with history c/w urge incontinence; also with concern for possible anterior cystocele. She performs Kegel maneuvers regularly (has for years)( and does not feel this is helping.  Oxybutynin helps a little; Vesicare did not help at all in the past. For urology evaluation.

## 2015-02-01 NOTE — Assessment & Plan Note (Signed)
Well managed with addition of gabapentin.

## 2015-02-01 NOTE — Assessment & Plan Note (Signed)
Urinary frequency, hesitancy, continue Oxybutynin, urology referral.

## 2015-02-01 NOTE — Assessment & Plan Note (Signed)
F/u oncology 

## 2015-02-01 NOTE — Progress Notes (Signed)
Patient ID: Jill Myers, female   DOB: 15-Dec-1930, 82 y.o.   MRN: [id number]  Location:  clinic [place] Provider:  Marlana Latus NP  Code Status:  DNR Goals of care: Advanced Directive information Does patient have an advance directive?: Yes, Type of Advance Directive: Healthcare Power of Attorney  Chief Complaint  Patient presents with  . Medical Management of Chronic Issues    blood pressure, insomnia     HPI: Patient is a 82 y.o. female seen in the clinic at [facility] [place] today for evaluation of  HTN, restless leg syndrome, insomnia  Review of Systems:  Review of Systems  Constitutional: Negative for fever, chills and diaphoresis.  HENT: Negative for congestion, ear discharge, ear pain, hearing loss, sore throat and tinnitus.   Eyes: Negative for pain and redness.  Respiratory: Negative for cough, shortness of breath and wheezing.   Cardiovascular: Negative for chest pain, palpitations and leg swelling.  Gastrointestinal: Negative for nausea, abdominal pain, diarrhea and constipation.       Some heartburn. Using times regularly. Has [place] esophageal dysphagia. Food sticks in chest. Generally this is solids.  Genitourinary: Negative for dysuria, urgency, frequency, hematuria and flank pain.       Intermittent incontinence. Has not seen urologist. History of cystocele.  Musculoskeletal: Negative for myalgias, back pain and neck pain.       History of osteopenia. Last bone density 2013.  Skin: Negative for rash.       Split in the right toenail. Previous removal of this toenail and irregular regrowth.  Neurological: Positive for dizziness (Loss of balance with turning or walking fast.). Negative for tremors, seizures, weakness and headaches.       Her son would like her to have a neurology appointment for episodes of dizziness. Patient is reluctant to do this at this time area  Endo/Heme/Allergies: Negative for polydipsia. Does not bruise/bleed easily.       Essential  thrombocythemia under treatment with Hydrea  Psychiatric/Behavioral: Negative for suicidal ideas and hallucinations. The patient is not nervous/anxious.     Past Medical History  Diagnosis Date  . Diverticulitis   . Myeloma ([place])     prolefica  . Restless legs syndrome (RLS)   . Essential thrombocythemia ([place]) 01/31/11  . Osteopenia   . Bladder cystocele 05/21/2010  . Deglutition disorder 08/17/2012    Single episode of choking/aspiration; preceded by trouble swallowing saliva (thin liquids).  For SLP evaluation. Aug 17, 2012.    [name] Disease of blood and blood forming organ 01/05/2007    Qualifier: Diagnosis of  By: Oneida Alar MD, KARL    . DIVERTICULOSIS OF COLON 03/05/2006    Qualifier: Diagnosis of  By: Samara Snide    . DIZZINESS 01/18/2010    Qualifier: Diagnosis of  By: Lindell Noe MD, Jeneen Rinks    . Essential hypertension, benign 02/22/2008    Qualifier: Diagnosis of  By: Lindell Noe MD, Jeneen Rinks     . Family history of Alzheimer's disease 11/11/2013    Patient with family history of Alzheimers disease. She herself is very highly functional.  Plans for future visit with MMSE as primary function of the visit.    [name] GERD (gastroesophageal reflux disease) 08/16/2010  . HALLUX VALGUS, ACQUIRED 04/10/2009    Qualifier: Diagnosis of  By: [name] CMA,, [place]    . HYPERTRIGLYCERIDEMIA 01/05/2007    Qualifier: Diagnosis of  By: Oneida Alar MD, KARL    . Insomnia 02/10/2014  . Left-sided chest wall pain 12/19/2011  L sided chest wall pain follows a dermatomal distribution and raises the possibility of thoracic radiculopathic pain, which would be expected to improve with gabapentin as well. We discussed this at length and she will call to make me aware if this does not get better. Rib belt/girdle in the meantime; may consider rib films or CXR if not improving.     . Metatarsalgia of right foot 11/28/2013    Referral to [facility]   . Restless leg syndrome 06/09/2011  . URINARY INCONTINENCE, URGE, MILD 11/27/2009    Qualifier:  Diagnosis of  By: Zebedee Iba NP, Manuela Schwartz    . Vitamin D deficiency 08/16/2010    Patient Active Problem List   Diagnosis Date Noted  . Osteopenia 10/06/2014  . Hypertension   . Essential thrombocythemia ([place]) 05/08/2014  . Cystitis 02/20/2014  . Insomnia 02/10/2014  . Metatarsalgia of right foot 11/28/2013  . Family history of Alzheimer's disease 11/11/2013  . Deglutition disorder 08/17/2012  . Left-sided chest wall pain 12/19/2011  . Restless leg syndrome 06/09/2011  . GERD (gastroesophageal reflux disease) 08/16/2010  . Vitamin D deficiency 08/16/2010  . Advanced directives, counseling/discussion 08/16/2010  . Dysuria 05/21/2010  . Bladder cystocele 05/21/2010  . DIZZINESS 01/18/2010  . URINARY INCONTINENCE, URGE, MILD 11/27/2009  . HALLUX VALGUS, ACQUIRED 04/10/2009  . Essential hypertension, benign 02/22/2008  . HYPERTRIGLYCERIDEMIA 01/05/2007  . Disease of blood and blood forming organ 01/05/2007  . DIVERTICULOSIS OF COLON 03/05/2006    Allergies  Allergen Reactions  . Sulfamethoxazole-Trimethoprim Rash    REACTION: Rash after completing course of Septra    Medications: Patient's Medications  New Prescriptions   No medications on file  Previous Medications   ASPIRIN 81 MG TABLET    Take 81 mg by mouth daily.     CHOLECALCIFEROL (VITAMIN D) 1000 UNITS TABLET    Take 1,000 Units by mouth daily.   DOCUSATE SODIUM (COLACE) 250 MG CAPSULE    Take 250 mg by mouth daily.   FISH OIL-OMEGA-3 FATTY ACIDS 1000 MG CAPSULE    Take 1 g by mouth daily.     GABAPENTIN (NEURONTIN) 300 MG CAPSULE    TAKE ONE CAPSULE EACH AM 2 AT NOON AND 3 EACH EVENING   GLUCOSAMINE-CHONDROITIN 750-600 MG TABS    Take 1 tablet by mouth 2 (two) times daily.   HYDROCHLOROTHIAZIDE (HYDRODIURIL) 25 MG TABLET    TAKE 1 TABLET BY MOUTH DAILY   HYDROXYUREA (HYDREA) 500 MG CAPSULE    TAKE 2 CAPSULES (1,000 MG TOTAL) BY MOUTH DAILY. MAY TAKE WITH FOOD TO MINIMIZE GI SIDE EFFECTS.   IBUPROFEN (ADVIL,MOTRIN) 200 MG  TABLET    Take 200 mg by mouth as needed.   LORAZEPAM (ATIVAN) 0.5 MG TABLET    May takeone by mouth at bedtime as needed for insomnia   MELATONIN 5 MG TABS    1 nightly as needed for sleep   MULTIPLE VITAMIN (MULTIVITAMIN) TABLET    Take 1 tablet by mouth daily.     OMEPRAZOLE (PRILOSEC) 20 MG CAPSULE    Take 20 mg by mouth daily.   OXYBUTYNIN (DITROPAN-XL) 5 MG 24 HR TABLET    TAKE 1 TABLET (5 MG TOTAL) BY MOUTH AT BEDTIME.   POLYETHYLENE GLYCOL (MIRALAX / GLYCOLAX) PACKET    Take 17 g by mouth daily.    Modified Medications   No medications on file  Discontinued Medications   RANITIDINE (ZANTAC) 150 MG TABLET    One twice daily to reduce stomach acid  Physical Exam: There were no vitals filed for this visit. There is no weight on file to calculate BMI.  Physical Exam  Constitutional: She is oriented to person, place, and time. She appears well-developed and well-nourished. No distress.  HENT:  Right Ear: External ear normal.  Left Ear: External ear normal.  Nose: Nose normal.  Mouth/Throat: Oropharynx is clear and moist. No oropharyngeal exudate.  Eyes: Conjunctivae and EOM are normal. Pupils are equal, round, and reactive to light. No scleral icterus.  Neck: No JVD present. No tracheal deviation present. No thyromegaly present.  Cardiovascular: Normal rate, regular rhythm, normal heart sounds and intact distal pulses.  Exam reveals no gallop and no friction rub.   No murmur heard. Pulmonary/Chest: Effort normal. No respiratory distress. She has no wheezes. She has no rales. She exhibits no tenderness.  Abdominal: She exhibits no distension and no mass. There is no tenderness.  Genitourinary:  Patient prefers female physician for pelvic exams.  Musculoskeletal: Normal range of motion. She exhibits no edema or tenderness.  Lymphadenopathy:    She has no cervical adenopathy.  Neurological: She is alert and oriented to person, place, and time. No cranial nerve deficit.  Coordination normal.  Skin: No rash noted. She is not diaphoretic. No erythema. No pallor.  Split toenail right great toe.  Psychiatric: She has a normal mood and affect. Her behavior is normal. Judgment and thought content normal.    Labs reviewed: Basic Metabolic Panel:  Recent Labs  10/16/14 1404 01/23/15  NA 138 141  K 3.4* 4.0  CO2 28  --   GLUCOSE 93  --   BUN 15.2 14  CREATININE 0.9 0.8  CALCIUM 9.0  --     Liver Function Tests:  Recent Labs  10/16/14 1404 01/23/15  AST 27 19  ALT 34 23  ALKPHOS 48 46  BILITOT 0.44  --   PROT 6.7  --   ALBUMIN 4.1  --     CBC:  Recent Labs  08/14/14 1348 10/16/14 1404 12/18/14 1342 01/23/15  WBC 3.8* 3.2* 3.2* 2.9  NEUTROABS 2.0 1.6 1.5 3  HGB 11.4* 11.4* 11.7 12.8  HCT 34.0* 33.8* 34.7* 37  MCV 118.5* 118.8* 120.9*  --   PLT 371 404* 340 407*    Lab Results  Component Value Date   TSH 4.22 01/23/2015   No results found for: HGBA1C Lab Results  Component Value Date   CHOL 128 01/23/2015   HDL 51 01/23/2015   LDLCALC 56 01/23/2015   TRIG 104 01/23/2015   CHOLHDL 3.1 08/16/2010    Significant Diagnostic Results since last visit: none  Patient Care Team: Estill Dooms, MD as PCP - General (Internal Medicine) Annia Belt, MD as Consulting Physician (Oncology) [name], MD as Consulting Physician (Dermatology) [name], MD as Consulting Physician (Sports Medicine) [name], MD as Consulting Physician (Family Medicine)  Assessment/Plan Problem List Items Addressed This Visit    URINARY INCONTINENCE, URGE, MILD - Primary (Chronic)    Patient with history c/w urge incontinence; also with concern for possible anterior cystocele. She performs Kegel maneuvers regularly (has for years)( and does not feel this is helping.  Oxybutynin helps a little; Vesicare did not help at all in the past. For urology evaluation.       Relevant Orders   Ambulatory referral to Urology   Restless leg  syndrome (Chronic)    Well managed with addition of gabapentin.       Insomnia (Chronic)  Some trouble staying asleep,  Increased bedtime dose gabapentin (for RLS) didn't help, like to try Lorazepam prn      Hypertension (Chronic)    Well controlled in this 82yo patient on HCTZ, no changes at this time.       GERD (gastroesophageal reflux disease)    Stable, continue Ranitidine.       Essential thrombocythemia ([place]) (Chronic)    F/u oncology      Essential hypertension, benign (Chronic)    Well controlled in this 82 yo patient on HCTZ>  No changes at this time.       DIZZINESS (Chronic)    On and off      Deglutition disorder (Chronic)    Single episode of choking/aspiration; preceded by trouble swallowing saliva (thin liquids).  For SLP evaluation. Aug 17, 2012.       Bladder cystocele    Urinary frequency, hesitancy, continue Oxybutynin, urology referral.           Family/ staff Communication: continue IL, urology referral.   Labs/tests ordered: none  [facility] Mast NP Geriatrics [facility] [address], [place] [address] On Call:  [phone] & follow prompts after 5pm & weekends Office Phone:  [phone] Office Fax:  [phone]

## 2015-02-01 NOTE — Assessment & Plan Note (Signed)
On and off

## 2015-02-01 NOTE — Assessment & Plan Note (Signed)
Single episode of choking/aspiration; preceded by trouble swallowing saliva (thin liquids).  For SLP evaluation. Aug 17, 2012.

## 2015-02-01 NOTE — Assessment & Plan Note (Signed)
Well controlled in this 82yo patient on HCTZ, no changes at this time.

## 2015-03-01 ENCOUNTER — Other Ambulatory Visit: Payer: Self-pay | Admitting: Internal Medicine

## 2015-03-01 ENCOUNTER — Other Ambulatory Visit: Payer: Self-pay | Admitting: Family Medicine

## 2015-03-11 ENCOUNTER — Other Ambulatory Visit: Payer: Self-pay | Admitting: Oncology

## 2015-03-16 ENCOUNTER — Other Ambulatory Visit: Payer: Self-pay | Admitting: Oncology

## 2015-03-20 ENCOUNTER — Encounter: Payer: Self-pay | Admitting: Nurse Practitioner

## 2015-04-13 ENCOUNTER — Telehealth: Payer: Self-pay | Admitting: *Deleted

## 2015-04-13 NOTE — Telephone Encounter (Signed)
Pt wanted to know if her labs could be drawn by the nurse at [facility] at [place] where she lives; and if I could call and find out. I called and talked Tywan LPN ([id number]) - she said they can draw labs; they need an order with how often, ICD code, and fax number. And they draw labs only on Tues and Thursday. Their fax# is [phone]. [name] next  office appt is 5/16; no lab appt has been scheduled.

## 2015-04-16 ENCOUNTER — Ambulatory Visit (INDEPENDENT_AMBULATORY_CARE_PROVIDER_SITE_OTHER): Payer: Medicare Other | Admitting: Sports Medicine

## 2015-04-16 ENCOUNTER — Encounter: Payer: Self-pay | Admitting: Sports Medicine

## 2015-04-16 VITALS — BP 123/74 | Ht 63.0 in | Wt 128.0 lb

## 2015-04-16 DIAGNOSIS — G5763 Lesion of plantar nerve, bilateral lower limbs: Secondary | ICD-10-CM | POA: Diagnosis not present

## 2015-04-16 NOTE — Patient Instructions (Addendum)
Start taking Vit B6 100mg  a day. And take another 300mg  of gabapentin during the day for a total of 1200mg  total. Take ibuprofen 200mg  as needed

## 2015-04-16 NOTE — Progress Notes (Signed)
Patient ID: Jill Myers, female   DOB: October 26, 1930, 82 y.o.   MRN: [id number]   Reports of pain on the neuroma under the toes on both feet. Taking 900mg  of gabapentin each night.  Has orthotics with a neuroma pad applied on the left and a metatarsal pad on the right The left pad has caused more pain  She has been in orthotics for years  These helped lessen arch and general foot pain  Pat Hx Restless leg syndrome  ROS Burning out to tip of toes sometimes with night pain Varicosities but these are not painful  Exam: Pleasant older lady BP 123/74 mmHg  Ht 5\' 3"  (1.6 m)  Wt 128 lb (58.06 kg)  BMI 22.68 kg/m2  Right foot has hallux rigidis,  loss of metatarsal fat pad,  squeeze test is painful  Left foot doesn't have much pain during palpation of the metatarsals,  squeeze test not as painful Bunion painful with full extension on left great toe  Superficial varicosities Mild lower leg swelling with no pitting  Assess Transverse arch collapse Neurogenic pain in forfoot  Plan Re-padded the orthotics Start taking Vit B6 100mg  a day And take another 300mg  of gabapentin during the day for a total of 1200mg  a day. We will order a special orthotic with forefoot padding

## 2015-04-16 NOTE — Assessment & Plan Note (Signed)
I am not sure that she does not have some chronic neuropathy Possibly affected by restless leg/  MT pain as in past was not really present today  See plan  Also order special orthotic to lessen forefoot pressure

## 2015-04-25 ENCOUNTER — Telehealth: Payer: Self-pay | Admitting: *Deleted

## 2015-04-25 NOTE — Telephone Encounter (Signed)
Received lab results from [place] - pt called; no answer left message -  "stay on current dose of Hydrea" per Dr Beryle Beams. And to call if she has any questions.

## 2015-05-06 ENCOUNTER — Other Ambulatory Visit: Payer: Self-pay | Admitting: Internal Medicine

## 2015-05-08 ENCOUNTER — Other Ambulatory Visit: Payer: Self-pay | Admitting: Family Medicine

## 2015-05-10 ENCOUNTER — Encounter: Payer: Self-pay | Admitting: Nurse Practitioner

## 2015-05-10 ENCOUNTER — Non-Acute Institutional Stay: Payer: Medicare Other | Admitting: Nurse Practitioner

## 2015-05-10 VITALS — BP 128/60 | HR 82 | Temp 97.7°F | Ht 63.0 in | Wt 138.0 lb

## 2015-05-10 DIAGNOSIS — R42 Dizziness and giddiness: Secondary | ICD-10-CM

## 2015-05-10 DIAGNOSIS — I10 Essential (primary) hypertension: Secondary | ICD-10-CM | POA: Diagnosis not present

## 2015-05-10 DIAGNOSIS — K219 Gastro-esophageal reflux disease without esophagitis: Secondary | ICD-10-CM | POA: Diagnosis not present

## 2015-05-10 DIAGNOSIS — D473 Essential (hemorrhagic) thrombocythemia: Secondary | ICD-10-CM | POA: Diagnosis not present

## 2015-05-10 DIAGNOSIS — G2581 Restless legs syndrome: Secondary | ICD-10-CM | POA: Diagnosis not present

## 2015-05-10 DIAGNOSIS — N8110 Cystocele, unspecified: Secondary | ICD-10-CM

## 2015-05-10 DIAGNOSIS — R1310 Dysphagia, unspecified: Secondary | ICD-10-CM | POA: Diagnosis not present

## 2015-05-10 DIAGNOSIS — IMO0002 Reserved for concepts with insufficient information to code with codable children: Secondary | ICD-10-CM

## 2015-05-10 DIAGNOSIS — G4700 Insomnia, unspecified: Secondary | ICD-10-CM

## 2015-05-10 NOTE — Assessment & Plan Note (Signed)
F/u oncology 

## 2015-05-10 NOTE — Assessment & Plan Note (Signed)
Well controlled,  on HCTZ, no changes at this time.

## 2015-05-10 NOTE — Assessment & Plan Note (Addendum)
Some trouble staying asleep, failed lorazepam, will try Mirtazapine 7.5mg  nightly.

## 2015-05-10 NOTE — Assessment & Plan Note (Signed)
Single episode of choking/aspiration; preceded by trouble swallowing saliva (thin liquids).  For SLP evaluation. Aug 17, 2012.

## 2015-05-10 NOTE — Assessment & Plan Note (Signed)
On and off

## 2015-05-10 NOTE — Assessment & Plan Note (Addendum)
Feet pain, restless legs, continue gabapentin 300mg am and 900mg pm   

## 2015-05-10 NOTE — Assessment & Plan Note (Signed)
Stable, continue Omeprazole 20mg daily.  

## 2015-05-10 NOTE — Assessment & Plan Note (Addendum)
Urinary frequency, hesitancy, better with Vesicare, f/u urology

## 2015-05-10 NOTE — Assessment & Plan Note (Signed)
Well controlled in this 82 yo patient on HCTZ, No changes at this time. 01/23/15 Na 141, K 4.0, Bun 14, creat 0.8

## 2015-05-10 NOTE — Progress Notes (Signed)
Patient ID: Jill Myers, female   DOB: 05-18-1930, 82 y.o.   MRN: [id number]   Location:  [facility] (12) Provider: Marlana Latus NP  Code Status: DNR Goals of Care:  Advanced Directives 05/10/2015  Does patient have an advance directive? Yes  Type of Advance Directive [place]  Does patient want to make changes to advanced directive? No - Patient declined  Copy of advanced directive(s) in chart? Yes  Would patient like information on creating an advanced directive? -     Chief Complaint  Patient presents with  . Medical Management of Chronic Issues    Routine Visit    HPI: Patient is a 82 y.o. female seen today for chronic medical conditions. Hx of restless legs/peripheral neuropathy, managed with Neurontin 300mg  am and 900mg  nightly. Her blood pressure is controlled on HCTZ 25mg . Urinary frequency is stable, on Vesicare nightly. Persisted insomnia, failed Lorazepam.   Past Medical History  Diagnosis Date  . Diverticulitis   . Myeloma ([place])     prolefica  . Restless legs syndrome (RLS)   . Essential thrombocythemia ([place]) 01/31/11  . Osteopenia   . Bladder cystocele 05/21/2010  . Deglutition disorder 08/17/2012    Single episode of choking/aspiration; preceded by trouble swallowing saliva (thin liquids).  For SLP evaluation. Aug 17, 2012.    [name] Disease of blood and blood forming organ 01/05/2007    Qualifier: Diagnosis of  By: Oneida Alar MD, KARL    . DIVERTICULOSIS OF COLON 03/05/2006    Qualifier: Diagnosis of  By: Samara Snide    . DIZZINESS 01/18/2010    Qualifier: Diagnosis of  By: Lindell Noe MD, Jeneen Rinks    . Essential hypertension, benign 02/22/2008    Qualifier: Diagnosis of  By: Lindell Noe MD, Jeneen Rinks     . Family history of Alzheimer's disease 11/11/2013    Patient with family history of Alzheimers disease. She herself is very highly functional.  Plans for future visit with MMSE as primary function of the visit.    [name] GERD  (gastroesophageal reflux disease) 08/16/2010  . HALLUX VALGUS, ACQUIRED 04/10/2009    Qualifier: Diagnosis of  By: [name] CMA,, [place]    . HYPERTRIGLYCERIDEMIA 01/05/2007    Qualifier: Diagnosis of  By: Oneida Alar MD, KARL    . Insomnia 02/10/2014  . Left-sided chest wall pain 12/19/2011     L sided chest wall pain follows a dermatomal distribution and raises the possibility of thoracic radiculopathic pain, which would be expected to improve with gabapentin as well. We discussed this at length and she will call to make me aware if this does not get better. Rib belt/girdle in the meantime; may consider rib films or CXR if not improving.     . Metatarsalgia of right foot 11/28/2013    Referral to [facility]   . Restless leg syndrome 06/09/2011  . URINARY INCONTINENCE, URGE, MILD 11/27/2009    Qualifier: Diagnosis of  By: Zebedee Iba NP, Manuela Schwartz    . Vitamin D deficiency 08/16/2010    Past Surgical History  Procedure Laterality Date  . Cholecystectomy  1992  . Nailbed repair  12/05/2010    Procedure: NAILBED REPAIR;  Surgeon: Cammie Sickle., MD;  Location: [place];  Service: Orthopedics;  Laterality: Right;  full thickness nail biopsy right hallux  . Cataract extraction  2010    Dr. [name]  . Torn tenden  2012    hand Dr. Orie Rout  .  Mohs surgery  2012    on face [facility]  . Biopsy breast Left 1998    Dr. Rebekah Chesterfield  . Colonoscopy  2008    Allergies  Allergen Reactions  . Sulfamethoxazole-Trimethoprim Rash    REACTION: Rash after completing course of Septra      Medication List       This list is accurate as of: 05/10/15 11:59 PM.  Always use your most recent med list.               aspirin 81 MG tablet  Take 81 mg by mouth daily.     cholecalciferol 1000 units tablet  Commonly known as:  VITAMIN D  Take 1,000 Units by mouth daily.     docusate sodium 250 MG capsule  Commonly known as:  COLACE  Take 250 mg by mouth daily.     fish oil-omega-3 fatty  acids 1000 MG capsule  Take 1 g by mouth daily.     fluticasone 50 MCG/ACT nasal spray  Commonly known as:  FLONASE  SPRAY EACH NOSTRIL TWICE A DAY     gabapentin 300 MG capsule  Commonly known as:  NEURONTIN  TAKE ONE CAPSULE BY MOUTH EVERY MORNING 2 CAPS AT NOON AND 3 CAPS AT EVENING     Glucosamine-Chondroitin 750-600 MG Tabs  Take 1 tablet by mouth 2 (two) times daily.     hydrochlorothiazide 25 MG tablet  Commonly known as:  HYDRODIURIL  TAKE 1 TABLET BY MOUTH DAILY     hydroxyurea 500 MG capsule  Commonly known as:  HYDREA  TAKE 2 CAPSULES (1,000 MG TOTAL) BY MOUTH DAILY. MAY TAKE WITH FOOD TO MINIMIZE GI SIDE EFFECTS.     ibuprofen 200 MG tablet  Commonly known as:  ADVIL,MOTRIN  Take 200 mg by mouth as needed.     multivitamin tablet  Take 1 tablet by mouth daily.     omeprazole 20 MG capsule  Commonly known as:  PRILOSEC  Take 20 mg by mouth daily.     polyethylene glycol packet  Commonly known as:  MIRALAX / GLYCOLAX  Take 17 g by mouth daily.     pyridOXINE 100 MG tablet  Commonly known as:  VITAMIN B-6  Take 100 mg by mouth daily.     VESICARE PO  Take by mouth.        Review of Systems:  Review of Systems  Constitutional: Negative for fever, chills and diaphoresis.  HENT: Negative for congestion, ear discharge, ear pain, hearing loss, sore throat and tinnitus.   Eyes: Negative for pain and redness.  Respiratory: Negative for cough, shortness of breath and wheezing.   Cardiovascular: Negative for chest pain, palpitations and leg swelling.  Gastrointestinal: Negative for nausea, abdominal pain, diarrhea and constipation.       Some heartburn. Using times regularly. Has [place] esophageal dysphagia. Food sticks in chest. Generally this is solids.  Endocrine: Negative for polydipsia.  Genitourinary: Negative for dysuria, urgency, frequency, hematuria and flank pain.       Intermittent incontinence. Has not seen urologist. History of cystocele.    Musculoskeletal: Negative for myalgias, back pain and neck pain.       History of osteopenia. Last bone density 2013.  Skin: Negative for rash.       Split in the right toenail. Previous removal of this toenail and irregular regrowth.  Neurological: Positive for dizziness (Loss of balance with turning or walking fast.). Negative for tremors, seizures, weakness and  headaches.       Her son would like her to have a neurology appointment for episodes of dizziness. Patient is reluctant to do this at this time Pain feet, restless legs, takes Neurontin  Hematological: Does not bruise/bleed easily.       Essential thrombocythemia under treatment with Hydrea  Psychiatric/Behavioral: Positive for sleep disturbance. Negative for suicidal ideas and hallucinations. The patient is not nervous/anxious.     Health Maintenance  Topic Date Due  . PNA vac Low Risk Adult (2 of 2 - PCV13) 02/21/2009  . INFLUENZA VACCINE  08/07/2015  . TETANUS/TDAP  04/11/2019  . DEXA SCAN  Completed  . ZOSTAVAX  Completed    Physical Exam: Filed Vitals:   05/10/15 1347  BP: 128/60  Pulse: 82  Temp: 97.7 F (36.5 C)  TempSrc: Oral  Height: 5\' 3"  (1.6 m)  Weight: 138 lb (62.596 kg)   Body mass index is 24.45 kg/(m^2). Physical Exam  Constitutional: She is oriented to person, place, and time. She appears well-developed and well-nourished. No distress.  HENT:  Right Ear: External ear normal.  Left Ear: External ear normal.  Nose: Nose normal.  Mouth/Throat: Oropharynx is clear and moist. No oropharyngeal exudate.  Eyes: Conjunctivae and EOM are normal. Pupils are equal, round, and reactive to light. No scleral icterus.  Neck: No JVD present. No tracheal deviation present. No thyromegaly present.  Cardiovascular: Normal rate, regular rhythm and intact distal pulses.  Exam reveals no gallop and no friction rub.   Murmur heard. 2-3 systolic murmurs  Pulmonary/Chest: Effort normal. No respiratory distress. She has  no wheezes. She has no rales. She exhibits no tenderness.  Abdominal: She exhibits no distension and no mass. There is no tenderness.  Genitourinary:  Patient prefers female physician for pelvic exams.  Musculoskeletal: Normal range of motion. She exhibits no edema or tenderness.  Lymphadenopathy:    She has no cervical adenopathy.  Neurological: She is alert and oriented to person, place, and time. No cranial nerve deficit. Coordination normal.  Skin: No rash noted. She is not diaphoretic. No erythema. No pallor.  Split toenail right great toe.  Psychiatric: She has a normal mood and affect. Her behavior is normal. Judgment and thought content normal.    Labs reviewed: Basic Metabolic Panel:  Recent Labs  10/16/14 1404 01/23/15  NA 138 141  K 3.4* 4.0  CO2 28  --   GLUCOSE 93  --   BUN 15.2 14  CREATININE 0.9 0.8  CALCIUM 9.0  --   TSH  --  4.22   Liver Function Tests:  Recent Labs  10/16/14 1404 01/23/15  AST 27 19  ALT 34 23  ALKPHOS 48 46  BILITOT 0.44  --   PROT 6.7  --   ALBUMIN 4.1  --    No results for input(s): LIPASE, AMYLASE in the last 8760 hours. No results for input(s): AMMONIA in the last 8760 hours. CBC:  Recent Labs  08/14/14 1348 10/16/14 1404 12/18/14 1342 01/23/15  WBC 3.8* 3.2* 3.2* 2.9  NEUTROABS 2.0 1.6 1.5 3  HGB 11.4* 11.4* 11.7 12.8  HCT 34.0* 33.8* 34.7* 37  MCV 118.5* 118.8* 120.9*  --   PLT 371 404* 340 407*   Lipid Panel:  Recent Labs  01/23/15  CHOL 128  HDL 51  LDLCALC 56  TRIG 104   No results found for: HGBA1C  Procedures since last visit: No results found.  Assessment/Plan Bladder cystocele Urinary frequency, hesitancy, better with  Vesicare, f/u urology    Deglutition disorder Single episode of choking/aspiration; preceded by trouble swallowing saliva (thin liquids).  For SLP evaluation. Aug 17, 2012.    DIZZINESS On and off   Essential hypertension, benign Well controlled in this 82 yo patient on  HCTZ, No changes at this time. 01/23/15 Na 141, K 4.0, Bun 14, creat 0.8   Essential thrombocythemia ([facility]) F/u oncology   GERD (gastroesophageal reflux disease) Stable, continue Omeprazole 20mg  daily   Hypertension Well controlled,  on HCTZ, no changes at this time.    Insomnia Some trouble staying asleep, failed lorazepam, will try Mirtazapine 7.5mg  nightly.   Restless leg syndrome Feet pain, restless legs, continue gabapentin 300mg  am and 900mg  pm      Labs/tests ordered:  @ORDERS @ CBC, CMP, TSH prior to next appointment.   Next appt:  Visit date not found

## 2015-05-16 ENCOUNTER — Encounter: Payer: Self-pay | Admitting: Oncology

## 2015-05-16 ENCOUNTER — Telehealth: Payer: Self-pay | Admitting: *Deleted

## 2015-05-16 NOTE — Telephone Encounter (Signed)
Yes - or she can just get labs day of visit here

## 2015-05-16 NOTE — Telephone Encounter (Signed)
Call from pt - has an appt May 16; she wants to know if she needs labs drawn prior to the [place] labs were 4/18 @ [facility]. Thanks

## 2015-05-17 NOTE — Telephone Encounter (Signed)
Pt called - no answer; left message labs can be done at [facility]( need order fax) or can wait until her appt next week. And she can call me back if want labs done at [facility].

## 2015-05-22 ENCOUNTER — Encounter: Payer: Self-pay | Admitting: Internal Medicine

## 2015-05-22 ENCOUNTER — Other Ambulatory Visit: Payer: Self-pay | Admitting: Oncology

## 2015-05-22 ENCOUNTER — Ambulatory Visit (INDEPENDENT_AMBULATORY_CARE_PROVIDER_SITE_OTHER): Payer: Medicare Other | Admitting: Oncology

## 2015-05-22 ENCOUNTER — Encounter: Payer: Self-pay | Admitting: Oncology

## 2015-05-22 VITALS — BP 147/59 | HR 78 | Temp 98.4°F | Ht 63.0 in | Wt 137.6 lb

## 2015-05-22 DIAGNOSIS — D539 Nutritional anemia, unspecified: Secondary | ICD-10-CM | POA: Diagnosis not present

## 2015-05-22 DIAGNOSIS — Z79899 Other long term (current) drug therapy: Secondary | ICD-10-CM | POA: Diagnosis not present

## 2015-05-22 DIAGNOSIS — D72819 Decreased white blood cell count, unspecified: Secondary | ICD-10-CM

## 2015-05-22 DIAGNOSIS — D759 Disease of blood and blood-forming organs, unspecified: Secondary | ICD-10-CM

## 2015-05-22 DIAGNOSIS — R1310 Dysphagia, unspecified: Secondary | ICD-10-CM

## 2015-05-22 DIAGNOSIS — D473 Essential (hemorrhagic) thrombocythemia: Secondary | ICD-10-CM

## 2015-05-22 LAB — CBC WITH DIFFERENTIAL/PLATELET
BASOS ABS: 0 10*3/uL (ref 0.0–0.1)
Basophils Relative: 0 %
Eosinophils Absolute: 0 10*3/uL (ref 0.0–0.7)
Eosinophils Relative: 0 %
HCT: 31.3 % — ABNORMAL LOW (ref 36.0–46.0)
HEMOGLOBIN: 10.4 g/dL — AB (ref 12.0–15.0)
LYMPHS PCT: 27 %
Lymphs Abs: 0.8 10*3/uL (ref 0.7–4.0)
MCH: 39.2 pg — ABNORMAL HIGH (ref 26.0–34.0)
MCHC: 33.2 g/dL (ref 30.0–36.0)
MCV: 118.1 fL — ABNORMAL HIGH (ref 78.0–100.0)
MONOS PCT: 21 %
Monocytes Absolute: 0.6 10*3/uL (ref 0.1–1.0)
NEUTROS PCT: 52 %
Neutro Abs: 1.5 10*3/uL — ABNORMAL LOW (ref 1.7–7.7)
Platelets: 415 10*3/uL — ABNORMAL HIGH (ref 150–400)
RBC: 2.65 MIL/uL — AB (ref 3.87–5.11)
RDW: 14.6 % (ref 11.5–15.5)
WBC: 2.9 10*3/uL — AB (ref 4.0–10.5)

## 2015-05-22 LAB — SAVE SMEAR

## 2015-05-22 MED ORDER — HYDROXYUREA 500 MG PO CAPS: ORAL_CAPSULE | ORAL | Status: DC

## 2015-05-22 NOTE — Patient Instructions (Signed)
Lab every 2 months at [place] and fax to [facility] clinic [phone] on 7/11, 9/12, 11/14,17 MD visit 4 months

## 2015-05-22 NOTE — Progress Notes (Signed)
Patient ID: Jill Myers, female   DOB: 03-30-1930, 82 y.o.   MRN: [id number] Hematology and Oncology Follow Up Visit  Jill Myers [id number] 12/06/1930 82 y.o. 05/22/2015 11:17 AM   Principle Diagnosis: Encounter Diagnoses  Name Primary?  . Disease of blood and blood forming organ Yes  . Deglutition disorder   . Essential thrombocythemia [facility])   Clinical Summary: Pleasant 82 year old woman diagnosed with essential thrombocythemia in April 2005. Blood counts are well controlled on low-dose Hydrea 1000 mg daily and aspirin 81 mg daily. She has never had any thrombotic events. Hydrea controls her counts at the expense of a mild chronic leukopenia and anemia.  Interim History:   She has had no interim medical problems. She denies any headache or change in vision, no slurred speech, no focal weakness, no paresthesias. No chest pain or palpitations. No dyspnea. No change in bowel habit. She uses a laxative on a regular basis.  Medications: reviewed  Allergies:  Allergies  Allergen Reactions  . Sulfamethoxazole-Trimethoprim Rash    REACTION: Rash after completing course of Septra    Review of Systems: See interim history  Her gabapentin dose as been increased to 500 mg 4 times a day to control symptoms of restless legs. This does seem to be helping. Remaining ROS negative:   Physical Exam: Blood pressure 147/59, pulse 78, temperature 98.4 F (36.9 C), temperature source Oral, height 5\' 3"  (1.6 m), weight 137 lb 9.6 oz (62.415 kg), SpO2 95 %. Wt Readings from Last 3 Encounters:  05/22/15 137 lb 9.6 oz (62.415 kg)  05/10/15 138 lb (62.596 kg)  04/16/15 128 lb (58.06 kg)     General appearance: [name] HENNT: Pharynx no erythema, exudate, mass, or ulcer. No thyromegaly or thyroid nodules Lymph nodes: No cervical, supraclavicular, or axillary lymphadenopathy Breasts:  Lungs: Clear to auscultation, resonant to percussion throughout Heart: Regular rhythm, no murmur, no  gallop, no rub, no click, no edema Abdomen: Soft, nontender, normal bowel sounds, no mass, no organomegaly Extremities: No edema, no calf tenderness Musculoskeletal: no joint deformities GU:  Vascular: Carotid pulses 2+, no bruits, distal pulses:  Neurologic: Alert, oriented, PERRLA, optic discs sharp and vessels normal, no hemorrhage or exudate, cranial nerves grossly normal, motor strength 5 over 5, reflexes 1+ symmetric, upper body coordination normal, gait normal, Skin: No rash or ecchymosis  Lab Results: CBC W/Diff    Component Value Date/Time   WBC 2.9* 05/22/2015 0950   WBC 2.9 01/23/2015   WBC 3.2* 12/18/2014 1342   RBC 2.65* 05/22/2015 0950   RBC 2.87* 12/18/2014 1342   HGB 10.4* 05/22/2015 0950   HGB 11.7 12/18/2014 1342   HCT 31.3* 05/22/2015 0950   HCT 34.7* 12/18/2014 1342   PLT 415* 05/22/2015 0950   PLT 340 12/18/2014 1342   MCV 118.1* 05/22/2015 0950   MCV 120.9* 12/18/2014 1342   MCH 39.2* 05/22/2015 0950   MCH 40.8* 12/18/2014 1342   MCHC 33.2 05/22/2015 0950   MCHC 33.7 12/18/2014 1342   RDW 14.6 05/22/2015 0950   RDW 13.8 12/18/2014 1342   LYMPHSABS 0.8 05/22/2015 0950   LYMPHSABS 1.3 12/18/2014 1342   MONOABS 0.6 05/22/2015 0950   MONOABS 0.4 12/18/2014 1342   EOSABS 0.0 05/22/2015 0950   EOSABS 0.0 12/18/2014 1342   BASOSABS 0.0 05/22/2015 0950   BASOSABS 0.0 12/18/2014 1342     Chemistry      Component Value Date/Time   NA 141 01/23/2015   NA 138 10/16/2014 1404  NA 137 05/15/2011 1326   K 4.0 01/23/2015   K 3.4* 10/16/2014 1404   CL 100 02/13/2012 1344   CL 101 05/15/2011 1326   CO2 28 10/16/2014 1404   CO2 26 05/15/2011 1326   BUN 14 01/23/2015   BUN 15.2 10/16/2014 1404   BUN 20 05/15/2011 1326   CREATININE 0.8 01/23/2015   CREATININE 0.9 10/16/2014 1404   CREATININE 0.93 05/15/2011 1326   CREATININE 0.77 08/16/2010 0935   GLU 85 01/23/2015      Component Value Date/Time   CALCIUM 9.0 10/16/2014 1404   CALCIUM 9.1 05/15/2011  1326   ALKPHOS 46 01/23/2015   ALKPHOS 48 10/16/2014 1404   AST 19 01/23/2015   AST 27 10/16/2014 1404   ALT 23 01/23/2015   ALT 34 10/16/2014 1404   BILITOT 0.44 10/16/2014 1404   BILITOT 0.4 05/15/2011 1326    Lab today 05/22/2015: Hemoglobin 10.4, hematocrit 31, MCV 118, white count 2900 with 52% neutrophils, 27 lymphocytes, 21 monocytes, and platelet count 415,000.    Radiological Studies: No results found.  Impression:  Essential thrombocythemia  She has now been on Hydrea for 12 years. Hemoglobin lower than her baseline today. Increased monocytes on differential. I will review the peripheral blood film. No immediate concern for leukemic transformation as platelet count remains stable. I will continue current dose of Hydrea for now. Explore other potential etiologies for fall in her hemoglobin. Last colonoscopy done in 2008. She is now 82 years old. No reported change in bowel habit at this time.  #2. Chronic leukopenia, macrocytic anemia, secondary to chronic Hydrea use.  #3. Essential hypertension  #4. Osteoporosis   DEXA scan done 11/20/2014 not surprisingly shows persistent osteopenia. She continues on vitamin D and calcium supplements.  #4. Diverticulosis: no current GI symptoms  #5. Restless leg syndrome.  #5. Ligamentous laxity of the pelvis  CC: Patient Care Team: Estill Dooms, MD as PCP - General (Internal Medicine) Annia Belt, MD as Consulting Physician (Oncology) [name], MD as Consulting Physician (Dermatology) [name], MD as Consulting Physician (Sports Medicine) [name], MD as Consulting Physician (Family Medicine)   Annia Belt, MD 5/16/201711:17 AM

## 2015-05-22 NOTE — Addendum Note (Signed)
Addended by: Truddie Crumble on: 05/22/2015 11:43 AM   Modules accepted: Orders

## 2015-05-23 LAB — IRON AND TIBC
IRON SATURATION: 37 % (ref 15–55)
IRON: 90 ug/dL (ref 27–139)
Total Iron Binding Capacity: 245 ug/dL — ABNORMAL LOW (ref 250–450)
UIBC: 155 ug/dL (ref 118–369)

## 2015-05-23 LAB — FERRITIN: FERRITIN: 164 ng/mL — AB (ref 15–150)

## 2015-05-24 ENCOUNTER — Encounter: Payer: Self-pay | Admitting: Nurse Practitioner

## 2015-06-06 ENCOUNTER — Ambulatory Visit (INDEPENDENT_AMBULATORY_CARE_PROVIDER_SITE_OTHER): Payer: Medicare Other | Admitting: Sports Medicine

## 2015-06-06 VITALS — BP 116/59 | HR 77 | Ht 63.0 in | Wt 137.0 lb

## 2015-06-06 DIAGNOSIS — G5763 Lesion of plantar nerve, bilateral lower limbs: Secondary | ICD-10-CM

## 2015-06-06 NOTE — Assessment & Plan Note (Signed)
Patient was fitted for a : standard, cushioned, semi-rigid orthotic. The orthotic was heated and afterward the patient stood on the orthotic blank positioned on the orthotic stand. The patient was positioned in subtalar neutral position and 10 degrees of ankle dorsiflexion in a weight bearing stance. After completion of molding, a stable base was applied to the orthotic blank. The blank was ground to a stable position for weight bearing. Size: 7 EVA blank with forefoot cushion Base: Med blue EVA Posting: left off MT pads for now Additional orthotic padding: prep - none  After completion of orthotic she had good relief of symptoms. I want her to test these over the next month. If she needs it we will replace the metatarsal pads. At first will try to see if the cushion helps. Walking gait was neutral with no pronation after completion of the orthotics.   I spent 35 minutes face-to-face for preparation orthotics and for counseling on use of correct shoe wear and orthotic placement.

## 2015-06-06 NOTE — Progress Notes (Signed)
Patient ID: Jill Myers, female   DOB: 01-19-1930, 82 y.o.   MRN: [id number]  Patient is returning today for chronic metatarsalgia, neuromas and forefoot pain She has been in orthotics for years She cannot walk without the orthotics because of forefoot pain  With the older orthotics we made her she still was unable to do more than short distance walking over the past 6 months She used to walk regularly for exercise We ordered her a special type of orthotic with greater forefoot padding to see if we can help her get back into a walking program  She is trying to stay fit at her age and is doing balance and yoga classes  Past history: reviewed her multiple medical problems. She seems stable and her essential thrombocythemia has stayed in good control  ROS No swelling of the feet No generalized joint pain  Physical examination Pleasant older female in no acute distress BP 116/59 mmHg  Pulse 77  Ht 5\' 3"  (1.6 m)  Wt 137 lb (62.143 kg)  BMI 24.27 kg/m2  Bilateral feet show loss of transverse arch Tenderness to palpation in the forefoot bilaterally Burning sensation on squeeze of the feet Mild loss of the longitudinal arch Mild pronation with walking

## 2015-07-17 ENCOUNTER — Telehealth: Payer: Self-pay | Admitting: *Deleted

## 2015-07-17 NOTE — Telephone Encounter (Signed)
Fax from [facility] [place] - Lab results: Hgb 11.4,Hematocrit 32.8,  Platelets 377, WBC 3.1, RBC 2.8.  Copy of fax placed in Dr [facility] Inn[facility] mailbox [facility]. Thanks

## 2015-07-17 NOTE — Telephone Encounter (Signed)
I reviewed Dr. Synthia Innocent last note.  These labs seem improved from previous and her platelets are improved from around 400.  I would also forward these to Dr. Gaetano Hawthorne office for review, He is her PCP.    Thanks.

## 2015-07-21 ENCOUNTER — Other Ambulatory Visit: Payer: Self-pay | Admitting: Nurse Practitioner

## 2015-08-18 ENCOUNTER — Other Ambulatory Visit: Payer: Self-pay | Admitting: Internal Medicine

## 2015-09-04 LAB — TSH: TSH: 3.61 u[IU]/mL (ref 0.41–5.90)

## 2015-09-04 LAB — HEPATIC FUNCTION PANEL
ALK PHOS: 39 U/L (ref 25–125)
ALT: 26 U/L (ref 7–35)
AST: 20 U/L (ref 13–35)
BILIRUBIN, TOTAL: 0.5 mg/dL

## 2015-09-04 LAB — CBC AND DIFFERENTIAL
HCT: 35 % — AB (ref 36–46)
Hemoglobin: 11.9 g/dL — AB (ref 12.0–16.0)
PLATELETS: 407 10*3/uL — AB (ref 150–399)
WBC: 3.2 10*3/mL

## 2015-09-04 LAB — BASIC METABOLIC PANEL
BUN: 14 mg/dL (ref 4–21)
CREATININE: 0.8 mg/dL (ref 0.5–1.1)
GLUCOSE: 86 mg/dL
Potassium: 4 mmol/L (ref 3.4–5.3)
Sodium: 138 mmol/L (ref 137–147)

## 2015-09-05 ENCOUNTER — Encounter: Payer: Self-pay | Admitting: *Deleted

## 2015-09-13 ENCOUNTER — Encounter: Payer: Self-pay | Admitting: Nurse Practitioner

## 2015-09-13 ENCOUNTER — Non-Acute Institutional Stay: Payer: Medicare Other | Admitting: Nurse Practitioner

## 2015-09-13 DIAGNOSIS — M7741 Metatarsalgia, right foot: Secondary | ICD-10-CM

## 2015-09-13 DIAGNOSIS — K219 Gastro-esophageal reflux disease without esophagitis: Secondary | ICD-10-CM | POA: Diagnosis not present

## 2015-09-13 DIAGNOSIS — N3941 Urge incontinence: Secondary | ICD-10-CM | POA: Diagnosis not present

## 2015-09-13 DIAGNOSIS — D473 Essential (hemorrhagic) thrombocythemia: Secondary | ICD-10-CM

## 2015-09-13 DIAGNOSIS — G4700 Insomnia, unspecified: Secondary | ICD-10-CM | POA: Diagnosis not present

## 2015-09-13 DIAGNOSIS — G2581 Restless legs syndrome: Secondary | ICD-10-CM | POA: Diagnosis not present

## 2015-09-13 DIAGNOSIS — I10 Essential (primary) hypertension: Secondary | ICD-10-CM

## 2015-09-13 NOTE — Assessment & Plan Note (Signed)
Some trouble staying asleep, failed lorazepam, Mirtazapine 7.5mg  too sedating, will try 1/2 7.5mg  nightly.

## 2015-09-13 NOTE — Assessment & Plan Note (Signed)
Stable, continue Omeprazole 20mg daily.  

## 2015-09-13 NOTE — Assessment & Plan Note (Signed)
F/u oncology Continue Hydroxyurea

## 2015-09-13 NOTE — Assessment & Plan Note (Signed)
Well controlled,  on HCTZ, no changes at this time. 09/04/15 Na 138, K 4.0, Bun 14, creat 0.8

## 2015-09-13 NOTE — Progress Notes (Signed)
Patient ID: Jill Myers, female   DOB: [birth year], 82 y.o.   MRN: [id number]   Location:   [facility]   Place of Service:  Clinic (12)clinic [facility]  Provider: Marlana Latus NP  Code Status: DNR Goals of Care:  Advanced Directives 06/06/2015  Does patient have an advance directive? Yes  Type of Paramedic of [place];Living will  Does patient want to make changes to advanced directive? -  Copy of advanced directive(s) in chart? -  Would patient like information on creating an advanced directive? -  Some recent data might be hidden     Chief Complaint  Patient presents with  . Medical Management of Chronic Issues    4 mo F/U w/labs    HPI: Patient is a 82 y.o. female seen today for chronic medical conditions. Hx of restless legs/peripheral neuropathy, managed with Neurontin 300mg  am and 900mg  nightly. Her blood pressure is controlled on HCTZ 25mg . 09/04/15 Na 138, K 4.0, Bun 14, creat 0.8, Hgb 11.9  Urinary frequency is stable, on Vesicare nightly. Persisted insomnia, failed Lorazepam, Mirtazapine 7.5mg  too sedating.   Past Medical History:  Diagnosis Date  . Bladder cystocele 05/21/2010  . Deglutition disorder 08/17/2012   Single episode of choking/aspiration; preceded by trouble swallowing saliva (thin liquids).  For SLP evaluation. Aug 17, 2012.    [name] Disease of blood and blood forming organ 01/05/2007   Qualifier: Diagnosis of  By: Oneida Alar MD, KARL    . Diverticulitis   . DIVERTICULOSIS OF COLON 03/05/2006   Qualifier: Diagnosis of  By: Samara Snide    . DIZZINESS 01/18/2010   Qualifier: Diagnosis of  By: Lindell Noe MD, Jeneen Rinks    . Essential hypertension, benign 02/22/2008   Qualifier: Diagnosis of  By: Lindell Noe MD, Jeneen Rinks     . Essential thrombocythemia ([place]) 01/31/11  . Family history of Alzheimer's disease 11/11/2013   Patient with family history of Alzheimers disease. Jill Myers herself is very highly functional.  Plans for future visit with MMSE as primary function of the visit.      [name] GERD (gastroesophageal reflux disease) 08/16/2010  . HALLUX VALGUS, ACQUIRED 04/10/2009   Qualifier: Diagnosis of  By: [name] CMA,, [place]    . HYPERTRIGLYCERIDEMIA 01/05/2007   Qualifier: Diagnosis of  By: Oneida Alar MD, KARL    . Insomnia 02/10/2014  . Left-sided chest wall pain 12/19/2011    L sided chest wall pain follows a dermatomal distribution and raises the possibility of thoracic radiculopathic pain, which would be expected to improve with gabapentin as well. We discussed this at length and Jill Myers will call to make me aware if this does not get better. Rib belt/girdle in the meantime; may consider rib films or CXR if not improving.     . Metatarsalgia of right foot 11/28/2013   Referral to [facility]   . Myeloma ([place])    prolefica  . Osteopenia   . Restless leg syndrome 06/09/2011  . Restless legs syndrome (RLS)   . URINARY INCONTINENCE, URGE, MILD 11/27/2009   Qualifier: Diagnosis of  By: Zebedee Iba NP, Manuela Schwartz    . Vitamin D deficiency 08/16/2010    Past Surgical History:  Procedure Laterality Date  . BIOPSY BREAST Left 1998   Dr. Rebekah Chesterfield  . CATARACT EXTRACTION  2010   Dr. [name]  . CHOLECYSTECTOMY  1992  . COLONOSCOPY  2008  . MOHS SURGERY  2012   on face [facility] [place]  . NAILBED REPAIR  12/05/2010   Procedure: NAILBED REPAIR;  Surgeon: Cammie Sickle., MD;  Location: [facility];  Service: Orthopedics;  Laterality: Right;  full thickness nail biopsy right hallux  . torn tenden  2012   hand Dr. Orie Rout    Allergies  Allergen Reactions  . Macrodantin [Nitrofurantoin] Nausea And Vomiting  . Sulfamethoxazole-Trimethoprim Rash    REACTION: Rash after completing course of Septra      Medication List       Accurate as of 09/13/15  4:39 PM. Always use your most recent med list.          aspirin 81 MG tablet Take 81 mg by mouth daily.   cholecalciferol 1000 units tablet Commonly known as:  VITAMIN D Take 1,000 Units by mouth daily.   docusate  sodium 250 MG capsule Commonly known as:  COLACE Take 250 mg by mouth daily.   fish oil-omega-3 fatty acids 1000 MG capsule Take 1 g by mouth daily.   fluticasone 50 MCG/ACT nasal spray Commonly known as:  FLONASE SPRAY EACH NOSTRIL TWICE A DAY   gabapentin 300 MG capsule Commonly known as:  NEURONTIN TAKE ONE CAPSULE BY MOUTH EVERY MORNING 2 CAPS AT NOON AND 3 CAPS AT EVENING   Glucosamine-Chondroitin 750-600 MG Tabs Take 1 tablet by mouth 2 (two) times daily.   hydrochlorothiazide 25 MG tablet Commonly known as:  HYDRODIURIL TAKE 1 TABLET BY MOUTH DAILY   hydroxyurea 500 MG capsule Commonly known as:  HYDREA TAKE 2 CAPSULES (1,000 MG TOTAL) BY MOUTH DAILY. MAY TAKE WITH FOOD TO MINIMIZE GI SIDE EFFECTS.   ibuprofen 200 MG tablet Commonly known as:  ADVIL,MOTRIN Take 200 mg by mouth as needed.   LORazepam 0.5 MG tablet Commonly known as:  ATIVAN TAKE 1 TABLET BY MOUTH AT BEDTIME AS NEEDED   nitrofurantoin 100 MG capsule Commonly known as:  MACRODANTIN TAKE ONE CAPSULE BY MOUTH EVERY 12 HOURS   omeprazole 20 MG capsule Commonly known as:  PRILOSEC Take 20 mg by mouth daily.   polyethylene glycol packet Commonly known as:  MIRALAX / GLYCOLAX Take 17 g by mouth daily.   pyridOXINE 100 MG tablet Commonly known as:  VITAMIN B-6 Take 100 mg by mouth daily.   VESICARE PO Take by mouth.   VESICARE 5 MG tablet Generic drug:  solifenacin Take by mouth daily.       Review of Systems:  Review of Systems  Constitutional: Negative for chills, diaphoresis and fever.  HENT: Negative for congestion, ear discharge, ear pain, hearing loss, sore throat and tinnitus.   Eyes: Negative for pain and redness.  Respiratory: Negative for cough, shortness of breath and wheezing.   Cardiovascular: Negative for chest pain, palpitations and leg swelling.  Gastrointestinal: Negative for abdominal pain, constipation, diarrhea and nausea.       Some heartburn. Using times  regularly. Has [place] esophageal dysphagia. Food sticks in chest. Generally this is solids.  Endocrine: Negative for polydipsia.  Genitourinary: Negative for dysuria, flank pain, frequency, hematuria and urgency.       Intermittent incontinence. Has not seen urologist. History of cystocele.  Musculoskeletal: Positive for arthralgias. Negative for back pain, myalgias and neck pain.       History of osteopenia. Last bone density 2013.  Skin: Negative for rash.       Split in the right toenail. Previous removal of this toenail and irregular regrowth.  Neurological: Positive for dizziness (Loss of balance with turning or walking fast.). Negative for tremors, seizures, weakness and headaches.  Her son would like her to have a neurology appointment for episodes of dizziness. Patient is reluctant to do this at this time Pain feet, restless legs, takes Neurontin  Hematological: Does not bruise/bleed easily.       Essential thrombocythemia under treatment with Hydrea  Psychiatric/Behavioral: Positive for sleep disturbance. Negative for hallucinations and suicidal ideas. The patient is not nervous/anxious.     Health Maintenance  Topic Date Due  . INFLUENZA VACCINE  08/07/2015  . TETANUS/TDAP  04/11/2019  . DEXA SCAN  Completed  . ZOSTAVAX  Completed  . PNA vac Low Risk Adult  Completed    Physical Exam: Vitals:   09/13/15 1612  BP: 130/78  Pulse: 74  Resp: 18  Temp: 98.4 F (36.9 C)  TempSrc: Oral  SpO2: 94%  Weight: 138 lb (62.6 kg)  Height: 5\' 4"  (1.626 m)   Body mass index is 23.69 kg/m. Physical Exam  Constitutional: Jill Myers is oriented to person, place, and time. Jill Myers appears well-developed and well-nourished. No distress.  HENT:  Right Ear: External ear normal.  Left Ear: External ear normal.  Nose: Nose normal.  Mouth/Throat: Oropharynx is clear and moist. No oropharyngeal exudate.  Eyes: Conjunctivae and EOM are normal. Pupils are equal, round, and reactive to light. No  scleral icterus.  Neck: No JVD present. No tracheal deviation present. No thyromegaly present.  Cardiovascular: Normal rate, regular rhythm and intact distal pulses.  Exam reveals no gallop and no friction rub.   Murmur heard. 2-3 systolic murmurs  Pulmonary/Chest: Effort normal. No respiratory distress. Jill Myers has no wheezes. Jill Myers has no rales. Jill Myers exhibits no tenderness.  Abdominal: Jill Myers exhibits no distension and no mass. There is no tenderness.  Genitourinary:  Genitourinary Comments: Patient prefers female physician for pelvic exams.  Musculoskeletal: Normal range of motion. Jill Myers exhibits edema and tenderness.  Chronic right foot pain. Trace edema in ankles.   Lymphadenopathy:    Jill Myers has no cervical adenopathy.  Neurological: Jill Myers is alert and oriented to person, place, and time. No cranial nerve deficit. Coordination normal.  Skin: No rash noted. Jill Myers is not diaphoretic. No erythema. No pallor.  Split toenail right great toe.  Psychiatric: Jill Myers has a normal mood and affect. Her behavior is normal. Judgment and thought content normal.    Labs reviewed: Basic Metabolic Panel:  Recent Labs  10/16/14 1404 01/23/15 09/04/15  NA 138 141 138  K 3.4* 4.0 4.0  CO2 28  --   --   GLUCOSE 93  --   --   BUN 15.2 14 14   CREATININE 0.9 0.8 0.8  CALCIUM 9.0  --   --   TSH  --  4.22 3.61   Liver Function Tests:  Recent Labs  10/16/14 1404 01/23/15 09/04/15  AST 27 19 20   ALT 34 23 26  ALKPHOS 48 46 39  BILITOT 0.44  --   --   PROT 6.7  --   --   ALBUMIN 4.1  --   --    No results for input(s): LIPASE, AMYLASE in the last 8760 hours. No results for input(s): AMMONIA in the last 8760 hours. CBC:  Recent Labs  10/16/14 1404 12/18/14 1342 01/23/15 05/22/15 0950 09/04/15  WBC 3.2* 3.2* 2.9 2.9* 3.2  NEUTROABS 1.6 1.5 3 1.5*  --   HGB 11.4* 11.7 12.8 10.4* 11.9*  HCT 33.8* 34.7* 37 31.3* 35*  MCV 118.8* 120.9*  --  118.1*  --   PLT 404* 340 407* 415* 407*  Lipid Panel:  Recent  Labs  01/23/15  CHOL 128  HDL 51  LDLCALC 56  TRIG 104   No results found for: HGBA1C  Procedures since last visit: No results found.  Assessment/Plan Essential hypertension, benign Well controlled,  on HCTZ, no changes at this time. 09/04/15 Na 138, K 4.0, Bun 14, creat 0.8   GERD (gastroesophageal reflux disease) Stable, continue Omeprazole 20mg  daily  Essential thrombocythemia ([facility]) F/u oncology Continue Hydroxyurea  URINARY INCONTINENCE, URGE, MILD Continue Vesicare  Restless leg syndrome Feet pain, restless legs, continue gabapentin 300mg  am and 900mg  pm   Insomnia Some trouble staying asleep, failed lorazepam, Mirtazapine 7.5mg  too sedating, will try 1/2 7.5mg  nightly.    Metatarsalgia of right foot S/p Ortho, takes Ibuprofen prn    Labs/tests ordered:  none  Next appt:  3 months

## 2015-09-13 NOTE — Assessment & Plan Note (Signed)
S/p Ortho, takes Ibuprofen prn

## 2015-09-13 NOTE — Assessment & Plan Note (Signed)
Feet pain, restless legs, continue gabapentin 300mg  am and 900mg  pm

## 2015-09-13 NOTE — Assessment & Plan Note (Signed)
Continue Vesicare 

## 2015-09-15 ENCOUNTER — Other Ambulatory Visit: Payer: Self-pay | Admitting: Internal Medicine

## 2015-09-26 ENCOUNTER — Telehealth: Payer: Self-pay | Admitting: *Deleted

## 2015-09-26 NOTE — Telephone Encounter (Signed)
Pt called and informed to "stay on current dose of Hydrea" per Dr Beryle Beams. Dr Darnell Level had received results from [facility] collected on 09/18/15. Copy of results to be scanned in to pt's chart.

## 2015-10-15 ENCOUNTER — Ambulatory Visit (INDEPENDENT_AMBULATORY_CARE_PROVIDER_SITE_OTHER): Payer: Medicare Other | Admitting: Oncology

## 2015-10-15 ENCOUNTER — Encounter: Payer: Self-pay | Admitting: Oncology

## 2015-10-15 VITALS — BP 129/69 | HR 84 | Temp 97.9°F | Ht 63.0 in | Wt 137.2 lb

## 2015-10-15 DIAGNOSIS — Z7982 Long term (current) use of aspirin: Secondary | ICD-10-CM

## 2015-10-15 DIAGNOSIS — G4700 Insomnia, unspecified: Secondary | ICD-10-CM

## 2015-10-15 DIAGNOSIS — T451X5D Adverse effect of antineoplastic and immunosuppressive drugs, subsequent encounter: Secondary | ICD-10-CM | POA: Diagnosis not present

## 2015-10-15 DIAGNOSIS — D473 Essential (hemorrhagic) thrombocythemia: Secondary | ICD-10-CM

## 2015-10-15 DIAGNOSIS — Z79899 Other long term (current) drug therapy: Secondary | ICD-10-CM

## 2015-10-15 DIAGNOSIS — D539 Nutritional anemia, unspecified: Secondary | ICD-10-CM | POA: Diagnosis not present

## 2015-10-15 DIAGNOSIS — Z8719 Personal history of other diseases of the digestive system: Secondary | ICD-10-CM

## 2015-10-15 DIAGNOSIS — M8580 Other specified disorders of bone density and structure, unspecified site: Secondary | ICD-10-CM

## 2015-10-15 DIAGNOSIS — D701 Agranulocytosis secondary to cancer chemotherapy: Secondary | ICD-10-CM | POA: Diagnosis not present

## 2015-10-15 NOTE — Patient Instructions (Addendum)
Continue every other month CBC,diff at friend's home & fax results to me MD vist  With Dr Darnell Level in 6 months

## 2015-10-15 NOTE — Progress Notes (Signed)
Hematology and Oncology Follow Up Visit  Jill Myers [id number] 1930/01/10 82 y.o. 10/15/2015 6:06 PM   Principle Diagnosis:  Myeloproliferative disorder: Essential Thrombocythemia  Clinical summary: Pleasant 82 year old woman diagnosed with essential thrombocythemia in April 2005. Blood counts are well controlled on low-dose Hydrea 1000 mg daily and aspirin 81 mg daily. She has never had any thrombotic events. Hydrea controls her counts at the expense of a mild chronic leukopenia and anemia.  Interim History:   Overall doing well. Main complaint is insomnia. She has tried a number of anxiolytics including Ativan without much relief. She was recently put on a trial of Remeron 7.5 mg as needed at night. She is only taking half of a tablet. She is not sure that it is helping. She denies any headache, change in vision, focal weakness, dysarthria, paresthesias, dyspnea, chest pain or palpitations, change in bowel habit. She had recent urinary symptoms was found to have a UTI and was successfully treated with outpatient antibiotics.  Medications: reviewed  Allergies:  Allergies  Allergen Reactions  . Macrodantin [Nitrofurantoin] Nausea And Vomiting  . Sulfamethoxazole-Trimethoprim Rash    REACTION: Rash after completing course of Septra    Review of Systems: See interim history Remaining ROS negative:   Physical Exam: Blood pressure 129/69, pulse 84, temperature 97.9 F (36.6 C), temperature source Oral, height 5\' 3"  (1.6 m), weight 137 lb 3.2 oz (62.2 kg), SpO2 97 %. Wt Readings from Last 3 Encounters:  10/15/15 137 lb 3.2 oz (62.2 kg)  09/13/15 138 lb (62.6 kg)  06/06/15 137 lb (62.1 kg)     General appearance: Petite Caucasian woman HENNT: Pharynx no erythema, exudate, mass, or ulcer. No thyromegaly or thyroid nodules Lymph nodes: No cervical, supraclavicular, or axillary lymphadenopathy Breasts:  Lungs: Clear to auscultation, resonant to percussion throughout Heart: Regular  rhythm, no murmur, no gallop, no rub, no click, no edema Abdomen: Soft, nontender, normal bowel sounds, no mass, no organomegaly Extremities: No edema, no calf tenderness Musculoskeletal: no joint deformities GU:  Vascular: Carotid pulses 2+, no bruits,  Neurologic: Alert, oriented, PERRLA, optic discs sharp and vessels normal, no hemorrhage or exudate, cranial nerves grossly normal, motor strength 5 over 5, reflexes 1+ symmetric, upper body coordination normal, gait normal, Skin: No rash or ecchymosis  Lab Results: CBC W/Diff    Component Value Date/Time   WBC 3.2 09/04/2015   WBC 2.9 (L) 05/22/2015 0950   RBC 2.65 (L) 05/22/2015 0950   HGB 11.9 (A) 09/04/2015   HGB 11.7 12/18/2014 1342   HCT 35 (A) 09/04/2015   HCT 34.7 (L) 12/18/2014 1342   PLT 407 (A) 09/04/2015   PLT 340 12/18/2014 1342   MCV 118.1 (H) 05/22/2015 0950   MCV 120.9 (H) 12/18/2014 1342   MCH 39.2 (H) 05/22/2015 0950   MCHC 33.2 05/22/2015 0950   RDW 14.6 05/22/2015 0950   RDW 13.8 12/18/2014 1342   LYMPHSABS 0.8 05/22/2015 0950   LYMPHSABS 1.3 12/18/2014 1342   MONOABS 0.6 05/22/2015 0950   MONOABS 0.4 12/18/2014 1342   EOSABS 0.0 05/22/2015 0950   EOSABS 0.0 12/18/2014 1342   BASOSABS 0.0 05/22/2015 0950   BASOSABS 0.0 12/18/2014 1342     Chemistry      Component Value Date/Time   NA 138 09/04/2015   NA 138 10/16/2014 1404   K 4.0 09/04/2015   K 3.4 (L) 10/16/2014 1404   CL 100 02/13/2012 1344   CO2 28 10/16/2014 1404   BUN 14 09/04/2015   BUN  15.2 10/16/2014 1404   CREATININE 0.8 09/04/2015   CREATININE 0.9 10/16/2014 1404   GLU 86 09/04/2015      Component Value Date/Time   CALCIUM 9.0 10/16/2014 1404   ALKPHOS 39 09/04/2015   ALKPHOS 48 10/16/2014 1404   AST 20 09/04/2015   AST 27 10/16/2014 1404   ALT 26 09/04/2015   ALT 34 10/16/2014 1404   BILITOT 0.44 10/16/2014 1404    [facility]cent CBC done on 09/18/2015: Hemoglobin, MCV 117, white count 3900, 47 neutrophils, 38 lymphocytes, 14  monocytes platelets 414,000.   Radiological Studies: No results found.  Impression:  #1. Myeloproliferative disorder: Essential thrombocythemia Hemoglobin on most recent CBC now back to her baseline. I have no good explanation for the fall in the hemoglobin recorded back in May. Monocyte percentage also back in the high normal range significantly decreased from the 21% on the May study. I will continue her Hydrea at the same dose and every 2 month lab checks.  #2. Chronic leukopenia, macrocytic anemia, secondary to chronic Hydrea use.  #3. Essential hypertension  #4. Osteoporosis   DEXA scan done 11/20/2014 not surprisingly shows persistent osteopenia. She continues on vitamin D and calcium supplements.  #4. Diverticulosis: no current GI symptoms  #5. Restless leg syndrome.  #5. Ligamentous laxity of the pelvis  CC: Patient Care Team: Man Otho Darner, NP as PCP - General (Internal Medicine) Annia Belt, MD as Consulting Physician (Oncology) [name], MD as Consulting Physician (Dermatology) [name], MD as Consulting Physician (Sports Medicine) [name], MD (Inactive) as Consulting Physician (Family Medicine)   Annia Belt, MD 10/9/20176:06 PM

## 2015-11-20 ENCOUNTER — Telehealth: Payer: Self-pay | Admitting: *Deleted

## 2015-11-20 NOTE — Telephone Encounter (Signed)
Received las results from [place]. Called pt - no one answered.  Per Dr Darnell Level - left message "stay on current dose of Hydrea". And to call for any questions. Copy of labs to be scanned.

## 2015-12-20 ENCOUNTER — Non-Acute Institutional Stay: Payer: Medicare Other | Admitting: Nurse Practitioner

## 2015-12-20 ENCOUNTER — Encounter: Payer: Self-pay | Admitting: Nurse Practitioner

## 2015-12-20 DIAGNOSIS — I10 Essential (primary) hypertension: Secondary | ICD-10-CM

## 2015-12-20 DIAGNOSIS — G4700 Insomnia, unspecified: Secondary | ICD-10-CM

## 2015-12-20 DIAGNOSIS — N3941 Urge incontinence: Secondary | ICD-10-CM | POA: Diagnosis not present

## 2015-12-20 DIAGNOSIS — D473 Essential (hemorrhagic) thrombocythemia: Secondary | ICD-10-CM | POA: Diagnosis not present

## 2015-12-20 DIAGNOSIS — G2581 Restless legs syndrome: Secondary | ICD-10-CM | POA: Diagnosis not present

## 2015-12-20 MED ORDER — ZOLPIDEM TARTRATE 5 MG PO TABS: 5.0000 mg | ORAL_TABLET | Freq: Every evening | ORAL | 1 refills | Status: DC | PRN

## 2015-12-20 NOTE — Assessment & Plan Note (Signed)
Well controlled,  on HCTZ, no changes at this time. 09/04/15 Na 138, K 4.0, Bun 14, creat 0.8

## 2015-12-20 NOTE — Assessment & Plan Note (Signed)
Some trouble staying asleep, Benadryl, Lorazepam, Mirtazapine failed. Will try Ambien 5mg .

## 2015-12-20 NOTE — Progress Notes (Signed)
Patient ID: Jill Myers, female   DOB: 1930-12-22, 82 y.o.   MRN: [id number]   Location:   [facility]   Place of Service:   clinic [place]  Provider: Marlana Latus NP  Code Status: DNR Goals of Care:  Advanced Directives 12/20/2015  Does Patient Have a Medical Advance Directive? Yes  Type of Advance Directive Living will;Healthcare Power of Attorney  Does patient want to make changes to medical advance directive? No - Patient declined  Copy of [place] in Chart? Yes  Would patient like information on creating a medical advance directive? -  Some recent data might be hidden     Chief Complaint  Patient presents with  . Medical Management of Chronic Issues    3 mo f/u    HPI: Patient is a 82 y.o. female seen today for chronic medical conditions.   Hx of restless legs/peripheral neuropathy, managed with Neurontin 300mg  am and 900mg  nightly. Her blood pressure is controlled on HCTZ 25mg . 09/04/15 Na 138, K 4.0, Bun 14, creat 0.8, Hgb 11.9  Urinary frequency is stable, on Vesicare nightly. Persisted insomnia, 0.5mg  Lorazepam qhs  Past Medical History:  Diagnosis Date  . Bladder cystocele 05/21/2010  . Deglutition disorder 08/17/2012   Single episode of choking/aspiration; preceded by trouble swallowing saliva (thin liquids).  For SLP evaluation. Aug 17, 2012.    [name] Disease of blood and blood forming organ 01/05/2007   Qualifier: Diagnosis of  By: Oneida Alar MD, KARL    . Diverticulitis   . DIVERTICULOSIS OF COLON 03/05/2006   Qualifier: Diagnosis of  By: Samara Snide    . DIZZINESS 01/18/2010   Qualifier: Diagnosis of  By: Lindell Noe MD, Jeneen Rinks    . Essential hypertension, benign 02/22/2008   Qualifier: Diagnosis of  By: Lindell Noe MD, Jeneen Rinks     . Essential thrombocythemia ([place]) 01/31/11  . Family history of Alzheimer's disease 11/11/2013   Patient with family history of Alzheimers disease. She herself is very highly functional.  Plans for future visit with MMSE as primary function of the  visit.    [name] GERD (gastroesophageal reflux disease) 08/16/2010  . HALLUX VALGUS, ACQUIRED 04/10/2009   Qualifier: Diagnosis of  By: [name] CMA,, Thekla    . HYPERTRIGLYCERIDEMIA 01/05/2007   Qualifier: Diagnosis of  By: Oneida Alar MD, KARL    . Insomnia 02/10/2014  . Left-sided chest wall pain 12/19/2011    L sided chest wall pain follows a dermatomal distribution and raises the possibility of thoracic radiculopathic pain, which would be expected to improve with gabapentin as well. We discussed this at length and she will call to make me aware if this does not get better. Rib belt/girdle in the meantime; may consider rib films or CXR if not improving.     . Metatarsalgia of right foot 11/28/2013   Referral to [facility]   . Myeloma ([place])    prolefica  . Osteopenia   . Restless leg syndrome 06/09/2011  . Restless legs syndrome (RLS)   . URINARY INCONTINENCE, URGE, MILD 11/27/2009   Qualifier: Diagnosis of  By: Zebedee Iba NP, Manuela Schwartz    . Vitamin D deficiency 08/16/2010    Past Surgical History:  Procedure Laterality Date  . BIOPSY BREAST Left 1998   Dr. Rebekah Chesterfield  . CATARACT EXTRACTION  2010   Dr. [name]  . CHOLECYSTECTOMY  1992  . COLONOSCOPY  2008  . MOHS SURGERY  2012   on face [facility]  . NAILBED REPAIR  12/05/2010  Procedure: NAILBED REPAIR;  Surgeon: Cammie Sickle., MD;  Location: [place];  Service: Orthopedics;  Laterality: Right;  full thickness nail biopsy right hallux  . torn tenden  2012   hand Dr. Orie Rout    Allergies  Allergen Reactions  . Macrodantin [Nitrofurantoin] Nausea And Vomiting  . Sulfamethoxazole-Trimethoprim Rash    REACTION: Rash after completing course of Septra    Allergies as of 12/20/2015      Reactions   Macrodantin [nitrofurantoin] Nausea And Vomiting   Sulfamethoxazole-trimethoprim Rash   REACTION: Rash after completing course of Septra      Medication List       Accurate as of 12/20/15  4:01 PM. Always use your most  recent med list.          aspirin 81 MG tablet Take 81 mg by mouth daily.   cholecalciferol 1000 units tablet Commonly known as:  VITAMIN D Take 1,000 Units by mouth daily.   docusate sodium 250 MG capsule Commonly known as:  COLACE Take 250 mg by mouth daily.   fish oil-omega-3 fatty acids 1000 MG capsule Take 1 g by mouth daily.   gabapentin 300 MG capsule Commonly known as:  NEURONTIN TAKE ONE CAPSULE BY MOUTH EVERY MORNING 2 CAPS AT NOON AND 3 CAPS AT EVENING   Glucosamine-Chondroitin 750-600 MG Tabs Take 1 tablet by mouth 2 (two) times daily.   hydrochlorothiazide 25 MG tablet Commonly known as:  HYDRODIURIL TAKE 1 TABLET BY MOUTH DAILY   hydroxyurea 500 MG capsule Commonly known as:  HYDREA TAKE 2 CAPSULES (1,000 MG TOTAL) BY MOUTH DAILY. MAY TAKE WITH FOOD TO MINIMIZE GI SIDE EFFECTS.   ibuprofen 200 MG tablet Commonly known as:  ADVIL,MOTRIN Take 200 mg by mouth as needed.   LORazepam 0.5 MG tablet Commonly known as:  ATIVAN Take 0.5 mg by mouth at bedtime. For sleep   omeprazole 20 MG capsule Commonly known as:  PRILOSEC Take 20 mg by mouth daily.   polyethylene glycol packet Commonly known as:  MIRALAX / GLYCOLAX Take 17 g by mouth daily.   pyridOXINE 100 MG tablet Commonly known as:  VITAMIN B-6 Take 100 mg by mouth daily.   VESICARE PO Take by mouth.   solifenacin 10 MG tablet Commonly known as:  VESICARE Take 10 mg by mouth daily.   zolpidem 5 MG tablet Commonly known as:  AMBIEN Take 1 tablet (5 mg total) by mouth at bedtime as needed for sleep.       Review of Systems:  Review of Systems  Constitutional: Negative for chills, diaphoresis and fever.  HENT: Negative for congestion, ear discharge, ear pain, hearing loss, sore throat and tinnitus.   Eyes: Negative for pain and redness.  Respiratory: Negative for cough, shortness of breath and wheezing.   Cardiovascular: Negative for chest pain, palpitations and leg swelling.    Gastrointestinal: Negative for abdominal pain, constipation, diarrhea and nausea.       Some heartburn. Using times regularly. Has [place] esophageal dysphagia. Food sticks in chest. Generally this is solids.  Endocrine: Negative for polydipsia.  Genitourinary: Negative for dysuria, flank pain, frequency, hematuria and urgency.       Intermittent incontinence. Has not seen urologist. History of cystocele.  Musculoskeletal: Positive for arthralgias. Negative for back pain, myalgias and neck pain.       History of osteopenia. Last bone density 2013.  Skin: Negative for rash.       Split in the right toenail.  Previous removal of this toenail and irregular regrowth.  Neurological: Positive for dizziness (Loss of balance with turning or walking fast.). Negative for tremors, seizures, weakness and headaches.       Her son would like her to have a neurology appointment for episodes of dizziness. Patient is reluctant to do this at this time Pain feet, restless legs, takes Neurontin  Hematological: Does not bruise/bleed easily.       Essential thrombocythemia under treatment with Hydrea  Psychiatric/Behavioral: Positive for sleep disturbance. Negative for hallucinations and suicidal ideas. The patient is not nervous/anxious.     Health Maintenance  Topic Date Due  . TETANUS/TDAP  04/11/2019  . INFLUENZA VACCINE  Completed  . DEXA SCAN  Completed  . ZOSTAVAX  Completed  . PNA vac Low Risk Adult  Completed    Physical Exam: Vitals:   12/20/15 1518  BP: 138/72  Pulse: 80  Resp: 20  Temp: 97.4 F (36.3 C)  TempSrc: Oral  Weight: 136 lb 12.8 oz (62.1 kg)  Height: 5\' 3"  (1.6 m)   Body mass index is 24.23 kg/m. Physical Exam  Constitutional: She is oriented to person, place, and time. She appears well-developed and well-nourished. No distress.  HENT:  Right Ear: External ear normal.  Left Ear: External ear normal.  Nose: Nose normal.  Mouth/Throat: Oropharynx is clear and moist. No  oropharyngeal exudate.  Eyes: Conjunctivae and EOM are normal. Pupils are equal, round, and reactive to light. No scleral icterus.  Neck: No JVD present. No tracheal deviation present. No thyromegaly present.  Cardiovascular: Normal rate, regular rhythm and intact distal pulses.  Exam reveals no gallop and no friction rub.   Murmur heard. 2-3 systolic murmurs  Pulmonary/Chest: Effort normal. No respiratory distress. She has no wheezes. She has no rales. She exhibits no tenderness.  Abdominal: She exhibits no distension and no mass. There is no tenderness.  Genitourinary:  Genitourinary Comments: Patient prefers female physician for pelvic exams.  Musculoskeletal: Normal range of motion. She exhibits edema and tenderness.  Chronic right foot pain. Trace edema in ankles.   Lymphadenopathy:    She has no cervical adenopathy.  Neurological: She is alert and oriented to person, place, and time. No cranial nerve deficit. Coordination normal.  Skin: No rash noted. She is not diaphoretic. No erythema. No pallor.  Split toenail right great toe.  Psychiatric: She has a normal mood and affect. Her behavior is normal. Judgment and thought content normal.    Labs reviewed: Basic Metabolic Panel:  Recent Labs  01/23/15 09/04/15  NA 141 138  K 4.0 4.0  BUN 14 14  CREATININE 0.8 0.8  TSH 4.22 3.61   Liver Function Tests:  Recent Labs  01/23/15 09/04/15  AST 19 20  ALT 23 26  ALKPHOS 46 39   No results for input(s): LIPASE, AMYLASE in the last 8760 hours. No results for input(s): AMMONIA in the last 8760 hours. CBC:  Recent Labs  01/23/15 05/22/15 0950 09/04/15  WBC 2.9 2.9* 3.2  NEUTROABS 3 1.5*  --   HGB 12.8 10.4* 11.9*  HCT 37 31.3* 35*  MCV  --  118.1*  --   PLT 407* 415* 407*   Lipid Panel:  Recent Labs  01/23/15  CHOL 128  HDL 51  LDLCALC 56  TRIG 104   No results found for: HGBA1C  Procedures since last visit: No results  found.  Assessment/Plan Insomnia Some trouble staying asleep, Benadryl, Lorazepam, Mirtazapine failed. Will try Ambien 5mg .  Essential hypertension, benign Well controlled,  on HCTZ, no changes at this time. 09/04/15 Na 138, K 4.0, Bun 14, creat 0.8    Essential thrombocythemia ([facility]) F/u oncology Continue Hydroxyurea   Restless leg syndrome Feet pain, restless legs, continue gabapentin 300mg  am and 900mg  pm    URINARY INCONTINENCE, URGE, MILD Continue Vesicare, UA C/S    Labs/tests ordered:  UA C/S  Next appt:  6 months

## 2015-12-20 NOTE — Assessment & Plan Note (Signed)
F/u oncology Continue Hydroxyurea

## 2015-12-20 NOTE — Assessment & Plan Note (Signed)
Continue Vesicare, UA C/S

## 2015-12-20 NOTE — Assessment & Plan Note (Signed)
Feet pain, restless legs, continue gabapentin 300mg  am and 900mg  pm

## 2015-12-25 ENCOUNTER — Telehealth: Payer: Self-pay

## 2015-12-25 ENCOUNTER — Encounter: Payer: Self-pay | Admitting: Nurse Practitioner

## 2015-12-25 DIAGNOSIS — G4700 Insomnia, unspecified: Secondary | ICD-10-CM

## 2015-12-25 NOTE — Telephone Encounter (Signed)
Prior Auth was denied. Patient needs to try and fail Belsomra and Rozerem. Please send new rx for either to patient's pharmacy

## 2015-12-25 NOTE — Telephone Encounter (Signed)
Prior Authorization form received via fax for Ambien. Form was completed and faxed back to [facility]. Awaiting response from insurance company.  Completed from was placed in Clinical Intake PA folder while awaiting response then to be sent to scanning

## 2015-12-26 MED ORDER — ESZOPICLONE 1 MG PO TABS: 1.0000 mg | ORAL_TABLET | Freq: Every evening | ORAL | 0 refills | Status: DC | PRN

## 2015-12-26 NOTE — Telephone Encounter (Signed)
Spoke with the apartment nurseJonelle Sidle) regarding this issue with her Ambien.  Johnnye Sima was called into pharmacy( [facility]) for patient to pick up this afternoon.

## 2015-12-26 NOTE — Telephone Encounter (Signed)
-----   Message from Logan Bores, [place] sent at 12/25/2015  3:45 PM EST ----- Please have [facility] address phone note on Ambien Denial. Call in alternative to patient's pharmacy please

## 2016-01-06 ENCOUNTER — Other Ambulatory Visit: Payer: Self-pay | Admitting: Oncology

## 2016-01-06 DIAGNOSIS — D759 Disease of blood and blood-forming organs, unspecified: Secondary | ICD-10-CM

## 2016-01-27 ENCOUNTER — Other Ambulatory Visit: Payer: Self-pay | Admitting: Internal Medicine

## 2016-01-28 ENCOUNTER — Other Ambulatory Visit: Payer: Self-pay | Admitting: Nurse Practitioner

## 2016-02-01 ENCOUNTER — Other Ambulatory Visit: Payer: Self-pay | Admitting: *Deleted

## 2016-02-01 DIAGNOSIS — G4700 Insomnia, unspecified: Secondary | ICD-10-CM

## 2016-02-01 MED ORDER — SUVOREXANT 10 MG PO TABS: 10.0000 mg | ORAL_TABLET | Freq: Every day | ORAL | 3 refills | Status: DC

## 2016-02-09 ENCOUNTER — Other Ambulatory Visit: Payer: Self-pay | Admitting: Nurse Practitioner

## 2016-03-13 LAB — CBC AND DIFFERENTIAL
HCT: 34 % — AB (ref 36–46)
HEMOGLOBIN: 12.2 g/dL (ref 12.0–16.0)
Platelets: 398 10*3/uL (ref 150–399)
WBC: 3.9 10*3/mL

## 2016-03-17 ENCOUNTER — Other Ambulatory Visit: Payer: Self-pay | Admitting: *Deleted

## 2016-05-19 ENCOUNTER — Telehealth: Payer: Self-pay | Admitting: *Deleted

## 2016-05-19 NOTE — Telephone Encounter (Signed)
Received labs from [facility].  Pt called - no answer; left message" to continue current dose of Hydrea" per Dr Beryle Beams. And call for questions. Labs to be scanned.

## 2016-05-30 ENCOUNTER — Other Ambulatory Visit: Payer: Self-pay | Admitting: Oncology

## 2016-05-30 DIAGNOSIS — D759 Disease of blood and blood-forming organs, unspecified: Secondary | ICD-10-CM

## 2016-06-19 ENCOUNTER — Non-Acute Institutional Stay: Payer: Medicare Other | Admitting: Nurse Practitioner

## 2016-06-19 DIAGNOSIS — D473 Essential (hemorrhagic) thrombocythemia: Secondary | ICD-10-CM

## 2016-06-19 DIAGNOSIS — G4700 Insomnia, unspecified: Secondary | ICD-10-CM

## 2016-06-19 DIAGNOSIS — K5900 Constipation, unspecified: Secondary | ICD-10-CM

## 2016-06-19 DIAGNOSIS — K5909 Other constipation: Secondary | ICD-10-CM | POA: Insufficient documentation

## 2016-06-19 DIAGNOSIS — G2581 Restless legs syndrome: Secondary | ICD-10-CM

## 2016-06-19 DIAGNOSIS — N3941 Urge incontinence: Secondary | ICD-10-CM | POA: Diagnosis not present

## 2016-06-19 DIAGNOSIS — K219 Gastro-esophageal reflux disease without esophagitis: Secondary | ICD-10-CM

## 2016-06-19 DIAGNOSIS — I10 Essential (primary) hypertension: Secondary | ICD-10-CM | POA: Diagnosis not present

## 2016-06-19 NOTE — Progress Notes (Signed)
Patient ID: Jill Myers, female   DOB: 09-18-1930, 83 y.o.   MRN: [id number]   Location:   [facility]   Place of Service:   clinic [place]  Provider: Marlana Latus NP  Code Status: DNR Goals of Care:  Advanced Directives 12/20/2015  Does Patient Have a Medical Advance Directive? Yes  Type of Advance Directive Living will;Healthcare Power of Attorney  Does patient want to make changes to medical advance directive? No - Patient declined  Copy of [place] in Chart? Yes  Would patient like information on creating a medical advance directive? -     No chief complaint on file.   HPI: Patient is a 83 y.o. female seen today for chronic medical conditions.   Hx of restless legs/peripheral neuropathy, managed with Neurontin 300mg  am and 900mg  nightly. Her blood pressure is controlled on HCTZ 25mg . 09/04/15 Na 138, K 4.0, Bun 14, creat 0.8  Urinary frequency is stable, on Vesicare nightly. Persisted insomnia, taking Belsomra, but c/o sleepy next day.   Past Medical History:  Diagnosis Date  . Bladder cystocele 05/21/2010  . Deglutition disorder 08/17/2012   Single episode of choking/aspiration; preceded by trouble swallowing saliva (thin liquids).  For SLP evaluation. Aug 17, 2012.    [name] Disease of blood and blood forming organ 01/05/2007   Qualifier: Diagnosis of  By: Oneida Alar MD, KARL    . Diverticulitis   . DIVERTICULOSIS OF COLON 03/05/2006   Qualifier: Diagnosis of  By: Samara Snide    . DIZZINESS 01/18/2010   Qualifier: Diagnosis of  By: Lindell Noe MD, Jeneen Rinks    . Essential hypertension, benign 02/22/2008   Qualifier: Diagnosis of  By: Lindell Noe MD, Jeneen Rinks     . Essential thrombocythemia ([place]) 01/31/11  . Family history of Alzheimer's disease 11/11/2013   Patient with family history of Alzheimers disease. She herself is very highly functional.  Plans for future visit with MMSE as primary function of the visit.    [name] GERD (gastroesophageal reflux disease) 08/16/2010  . HALLUX VALGUS, ACQUIRED  04/10/2009   Qualifier: Diagnosis of  By: [name] CMA,, Thekla    . HYPERTRIGLYCERIDEMIA 01/05/2007   Qualifier: Diagnosis of  By: Oneida Alar MD, KARL    . Insomnia 02/10/2014  . Left-sided chest wall pain 12/19/2011    L sided chest wall pain follows a dermatomal distribution and raises the possibility of thoracic radiculopathic pain, which would be expected to improve with gabapentin as well. We discussed this at length and she will call to make me aware if this does not get better. Rib belt/girdle in the meantime; may consider rib films or CXR if not improving.     . Metatarsalgia of right foot 11/28/2013   Referral to [facility]   . Myeloma ([place])    prolefica  . Osteopenia   . Restless leg syndrome 06/09/2011  . Restless legs syndrome (RLS)   . URINARY INCONTINENCE, URGE, MILD 11/27/2009   Qualifier: Diagnosis of  By: Zebedee Iba NP, Manuela Schwartz    . Vitamin D deficiency 08/16/2010    Past Surgical History:  Procedure Laterality Date  . BIOPSY BREAST Left 1998   Dr. Rebekah Chesterfield  . CATARACT EXTRACTION  2010   Dr. [name]  . CHOLECYSTECTOMY  1992  . COLONOSCOPY  2008  . MOHS SURGERY  2012   on face [facility]  . NAILBED REPAIR  12/05/2010   Procedure: NAILBED REPAIR;  Surgeon: Cammie Sickle., MD;  Location: [facility];  Service: Orthopedics;  Laterality: Right;  full thickness nail biopsy right hallux  . torn tenden  2012   hand Dr. Orie Rout    Allergies  Allergen Reactions  . Doxycycline Other (See Comments)  . Macrodantin [Nitrofurantoin] Nausea And Vomiting  . Sulfamethoxazole-Trimethoprim Rash    REACTION: Rash after completing course of Septra    Allergies as of 06/19/2016      Reactions   Doxycycline Other (See Comments)   Macrodantin [nitrofurantoin] Nausea And Vomiting   Sulfamethoxazole-trimethoprim Rash   REACTION: Rash after completing course of Septra      Medication List       Accurate as of 06/19/16  2:58 PM. Always use your most recent med list.            aspirin 81 MG tablet Take 81 mg by mouth daily.   cholecalciferol 1000 units tablet Commonly known as:  VITAMIN D Take 1,000 Units by mouth daily.   docusate sodium 250 MG capsule Commonly known as:  COLACE Take 250 mg by mouth daily.   eszopiclone 1 MG Tabs tablet Commonly known as:  LUNESTA Take 1 tablet (1 mg total) by mouth at bedtime as needed for sleep.   fish oil-omega-3 fatty acids 1000 MG capsule Take 1 g by mouth daily.   gabapentin 300 MG capsule Commonly known as:  NEURONTIN TAKE ONE CAPSULE BY MOUTH EVERY MORNING 2 CAPS AT NOON AND 3 CAPS AT EVENING   Glucosamine-Chondroitin 750-600 MG Tabs Take 1 tablet by mouth 2 (two) times daily.   hydrochlorothiazide 25 MG tablet Commonly known as:  HYDRODIURIL TAKE 1 TABLET BY MOUTH EVERY DAY   hydroxyurea 500 MG capsule Commonly known as:  HYDREA TAKE 2 CAPSULES (1,000 MG TOTAL) BY MOUTH DAILY. MAY TAKE WITH FOOD TO MINIMIZE GI SIDE EFFECTS.   ibuprofen 200 MG tablet Commonly known as:  ADVIL,MOTRIN Take 200 mg by mouth as needed.   LORazepam 0.5 MG tablet Commonly known as:  ATIVAN Take 0.5 mg by mouth at bedtime. For sleep   omeprazole 20 MG capsule Commonly known as:  PRILOSEC Take 20 mg by mouth daily.   polyethylene glycol packet Commonly known as:  MIRALAX / GLYCOLAX Take 17 g by mouth daily.   pyridOXINE 100 MG tablet Commonly known as:  VITAMIN B-6 Take 100 mg by mouth daily.   Suvorexant 10 MG Tabs Commonly known as:  BELSOMRA Take 10 mg by mouth at bedtime.   VESICARE PO Take by mouth.   solifenacin 10 MG tablet Commonly known as:  VESICARE Take 10 mg by mouth daily.   zolpidem 5 MG tablet Commonly known as:  AMBIEN Take 1 tablet (5 mg total) by mouth at bedtime as needed for sleep.       Review of Systems:  Review of Systems  Constitutional: Negative for chills, diaphoresis and fever.  HENT: Negative for congestion, ear discharge, ear pain, hearing loss, sore throat  and tinnitus.   Eyes: Negative for pain and redness.  Respiratory: Negative for cough, shortness of breath and wheezing.   Cardiovascular: Negative for chest pain, palpitations and leg swelling.  Gastrointestinal: Positive for constipation. Negative for abdominal pain, diarrhea and nausea.       Some heartburn. Using times regularly. Has [place] esophageal dysphagia. Food sticks in chest. Generally this is solids.  Endocrine: Negative for polydipsia.  Genitourinary: Negative for dysuria, flank pain, frequency, hematuria and urgency.       Intermittent incontinence. Has not seen urologist. History of cystocele.  Musculoskeletal: Positive  for arthralgias. Negative for back pain, myalgias and neck pain.       History of osteopenia. Last bone density 2013.  Skin: Negative for rash.       Split in the right toenail. Previous removal of this toenail and irregular regrowth.  Neurological: Negative for dizziness (Loss of balance with turning or walking fast.), tremors, seizures, weakness and headaches.       Her son would like her to have a neurology appointment for episodes of dizziness. Patient is reluctant to do this at this time Pain feet, restless legs, takes Neurontin  Hematological: Does not bruise/bleed easily.       Essential thrombocythemia under treatment with Hydrea  Psychiatric/Behavioral: Positive for sleep disturbance. Negative for hallucinations and suicidal ideas. The patient is not nervous/anxious.     Health Maintenance  Topic Date Due  . INFLUENZA VACCINE  08/06/2016  . TETANUS/TDAP  04/11/2019  . DEXA SCAN  Completed  . PNA vac Low Risk Adult  Completed    Physical Exam: There were no vitals filed for this visit. There is no height or weight on file to calculate BMI. Physical Exam  Constitutional: She is oriented to person, place, and time. She appears well-developed and well-nourished. No distress.  HENT:  Right Ear: External ear normal.  Left Ear: External ear  normal.  Nose: Nose normal.  Mouth/Throat: Oropharynx is clear and moist. No oropharyngeal exudate.  Eyes: Conjunctivae and EOM are normal. Pupils are equal, round, and reactive to light. No scleral icterus.  Neck: No JVD present. No tracheal deviation present. No thyromegaly present.  Cardiovascular: Normal rate, regular rhythm and intact distal pulses.  Exam reveals no gallop and no friction rub.   Murmur heard. 2-3 systolic murmurs  Pulmonary/Chest: Effort normal. No respiratory distress. She has no wheezes. She has no rales. She exhibits no tenderness.  Abdominal: She exhibits no distension and no mass. There is no tenderness.  Genitourinary:  Genitourinary Comments: Patient prefers female physician for pelvic exams.  Musculoskeletal: Normal range of motion. She exhibits tenderness. She exhibits no edema.  Chronic right foot pain.   Lymphadenopathy:    She has no cervical adenopathy.  Neurological: She is alert and oriented to person, place, and time. No cranial nerve deficit. Coordination normal.  Skin: No rash noted. She is not diaphoretic. No erythema. No pallor.  Split toenail right great toe.  Psychiatric: She has a normal mood and affect. Her behavior is normal. Judgment and thought content normal.    Labs reviewed: Basic Metabolic Panel:  Recent Labs  09/04/15  NA 138  K 4.0  BUN 14  CREATININE 0.8  TSH 3.61   Liver Function Tests:  Recent Labs  09/04/15  AST 20  ALT 26  ALKPHOS 39   No results for input(s): LIPASE, AMYLASE in the last 8760 hours. No results for input(s): AMMONIA in the last 8760 hours. CBC:  Recent Labs  09/04/15 03/13/16  WBC 3.2 3.9  HGB 11.9* 12.2  HCT 35* 34*  PLT 407* 398   Lipid Panel: No results for input(s): CHOL, HDL, LDLCALC, TRIG, CHOLHDL, LDLDIRECT in the last 8760 hours. No results found for: HGBA1C  Procedures since last visit: No results found.  Assessment/Plan Essential hypertension, benign Well controlled,  on  HCTZ, no changes at this time. 09/04/15 Na 138, K 4.0, Bun 14, creat 0.8. Update CMP prior to the next appointment.   Essential thrombocythemia ([place]) Last plt 398 03/13/16, repeat CBC prior to the next appointment.  F/u Hematology. Continue Hydroxyurea 1000mg  qd  Insomnia Belsomra is effective, but feels sedating next day, continue it at prn basis, willing to try Mirtazapine 1/2 7.5mg  qhs,  observe for adverse reactions. Update TSH prior to the next appointment.   Restless leg syndrome Feet pain, restless legs are managed with Gabapentin, continue gabapentin 300mg  am and 900mg  pm  URINARY INCONTINENCE, URGE, MILD Managed with Vesicare 10mg , average 1x/night, underwent urology evaluation.   Constipation Like to try probiotics, may resume Miralax daily if fails probiotics.   GERD (gastroesophageal reflux disease) Omeprazole daily, occasional heartburns    Labs/tests ordered:  CBC CMP TSH prior to the next appointment.   Next appt:  6 months

## 2016-06-19 NOTE — Assessment & Plan Note (Addendum)
Belsomra is effective, but feels sedating next day, continue it at prn basis, willing to try Mirtazapine 1/2 7.5mg  qhs,  observe for adverse reactions. Update TSH prior to the next appointment.

## 2016-06-19 NOTE — Assessment & Plan Note (Addendum)
Last plt 398 03/13/16, repeat CBC prior to the next appointment. F/u Hematology. Continue Hydroxyurea 1000mg  qd

## 2016-06-19 NOTE — Assessment & Plan Note (Signed)
Well controlled,  on HCTZ, no changes at this time. 09/04/15 Na 138, K 4.0, Bun 14, creat 0.8. Update CMP prior to the next appointment.

## 2016-06-19 NOTE — Assessment & Plan Note (Addendum)
Managed with Vesicare 10mg , average 1x/night, underwent urology evaluation.

## 2016-06-19 NOTE — Assessment & Plan Note (Addendum)
Feet pain, restless legs are managed with Gabapentin, continue gabapentin 300mg  am and 900mg  pm

## 2016-06-19 NOTE — Patient Instructions (Addendum)
F/u 6 months. CBC CMP TSH prior to the next appointment.

## 2016-06-19 NOTE — Assessment & Plan Note (Signed)
Like to try probiotics, may resume Miralax daily if fails probiotics.

## 2016-06-19 NOTE — Assessment & Plan Note (Signed)
Omeprazole daily, occasional heartburns

## 2016-06-20 ENCOUNTER — Encounter: Payer: Self-pay | Admitting: Nurse Practitioner

## 2016-07-18 ENCOUNTER — Other Ambulatory Visit: Payer: Self-pay | Admitting: Nurse Practitioner

## 2016-07-21 ENCOUNTER — Telehealth: Payer: Self-pay | Admitting: *Deleted

## 2016-07-21 NOTE — Telephone Encounter (Addendum)
Dr Beryle Beams received lab results - stated "continue current dose of Hydrea".  I called pt - no answer; left message per Dr Darnell Level. And to call me for any questions. Results  (07/17/16) to be scanned.

## 2016-08-06 ENCOUNTER — Other Ambulatory Visit: Payer: Self-pay | Admitting: Nurse Practitioner

## 2016-08-19 ENCOUNTER — Other Ambulatory Visit: Payer: Self-pay | Admitting: *Deleted

## 2016-08-19 DIAGNOSIS — G4700 Insomnia, unspecified: Secondary | ICD-10-CM

## 2016-09-09 ENCOUNTER — Encounter: Payer: Medicare Other | Admitting: Internal Medicine

## 2016-09-22 ENCOUNTER — Telehealth: Payer: Self-pay | Admitting: *Deleted

## 2016-09-22 NOTE — Telephone Encounter (Signed)
Received lab results from [facility]. Called pt / informed "to stay on current dose of Hydrea" per Dr Beryle Beams. Voiced understanding. Results to be scanned.

## 2016-09-23 ENCOUNTER — Encounter: Payer: Self-pay | Admitting: Internal Medicine

## 2016-09-23 ENCOUNTER — Non-Acute Institutional Stay: Payer: Medicare Other | Admitting: Internal Medicine

## 2016-09-23 VITALS — BP 118/64 | HR 71 | Temp 97.7°F | Resp 18 | Ht 63.0 in | Wt 135.4 lb

## 2016-09-23 DIAGNOSIS — F5101 Primary insomnia: Secondary | ICD-10-CM

## 2016-09-23 DIAGNOSIS — K5909 Other constipation: Secondary | ICD-10-CM | POA: Diagnosis not present

## 2016-09-23 MED ORDER — ESZOPICLONE 1 MG PO TABS: 1.0000 mg | ORAL_TABLET | Freq: Every day | ORAL | 3 refills | Status: DC

## 2016-09-23 NOTE — Progress Notes (Signed)
[facility]  Provider: [name] MD   Location:  [facility]   Place of Service:  Clinic ([id number])  PCP: Estill Dooms, MD Patient Care Team: Estill Dooms, MD as PCP - General (Internal Medicine) Annia Belt, MD as Consulting Physician (Oncology) [name], MD as Consulting Physician (Dermatology) [name], MD as Consulting Physician (Sports Medicine) [name], MD (Inactive) as Consulting Physician [facility] Medicine)  Extended Emergency Contact Information Primary Emergency Contact: [facility] Address: [address] [place] [address] [name] of [place] Phone: [phone] Mobile Phone: [phone] Relation: Son  Goals of Care: Advanced Directive information Advanced Directives 06/20/2016  Does Patient Have a Medical Advance Directive? Yes  Type of Advance Directive [place]  Does patient want to make changes to medical advance directive? -  Copy of [place] in Chart? Yes  Would patient like information on creating a medical advance directive? -      Chief Complaint  Patient presents with  . Acute Visit    sleep problem, constipation  . Medication Refill    No refills needed at this time.     HPI: Patient is a 83 y.o. female seen today for acute visit.   Sleep problem- she is having trouble falling asleep for a year. She has tried melatonin and decongestants in the past without much help. Lorrin Mais was refused by her insurance in past. She has trouble falling asleep and staying asleep. She tried belsomra that made her groggy and she stopped it. She was also prescribed lunesta but never tried it. This problem with falling asleep has been effecting her. She remains active during daytime.   Constipation- chronic problem. Taking colace and miralax. Mentions these to be helping. Had a bowel movement this am. Denies blood  in stool. Denies straining or hemorrhoids. Has maintained hydration and fiber intake   Past Medical History:  Diagnosis Date  . Bladder cystocele 05/21/2010  . Deglutition disorder 08/17/2012   Single episode of choking/aspiration; preceded by trouble swallowing saliva (thin liquids).  For SLP evaluation. Aug 17, 2012.    [name] Disease of blood and blood forming organ 01/05/2007   Qualifier: Diagnosis of  By: Oneida Alar MD, KARL    . Diverticulitis   . DIVERTICULOSIS OF COLON 03/05/2006   Qualifier: Diagnosis of  By: Samara Snide    . DIZZINESS 01/18/2010   Qualifier: Diagnosis of  By: Lindell Noe MD, Jeneen Rinks    . Essential hypertension, benign 02/22/2008   Qualifier: Diagnosis of  By: Lindell Noe MD, Jeneen Rinks     . Essential thrombocythemia ([place]) 01/31/11  . Family history of Alzheimer's disease 11/11/2013   Patient with family history of Alzheimers disease. She herself is very highly functional.  Plans for future visit with MMSE as primary function of the visit.    [name] GERD (gastroesophageal reflux disease) 08/16/2010  . HALLUX VALGUS, ACQUIRED 04/10/2009   Qualifier: Diagnosis of  By: [name] CMA,, Thekla    . HYPERTRIGLYCERIDEMIA 01/05/2007   Qualifier: Diagnosis of  By: Oneida Alar MD, KARL    . Insomnia 02/10/2014  . Left-sided chest wall pain 12/19/2011    L sided chest wall pain follows a dermatomal distribution and raises the possibility of thoracic radiculopathic pain, which would be expected to improve with gabapentin as well. We discussed this at length and she will call to make me aware if  this does not get better. Rib belt/girdle in the meantime; may consider rib films or CXR if not improving.     . Metatarsalgia of right foot 11/28/2013   Referral to [facility]   . Myeloma ([place])    prolefica  . Osteopenia   . Restless leg syndrome 06/09/2011  . Restless legs syndrome (RLS)   . URINARY INCONTINENCE, URGE, MILD 11/27/2009   Qualifier: Diagnosis of  By: Zebedee Iba NP, Manuela Schwartz    . Vitamin D deficiency 08/16/2010   Past Surgical  History:  Procedure Laterality Date  . BIOPSY BREAST Left 1998   Dr. Rebekah Chesterfield  . CATARACT EXTRACTION  2010   Dr. [name]  . CHOLECYSTECTOMY  1992  . COLONOSCOPY  2008  . MOHS SURGERY  2012   on face [facility]  . NAILBED REPAIR  12/05/2010   Procedure: NAILBED REPAIR;  Surgeon: Cammie Sickle., MD;  Location: [facility];  Service: Orthopedics;  Laterality: Right;  full thickness nail biopsy right hallux  . torn tenden  2012   hand Dr. Orie Rout    reports that she quit smoking about 57 years ago. She has never used smokeless tobacco. She reports that she drinks alcohol. She reports that she does not use drugs. Social History   Social History  . Marital status: Widowed    Spouse name: N/A  . Number of children: 2  . Years of education: bachelors   Occupational History  . [place] Unemployed  . Retired- Paramedic    Social History Main Topics  . Smoking status: Former Smoker    Quit date: 12/02/1958  . Smokeless tobacco: Never Used  . Alcohol use 0.0 oz/week     Comment: Wine rarely.  . Drug use: No  . Sexual activity: No   Other Topics Concern  . Not on file   Social History Narrative   Health Care POA: [name]- Son   Emergency Contact: neighbor, [name] [phone]   End of Life Plan: POA, Living Will   Widow   Lives at [facility] since about 2013.   Any pets: none   Diet: Pt has a varied diet of protein, starch, vegetables   Exercise: Pt exercises 3x week for 1 hour with group.   Seatbelts: Pt reports wearing seatbelt when in vehicles.    Sun Exposure/Protection: pt does not wear sun protection   Hobbies: opera, symphony, reading, walking           Family History  Problem Relation Age of Onset  . Heart disease Mother   . Tuberculosis Father   . Heart disease Sister   . Heart disease Brother   . Alzheimer's disease Brother   . Alzheimer's disease Brother     Health Maintenance    Topic Date Due  . INFLUENZA VACCINE  10/06/2016 (Originally 08/06/2016)  . TETANUS/TDAP  04/11/2019  . DEXA SCAN  Completed  . PNA vac Low Risk Adult  Completed    Allergies  Allergen Reactions  . Doxycycline Other (See Comments)  . Macrodantin [Nitrofurantoin] Nausea And Vomiting  . Sulfamethoxazole-Trimethoprim Rash    REACTION: Rash after completing course of Septra    Outpatient Encounter Prescriptions as of 09/23/2016  Medication Sig  . aspirin 81 MG tablet Take 81 mg by mouth daily.    . cholecalciferol (VITAMIN D) 1000 UNITS tablet Take 1,000 Units by mouth daily.  [name] docusate sodium (COLACE) 250 MG capsule Take 250 mg by mouth daily.  [name]  fish oil-omega-3 fatty acids 1000 MG capsule Take 1 g by mouth daily.    [name] gabapentin (NEURONTIN) 300 MG capsule Take 300 mg by mouth. Take one capsule every morning and 3 capsules at evening  . Glucosamine-Chondroitin 750-600 MG TABS Take 1 tablet by mouth. Take one tablet daily  . hydrochlorothiazide (HYDRODIURIL) 25 MG tablet TAKE 1 TABLET BY MOUTH EVERY DAY  . hydroxyurea (HYDREA) 500 MG capsule TAKE 2 CAPSULES (1,000 MG TOTAL) BY MOUTH DAILY. MAY TAKE WITH FOOD TO MINIMIZE GI SIDE EFFECTS.  [name] ibuprofen (ADVIL,MOTRIN) 200 MG tablet Take 200 mg by mouth as needed.  [name] omeprazole (PRILOSEC) 20 MG capsule Take 20 mg by mouth daily.  . polyethylene glycol (MIRALAX / GLYCOLAX) packet Take 17 g by mouth daily.    [name] pyridOXINE (VITAMIN B-6) 100 MG tablet Take 100 mg by mouth daily.  . solifenacin (VESICARE) 10 MG tablet Take 10 mg by mouth daily.  . [DISCONTINUED] Solifenacin Succinate (VESICARE PO) Take by mouth.   No facility-administered encounter medications on file as of 09/23/2016.     Review of Systems  Constitutional: Negative for appetite change and fever.  HENT: Negative for sore throat.   Respiratory: Negative for shortness of breath.   Cardiovascular: Negative for chest pain.  Gastrointestinal: Positive for constipation. Negative  for abdominal pain, blood in stool, nausea and vomiting.  Musculoskeletal: Negative for back pain.  Psychiatric/Behavioral: Positive for sleep disturbance. Negative for dysphoric mood and hallucinations. The patient is not nervous/anxious.     Vitals:   09/23/16 1128  BP: 118/64  Pulse: 71  Resp: 18  Temp: 97.7 F (36.5 C)  TempSrc: Oral  SpO2: 94%  Weight: 135 lb 6.4 oz (61.4 kg)  Height: 5\' 3"  (1.6 m)   Body mass index is 23.99 kg/m. Physical Exam  Constitutional: She is oriented to person, place, and time. She appears well-developed and well-nourished.  HENT:  Head: Normocephalic and atraumatic.  Eyes: Pupils are equal, round, and reactive to light. Conjunctivae are normal.  Neck: Neck supple.  Cardiovascular: Normal rate and regular rhythm.   Pulmonary/Chest: Effort normal and breath sounds normal.  Abdominal: Soft. Bowel sounds are normal. She exhibits no distension. There is no tenderness. There is no rebound and no guarding.  Musculoskeletal: Normal range of motion. She exhibits edema.  Neurological: She is alert and oriented to person, place, and time.  Skin: Skin is warm and dry. She is not diaphoretic.  Psychiatric: She has a normal mood and affect.    Labs reviewed: Basic Metabolic Panel: No results for input(s): NA, K, CL, CO2, GLUCOSE, BUN, CREATININE, CALCIUM, MG, PHOS in the last 8760 hours. Liver Function Tests: No results for input(s): AST, ALT, ALKPHOS, BILITOT, PROT, ALBUMIN in the last 8760 hours. No results for input(s): LIPASE, AMYLASE in the last 8760 hours. No results for input(s): AMMONIA in the last 8760 hours. CBC:  Recent Labs  03/13/16  WBC 3.9  HGB 12.2  HCT 34*  PLT 398   Cardiac Enzymes: No results for input(s): CKTOTAL, CKMB, CKMBINDEX, TROPONINI in the last 8760 hours. BNP: Invalid input(s): POCBNP No results found for: HGBA1C Lab Results  Component Value Date   TSH 3.61 09/04/2015   No results found for: VITAMINB12 No  results found for: FOLATE Lab Results  Component Value Date   IRON 90 05/22/2015   TIBC 245 (L) 05/22/2015   FERRITIN 164 (H) 05/22/2015    Lipid Panel: No results for input(s): CHOL, HDL, LDLCALC, TRIG, CHOLHDL,  LDLDIRECT in the last 8760 hours. No results found for: HGBA1C  Procedures since last visit: No results found.  Assessment/Plan  1. Primary insomnia Will try lunesta 1 mg daily at bedtime along with sleep hygiene, pt agrees to plan. Reassess in 4 weeks. Common side effect profile reviewed.  2. Chronic constipation Denies abdominal pain, nausea and vomiting. Continue colace and miralax current regimen. Maintain hydration and fiber intake.     Labs/tests ordered:  none  Next appointment: 4 weeks  Communication: reviewed care plan with pt. Hard script for lunesta provided.    [name], MD Internal Medicine [facility] [address] [place], [place] [address] Cell Phone (Monday-Friday 8 am - 5 pm): [phone] On Call: [phone] and follow prompts after 5 pm and on weekends Office Phone: [phone] Office Fax: [phone]

## 2016-10-25 ENCOUNTER — Other Ambulatory Visit: Payer: Self-pay | Admitting: Oncology

## 2016-10-25 DIAGNOSIS — D759 Disease of blood and blood-forming organs, unspecified: Secondary | ICD-10-CM

## 2016-10-28 ENCOUNTER — Encounter: Payer: Self-pay | Admitting: Internal Medicine

## 2016-11-10 ENCOUNTER — Other Ambulatory Visit: Payer: Self-pay | Admitting: Internal Medicine

## 2016-11-20 ENCOUNTER — Other Ambulatory Visit: Payer: Self-pay | Admitting: *Deleted

## 2016-11-20 MED ORDER — GABAPENTIN 300 MG PO CAPS: ORAL_CAPSULE | ORAL | 1 refills | Status: DC

## 2016-11-20 NOTE — Telephone Encounter (Signed)
[facility] 

## 2016-12-05 ENCOUNTER — Telehealth: Payer: Self-pay | Admitting: Internal Medicine

## 2016-12-05 NOTE — Telephone Encounter (Signed)
I called the patient to schedule her AWV-S, but she declined. VDM (DD)

## 2016-12-09 LAB — COMPREHENSIVE METABOLIC PANEL
AG RATIO: 1.9 (calc) (ref 1.0–2.5)
ALT: 25 U/L (ref 6–29)
AST: 21 U/L (ref 10–35)
Albumin: 4.1 g/dL (ref 3.6–5.1)
Alkaline phosphatase (APISO): 48 U/L (ref 33–130)
BUN: 14 mg/dL (ref 7–25)
CALCIUM: 9.1 mg/dL (ref 8.6–10.4)
CO2: 30 mmol/L (ref 20–32)
Chloride: 99 mmol/L (ref 98–110)
Creat: 0.8 mg/dL (ref 0.60–0.88)
GLUCOSE: 83 mg/dL (ref 65–99)
Globulin: 2.2 g/dL (calc) (ref 1.9–3.7)
Potassium: 3.9 mmol/L (ref 3.5–5.3)
SODIUM: 136 mmol/L (ref 135–146)
TOTAL PROTEIN: 6.3 g/dL (ref 6.1–8.1)
Total Bilirubin: 0.5 mg/dL (ref 0.2–1.2)

## 2016-12-09 LAB — CBC
HCT: 34 % — ABNORMAL LOW (ref 35.0–45.0)
HEMOGLOBIN: 12.2 g/dL (ref 11.7–15.5)
MCH: 40.3 pg — ABNORMAL HIGH (ref 27.0–33.0)
MCHC: 35.9 g/dL (ref 32.0–36.0)
MCV: 112.2 fL — AB (ref 80.0–100.0)
MPV: 9.2 fL (ref 7.5–12.5)
PLATELETS: 441 10*3/uL — AB (ref 140–400)
RBC: 3.03 10*6/uL — AB (ref 3.80–5.10)
RDW: 13.8 % (ref 11.0–15.0)
WBC: 3.3 10*3/uL — ABNORMAL LOW (ref 3.8–10.8)

## 2016-12-09 LAB — TSH: TSH: 3.97 m[IU]/L (ref 0.40–4.50)

## 2016-12-10 ENCOUNTER — Telehealth: Payer: Self-pay | Admitting: *Deleted

## 2016-12-10 NOTE — Telephone Encounter (Signed)
Called patient to give her the results of her lab work, left message for her to call me in clinic.

## 2016-12-10 NOTE — Telephone Encounter (Signed)
Spoke with patient regarding her lab resuts, she stated that she understood and didn't have any questions at this time.

## 2016-12-18 ENCOUNTER — Encounter: Payer: Self-pay | Admitting: Gastroenterology

## 2016-12-18 ENCOUNTER — Non-Acute Institutional Stay: Payer: Medicare Other | Admitting: Nurse Practitioner

## 2016-12-18 ENCOUNTER — Encounter: Payer: Self-pay | Admitting: Nurse Practitioner

## 2016-12-18 DIAGNOSIS — G2581 Restless legs syndrome: Secondary | ICD-10-CM | POA: Diagnosis not present

## 2016-12-18 DIAGNOSIS — I10 Essential (primary) hypertension: Secondary | ICD-10-CM | POA: Diagnosis not present

## 2016-12-18 DIAGNOSIS — D473 Essential (hemorrhagic) thrombocythemia: Secondary | ICD-10-CM | POA: Diagnosis not present

## 2016-12-18 DIAGNOSIS — F5101 Primary insomnia: Secondary | ICD-10-CM

## 2016-12-18 NOTE — Assessment & Plan Note (Signed)
Controlled, continue HCTZ 25 mg po daily.

## 2016-12-18 NOTE — Progress Notes (Signed)
Location:   [facility]   Place of Service:  Clinic (12) Provider: Marlana Latus NP  Code Status: DNR Goals of Care:  Advanced Directives 06/20/2016  Does Patient Have a Medical Advance Directive? Yes  Type of Advance Directive [place]  Does patient want to make changes to medical advance directive? -  Copy of [place] in Chart? Yes  Would patient like information on creating a medical advance directive? -     Chief Complaint  Patient presents with  . Medical Management of Chronic Issues    6 mo f/u - with review of labs    HPI: Patient is a 83 y.o. female seen today for medical management of chronic diseases.     Hx of insomnia, improved with sleep hygiene, no longer taking Lunesta, she occasionally stays up to 2 am but better than 4am prior. She takes short 30 minutes nap to make up her rest. Thrombocythemia, Chronic leukopenia, macrocytic anemia, secondary to chronic Hydrea use. F/u oncology. 12/12/16 wbc 3.3, Hgb 12.3, plt 441. F/u urology for urinary frequency, taking Vesicare 10mg  qd,  1-2x per night. Constipation, managed with MiraLax. Restless legs, stable on Gabapentin. HTN blood pressure is controlled on HCTZ 25mg  po qd.    Past Medical History:  Diagnosis Date  . Bladder cystocele 05/21/2010  . Deglutition disorder 08/17/2012   Single episode of choking/aspiration; preceded by trouble swallowing saliva (thin liquids).  For SLP evaluation. Aug 17, 2012.    [name] Disease of blood and blood forming organ 01/05/2007   Qualifier: Diagnosis of  By: Oneida Alar MD, KARL    . Diverticulitis   . DIVERTICULOSIS OF COLON 03/05/2006   Qualifier: Diagnosis of  By: Samara Snide    . DIZZINESS 01/18/2010   Qualifier: Diagnosis of  By: Lindell Noe MD, Jeneen Rinks    . Essential hypertension, benign 02/22/2008   Qualifier: Diagnosis of  By: Lindell Noe MD, Jeneen Rinks     . Essential thrombocythemia ([place]) 01/31/11  . Family history of Alzheimer's disease 11/11/2013   Patient with  family history of Alzheimers disease. She herself is very highly functional.  Plans for future visit with MMSE as primary function of the visit.    [name] GERD (gastroesophageal reflux disease) 08/16/2010  . HALLUX VALGUS, ACQUIRED 04/10/2009   Qualifier: Diagnosis of  By: [name] CMA,, Thekla    . HYPERTRIGLYCERIDEMIA 01/05/2007   Qualifier: Diagnosis of  By: Oneida Alar MD, KARL    . Insomnia 02/10/2014  . Left-sided chest wall pain 12/19/2011    L sided chest wall pain follows a dermatomal distribution and raises the possibility of thoracic radiculopathic pain, which would be expected to improve with gabapentin as well. We discussed this at length and she will call to make me aware if this does not get better. Rib belt/girdle in the meantime; may consider rib films or CXR if not improving.     . Metatarsalgia of right foot 11/28/2013   Referral to [facility]   . Myeloma ([place])    prolefica  . Osteopenia   . Restless leg syndrome 06/09/2011  . Restless legs syndrome (RLS)   . URINARY INCONTINENCE, URGE, MILD 11/27/2009   Qualifier: Diagnosis of  By: Zebedee Iba NP, Manuela Schwartz    . Vitamin D deficiency 08/16/2010    Past Surgical History:  Procedure Laterality Date  . BIOPSY BREAST Left 1998   Dr. Rebekah Chesterfield  . CATARACT EXTRACTION  2010   Dr. [name]  . CHOLECYSTECTOMY  1992  . COLONOSCOPY  2008  . [place]  2012   on face [facility]  . NAILBED REPAIR  12/05/2010   Procedure: NAILBED REPAIR;  Surgeon: Cammie Sickle., MD;  Location: [place];  Service: Orthopedics;  Laterality: Right;  full thickness nail biopsy right hallux  . torn tenden  2012   hand Dr. Orie Rout    Allergies  Allergen Reactions  . Doxycycline Other (See Comments)  . Macrodantin [Nitrofurantoin] Nausea And Vomiting  . Sulfamethoxazole-Trimethoprim Rash    REACTION: Rash after completing course of Septra    Allergies as of 12/18/2016      Reactions   Doxycycline Other (See Comments)   Macrodantin  [nitrofurantoin] Nausea And Vomiting   Sulfamethoxazole-trimethoprim Rash   REACTION: Rash after completing course of Septra      Medication List        Accurate as of 12/18/16 11:59 PM. Always use your most recent med list.          aspirin 81 MG tablet Take 81 mg by mouth daily.   calcium carbonate 1500 (600 Ca) MG Tabs tablet Commonly known as:  OSCAL Take 600 mg of elemental calcium by mouth daily.   cholecalciferol 1000 units tablet Commonly known as:  VITAMIN D Take 1,000 Units by mouth daily.   COD LIVER OIL PLUS VITAMIN C Chew Chew 1 tablet by mouth daily.   docusate sodium 250 MG capsule Commonly known as:  COLACE Take 250 mg by mouth daily.   gabapentin 300 MG capsule Commonly known as:  NEURONTIN Take one capsule by mouth every morning and 3 capsules by mouth at evening   Glucosamine-Chondroitin 750-600 MG Tabs Take 1 tablet by mouth. Take one tablet daily   hydrochlorothiazide 25 MG tablet Commonly known as:  HYDRODIURIL TAKE 1 TABLET BY MOUTH EVERY DAY   hydroxyurea 500 MG capsule Commonly known as:  HYDREA TAKE 2 CAPSULES (1,000 MG TOTAL) BY MOUTH DAILY. MAY TAKE WITH FOOD TO MINIMIZE GI SIDE EFFECTS.   ibuprofen 200 MG tablet Commonly known as:  ADVIL,MOTRIN Take 200 mg by mouth as needed.   multivitamin with minerals tablet Take 1 tablet by mouth daily.   omeprazole 20 MG capsule Commonly known as:  PRILOSEC Take 20 mg by mouth daily.   polyethylene glycol packet Commonly known as:  MIRALAX / GLYCOLAX Take 17 g by mouth daily.   pyridOXINE 100 MG tablet Commonly known as:  VITAMIN B-6 Take 100 mg by mouth daily.   solifenacin 10 MG tablet Commonly known as:  VESICARE Take 10 mg by mouth daily.       Review of Systems:  Review of Systems  Constitutional: Negative for activity change, appetite change, chills, diaphoresis, fatigue and fever.  HENT: Positive for hearing loss. Negative for congestion, trouble swallowing and voice  change.   Eyes: Negative for visual disturbance.  Respiratory: Negative for cough, choking and shortness of breath.   Cardiovascular: Positive for leg swelling.  Gastrointestinal: Negative for abdominal distention, abdominal pain, constipation, diarrhea, nausea and vomiting.  Endocrine: Negative for cold intolerance.  Genitourinary: Negative for difficulty urinating, dysuria, frequency and urgency.  Musculoskeletal: Negative for arthralgias, back pain and gait problem.  Neurological: Negative for tremors, weakness, numbness and headaches.       Hx of restless legs.   Psychiatric/Behavioral: Positive for sleep disturbance. Negative for agitation, behavioral problems, confusion and hallucinations. The patient is not nervous/anxious.     Health Maintenance  Topic Date Due  . TETANUS/TDAP  04/11/2019  . INFLUENZA VACCINE  Completed  . DEXA SCAN  Completed  . PNA vac Low Risk Adult  Completed    Physical Exam: Vitals:   12/18/16 1334  BP: 132/70  Pulse: 77  Resp: 18  Temp: 98.4 F (36.9 C)  SpO2: 97%  Weight: 136 lb (61.7 kg)  Height: 5\' 3"  (1.6 m)   Body mass index is 24.09 kg/m. Physical Exam  Constitutional: She is oriented to person, place, and time. She appears well-developed and well-nourished.  HENT:  Head: Normocephalic and atraumatic.  Eyes: Conjunctivae and EOM are normal. Pupils are equal, round, and reactive to light.  Neck: Normal range of motion. Neck supple. No JVD present. No thyromegaly present.  Cardiovascular: Normal rate, regular rhythm and normal heart sounds.  No murmur heard. Pulmonary/Chest: Effort normal and breath sounds normal. She has no wheezes. She has no rales.  Abdominal: Soft. Bowel sounds are normal. She exhibits no distension. There is no tenderness. There is no rebound.  Musculoskeletal: Normal range of motion. She exhibits edema. She exhibits no tenderness.  Left leg trace edema  Neurological: She is alert and oriented to person, place,  and time. She has normal reflexes. She exhibits normal muscle tone. Coordination normal.  Skin: Skin is warm and dry. No rash noted. No erythema. No pallor.  Psychiatric: She has a normal mood and affect. Her behavior is normal. Judgment and thought content normal.    Labs reviewed: Basic Metabolic Panel: Recent Labs    12/09/16 0720  NA 136  K 3.9  CL 99  CO2 30  GLUCOSE 83  BUN 14  CREATININE 0.80  CALCIUM 9.1  TSH 3.97   Liver Function Tests: Recent Labs    12/09/16 0720  AST 21  ALT 25  BILITOT 0.5  PROT 6.3   No results for input(s): LIPASE, AMYLASE in the last 8760 hours. No results for input(s): AMMONIA in the last 8760 hours. CBC: Recent Labs    03/13/16 12/09/16 0720  WBC 3.9 3.3*  HGB 12.2 12.2  HCT 34* 34.0*  MCV  --  112.2*  PLT 398 441*   Lipid Panel: No results for input(s): CHOL, HDL, LDLCALC, TRIG, CHOLHDL, LDLDIRECT in the last 8760 hours. No results found for: HGBA1C  Procedures since last visit: No results found.  Assessment/Plan  Insomnia improved with sleep hygiene, no longer taking Lunesta, she occasionally stays up to 2 am but better than 4am prior. She takes short 30 minutes nap to make up her rest.    Essential thrombocythemia ([place]) Chronic leukopenia, macrocytic anemia, secondary to chronic Hydrea use. F/u oncology. 12/12/16 wbc 3.3, Hgb 12.3, plt 441  Bladder cystocele urology for urinary frequency, taking Vesicare 10mg  qd,  1-2x per night  Restless leg syndrome Restless legs, stable on Gabapentin.     Essential hypertension, benign Controlled, continue HCTZ 25 mg po daily.    Labs/tests ordered: CBC CMP lipid panel TSH prior to the next appointment  Next appt:  A year  Time spend 25 minutes.

## 2016-12-18 NOTE — Assessment & Plan Note (Signed)
improved with sleep hygiene, no longer taking Lunesta, she occasionally stays up to 2 am but better than 4am prior. She takes short 30 minutes nap to make up her rest.

## 2016-12-18 NOTE — Assessment & Plan Note (Signed)
urology for urinary frequency, taking Vesicare 10mg  qd,  1-2x per night

## 2016-12-18 NOTE — Patient Instructions (Signed)
CBC CMP lipid panel TSH prior to the next appointment. Next appt:  A year

## 2016-12-18 NOTE — Assessment & Plan Note (Signed)
Restless legs, stable on Gabapentin.

## 2016-12-18 NOTE — Assessment & Plan Note (Signed)
Chronic leukopenia, macrocytic anemia, secondary to chronic Hydrea use. F/u oncology. 12/12/16 wbc 3.3, Hgb 12.3, plt 441

## 2016-12-19 ENCOUNTER — Telehealth: Payer: Self-pay | Admitting: *Deleted

## 2016-12-19 ENCOUNTER — Other Ambulatory Visit: Payer: Self-pay | Admitting: Oncology

## 2016-12-19 DIAGNOSIS — D473 Essential (hemorrhagic) thrombocythemia: Secondary | ICD-10-CM

## 2016-12-19 NOTE — Telephone Encounter (Signed)
Rxs for labs to be drawn Jan/March/May at [facility] faxed per Dr Beryle Beams.

## 2016-12-23 ENCOUNTER — Non-Acute Institutional Stay: Payer: Medicare Other | Admitting: Internal Medicine

## 2016-12-23 ENCOUNTER — Encounter: Payer: Self-pay | Admitting: Internal Medicine

## 2016-12-23 VITALS — BP 130/72 | HR 71 | Temp 98.1°F | Resp 16 | Ht 63.0 in | Wt 137.2 lb

## 2016-12-23 DIAGNOSIS — N6312 Unspecified lump in the right breast, upper inner quadrant: Secondary | ICD-10-CM

## 2016-12-23 NOTE — Progress Notes (Signed)
[facility]  Provider: [name] MD   Location:  [place] of Service:  Clinic (12)  PCP: [name], MD Patient Care Team: [name], MD as PCP - General (Internal Medicine) Jill Belt, MD as Consulting Physician (Oncology) [name], MD as Consulting Physician (Dermatology) [name], MD as Consulting Physician (Sports Medicine) [name], MD (Inactive) as Consulting Physician [facility])  Extended Emergency Contact Information Primary Emergency Contact: [facility] Address: [place] [address] [facility] of [place] Phone: [phone] Mobile Phone: [phone] Relation: Son   Goals of Care: Advanced Directive information Advanced Directives 06/20/2016  Does Patient Have a Medical Advance Directive? Yes  Type of Advance Directive [place]  Does patient want to make changes to medical advance directive? -  Copy of [facility] in Chart? Yes  Would patient like information on creating a medical advance directive? -      Chief Complaint  Patient presents with  . Acute Visit    Friday night patient was laying down reading a book when she lifted her arm i felt a lump. No pain unless you touch it.   . Medication Refill    No refills needed at this time    HPI: Patient is a 83 y.o. female seen today for acute visit. She felt a lump on her right breast 5 days back accidentally while lying down reading a book. She felt a hard lump. Denies any drainage. Appeared a little bruised. Sore to touch.denies any other lump/ mass. Denies any breast mass in past. Mentions having cyst to her right breast in past that was aspirated. Denies any fever or chills. Mammogram 2013 was normal.   Past Medical History:  Diagnosis Date  . Bladder cystocele 05/21/2010  . Deglutition disorder 08/17/2012   Single episode of choking/aspiration;  preceded by trouble swallowing saliva (thin liquids).  For SLP evaluation. Aug 17, 2012.    [name] Disease of blood and blood forming organ 01/05/2007   Qualifier: Diagnosis of  By: Oneida Alar MD, KARL    . Diverticulitis   . DIVERTICULOSIS OF COLON 03/05/2006   Qualifier: Diagnosis of  By: Samara Snide    . DIZZINESS 01/18/2010   Qualifier: Diagnosis of  By: Lindell Noe MD, Jeneen Rinks    . Essential hypertension, benign 02/22/2008   Qualifier: Diagnosis of  By: Lindell Noe MD, Jeneen Rinks     . Essential thrombocythemia ([place]) 01/31/11  . Family history of Alzheimer's disease 11/11/2013   Patient with family history of Alzheimers disease. She herself is very highly functional.  Plans for future visit with MMSE as primary function of the visit.    [name] GERD (gastroesophageal reflux disease) 08/16/2010  . HALLUX VALGUS, ACQUIRED 04/10/2009   Qualifier: Diagnosis of  By: [name] CMA,, [place]    . HYPERTRIGLYCERIDEMIA 01/05/2007   Qualifier: Diagnosis of  By: Oneida Alar MD, KARL    . Insomnia 02/10/2014  . Left-sided chest wall pain 12/19/2011    L sided chest wall pain follows a dermatomal distribution and raises the possibility of thoracic radiculopathic pain, which would be expected to improve with gabapentin as well. We discussed this at length and she will call to make me aware if this does not get better. Rib Myers/girdle in the meantime; may consider rib films or CXR if not improving.     . Metatarsalgia of right foot 11/28/2013  Referral to [facility]   . Myeloma ([place])    prolefica  . Osteopenia   . Restless leg syndrome 06/09/2011  . Restless legs syndrome (RLS)   . URINARY INCONTINENCE, URGE, MILD 11/27/2009   Qualifier: Diagnosis of  By: Zebedee Iba NP, Manuela Schwartz    . Vitamin D deficiency 08/16/2010   Past Surgical History:  Procedure Laterality Date  . BIOPSY BREAST Left 1998   Dr. Rebekah Chesterfield  . CATARACT EXTRACTION  2010   Dr. [name]  . CHOLECYSTECTOMY  1992  . COLONOSCOPY  2008  . MOHS SURGERY  2012   on face [facility]    . NAILBED REPAIR  12/05/2010   Procedure: NAILBED REPAIR;  Surgeon: Cammie Sickle., MD;  Location: [place];  Service: Orthopedics;  Laterality: Right;  full thickness nail biopsy right hallux  . torn tenden  2012   hand Dr. Orie Rout    reports that she quit smoking about 58 years ago. she has never used smokeless tobacco. She reports that she drinks alcohol. She reports that she does not use drugs. Social History   Socioeconomic History  . Marital status: Widowed    Spouse name: Not on file  . Number of children: 2  . Years of education: bachelors  . Highest education level: Not on file  Social Needs  . Financial resource strain: Not on file  . Food insecurity - worry: Not on file  . Food insecurity - inability: Not on file  . Transportation needs - medical: Not on file  . Transportation needs - non-medical: Not on file  Occupational History  . Occupation: Tree surgeon: UNEMPLOYED  . Occupation: Retired- Paramedic  Tobacco Use  . Smoking status: Former Smoker    Last attempt to quit: 12/02/1958    Years since quitting: 58.0  . Smokeless tobacco: Never Used  Substance and Sexual Activity  . Alcohol use: Yes    Alcohol/week: 0.0 oz    Comment: Wine rarely.  . Drug use: No  . Sexual activity: No  Other Topics Concern  . Not on file  Social History Narrative   Health Care POA: [name]- Son   Emergency Contact: neighbor, [name] [phone]   End of Life Plan: POA, Living Will   Widow   Lives at [facility] since about 2013.   Any pets: none   Diet: Pt has a varied diet of protein, starch, vegetables   Exercise: Pt exercises 3x week for 1 hour with group.   Seatbelts: Pt reports wearing seatbelt when in vehicles.    Sun Exposure/Protection: pt does not wear sun protection   Hobbies: opera, symphony, reading, walking       Functional Status Survey:    Family History  Problem Relation Age of Onset  .  Heart disease Mother   . Tuberculosis Father   . Heart disease Sister   . Heart disease Brother   . Alzheimer's disease Brother   . Alzheimer's disease Brother     Health Maintenance  Topic Date Due  . TETANUS/TDAP  04/11/2019  . INFLUENZA VACCINE  Completed  . DEXA SCAN  Completed  . PNA vac Low Risk Adult  Completed    Allergies  Allergen Reactions  . Doxycycline Other (See Comments)  . Macrodantin [Nitrofurantoin] Nausea And Vomiting  . Sulfamethoxazole-Trimethoprim Rash    REACTION: Rash after completing course of Septra    Outpatient Encounter Medications as of 12/23/2016  Medication Sig  .  aspirin 81 MG tablet Take 81 mg by mouth daily.    . calcium carbonate (OSCAL) 1500 (600 Ca) MG TABS tablet Take 600 mg of elemental calcium by mouth daily.  . cholecalciferol (VITAMIN D) 1000 UNITS tablet Take 1,000 Units by mouth daily.  [name] docusate sodium (COLACE) 250 MG capsule Take 250 mg by mouth daily.  [name] gabapentin (NEURONTIN) 300 MG capsule Take one capsule by mouth every morning and 3 capsules by mouth at evening  . Glucosamine-Chondroitin 750-600 MG TABS Take 1 tablet by mouth. Take one tablet daily  . hydrochlorothiazide (HYDRODIURIL) 25 MG tablet TAKE 1 TABLET BY MOUTH EVERY DAY  . hydroxyurea (HYDREA) 500 MG capsule TAKE 2 CAPSULES (1,000 MG TOTAL) BY MOUTH DAILY. MAY TAKE WITH FOOD TO MINIMIZE GI SIDE EFFECTS.  [name] ibuprofen (ADVIL,MOTRIN) 200 MG tablet Take 200 mg by mouth as needed.  . Multiple Vitamins-Minerals (MULTIVITAMIN WITH MINERALS) tablet Take 1 tablet by mouth daily.  [name] omeprazole (PRILOSEC) 20 MG capsule Take 20 mg by mouth daily.  . polyethylene glycol (MIRALAX / GLYCOLAX) packet Take 17 g by mouth daily.    [name] pyridOXINE (VITAMIN B-6) 100 MG tablet Take 100 mg by mouth daily.  . solifenacin (VESICARE) 10 MG tablet Take 10 mg by mouth daily.  . Vitamins A D C (COD LIVER OIL PLUS VITAMIN C) CHEW Chew 1 tablet by mouth daily.   No facility-administered  encounter medications on file as of 12/23/2016.     Review of Systems  Constitutional: Negative for chills, diaphoresis, fatigue and fever.  Respiratory: Negative for shortness of breath.   Cardiovascular: Negative for chest pain.  Musculoskeletal:       Denies edema to arm  Skin: Negative for rash.  Neurological: Negative for dizziness and headaches.  Hematological: Negative for adenopathy.    Vitals:   12/23/16 1007  BP: 130/72  Pulse: 71  Resp: 16  Temp: 98.1 F (36.7 C)  TempSrc: Oral  SpO2: 92%  Weight: 137 lb 3.2 oz (62.2 kg)  Height: 5\' 3"  (1.6 m)   Body mass index is 24.3 kg/m.   Wt Readings from Last 3 Encounters:  12/23/16 137 lb 3.2 oz (62.2 kg)  12/18/16 136 lb (61.7 kg)  09/23/16 135 lb 6.4 oz (61.4 kg)   Physical Exam  Constitutional: She is oriented to person, place, and time. She appears well-developed and well-nourished.  HENT:  Head: Normocephalic and atraumatic.  Neck: Normal range of motion. Neck supple.  Pulmonary/Chest: Effort normal. No respiratory distress. Right breast exhibits mass and tenderness. Right breast exhibits no inverted nipple, no nipple discharge and no skin change. Left breast exhibits no inverted nipple, no mass, no nipple discharge, no skin change and no tenderness.    Abdominal: Soft. Bowel sounds are normal. There is no tenderness.  Lymphadenopathy:    She has no cervical adenopathy.  Neurological: She is alert and oriented to person, place, and time.  Skin: Skin is warm and dry. She is not diaphoretic.    Labs reviewed: Basic Metabolic Panel: Recent Labs    12/09/16 0720  NA 136  K 3.9  CL 99  CO2 30  GLUCOSE 83  BUN 14  CREATININE 0.80  CALCIUM 9.1   Liver Function Tests: Recent Labs    12/09/16 0720  AST 21  ALT 25  BILITOT 0.5  PROT 6.3   No results for input(s): LIPASE, AMYLASE in the last 8760 hours. No results for input(s): AMMONIA in the last 8760 hours. CBC:  Recent Labs    03/13/16  12/09/16 0720  WBC 3.9 3.3*  HGB 12.2 12.2  HCT 34* 34.0*  MCV  --  112.2*  PLT 398 441*   Cardiac Enzymes: No results for input(s): CKTOTAL, CKMB, CKMBINDEX, TROPONINI in the last 8760 hours. BNP: Invalid input(s): POCBNP No results found for: HGBA1C Lab Results  Component Value Date   TSH 3.97 12/09/2016   No results found for: VITAMINB12 No results found for: FOLATE Lab Results  Component Value Date   IRON 90 05/22/2015   TIBC 245 (L) 05/22/2015   FERRITIN 164 (H) 05/22/2015    Lipid Panel: No results for input(s): CHOL, HDL, LDLCALC, TRIG, CHOLHDL, LDLDIRECT in the last 8760 hours. No results found for: HGBA1C  Procedures since last visit: No results found.  Assessment/Plan  1. Lump in upper inner quadrant of right breast New lump. Mammogram 2013 normal. Known history of breast cyst in the past. Will obtain ultrasound of right breast to evaluate further to assess for cyst vs mass and need for biopsy vs aspiration.  - US BREAST COMPLETE UNI RIGHT INC AXILLA; Future    Labs/tests ordered:  As above    [name], MD Internal Medicine [place], [place] [address] Cell Phone (Monday-Friday 8 am - 5 pm): [phone] On Call: [phone] and follow prompts after 5 pm and on weekends Office Phone: [phone] Office Fax: [phone]

## 2016-12-25 ENCOUNTER — Other Ambulatory Visit: Payer: Self-pay | Admitting: Internal Medicine

## 2016-12-25 DIAGNOSIS — N6312 Unspecified lump in the right breast, upper inner quadrant: Secondary | ICD-10-CM

## 2016-12-26 ENCOUNTER — Other Ambulatory Visit: Payer: Self-pay | Admitting: Internal Medicine

## 2017-01-01 ENCOUNTER — Other Ambulatory Visit: Payer: Self-pay | Admitting: Internal Medicine

## 2017-01-01 ENCOUNTER — Ambulatory Visit
Admission: RE | Admit: 2017-01-01 | Discharge: 2017-01-01 | Disposition: A | Discharge status: Home / Self Care | Payer: Medicare Other | Source: Ambulatory Visit | Attending: Internal Medicine | Admitting: Internal Medicine

## 2017-01-01 DIAGNOSIS — N6320 Unspecified lump in the left breast, unspecified quadrant: Secondary | ICD-10-CM

## 2017-01-01 DIAGNOSIS — N6312 Unspecified lump in the right breast, upper inner quadrant: Secondary | ICD-10-CM

## 2017-01-07 ENCOUNTER — Ambulatory Visit
Admission: RE | Admit: 2017-01-07 | Discharge: 2017-01-07 | Disposition: A | Discharge status: Home / Self Care | Payer: Medicare Other | Source: Ambulatory Visit | Attending: Internal Medicine | Admitting: Internal Medicine

## 2017-01-07 ENCOUNTER — Other Ambulatory Visit: Payer: Self-pay | Admitting: Internal Medicine

## 2017-01-07 DIAGNOSIS — N6320 Unspecified lump in the left breast, unspecified quadrant: Secondary | ICD-10-CM

## 2017-01-07 DIAGNOSIS — N6312 Unspecified lump in the right breast, upper inner quadrant: Secondary | ICD-10-CM

## 2017-01-12 ENCOUNTER — Telehealth: Payer: Self-pay | Admitting: Hematology

## 2017-01-12 NOTE — Telephone Encounter (Signed)
Confirmed afternoon [facility] appointment with patient for 01/21/17, packet will be e-mailed at patients request

## 2017-01-13 ENCOUNTER — Encounter: Payer: Self-pay | Admitting: *Deleted

## 2017-01-14 ENCOUNTER — Telehealth: Payer: Self-pay | Admitting: Nurse Practitioner

## 2017-01-15 NOTE — Telephone Encounter (Signed)
Multiple phone calls to [facility] attempted, not answered, staff ([place] nurse Berenice Bouton RN) at [facility] found out  the patient is visiting her sister in [place].

## 2017-01-20 ENCOUNTER — Other Ambulatory Visit: Payer: Self-pay | Admitting: *Deleted

## 2017-01-20 DIAGNOSIS — D473 Essential (hemorrhagic) thrombocythemia: Secondary | ICD-10-CM

## 2017-01-20 DIAGNOSIS — C50211 Malignant neoplasm of upper-inner quadrant of right female breast: Secondary | ICD-10-CM | POA: Insufficient documentation

## 2017-01-20 DIAGNOSIS — Z170 Estrogen receptor positive status [ER+]: Principal | ICD-10-CM

## 2017-01-20 DIAGNOSIS — G2581 Restless legs syndrome: Secondary | ICD-10-CM

## 2017-01-20 DIAGNOSIS — C50412 Malignant neoplasm of upper-outer quadrant of left female breast: Secondary | ICD-10-CM | POA: Insufficient documentation

## 2017-01-20 NOTE — Progress Notes (Signed)
CMP

## 2017-01-20 NOTE — Progress Notes (Signed)
Radiation Oncology         [phone] ________________________________  Initial outpatient Consultation  Name: Jill Myers MRN: [id number]  Date: 01/21/2017  DOB: 1930-11-24  TW:SFKCLE, Mahima, MD  Jovita Kussmaul, MD   REFERRING PHYSICIAN: Autumn Messing III, MD  DIAGNOSIS: The primary encounter diagnosis was Malignant neoplasm of upper-inner quadrant of right breast in female, estrogen receptor positive ([place]). A diagnosis of Malignant neoplasm of upper-outer quadrant of left breast in female, estrogen receptor positive ([place]) was also pertinent to this visit.  HISTORY OF PRESENT ILLNESS::Jill Myers is a 84 y.o. female who is here because of newly diagnosed malignant neoplasm of the right and left breast. The patient presents to the clinic today accompanied by her friend Katharine Look.   Pt first felt a mass in her breast 2 weeks ago. She has noticed no other change, she has no symptoms of fatigue, decreased appetite, or arthritis. Her diagnostic mammogram on 01/01/17 revealing a mass in both of her breasts. Her initial biopsy on 01/07/17 revealed invasive ductal carcinoma of the right breast and invasive mammary carcinoma in the left breast.   Right Breast: Prognostic indicators significant for: ER, 95% positive and PR, 50% positive, both with strong staining intensity. Proliferation marker Ki67 at 30. HER2 negative.  Left Breast: Prognostic indicators significant for: ER, 100% positive and PR, 100% positive, both with strong staining intensity. Proliferation marker Ki67 at 10. HER2 negative.  In the past the patient was diagnosed with significant comorbidities of essential Thrombocytopenia. She is treated with hydrea by Dr. Beryle Beams. She has no FHx of CA. Pt used tobacco as a teenager but does not currently. She drinks alcohol occassionally.   Socially, she lives in a retirement home alone and exercises almost daily through programs there.   GYN HISTORY  Menarchal: 14 LMP: late  84s Contraceptive: HRT: Yes, for 3 years following menopause  GP: 3/2, first birth at 50    PREVIOUS RADIATION THERAPY: No  PAST MEDICAL HISTORY:  has a past medical history of Bladder cystocele (05/21/2010), Deglutition disorder (08/17/2012), Disease of blood and blood forming organ (01/05/2007), Diverticulitis, DIVERTICULOSIS OF COLON (03/05/2006), DIZZINESS (01/18/2010), Essential hypertension, benign (02/22/2008), Essential thrombocythemia ([place]) (01/31/11), Family history of Alzheimer's disease (11/11/2013), GERD (gastroesophageal reflux disease) (08/16/2010), HALLUX VALGUS, ACQUIRED (04/10/2009), Hypertension, HYPERTRIGLYCERIDEMIA (01/05/2007), Insomnia (02/10/2014), Left-sided chest wall pain (12/19/2011), Metatarsalgia of right foot (11/28/2013), Myeloma ([place]), Osteopenia, Restless leg syndrome (06/09/2011), Restless legs syndrome (RLS), URINARY INCONTINENCE, URGE, MILD (11/27/2009), and Vitamin D deficiency (08/16/2010).    PAST SURGICAL HISTORY: Past Surgical History:  Procedure Laterality Date  . BIOPSY BREAST Left 1998   Dr. Rebekah Chesterfield  . BREAST EXCISIONAL BIOPSY    . CATARACT EXTRACTION  2010   Dr. [name]  . CHOLECYSTECTOMY  1992  . COLONOSCOPY  2008  . MOHS SURGERY  2012   on face [facility]  . NAILBED REPAIR  12/05/2010   Procedure: NAILBED REPAIR;  Surgeon: Cammie Sickle., MD;  Location: [facility];  Service: Orthopedics;  Laterality: Right;  full thickness nail biopsy right hallux  . torn tenden  2012   hand Dr. Orie Rout    FAMILY HISTORY: family history includes Alzheimer's disease in her brother and brother; Heart disease in her brother, mother, and sister; Tuberculosis in her father.  SOCIAL HISTORY:  reports that she quit smoking about 58 years ago. She quit after 3.00 years of use. she has never used smokeless tobacco. She reports that she drinks alcohol. She  reports that she does not use drugs.  ALLERGIES: Doxycycline; Macrodantin  [nitrofurantoin]; and Sulfamethoxazole-trimethoprim  MEDICATIONS:  Current Outpatient Medications  Medication Sig Dispense Refill  . anastrozole (ARIMIDEX) 1 MG tablet Take 1 tablet (1 mg total) by mouth daily. 30 tablet 3  . aspirin 81 MG tablet Take 81 mg by mouth daily.      . calcium carbonate (OSCAL) 1500 (600 Ca) MG TABS tablet Take 600 mg of elemental calcium by mouth daily.    . cholecalciferol (VITAMIN D) 1000 UNITS tablet Take 1,000 Units by mouth daily.    [name] docusate sodium (COLACE) 250 MG capsule Take 250 mg by mouth daily.    [name] gabapentin (NEURONTIN) 300 MG capsule TAKE ONE CAPSULE BY MOUTH EVERY MORNING 2 CAPS AT NOON AND 3 CAPS AT EVENING 180 capsule 5  . Glucosamine-Chondroitin 750-600 MG TABS Take 1 tablet by mouth. Take one tablet daily    . hydrochlorothiazide (HYDRODIURIL) 25 MG tablet TAKE 1 TABLET BY MOUTH EVERY DAY 90 tablet 1  . hydroxyurea (HYDREA) 500 MG capsule TAKE 2 CAPSULES (1,000 MG TOTAL) BY MOUTH DAILY. MAY TAKE WITH FOOD TO MINIMIZE GI SIDE EFFECTS. 60 capsule 2  . ibuprofen (ADVIL,MOTRIN) 200 MG tablet Take 200 mg by mouth as needed.    . Multiple Vitamins-Minerals (MULTIVITAMIN WITH MINERALS) tablet Take 1 tablet by mouth daily.    [name] omeprazole (PRILOSEC) 20 MG capsule Take 20 mg by mouth daily.    . polyethylene glycol (MIRALAX / GLYCOLAX) packet Take 17 g by mouth daily.      [name] pyridOXINE (VITAMIN B-6) 100 MG tablet Take 100 mg by mouth daily.    . solifenacin (VESICARE) 10 MG tablet Take 10 mg by mouth daily.    . Vitamins A D C (COD LIVER OIL PLUS VITAMIN C) CHEW Chew 1 tablet by mouth daily.     No current facility-administered medications for this encounter.     REVIEW OF SYSTEMS: REVIEW OF SYSTEMS: A 10+ POINT REVIEW OF SYSTEMS WAS OBTAINED including neurology, dermatology, psychiatry, cardiac, respiratory, lymph, extremities, GI, GU, musculoskeletal, constitutional, reproductive, HEENT. All pertinent positives are noted in the HPI. All others are  negative.   PHYSICAL EXAM:  Vitals:   01/21/17 1251  BP: (!) 146/78  Pulse: 77  Resp: 18  Temp: 98.2 F (36.8 C)  SpO2: 94%      Filed Weights   01/21/17 1251  Weight: 136 lb 8 oz (61.9 kg)    General: Alert and oriented, in no acute distress HEENT: Head is normocephalic. Extraocular movements are intact. Oropharynx is clear. Neck: Neck is supple, no palpable cervical or supraclavicular lymphadenopathy. Heart: Regular in rate and rhythm with no murmurs, rubs, or gallops. Chest: Clear to auscultation bilaterally, with no rhonchi, wheezes, or rales. Right breast: Extensive bruising in the upper inner quadrant. There is a palpable mass measuring approximately 2.5 cm in the upper inner quadrant. No nipple discharge or bleeding Left breast: patient has bruising in the upper outer quadrant as well as in the nipple areolar complex area from her recent biopsy. No palpable mass nipple discharge or bleeding Abdomen: Soft, nontender, nondistended, with no rigidity or guarding. Extremities: No cyanosis or edema. Lymphatics: see Neck Exam Skin: No concerning lesions. Musculoskeletal: symmetric strength and muscle tone throughout. Neurologic: Cranial nerves II through XII are grossly intact. No obvious focalities. Speech is fluent. Coordination is intact. Psychiatric: Judgment and insight are intact. Affect is appropriate.    ECOG = 1  0 -  Asymptomatic (Fully active, able to carry on all predisease activities without restriction)  1 - Symptomatic but completely ambulatory (Restricted in physically strenuous activity but ambulatory and able to carry out work of a light or sedentary nature. For example, light housework, office work)  2 - Symptomatic, <50% in bed during the day (Ambulatory and capable of all self care but unable to carry out any work activities. Up and about more than 50% of waking hours)  3 - Symptomatic, >50% in bed, but not bedbound (Capable of only limited self-care,  confined to bed or chair 50% or more of waking hours)  4 - Bedbound (Completely disabled. Cannot carry on any self-care. Totally confined to bed or chair)  5 - Death   Eustace Pen MM, Creech RH, Tormey DC, et al. [phone]). "Toxicity and response criteria of the [facility]". [place] Oncol. 5 (6): 649-55  LABORATORY DATA:  Lab Results  Component Value Date   WBC 4.2 01/21/2017   HGB 12.2 12/09/2016   HCT 34.9 01/21/2017   MCV 120.3 (H) 01/21/2017   PLT 437 (H) 01/21/2017   NEUTROABS 2.6 01/21/2017   Lab Results  Component Value Date   NA 137 01/21/2017   K 3.4 01/21/2017   CL 99 01/21/2017   CO2 29 01/21/2017   GLUCOSE 100 01/21/2017   CREATININE 0.80 12/09/2016   CALCIUM 8.6 01/21/2017      RADIOGRAPHY: US Breast Ltd Uni Left Inc Axilla  Result Date: 01/01/2017 CLINICAL DATA:  Mass felt by the patient in the upper inner right breast for the past week. EXAM: 2D DIGITAL DIAGNOSTIC BILATERAL MAMMOGRAM WITH CAD AND ADJUNCT TOMO ULTRASOUND BILATERAL BREAST COMPARISON:  Previous exam(s). ACR Breast Density Category c: The breast tissue is heterogeneously dense, which may obscure small masses. FINDINGS: 2D and 3D tomographic images of the right breast demonstrate an oval mass with irregular margins in the posterior aspect of the upper inner quadrant of the breast. This corresponds to the mass felt by the patient, marked with a metallic marker. There is a small irregular mass with spiculated margins in the posterior aspect of the upper-outer left breast in approximately the 2 to 3 o'clock position. No abnormal axillary lymph nodes are visible mammographically. Mammographic images were processed with CAD. On physical exam, there is an approximately 2.5 cm oval, firm, mobile palpable mass in the 2 o'clock position of the right breast, 6 cm from the nipple. This is protruding anteriorly on visible inspection of the breast with the patient supine. No mass is palpable in the  outer left breast. There are no palpable axillary lymph nodes on either side. Targeted ultrasound is performed, showing a 2.0 x 1.7 x 1.5 cm irregular, hypoechoic mass in the 2 o'clock position of the right breast, 6 cm from the nipple. This exhibits minimal posterior acoustical shadowing. There is a 0.7 x 0.7 x 0.5 cm irregular, vertically oriented hypoechoic mass exhibiting posterior acoustical shadowing in the 2 o'clock position of the left breast, 4 cm from the nipple. Ultrasound of the axillae demonstrated normal appearing axillary lymph nodes bilaterally. IMPRESSION: 1. 2.0 cm mass in the 2 o'clock position of the right breast with imaging features highly suspicious for malignancy. 2. 0.7 cm mass in the 2 o'clock position of the left breast with imaging features highly suspicious for malignancy. 3. No abnormal appearing axillary lymph nodes on either side. RECOMMENDATION: Ultrasound-guided core needle biopsies of the 2.0 cm mass in the 2 o'clock position of the right  breast and the 0.7 cm mass in the 2 o'clock position of the left breast. This has been discussed with the patient and scheduled at 1:45 p.m. on 01/07/2017. I have discussed the findings and recommendations with the patient. Results were also provided in writing at the conclusion of the visit. If applicable, a reminder letter will be sent to the patient regarding the next appointment. BI-RADS CATEGORY  5: Highly suggestive of malignancy. Electronically Signed   By: Claudie Revering M.D.   On: 01/01/2017 10:59   US Breast Ltd Uni Right Inc Axilla  Result Date: 01/01/2017 CLINICAL DATA:  Mass felt by the patient in the upper inner right breast for the past week. EXAM: 2D DIGITAL DIAGNOSTIC BILATERAL MAMMOGRAM WITH CAD AND ADJUNCT TOMO ULTRASOUND BILATERAL BREAST COMPARISON:  Previous exam(s). ACR Breast Density Category c: The breast tissue is heterogeneously dense, which may obscure small masses. FINDINGS: 2D and 3D tomographic images of the right  breast demonstrate an oval mass with irregular margins in the posterior aspect of the upper inner quadrant of the breast. This corresponds to the mass felt by the patient, marked with a metallic marker. There is a small irregular mass with spiculated margins in the posterior aspect of the upper-outer left breast in approximately the 2 to 3 o'clock position. No abnormal axillary lymph nodes are visible mammographically. Mammographic images were processed with CAD. On physical exam, there is an approximately 2.5 cm oval, firm, mobile palpable mass in the 2 o'clock position of the right breast, 6 cm from the nipple. This is protruding anteriorly on visible inspection of the breast with the patient supine. No mass is palpable in the outer left breast. There are no palpable axillary lymph nodes on either side. Targeted ultrasound is performed, showing a 2.0 x 1.7 x 1.5 cm irregular, hypoechoic mass in the 2 o'clock position of the right breast, 6 cm from the nipple. This exhibits minimal posterior acoustical shadowing. There is a 0.7 x 0.7 x 0.5 cm irregular, vertically oriented hypoechoic mass exhibiting posterior acoustical shadowing in the 2 o'clock position of the left breast, 4 cm from the nipple. Ultrasound of the axillae demonstrated normal appearing axillary lymph nodes bilaterally. IMPRESSION: 1. 2.0 cm mass in the 2 o'clock position of the right breast with imaging features highly suspicious for malignancy. 2. 0.7 cm mass in the 2 o'clock position of the left breast with imaging features highly suspicious for malignancy. 3. No abnormal appearing axillary lymph nodes on either side. RECOMMENDATION: Ultrasound-guided core needle biopsies of the 2.0 cm mass in the 2 o'clock position of the right breast and the 0.7 cm mass in the 2 o'clock position of the left breast. This has been discussed with the patient and scheduled at 1:45 p.m. on 01/07/2017. I have discussed the findings and recommendations with the  patient. Results were also provided in writing at the conclusion of the visit. If applicable, a reminder letter will be sent to the patient regarding the next appointment. BI-RADS CATEGORY  5: Highly suggestive of malignancy. Electronically Signed   By: Claudie Revering M.D.   On: 01/01/2017 10:59   Mm Diag Breast Tomo Bilateral  Result Date: 01/01/2017 CLINICAL DATA:  Mass felt by the patient in the upper inner right breast for the past week. EXAM: 2D DIGITAL DIAGNOSTIC BILATERAL MAMMOGRAM WITH CAD AND ADJUNCT TOMO ULTRASOUND BILATERAL BREAST COMPARISON:  Previous exam(s). ACR Breast Density Category c: The breast tissue is heterogeneously dense, which may obscure small masses. FINDINGS: 2D and  3D tomographic images of the right breast demonstrate an oval mass with irregular margins in the posterior aspect of the upper inner quadrant of the breast. This corresponds to the mass felt by the patient, marked with a metallic marker. There is a small irregular mass with spiculated margins in the posterior aspect of the upper-outer left breast in approximately the 2 to 3 o'clock position. No abnormal axillary lymph nodes are visible mammographically. Mammographic images were processed with CAD. On physical exam, there is an approximately 2.5 cm oval, firm, mobile palpable mass in the 2 o'clock position of the right breast, 6 cm from the nipple. This is protruding anteriorly on visible inspection of the breast with the patient supine. No mass is palpable in the outer left breast. There are no palpable axillary lymph nodes on either side. Targeted ultrasound is performed, showing a 2.0 x 1.7 x 1.5 cm irregular, hypoechoic mass in the 2 o'clock position of the right breast, 6 cm from the nipple. This exhibits minimal posterior acoustical shadowing. There is a 0.7 x 0.7 x 0.5 cm irregular, vertically oriented hypoechoic mass exhibiting posterior acoustical shadowing in the 2 o'clock position of the left breast, 4 cm from the  nipple. Ultrasound of the axillae demonstrated normal appearing axillary lymph nodes bilaterally. IMPRESSION: 1. 2.0 cm mass in the 2 o'clock position of the right breast with imaging features highly suspicious for malignancy. 2. 0.7 cm mass in the 2 o'clock position of the left breast with imaging features highly suspicious for malignancy. 3. No abnormal appearing axillary lymph nodes on either side. RECOMMENDATION: Ultrasound-guided core needle biopsies of the 2.0 cm mass in the 2 o'clock position of the right breast and the 0.7 cm mass in the 2 o'clock position of the left breast. This has been discussed with the patient and scheduled at 1:45 p.m. on 01/07/2017. I have discussed the findings and recommendations with the patient. Results were also provided in writing at the conclusion of the visit. If applicable, a reminder letter will be sent to the patient regarding the next appointment. BI-RADS CATEGORY  5: Highly suggestive of malignancy. Electronically Signed   By: Claudie Revering M.D.   On: 01/01/2017 10:59   Mm Clip Placement Left  Result Date: 01/07/2017 CLINICAL DATA:  Evaluate biopsy marker EXAM: DIAGNOSTIC LEFT MAMMOGRAM POST ULTRASOUND BIOPSY COMPARISON:  Previous exam(s). FINDINGS: Mammographic images were obtained following ultrasound guided biopsy of a left breast mass. The coil shaped clip is located within the biopsied left breast mass. IMPRESSION: Appropriate clip placement as above. Final Assessment: Post Procedure Mammograms for Marker Placement Electronically Signed   By: Dorise Bullion III M.D   On: 01/07/2017 14:46   Mm Clip Placement Right  Result Date: 01/07/2017 CLINICAL DATA:  Evaluate clip placement after biopsy of right-sided mass. EXAM: DIAGNOSTIC RIGHT MAMMOGRAM POST ULTRASOUND BIOPSY COMPARISON:  Previous exam(s). FINDINGS: Mammographic images were obtained following ultrasound guided biopsy of the right-sided mass. The ribbon shaped clip is within the mass. IMPRESSION:  Appropriate clip placement within the right-sided mass. Final Assessment: Post Procedure Mammograms for Marker Placement Electronically Signed   By: Dorise Bullion III M.D   On: 01/07/2017 14:45   [place] Lt Breast Bx W Loc Dev 1st Lesion Img Bx Spec US Guide  Addendum Date: 01/09/2017   ADDENDUM REPORT: 01/08/2017 15:24 ADDENDUM: Pathology revealed GRADE III INVASIVE DUCTAL CARCINOMA of the Right breast, 2:00 o'clock. GRADE II INVASIVE MAMMARY CARCINOMA of the Left breast, 2:00 o'clock. This was found to be  concordant by Dr. Dorise Bullion. Pathology results were discussed with the patient by telephone. The patient reported doing well after the biopsies with tenderness and bruising at the sites. Post biopsy instructions and care were reviewed and questions were answered. The patient was encouraged to call [facility] for any additional concerns. The patient was referred to [facility] on January 21, 2017. Pathology results reported by Terie Purser, RN on 01/08/2017. Electronically Signed   By: Dorise Bullion III M.D   On: 01/08/2017 15:24   Result Date: 01/09/2017 CLINICAL DATA:  Left breast mass EXAM: ULTRASOUND GUIDED LEFT BREAST CORE NEEDLE BIOPSY COMPARISON:  Previous exam(s). FINDINGS: I met with the patient and we discussed the procedure of ultrasound-guided biopsy, including benefits and alternatives. We discussed the high likelihood of a successful procedure. We discussed the risks of the procedure, including infection, bleeding, tissue injury, clip migration, and inadequate sampling. Informed written consent was given. The usual time-out protocol was performed immediately prior to the procedure. Lesion quadrant: Upper-outer Using sterile technique and 1% Lidocaine as local anesthetic, under direct ultrasound visualization, a 12 gauge spring-loaded device was used to perform biopsy of a left breast mass  using a lateral approach. At the conclusion of the procedure a tissue marker clip was deployed into the biopsy cavity. Follow up 2 view mammogram was performed and dictated separately. IMPRESSION: Ultrasound guided biopsy of a left breast mass. No apparent complications. Electronically Signed: By: Dorise Bullion III M.D On: 01/07/2017 14:31   [place] Rt Breast Bx W Loc Dev 1st Lesion Img Bx Spec US Guide  Addendum Date: 01/09/2017   ADDENDUM REPORT: 01/08/2017 15:24 ADDENDUM: Pathology revealed GRADE III INVASIVE DUCTAL CARCINOMA of the Right breast, 2:00 o'clock. GRADE II INVASIVE MAMMARY CARCINOMA of the Left breast, 2:00 o'clock. This was found to be concordant by Dr. Dorise Bullion. Pathology results were discussed with the patient by telephone. The patient reported doing well after the biopsies with tenderness and bruising at the sites. Post biopsy instructions and care were reviewed and questions were answered. The patient was encouraged to call [facility] for any additional concerns. The patient was referred to [facility] on January 21, 2017. Pathology results reported by Terie Purser, RN on 01/08/2017. Electronically Signed   By: Dorise Bullion III M.D   On: 01/08/2017 15:24   Result Date: 01/09/2017 CLINICAL DATA:  Right breast mass EXAM: ULTRASOUND GUIDED RIGHT BREAST CORE NEEDLE BIOPSY COMPARISON:  Previous exam(s). FINDINGS: I met with the patient and we discussed the procedure of ultrasound-guided biopsy, including benefits and alternatives. We discussed the high likelihood of a successful procedure. We discussed the risks of the procedure, including infection, bleeding, tissue injury, clip migration, and inadequate sampling. Informed written consent was given. The usual time-out protocol was performed immediately prior to the procedure. Lesion quadrant: Upper inner Using sterile technique and 1%  Lidocaine as local anesthetic, under direct ultrasound visualization, a 12 gauge spring-loaded device was used to perform biopsy of a right breast mass using a bladder approach. At the conclusion of the procedure a ribbon shaped tissue marker clip was deployed into the biopsy cavity. Follow up 2 view mammogram was performed and dictated separately. IMPRESSION: Ultrasound guided biopsy of a right breast mass. No apparent complications. Electronically Signed: By: Dorise Bullion III M.D On: 01/07/2017 14:30  IMPRESSION: Malignant neoplasm of right breast , Stage cT2, cN0 , ER/PR: postive, HER2 negative Grade 3 Malignant neoplasm of the left breast Stage cT1b, cN0, ER/PR: positive HER2 negative Grade 2 Patient would appear to be a good candidate for breast conservation therapy with radiation therapy directed at both breast areas. Given the patient's advanced age we will not recommend sentinel node procedure for either breast but will encompass portion of the axillary region with high tangents during her breast radiation therapy. Patient would like to delay her surgery until later in February to allow her daughter who lives in [place] to be present for surgery and recovery. In light of this delay the patient will proceed with adjuvant hormonal therapy at this time of the direction of Dr. Burr Medico. I discussed the general course of radiation therapy side effects and potential long-term toxicities of radiation therapy with the patient and her friend. The patient appears to understand and wishes to proceed with planned course of treatment.  She understands that her radiation therapy will take a little longer on the treatment table given treatments to both breast areas.   PLAN: Patient will proceed with initiation of adjuvant hormonal hormonal therapy in light of the delay in surgery. In late February the patient will proceed with bilateral lumpectomies on the direction of Dr. Marlou Starks and then be seen in radiation  oncology for adjuvant radiation to both breasts as part of breast conservation therapy. Patient will continue on adjuvant hormonal therapy for at least 5 years.     ------------------------------------------------  Blair Promise, PhD, MD

## 2017-01-20 NOTE — Progress Notes (Signed)
[place]  Telephone:(336) [phone] Fax:([phone] [place] Note   Patient Care Team: [name], MD as PCP - General (Internal Medicine) Annia Belt, MD as Consulting Physician (Oncology) [name], MD as Consulting Physician (Dermatology) [name], MD as Consulting Physician (Sports Medicine) [name], MD (Inactive) as Consulting Physician (Family Medicine) 01/21/2017  CHIEF COMPLAINTS/PURPOSE OF CONSULTATION:  Malignant neoplasm of the right and left breast  Oncology History   Cancer Staging Malignant neoplasm of upper-inner quadrant of right breast in female, estrogen receptor positive ([place]) Staging form: Breast, AJCC 8th Edition - Clinical stage from 01/07/2017: Stage IA (cT1c, cN0, cM0, G3, ER: Positive, PR: Positive, HER2: Negative) - Signed by Truitt Merle, MD on 01/20/2017  Malignant neoplasm of upper-outer quadrant of left breast in female, estrogen receptor positive ([place]) Staging form: Breast, AJCC 8th Edition - Clinical stage from 01/07/2017: Stage IA (cT1b, cN0, cM0, G2, ER: Positive, PR: Positive, HER2: Negative) - Signed by Truitt Merle, MD on 01/20/2017       Malignant neoplasm of upper-inner quadrant of right breast in female, estrogen receptor positive ([place])   01/01/2017 Mammogram    IMPRESSION: 1. 2.0 cm mass in the 2 o'clock position of the right breast with imaging features highly suspicious for malignancy. 2. 0.7 cm mass in the 2 o'clock position of the left breast with imaging features highly suspicious for malignancy. 3. No abnormal appearing axillary lymph nodes on either side.      01/07/2017 Receptors her2    Right Breast: Prognostic indicators significant for: ER, 95% positive and PR, 50% positive, both with strong staining intensity. Proliferation marker Ki67 at 30. HER2 negative. RATIO OF HER2/CEP17 SIGNALS 0.81 AVERAGE HER2 COPY NUMBER PER CELL 1.70  Left Breast: Prognostic indicators  significant for: ER, 100% positive and PR, 100% positive, both with strong staining intensity. Proliferation marker Ki67 at 10. HER2 negative. RATIO OF HER2/CEP17 SIGNALS 1.75 AVERAGE HER2 COPY NUMBER PER CELL 3.15      01/07/2017 Initial Biopsy    Diagnosis 1. Breast, right, needle core biopsy, 2:00 o'clock - INVASIVE DUCTAL CARCINOMA, SEE COMMENT. 2. Breast, left, needle core biopsy, 2:00 o'clock - INVASIVE MAMMARY CARCINOMA, SEE COMMENT.       01/20/2017 Initial Diagnosis    Malignant neoplasm of upper-inner quadrant of right breast in female, estrogen receptor positive ([place])        HISTORY OF PRESENTING ILLNESS:  Jill Myers 84 y.o. female is a here because of newly diagnosed malignant neoplasm of the right and left breast. The patient presents to the clinic today accompanied by her friend Katharine Look.  Pt first felt a mass in her breast 2 weeks ago. She has noticed no other change, she has no symptoms of fatigue, decreased appetite, or arthritis. Her diagnostic mammogram on 01/01/17 revealing a mass in both of her breasts. Her initial biopsy on 01/07/17 revealed invasive ductal carcinoma of the right breast and invasive mammary carcinoma in the left breast.   Right Breast: Prognostic indicators significant for: ER, 95% positive and PR, 50% positive, both with strong staining intensity. Proliferation marker Ki67 at 30. HER2 negative.  Left Breast: Prognostic indicators significant for: ER, 100% positive and PR, 100% positive, both with strong staining intensity. Proliferation marker Ki67 at 10. HER2 negative.  In the past the patient was diagnosed with significant comorbidities of essential Thrombocytopenia. She is treated with hydrea by Dr. Beryle Beams. She has no FHx of CA. Pt used tobacco as a teenager but  does not currently. She drinks alcohol occassionally.   Socially, she lives in a retirement home alone and exercises almost daily through programs there.   GYN HISTORY  Menarchal:  14 LMP: late 5s Contraceptive: HRT: Yes, for 3 years following menopause  GP: 3/2, first birth at 61   MEDICAL HISTORY:  Past Medical History:  Diagnosis Date  . Bladder cystocele 05/21/2010  . Deglutition disorder 08/17/2012   Single episode of choking/aspiration; preceded by trouble swallowing saliva (thin liquids).  For SLP evaluation. Aug 17, 2012.    [name] Disease of blood and blood forming organ 01/05/2007   Qualifier: Diagnosis of  By: Oneida Alar MD, KARL    . Diverticulitis   . DIVERTICULOSIS OF COLON 03/05/2006   Qualifier: Diagnosis of  By: Samara Snide    . DIZZINESS 01/18/2010   Qualifier: Diagnosis of  By: Lindell Noe MD, Jeneen Rinks    . Essential hypertension, benign 02/22/2008   Qualifier: Diagnosis of  By: Lindell Noe MD, Jeneen Rinks     . Essential thrombocythemia ([place]) 01/31/11  . Family history of Alzheimer's disease 11/11/2013   Patient with family history of Alzheimers disease. She herself is very highly functional.  Plans for future visit with MMSE as primary function of the visit.    [name] GERD (gastroesophageal reflux disease) 08/16/2010  . HALLUX VALGUS, ACQUIRED 04/10/2009   Qualifier: Diagnosis of  By: [name] CMA,, [place]    . Hypertension   . HYPERTRIGLYCERIDEMIA 01/05/2007   Qualifier: Diagnosis of  By: Oneida Alar MD, KARL    . Insomnia 02/10/2014  . Left-sided chest wall pain 12/19/2011    L sided chest wall pain follows a dermatomal distribution and raises the possibility of thoracic radiculopathic pain, which would be expected to improve with gabapentin as well. We discussed this at length and she will call to make me aware if this does not get better. Rib belt/girdle in the meantime; may consider rib films or CXR if not improving.     . Metatarsalgia of right foot 11/28/2013   Referral to [facility]   . Myeloma ([place])    prolefica  . Osteopenia   . Restless leg syndrome 06/09/2011  . Restless legs syndrome (RLS)   . URINARY INCONTINENCE, URGE, MILD 11/27/2009   Qualifier: Diagnosis of  By: Zebedee Iba NP,  Manuela Schwartz    . Vitamin D deficiency 08/16/2010    SURGICAL HISTORY: Past Surgical History:  Procedure Laterality Date  . BIOPSY BREAST Left 1998   Dr. Rebekah Chesterfield  . BREAST EXCISIONAL BIOPSY    . CATARACT EXTRACTION  2010   Dr. [name]  . CHOLECYSTECTOMY  1992  . COLONOSCOPY  2008  . MOHS SURGERY  2012   on face [facility] [place]  . NAILBED REPAIR  12/05/2010   Procedure: NAILBED REPAIR;  Surgeon: Cammie Sickle., MD;  Location: [place];  Service: Orthopedics;  Laterality: Right;  full thickness nail biopsy right hallux  . torn tenden  2012   hand Dr. Orie Rout    SOCIAL HISTORY: Social History   Socioeconomic History  . Marital status: Widowed    Spouse name: Not on file  . Number of children: 2  . Years of education: bachelors  . Highest education level: Not on file  Social Needs  . Financial resource strain: Not on file  . Food insecurity - worry: Not on file  . Food insecurity - inability: Not on file  . Transportation needs - medical: Not on file  . Transportation needs - non-medical: Not  on file  Occupational History  . Occupation: Tree surgeon: UNEMPLOYED  . Occupation: Retired- Paramedic  Tobacco Use  . Smoking status: Former Smoker    Years: 3.00    Last attempt to quit: 12/02/1958    Years since quitting: 58.1  . Smokeless tobacco: Never Used  Substance and Sexual Activity  . Alcohol use: Yes    Alcohol/week: 0.0 oz    Comment: Wine rarely.  . Drug use: No  . Sexual activity: No  Other Topics Concern  . Not on file  Social History Narrative   Health Care POA: [name]- Son   Emergency Contact: neighbor, [name] [phone]   End of Life Plan: POA, Living Will   Widow   [facility] at [facility] since [facility] 2013.   Any pets: none   Diet: Pt has a varied diet of protein, starch, vegetables   Exercise: Pt exercises 3x week for 1 hour with group.   Seatbelts: Pt reports wearing seatbelt  when in vehicles.    Sun Exposure/Protection: pt does not wear sun protection   Hobbies: opera, symphony, reading, walking       FAMILY HISTORY: Family History  Problem Relation Age of Onset  . Heart disease Mother   . Tuberculosis Father   . Heart disease Sister   . Heart disease Brother   . Alzheimer's disease Brother   . Alzheimer's disease Brother     ALLERGIES:  is allergic to doxycycline; macrodantin [nitrofurantoin]; and sulfamethoxazole-trimethoprim.  MEDICATIONS:  Current Outpatient Medications  Medication Sig Dispense Refill  . anastrozole (ARIMIDEX) 1 MG tablet Take 1 tablet (1 mg total) by mouth daily. 30 tablet 3  . aspirin 81 MG tablet Take 81 mg by mouth daily.      . calcium carbonate (OSCAL) 1500 (600 Ca) MG TABS tablet Take 600 mg of elemental calcium by mouth daily.    . cholecalciferol (VITAMIN D) 1000 UNITS tablet Take 1,000 Units by mouth daily.    [name] docusate sodium (COLACE) 250 MG capsule Take 250 mg by mouth daily.    [name] gabapentin (NEURONTIN) 300 MG capsule TAKE ONE CAPSULE BY MOUTH EVERY MORNING 2 CAPS AT NOON AND 3 CAPS AT EVENING 180 capsule 5  . Glucosamine-Chondroitin 750-600 MG TABS Take 1 tablet by mouth. Take one tablet daily    . hydrochlorothiazide (HYDRODIURIL) 25 MG tablet TAKE 1 TABLET BY MOUTH EVERY DAY 90 tablet 1  . hydroxyurea (HYDREA) 500 MG capsule TAKE 2 CAPSULES (1,000 MG TOTAL) BY MOUTH DAILY. MAY TAKE WITH FOOD TO MINIMIZE GI SIDE EFFECTS. 60 capsule 2  . ibuprofen (ADVIL,MOTRIN) 200 MG tablet Take 200 mg by mouth as needed.    . Multiple Vitamins-Minerals (MULTIVITAMIN WITH MINERALS) tablet Take 1 tablet by mouth daily.    [name] omeprazole (PRILOSEC) 20 MG capsule Take 20 mg by mouth daily.    . polyethylene glycol (MIRALAX / GLYCOLAX) packet Take 17 g by mouth daily.      [name] pyridOXINE (VITAMIN B-6) 100 MG tablet Take 100 mg by mouth daily.    . solifenacin (VESICARE) 10 MG tablet Take 10 mg by mouth daily.    . Vitamins A D C (COD  LIVER OIL PLUS VITAMIN C) CHEW Chew 1 tablet by mouth daily.     No current facility-administered medications for this visit.     REVIEW OF SYSTEMS:   Constitutional: Denies fevers, chills or abnormal night sweats Eyes: Denies blurriness of vision, double vision or  watery eyes Ears, nose, mouth, throat, and face: Denies mucositis or sore throat Respiratory: Denies cough, dyspnea or wheezes Cardiovascular: Denies palpitation, chest discomfort or lower extremity swelling Gastrointestinal:  Denies nausea, heartburn or change in bowel habits Skin: Denies abnormal skin rashes Lymphatics: Denies new lymphadenopathy or easy bruising Neurological:Denies numbness, tingling or new weaknesses Behavioral/Psych: Mood is stable, no new changes  All other systems were reviewed with the patient and are negative. Breast: (+) breast mass  PHYSICAL EXAMINATION: ECOG PERFORMANCE STATUS: 0 - Asymptomatic  Vitals:   01/21/17 1251  BP: (!) 146/78  Pulse: 77  Resp: 18  Temp: 98.2 F (36.8 C)  SpO2: 94%   Filed Weights   01/21/17 1251  Weight: 136 lb 8 oz (61.9 kg)    GENERAL:alert, no distress and comfortable SKIN: skin color, texture, turgor are normal, no rashes or significant lesions EYES: normal, conjunctiva are pink and non-injected, sclera clear OROPHARYNX:no exudate, no erythema and lips, buccal mucosa, and tongue normal  NECK: supple, thyroid normal size, non-tender, without nodularity LYMPH:  no palpable lymphadenopathy in the cervical, axillary or inguinal LUNGS: clear to auscultation and percussion with normal breathing effort HEART: regular rate & rhythm and no murmurs and no lower extremity edema ABDOMEN:abdomen soft, non-tender and normal bowel sounds Musculoskeletal:no cyanosis of digits and no clubbing  PSYCH: alert & oriented x 3 with fluent speech NEURO: no focal motor/sensory deficits R BREAST: 2 by 2.5 cm irregular shaped mass at the 2 O'clock position. Significant diffuse  ecchymosis  L BREAST: No palpable mass. Skin eccymosis form the biopsy   LABORATORY DATA:  I have reviewed the data as listed CBC Latest Ref Rng & Units 01/21/2017 12/09/2016 03/13/2016  WBC 3.9 - 10.3 K/uL 4.2 3.3(L) 3.9  Hemoglobin 11.7 - 15.5 g/dL - 12.2 12.2  Hematocrit 34.8 - 46.6 % 34.9 34.0(L) 34(A)  Platelets 145 - 400 K/uL 437(H) 441(H) 398    CMP Latest Ref Rng & Units 01/21/2017 12/09/2016 09/04/2015  Glucose 70 - 140 mg/dL 100 83 -  BUN 7 - 26 mg/dL 13 14 14   Creatinine 0.60 - 0.88 mg/dL - 0.80 0.8  Sodium 136 - 145 mmol/L 137 136 138  Potassium 3.3 - 4.7 mmol/L 3.4 3.9 4.0  Chloride 98 - 109 mmol/L 99 99 -  CO2 22 - 29 mmol/L 29 30 -  Calcium 8.4 - 10.4 mg/dL 8.6 9.1 -  Total Protein 6.4 - 8.3 g/dL 6.6 6.3 -  Total Bilirubin 0.2 - 1.2 mg/dL 0.5 0.5 -  Alkaline Phos 40 - 150 U/L 48 - 39  AST 5 - 34 U/L 22 21 20   ALT 0 - 55 U/L 26 25 26    PATHOLOGY Initial Biopsy 01/07/17 Diagnosis 1. Breast, right, needle core biopsy, 2:00 o'clock - INVASIVE DUCTAL CARCINOMA, SEE COMMENT. 2. Breast, left, needle core biopsy, 2:00 o'clock - INVASIVE MAMMARY CARCINOMA, SEE COMMENT. Microscopic Comment 1. The carcinoma appears grade 3. Prognostic markers will be ordered. Dr. Lyndon Code has reviewed the case. The case was called to [facility] on 01/08/2017. 2. The carcinoma appears grade 2. E-cadherin will be ordered. Parts #1 and #2 have different morphology. Prognostic markers will be ordered. Dr. Lyndon Code has reviewed the case. The case was called to [facility] on 01/08/2017. 1.Results: IMMUNOHISTOCHEMICAL AND MORPHOMETRIC ANALYSIS PERFORMED MANUALLY Estrogen Receptor: 95%, POSITIVE, STRONG STAINING INTENSITY Progesterone Receptor: 50%, POSITIVE, STRONG STAINING INTENSITY Proliferation Marker Ki67: 30% 1.Results: HER2 - NEGATIVE RATIO OF HER2/CEP17 SIGNALS 0.81 AVERAGE  HER2 COPY NUMBER PER CELL 1.70 2.Results: IMMUNOHISTOCHEMICAL AND MORPHOMETRIC  ANALYSIS PERFORMED MANUALLY Estrogen Receptor: 100%, POSITIVE, STRONG STAINING INTENSITY Progesterone Receptor: 100%, POSITIVE, STRONG STAINING INTENSITY Proliferation Marker Ki67: 10% 2.Results: HER2 - NEGATIVE RATIO OF HER2/CEP17 SIGNALS 1.75 AVERAGE HER2 COPY NUMBER PER CELL 3.15  RADIOGRAPHIC STUDIES: I have personally reviewed the radiological images as listed and agreed with the findings in the report. US Breast Ltd Uni Left Inc Axilla  Result Date: 01/01/2017 CLINICAL DATA:  Mass felt by the patient in the upper inner right breast for the past week. EXAM: 2D DIGITAL DIAGNOSTIC BILATERAL MAMMOGRAM WITH CAD AND ADJUNCT TOMO ULTRASOUND BILATERAL BREAST COMPARISON:  Previous exam(s). ACR Breast Density Category c: The breast tissue is heterogeneously dense, which may obscure small masses. FINDINGS: 2D and 3D tomographic images of the right breast demonstrate an oval mass with irregular margins in the posterior aspect of the upper inner quadrant of the breast. This corresponds to the mass felt by the patient, marked with a metallic marker. There is a small irregular mass with spiculated margins in the posterior aspect of the upper-outer left breast in approximately the 2 to 3 o'clock position. No abnormal axillary lymph nodes are visible mammographically. Mammographic images were processed with CAD. On physical exam, there is an approximately 2.5 cm oval, firm, mobile palpable mass in the 2 o'clock position of the right breast, 6 cm from the nipple. This is protruding anteriorly on visible inspection of the breast with the patient supine. No mass is palpable in the outer left breast. There are no palpable axillary lymph nodes on either side. Targeted ultrasound is performed, showing a 2.0 x 1.7 x 1.5 cm irregular, hypoechoic mass in the 2 o'clock position of the right breast, 6 cm from the nipple. This exhibits minimal posterior acoustical shadowing. There is a 0.7 x 0.7 x 0.5 cm irregular,  vertically oriented hypoechoic mass exhibiting posterior acoustical shadowing in the 2 o'clock position of the left breast, 4 cm from the nipple. Ultrasound of the axillae demonstrated normal appearing axillary lymph nodes bilaterally. IMPRESSION: 1. 2.0 cm mass in the 2 o'clock position of the right breast with imaging features highly suspicious for malignancy. 2. 0.7 cm mass in the 2 o'clock position of the left breast with imaging features highly suspicious for malignancy. 3. No abnormal appearing axillary lymph nodes on either side. RECOMMENDATION: Ultrasound-guided core needle biopsies of the 2.0 cm mass in the 2 o'clock position of the right breast and the 0.7 cm mass in the 2 o'clock position of the left breast. This has been discussed with the patient and scheduled at 1:45 p.m. on 01/07/2017. I have discussed the findings and recommendations with the patient. Results were also provided in writing at the conclusion of the visit. If applicable, a reminder letter will be sent to the patient regarding the next appointment. BI-RADS CATEGORY  5: Highly suggestive of malignancy. Electronically Signed   By: Claudie Revering M.D.   On: 01/01/2017 10:59   US Breast Ltd Uni Right Inc Axilla  Result Date: 01/01/2017 CLINICAL DATA:  Mass felt by the patient in the upper inner right breast for the past week. EXAM: 2D DIGITAL DIAGNOSTIC BILATERAL MAMMOGRAM WITH CAD AND ADJUNCT TOMO ULTRASOUND BILATERAL BREAST COMPARISON:  Previous exam(s). ACR Breast Density Category c: The breast tissue is heterogeneously dense, which may obscure small masses. FINDINGS: 2D and 3D tomographic images of the right breast demonstrate an oval mass with irregular margins in the posterior aspect of the upper  inner quadrant of the breast. This corresponds to the mass felt by the patient, marked with a metallic marker. There is a small irregular mass with spiculated margins in the posterior aspect of the upper-outer left breast in approximately  the 2 to 3 o'clock position. No abnormal axillary lymph nodes are visible mammographically. Mammographic images were processed with CAD. On physical exam, there is an approximately 2.5 cm oval, firm, mobile palpable mass in the 2 o'clock position of the right breast, 6 cm from the nipple. This is protruding anteriorly on visible inspection of the breast with the patient supine. No mass is palpable in the outer left breast. There are no palpable axillary lymph nodes on either side. Targeted ultrasound is performed, showing a 2.0 x 1.7 x 1.5 cm irregular, hypoechoic mass in the 2 o'clock position of the right breast, 6 cm from the nipple. This exhibits minimal posterior acoustical shadowing. There is a 0.7 x 0.7 x 0.5 cm irregular, vertically oriented hypoechoic mass exhibiting posterior acoustical shadowing in the 2 o'clock position of the left breast, 4 cm from the nipple. Ultrasound of the axillae demonstrated normal appearing axillary lymph nodes bilaterally. IMPRESSION: 1. 2.0 cm mass in the 2 o'clock position of the right breast with imaging features highly suspicious for malignancy. 2. 0.7 cm mass in the 2 o'clock position of the left breast with imaging features highly suspicious for malignancy. 3. No abnormal appearing axillary lymph nodes on either side. RECOMMENDATION: Ultrasound-guided core needle biopsies of the 2.0 cm mass in the 2 o'clock position of the right breast and the 0.7 cm mass in the 2 o'clock position of the left breast. This has been discussed with the patient and scheduled at 1:45 p.m. on 01/07/2017. I have discussed the findings and recommendations with the patient. Results were also provided in writing at the conclusion of the visit. If applicable, a reminder letter will be sent to the patient regarding the next appointment. BI-RADS CATEGORY  5: Highly suggestive of malignancy. Electronically Signed   By: Claudie Revering M.D.   On: 01/01/2017 10:59   Mm Diag Breast Tomo Bilateral  Result  Date: 01/01/2017 CLINICAL DATA:  Mass felt by the patient in the upper inner right breast for the past week. EXAM: 2D DIGITAL DIAGNOSTIC BILATERAL MAMMOGRAM WITH CAD AND ADJUNCT TOMO ULTRASOUND BILATERAL BREAST COMPARISON:  Previous exam(s). ACR Breast Density Category c: The breast tissue is heterogeneously dense, which may obscure small masses. FINDINGS: 2D and 3D tomographic images of the right breast demonstrate an oval mass with irregular margins in the posterior aspect of the upper inner quadrant of the breast. This corresponds to the mass felt by the patient, marked with a metallic marker. There is a small irregular mass with spiculated margins in the posterior aspect of the upper-outer left breast in approximately the 2 to 3 o'clock position. No abnormal axillary lymph nodes are visible mammographically. Mammographic images were processed with CAD. On physical exam, there is an approximately 2.5 cm oval, firm, mobile palpable mass in the 2 o'clock position of the right breast, 6 cm from the nipple. This is protruding anteriorly on visible inspection of the breast with the patient supine. No mass is palpable in the outer left breast. There are no palpable axillary lymph nodes on either side. Targeted ultrasound is performed, showing a 2.0 x 1.7 x 1.5 cm irregular, hypoechoic mass in the 2 o'clock position of the right breast, 6 cm from the nipple. This exhibits minimal posterior acoustical shadowing. There  is a 0.7 x 0.7 x 0.5 cm irregular, vertically oriented hypoechoic mass exhibiting posterior acoustical shadowing in the 2 o'clock position of the left breast, 4 cm from the nipple. Ultrasound of the axillae demonstrated normal appearing axillary lymph nodes bilaterally. IMPRESSION: 1. 2.0 cm mass in the 2 o'clock position of the right breast with imaging features highly suspicious for malignancy. 2. 0.7 cm mass in the 2 o'clock position of the left breast with imaging features highly suspicious for  malignancy. 3. No abnormal appearing axillary lymph nodes on either side. RECOMMENDATION: Ultrasound-guided core needle biopsies of the 2.0 cm mass in the 2 o'clock position of the right breast and the 0.7 cm mass in the 2 o'clock position of the left breast. This has been discussed with the patient and scheduled at 1:45 p.m. on 01/07/2017. I have discussed the findings and recommendations with the patient. Results were also provided in writing at the conclusion of the visit. If applicable, a reminder letter will be sent to the patient regarding the next appointment. BI-RADS CATEGORY  5: Highly suggestive of malignancy. Electronically Signed   By: Claudie Revering M.D.   On: 01/01/2017 10:59   Mm Clip Placement Left  Result Date: 01/07/2017 CLINICAL DATA:  Evaluate biopsy marker EXAM: DIAGNOSTIC LEFT MAMMOGRAM POST ULTRASOUND BIOPSY COMPARISON:  Previous exam(s). FINDINGS: Mammographic images were obtained following ultrasound guided biopsy of a left breast mass. The coil shaped clip is located within the biopsied left breast mass. IMPRESSION: Appropriate clip placement as above. Final Assessment: Post Procedure Mammograms for Marker Placement Electronically Signed   By: Dorise Bullion III M.D   On: 01/07/2017 14:46   Mm Clip Placement Right  Result Date: 01/07/2017 CLINICAL DATA:  Evaluate clip placement after biopsy of right-sided mass. EXAM: DIAGNOSTIC RIGHT MAMMOGRAM POST ULTRASOUND BIOPSY COMPARISON:  Previous exam(s). FINDINGS: Mammographic images were obtained following ultrasound guided biopsy of the right-sided mass. The ribbon shaped clip is within the mass. IMPRESSION: Appropriate clip placement within the right-sided mass. Final Assessment: Post Procedure Mammograms for Marker Placement Electronically Signed   By: Dorise Bullion III M.D   On: 01/07/2017 14:45   [place] Lt Breast Bx W Loc Dev 1st Lesion Img Bx Spec US Guide  Addendum Date: 01/09/2017   ADDENDUM REPORT: 01/08/2017 15:24 ADDENDUM:  Pathology revealed GRADE III INVASIVE DUCTAL CARCINOMA of the Right breast, 2:00 o'clock. GRADE II INVASIVE MAMMARY CARCINOMA of the Left breast, 2:00 o'clock. This was found to be concordant by Dr. Dorise Bullion. Pathology results were discussed with the patient by telephone. The patient reported doing well after the biopsies with tenderness and bruising at the sites. Post biopsy instructions and care were reviewed and questions were answered. The patient was encouraged to call [facility] for any additional concerns. The patient was referred to [facility] on January 21, 2017. Pathology results reported by Terie Purser, RN on 01/08/2017. Electronically Signed   By: Dorise Bullion III M.D   On: 01/08/2017 15:24   Result Date: 01/09/2017 CLINICAL DATA:  Left breast mass EXAM: ULTRASOUND GUIDED LEFT BREAST CORE NEEDLE BIOPSY COMPARISON:  Previous exam(s). FINDINGS: I met with the patient and we discussed the procedure of ultrasound-guided biopsy, including benefits and alternatives. We discussed the high likelihood of a successful procedure. We discussed the risks of the procedure, including infection, bleeding, tissue injury, clip migration, and inadequate sampling. Informed written consent was given. The usual time-out protocol  was performed immediately prior to the procedure. Lesion quadrant: Upper-outer Using sterile technique and 1% Lidocaine as local anesthetic, under direct ultrasound visualization, a 12 gauge spring-loaded device was used to perform biopsy of a left breast mass using a lateral approach. At the conclusion of the procedure a tissue marker clip was deployed into the biopsy cavity. Follow up 2 view mammogram was performed and dictated separately. IMPRESSION: Ultrasound guided biopsy of a left breast mass. No apparent complications. Electronically Signed: By: Dorise Bullion III M.D On:  01/07/2017 14:31   [place] Rt Breast Bx W Loc Dev 1st Lesion Img Bx Spec US Guide  Addendum Date: 01/09/2017   ADDENDUM REPORT: 01/08/2017 15:24 ADDENDUM: Pathology revealed GRADE III INVASIVE DUCTAL CARCINOMA of the Right breast, 2:00 o'clock. GRADE II INVASIVE MAMMARY CARCINOMA of the Left breast, 2:00 o'clock. This was found to be concordant by Dr. Dorise Bullion. Pathology results were discussed with the patient by telephone. The patient reported doing well after the biopsies with tenderness and bruising at the sites. Post biopsy instructions and care were reviewed and questions were answered. The patient was encouraged to call [facility] for any additional concerns. The patient was referred to [facility] on January 21, 2017. Pathology results reported by Terie Purser, RN on 01/08/2017. Electronically Signed   By: Dorise Bullion III M.D   On: 01/08/2017 15:24   Result Date: 01/09/2017 CLINICAL DATA:  Right breast mass EXAM: ULTRASOUND GUIDED RIGHT BREAST CORE NEEDLE BIOPSY COMPARISON:  Previous exam(s). FINDINGS: I met with the patient and we discussed the procedure of ultrasound-guided biopsy, including benefits and alternatives. We discussed the high likelihood of a successful procedure. We discussed the risks of the procedure, including infection, bleeding, tissue injury, clip migration, and inadequate sampling. Informed written consent was given. The usual time-out protocol was performed immediately prior to the procedure. Lesion quadrant: Upper inner Using sterile technique and 1% Lidocaine as local anesthetic, under direct ultrasound visualization, a 12 gauge spring-loaded device was used to perform biopsy of a right breast mass using a bladder approach. At the conclusion of the procedure a ribbon shaped tissue marker clip was deployed into the biopsy cavity. Follow up 2 view mammogram was performed and  dictated separately. IMPRESSION: Ultrasound guided biopsy of a right breast mass. No apparent complications. Electronically Signed: By: Dorise Bullion III M.D On: 01/07/2017 14:30    ASSESSMENT & PLAN: 84 yo female   1.  Bilateral breast cancer, invasive ductal carcinoma, right upper inner quadrant, cT1cN0M0, stage IA, ER+/PR+/HER2-, G3, left upper-outer quadrant, cT1bN0M0, stage IA, ER+/PR+/HER2-, G2 -We discussed her imaging findings and the biopsy results in great details. -Giving her early stage disease, she likely is a candidate for b/l lumpectomy. Due to her advanced age,and clinic negative node, and her willingness to have adjuvant radiation, Dr. Marlou Starks did not recommend SLN biopsy. She is agreeable with that. She was seen by Dr. Marlou Starks today and likely will proceed with surgery soon in mid February. -She was also seen by radiation oncologist Dr. Sondra Come today and she will likely proceed with radiation after surgery.   -Due to her age, she is likely not a candidate for chemotherapy. I do not recommend Oncotype test  -We discussed the risk of cancer recurrence after surgery. Given her high grade disease, but stage IA disease, she may have moderate or low risk for recurrence   -Giving the strong ER and PR  expression in her postmenopausal status, I recommend adjuvant endocrine therapy with aromatase inhibitor for a total of 5 years to reduce the risk of cancer recurrence. --The potential benefit and side effects, which includes but not limited to, hot flash, skin and vaginal dryness, metabolic changes ( increased blood glucose, cholesterol, weight, etc.), slightly in increased risk of cardiovascular disease, cataracts, muscular and joint discomfort, osteopenia and osteoporosis, etc, were discussed with her in great details. She initially was reluctant to take medication, but agreed to try after discussion. Due to her advanced age, if she truly has significant side effects, will stop  -she plan to wait  until her daughter comes in mid to late Feb to have surgery.  I recommend her to try anastrozole while she is waiting for surgery. She agrees  -We also discussed the breast cancer surveillance after her surgery. She will continue annual screening mammogram, self exam, and a routine office visit with lab and exam with [place]. -I encouraged her to have healthy diet and exercise regularly - Labs today (01/21/17) she has a Hgb of 11.7 and mild thrombocytosis   2. Essential Thrombocytopenia  -Takes Hydrea per Dr. Beryle Beams. -We will monitor her platelet count during office visits -Platelet count is 437K today (01/21/17), controlled  -she is also on ASA to reduce risk of thrombosis   3. Osteopenia  -DEXA from 11/20/14 had a T Score of -2.2 -on vit D and calcium supplement    PLAN: Start anastrozole this week, if she tolerates well, will continue through surgery and radiation  She will have b/l lumpectomy by Dr. Marlou Starks in mid Feb 2019 F/u after surgery and radiation, or sooner if needed    No orders of the defined types were placed in this encounter.   All questions were answered. The patient knows to call the clinic with any problems, questions or concerns. I spent 50 minutes counseling the patient face to face. The total time spent in the appointment was 60 minutes and more than 50% was on counseling.  This document serves as a record of services personally performed by Truitt Merle, MD. It was created on her behalf by Theresia Bough, a trained medical scribe. The creation of this record is based on the scribe's personal observations and the provider's statements to them.   I have reviewed the above documentation for accuracy and completeness, and I agree with the above.     Truitt Merle, MD 01/21/2017 7:45 PM

## 2017-01-21 ENCOUNTER — Inpatient Hospital Stay: Payer: Medicare Other | Attending: Hematology | Admitting: Hematology

## 2017-01-21 ENCOUNTER — Ambulatory Visit: Payer: Medicare Other | Admitting: Physical Therapy

## 2017-01-21 ENCOUNTER — Other Ambulatory Visit: Payer: Self-pay | Admitting: Internal Medicine

## 2017-01-21 ENCOUNTER — Ambulatory Visit: Payer: Self-pay | Admitting: General Surgery

## 2017-01-21 ENCOUNTER — Inpatient Hospital Stay: Payer: Medicare Other

## 2017-01-21 ENCOUNTER — Ambulatory Visit
Admission: RE | Admit: 2017-01-21 | Discharge: 2017-01-21 | Disposition: A | Discharge status: Home / Self Care | Payer: Medicare Other | Source: Ambulatory Visit | Attending: Radiation Oncology | Admitting: Radiation Oncology

## 2017-01-21 ENCOUNTER — Encounter: Payer: Self-pay | Admitting: Hematology

## 2017-01-21 VITALS — BP 146/78 | HR 77 | Temp 98.2°F | Resp 18 | Ht 63.0 in | Wt 136.5 lb

## 2017-01-21 DIAGNOSIS — Z170 Estrogen receptor positive status [ER+]: Secondary | ICD-10-CM | POA: Diagnosis not present

## 2017-01-21 DIAGNOSIS — C50211 Malignant neoplasm of upper-inner quadrant of right female breast: Secondary | ICD-10-CM | POA: Diagnosis not present

## 2017-01-21 DIAGNOSIS — Z881 Allergy status to other antibiotic agents status: Secondary | ICD-10-CM | POA: Diagnosis not present

## 2017-01-21 DIAGNOSIS — Z7982 Long term (current) use of aspirin: Secondary | ICD-10-CM | POA: Insufficient documentation

## 2017-01-21 DIAGNOSIS — D473 Essential (hemorrhagic) thrombocythemia: Secondary | ICD-10-CM | POA: Diagnosis not present

## 2017-01-21 DIAGNOSIS — M8580 Other specified disorders of bone density and structure, unspecified site: Secondary | ICD-10-CM | POA: Diagnosis not present

## 2017-01-21 DIAGNOSIS — Z87891 Personal history of nicotine dependence: Secondary | ICD-10-CM

## 2017-01-21 DIAGNOSIS — C50412 Malignant neoplasm of upper-outer quadrant of left female breast: Secondary | ICD-10-CM

## 2017-01-21 DIAGNOSIS — C50512 Malignant neoplasm of lower-outer quadrant of left female breast: Secondary | ICD-10-CM

## 2017-01-21 DIAGNOSIS — K219 Gastro-esophageal reflux disease without esophagitis: Secondary | ICD-10-CM | POA: Diagnosis not present

## 2017-01-21 DIAGNOSIS — I10 Essential (primary) hypertension: Secondary | ICD-10-CM | POA: Insufficient documentation

## 2017-01-21 DIAGNOSIS — D759 Disease of blood and blood-forming organs, unspecified: Secondary | ICD-10-CM

## 2017-01-21 DIAGNOSIS — Z79899 Other long term (current) drug therapy: Secondary | ICD-10-CM | POA: Diagnosis not present

## 2017-01-21 LAB — CBC WITH DIFFERENTIAL (CANCER CENTER ONLY)
BASOS PCT: 0 %
Basophils Absolute: 0 10*3/uL (ref 0.0–0.1)
EOS ABS: 0.1 10*3/uL (ref 0.0–0.5)
EOS PCT: 1 %
HCT: 34.9 % (ref 34.8–46.6)
Hemoglobin: 11.7 g/dL (ref 11.6–15.9)
Lymphocytes Relative: 20 %
Lymphs Abs: 0.8 10*3/uL — ABNORMAL LOW (ref 0.9–3.3)
MCH: 40.3 pg — ABNORMAL HIGH (ref 25.1–34.0)
MCHC: 33.5 g/dL (ref 31.5–36.0)
MCV: 120.3 fL — ABNORMAL HIGH (ref 79.5–101.0)
MONO ABS: 0.7 10*3/uL (ref 0.1–0.9)
MONOS PCT: 17 %
Neutro Abs: 2.6 10*3/uL (ref 1.5–6.5)
Neutrophils Relative %: 62 %
Platelet Count: 437 10*3/uL — ABNORMAL HIGH (ref 145–400)
RBC: 2.9 MIL/uL — ABNORMAL LOW (ref 3.70–5.45)
RDW: 13.7 % (ref 11.2–16.1)
WBC Count: 4.2 10*3/uL (ref 3.9–10.3)

## 2017-01-21 LAB — CMP (CANCER CENTER ONLY)
ALBUMIN: 4 g/dL (ref 3.5–5.0)
ALK PHOS: 48 U/L (ref 40–150)
ALT: 26 U/L (ref 0–55)
AST: 22 U/L (ref 5–34)
Anion gap: 9 (ref 3–11)
BUN: 13 mg/dL (ref 7–26)
CALCIUM: 8.6 mg/dL (ref 8.4–10.4)
CO2: 29 mmol/L (ref 22–29)
CREATININE: 0.82 mg/dL (ref 0.60–1.10)
Chloride: 99 mmol/L (ref 98–109)
GFR, Est AFR Am: >60 mL/min (ref >60)
GFR, Estimated: >60 mL/min (ref >60)
GLUCOSE: 100 mg/dL (ref 70–140)
Potassium: 3.4 mmol/L (ref 3.3–4.7)
Sodium: 137 mmol/L (ref 136–145)
Total Bilirubin: 0.5 mg/dL (ref 0.2–1.2)
Total Protein: 6.6 g/dL (ref 6.4–8.3)

## 2017-01-21 MED ORDER — ANASTROZOLE 1 MG PO TABS: 1.0000 mg | ORAL_TABLET | Freq: Every day | ORAL | 3 refills | Status: DC

## 2017-01-21 NOTE — Progress Notes (Signed)
Nutrition Assessment  Reason for Assessment:  Pt seen in Breast Clinic  ASSESSMENT:   84 year old female with new diagnosis of breast cancer.  Past medical history of HTN, GERD, chronic constipation  Patient reports good appetite. Lives at [facility] and has evening meal prepared for her.   Medications:  reviewed  Labs: reviewed  Anthropometrics:   Height: 63 inches Weight: 136 lb 8 oz BMI: 24   NUTRITION DIAGNOSIS: Food and nutrition related knowledge deficit related to new diagnosis of breast cancer as evidenced by no prior need for nutrition related information.  INTERVENTION:   Discussed and provided packet of information regarding nutritional tips for breast cancer patients.  Questions answered.  Teachback method used.  Contact information provided and patient knows to contact me with questions/concerns.    MONITORING, EVALUATION, and GOAL: Pt will consume a healthy plant based diet to maintain lean body mass throughout treatment.   Perlie Scheuring B. Zenia Resides, [place], [place] Registered Dietitian [phone] (pager)

## 2017-01-22 ENCOUNTER — Encounter: Payer: Self-pay | Admitting: General Practice

## 2017-01-22 ENCOUNTER — Telehealth: Payer: Self-pay | Admitting: Hematology

## 2017-01-22 LAB — HEPATIC FUNCTION PANEL
AG RATIO: 2 (calc) (ref 1.0–2.5)
ALKALINE PHOSPHATASE (APISO): 53 U/L (ref 33–130)
ALT: 25 U/L (ref 6–29)
AST: 22 U/L (ref 10–35)
Albumin: 4.2 g/dL (ref 3.6–5.1)
BILIRUBIN TOTAL: 0.6 mg/dL (ref 0.2–1.2)
Bilirubin, Direct: 0.2 mg/dL (ref 0.0–0.2)
GLOBULIN: 2.1 g/dL (ref 1.9–3.7)
Indirect Bilirubin: 0.4 mg/dL (calc) (ref 0.2–1.2)
TOTAL PROTEIN: 6.3 g/dL (ref 6.1–8.1)

## 2017-01-22 LAB — COMPREHENSIVE METABOLIC PANEL
AG RATIO: 2 (calc) (ref 1.0–2.5)
ALKALINE PHOSPHATASE (APISO): 53 U/L (ref 33–130)
ALT: 25 U/L (ref 6–29)
AST: 22 U/L (ref 10–35)
Albumin: 4.2 g/dL (ref 3.6–5.1)
BILIRUBIN TOTAL: 0.6 mg/dL (ref 0.2–1.2)
BUN: 12 mg/dL (ref 7–25)
CALCIUM: 9 mg/dL (ref 8.6–10.4)
CO2: 30 mmol/L (ref 20–32)
Chloride: 100 mmol/L (ref 98–110)
Creat: 0.74 mg/dL (ref 0.60–0.88)
Globulin: 2.1 g/dL (calc) (ref 1.9–3.7)
Glucose, Bld: 94 mg/dL (ref 65–99)
Potassium: 3.8 mmol/L (ref 3.5–5.3)
SODIUM: 135 mmol/L (ref 135–146)
TOTAL PROTEIN: 6.3 g/dL (ref 6.1–8.1)

## 2017-01-22 LAB — CBC
HCT: 34.2 % — ABNORMAL LOW (ref 35.0–45.0)
Hemoglobin: 12.4 g/dL (ref 11.7–15.5)
MCH: 40.3 pg — ABNORMAL HIGH (ref 27.0–33.0)
MCHC: 36.3 g/dL — AB (ref 32.0–36.0)
MCV: 111 fL — ABNORMAL HIGH (ref 80.0–100.0)
MPV: 8.9 fL (ref 7.5–12.5)
PLATELETS: 447 10*3/uL — AB (ref 140–400)
RBC: 3.08 10*6/uL — ABNORMAL LOW (ref 3.80–5.10)
RDW: 13.7 % (ref 11.0–15.0)
WBC: 3.1 10*3/uL — AB (ref 3.8–10.8)

## 2017-01-22 NOTE — Progress Notes (Signed)
Dent presented to [facility], where she received full packet of [place] and completed distress screen per protocol.  The patient scored a 2 on the Psychosocial Distress Thermometer which indicates mild distress.   ONCBCN DISTRESS SCREENING 01/22/2017  Screening Type Initial Screening  Distress experienced in past week (1-10) 2  Emotional problem type Adjusting to illness  Information Concerns Type Lack of info about diagnosis;Lack of info about treatment;Lack of info about maintaining fitness  Referral to support programs Yes    Follow up needed: No. Pt screens out at this time, but please page if circumstances change or immediate needs arise. Thank you.   [place], [place], [facility] Pager [phone] Voicemail [phone]

## 2017-01-22 NOTE — Telephone Encounter (Signed)
No 1/16 los.  

## 2017-01-26 ENCOUNTER — Other Ambulatory Visit: Payer: Self-pay | Admitting: *Deleted

## 2017-01-26 DIAGNOSIS — F5101 Primary insomnia: Secondary | ICD-10-CM

## 2017-01-27 ENCOUNTER — Other Ambulatory Visit: Payer: Self-pay | Admitting: General Surgery

## 2017-01-27 ENCOUNTER — Encounter: Payer: Self-pay | Admitting: Internal Medicine

## 2017-01-27 DIAGNOSIS — C50512 Malignant neoplasm of lower-outer quadrant of left female breast: Secondary | ICD-10-CM

## 2017-01-27 DIAGNOSIS — Z170 Estrogen receptor positive status [ER+]: Principal | ICD-10-CM

## 2017-01-28 ENCOUNTER — Telehealth: Payer: Self-pay

## 2017-01-28 NOTE — Telephone Encounter (Signed)
Spoke with the patient and she is happy to know that she wouldn't be getting charged the labs. Orders have been cancelled. Patient understands that the lipid panel would be discussed in her next office visit.

## 2017-01-29 ENCOUNTER — Telehealth: Payer: Self-pay | Admitting: *Deleted

## 2017-01-29 DIAGNOSIS — Z170 Estrogen receptor positive status [ER+]: Principal | ICD-10-CM

## 2017-01-29 DIAGNOSIS — C50211 Malignant neoplasm of upper-inner quadrant of right female breast: Secondary | ICD-10-CM

## 2017-01-29 NOTE — Telephone Encounter (Signed)
Spoke to pt concerning [facility] from 11/11/17. Denies questions or concerns regarding dx or treatment care plan. Encourage pt to call with needs. Received verbal understanding. Contact information provided. 

## 2017-01-30 ENCOUNTER — Encounter: Payer: Self-pay | Admitting: Radiation Oncology

## 2017-01-30 ENCOUNTER — Telehealth: Payer: Self-pay | Admitting: Hematology

## 2017-01-30 NOTE — Telephone Encounter (Signed)
Left voicemail for patient with POST OP appointment Date/Time per sched message 1/24

## 2017-02-04 ENCOUNTER — Other Ambulatory Visit: Payer: Self-pay | Admitting: Nurse Practitioner

## 2017-02-12 ENCOUNTER — Encounter (HOSPITAL_BASED_OUTPATIENT_CLINIC_OR_DEPARTMENT_OTHER): Payer: Self-pay | Admitting: *Deleted

## 2017-02-12 ENCOUNTER — Other Ambulatory Visit: Payer: Self-pay

## 2017-02-12 NOTE — Progress Notes (Signed)
Pt currently taking hydroxyurea for Thrombocytopenia, Message left with Dr. Beryle Beams regarding holding medication for surgery.  Will call patient back with recommendation.

## 2017-02-16 ENCOUNTER — Encounter (HOSPITAL_BASED_OUTPATIENT_CLINIC_OR_DEPARTMENT_OTHER)
Admission: RE | Admit: 2017-02-16 | Discharge: 2017-02-16 | Disposition: A | Discharge status: Home / Self Care | Payer: Medicare Other | Source: Ambulatory Visit | Attending: General Surgery | Admitting: General Surgery

## 2017-02-16 DIAGNOSIS — Z01810 Encounter for preprocedural cardiovascular examination: Secondary | ICD-10-CM | POA: Insufficient documentation

## 2017-02-16 DIAGNOSIS — I10 Essential (primary) hypertension: Secondary | ICD-10-CM | POA: Insufficient documentation

## 2017-02-16 NOTE — Progress Notes (Signed)
EKG reviewed and approved by Dr. Rodman Comp.  Ensure given and instructed to drink by 0400 day of surgery.

## 2017-02-18 ENCOUNTER — Ambulatory Visit
Admission: RE | Admit: 2017-02-18 | Discharge: 2017-02-18 | Disposition: A | Discharge status: Home / Self Care | Payer: Medicare Other | Source: Ambulatory Visit | Attending: General Surgery | Admitting: General Surgery

## 2017-02-18 DIAGNOSIS — Z170 Estrogen receptor positive status [ER+]: Principal | ICD-10-CM

## 2017-02-18 DIAGNOSIS — C50512 Malignant neoplasm of lower-outer quadrant of left female breast: Secondary | ICD-10-CM

## 2017-02-20 ENCOUNTER — Encounter (HOSPITAL_BASED_OUTPATIENT_CLINIC_OR_DEPARTMENT_OTHER): Payer: Self-pay | Admitting: Emergency Medicine

## 2017-02-20 ENCOUNTER — Ambulatory Visit (HOSPITAL_BASED_OUTPATIENT_CLINIC_OR_DEPARTMENT_OTHER): Payer: Medicare Other | Admitting: Certified Registered"

## 2017-02-20 ENCOUNTER — Ambulatory Visit
Admission: RE | Admit: 2017-02-20 | Discharge: 2017-02-20 | Disposition: A | Discharge status: Home / Self Care | Payer: Medicare Other | Source: Ambulatory Visit | Attending: General Surgery | Admitting: General Surgery

## 2017-02-20 ENCOUNTER — Ambulatory Visit (HOSPITAL_BASED_OUTPATIENT_CLINIC_OR_DEPARTMENT_OTHER)
Admission: RE | Admit: 2017-02-20 | Discharge: 2017-02-20 | Disposition: A | Discharge status: Home / Self Care | Payer: Medicare Other | Source: Ambulatory Visit | Attending: General Surgery | Admitting: General Surgery

## 2017-02-20 ENCOUNTER — Encounter (HOSPITAL_BASED_OUTPATIENT_CLINIC_OR_DEPARTMENT_OTHER)
Admission: RE | Disposition: A | Discharge status: Home / Self Care | Payer: Self-pay | Source: Ambulatory Visit | Attending: General Surgery

## 2017-02-20 ENCOUNTER — Ambulatory Visit: Admit: 2017-02-20 | Payer: Medicare Other

## 2017-02-20 ENCOUNTER — Other Ambulatory Visit: Payer: Self-pay

## 2017-02-20 DIAGNOSIS — Z85820 Personal history of malignant melanoma of skin: Secondary | ICD-10-CM | POA: Insufficient documentation

## 2017-02-20 DIAGNOSIS — Z79899 Other long term (current) drug therapy: Secondary | ICD-10-CM | POA: Diagnosis not present

## 2017-02-20 DIAGNOSIS — Z7982 Long term (current) use of aspirin: Secondary | ICD-10-CM | POA: Diagnosis not present

## 2017-02-20 DIAGNOSIS — Z8249 Family history of ischemic heart disease and other diseases of the circulatory system: Secondary | ICD-10-CM | POA: Insufficient documentation

## 2017-02-20 DIAGNOSIS — K219 Gastro-esophageal reflux disease without esophagitis: Secondary | ICD-10-CM | POA: Diagnosis not present

## 2017-02-20 DIAGNOSIS — I10 Essential (primary) hypertension: Secondary | ICD-10-CM | POA: Insufficient documentation

## 2017-02-20 DIAGNOSIS — Z853 Personal history of malignant neoplasm of breast: Secondary | ICD-10-CM

## 2017-02-20 DIAGNOSIS — Z8261 Family history of arthritis: Secondary | ICD-10-CM | POA: Diagnosis not present

## 2017-02-20 DIAGNOSIS — Z87891 Personal history of nicotine dependence: Secondary | ICD-10-CM | POA: Insufficient documentation

## 2017-02-20 DIAGNOSIS — Z811 Family history of alcohol abuse and dependence: Secondary | ICD-10-CM | POA: Insufficient documentation

## 2017-02-20 DIAGNOSIS — C50211 Malignant neoplasm of upper-inner quadrant of right female breast: Secondary | ICD-10-CM | POA: Diagnosis present

## 2017-02-20 DIAGNOSIS — Z9889 Other specified postprocedural states: Secondary | ICD-10-CM | POA: Insufficient documentation

## 2017-02-20 DIAGNOSIS — C50512 Malignant neoplasm of lower-outer quadrant of left female breast: Secondary | ICD-10-CM | POA: Diagnosis present

## 2017-02-20 DIAGNOSIS — D0512 Intraductal carcinoma in situ of left breast: Secondary | ICD-10-CM | POA: Insufficient documentation

## 2017-02-20 DIAGNOSIS — D0511 Intraductal carcinoma in situ of right breast: Secondary | ICD-10-CM | POA: Insufficient documentation

## 2017-02-20 DIAGNOSIS — Z170 Estrogen receptor positive status [ER+]: Secondary | ICD-10-CM | POA: Insufficient documentation

## 2017-02-20 HISTORY — DX: Adverse effect of unspecified anesthetic, initial encounter: T41.45XA

## 2017-02-20 HISTORY — DX: Other complications of anesthesia, initial encounter: T88.59XA

## 2017-02-20 HISTORY — PX: BREAST LUMPECTOMY WITH RADIOACTIVE SEED LOCALIZATION: SHX6424

## 2017-02-20 HISTORY — PX: BREAST LUMPECTOMY: SHX2

## 2017-02-20 SURGERY — BREAST LUMPECTOMY
Anesthesia: General | Site: Breast | Laterality: Right

## 2017-02-20 MED ORDER — CEFAZOLIN SODIUM-DEXTROSE 2-4 GM/100ML-% IV SOLN
2.0000 g | INTRAVENOUS | Status: AC
  Administered 2017-02-20: 2 g via INTRAVENOUS

## 2017-02-20 MED ORDER — HYDROMORPHONE HCL 1 MG/ML IJ SOLN
0.2500 mg | INTRAMUSCULAR | Status: DC | PRN
  Administered 2017-02-20: 0.5 mg via INTRAVENOUS

## 2017-02-20 MED ORDER — ONDANSETRON HCL 4 MG/2ML IJ SOLN
INTRAMUSCULAR | Status: AC
  Filled 2017-02-20: qty 2

## 2017-02-20 MED ORDER — HYDROCODONE-ACETAMINOPHEN 5-325 MG PO TABS: 1.0000 | ORAL_TABLET | Freq: Four times a day (QID) | ORAL | 0 refills | Status: DC | PRN

## 2017-02-20 MED ORDER — ACETAMINOPHEN 500 MG PO TABS
ORAL_TABLET | ORAL | Status: AC
  Filled 2017-02-20: qty 2

## 2017-02-20 MED ORDER — DEXAMETHASONE SODIUM PHOSPHATE 4 MG/ML IJ SOLN
INTRAMUSCULAR | Status: DC | PRN
  Administered 2017-02-20: 4 mg via INTRAVENOUS

## 2017-02-20 MED ORDER — CEFAZOLIN SODIUM-DEXTROSE 2-4 GM/100ML-% IV SOLN
INTRAVENOUS | Status: AC
  Filled 2017-02-20: qty 100

## 2017-02-20 MED ORDER — CHLORHEXIDINE GLUCONATE CLOTH 2 % EX PADS: 6.0000 | MEDICATED_PAD | Freq: Once | CUTANEOUS | Status: DC

## 2017-02-20 MED ORDER — GABAPENTIN 300 MG PO CAPS
ORAL_CAPSULE | ORAL | Status: AC
  Filled 2017-02-20: qty 1

## 2017-02-20 MED ORDER — HYDROMORPHONE HCL 1 MG/ML IJ SOLN
INTRAMUSCULAR | Status: AC
  Filled 2017-02-20: qty 0.5

## 2017-02-20 MED ORDER — SCOPOLAMINE 1 MG/3DAYS TD PT72: 1.0000 | MEDICATED_PATCH | Freq: Once | TRANSDERMAL | Status: DC | PRN

## 2017-02-20 MED ORDER — LIDOCAINE 2% (20 MG/ML) 5 ML SYRINGE
INTRAMUSCULAR | Status: AC
  Filled 2017-02-20: qty 5

## 2017-02-20 MED ORDER — BUPIVACAINE HCL (PF) 0.25 % IJ SOLN
INTRAMUSCULAR | Status: AC
  Filled 2017-02-20: qty 60

## 2017-02-20 MED ORDER — BUPIVACAINE-EPINEPHRINE (PF) 0.25% -1:200000 IJ SOLN
INTRAMUSCULAR | Status: DC | PRN
  Administered 2017-02-20: 20 mL via PERINEURAL

## 2017-02-20 MED ORDER — GABAPENTIN 300 MG PO CAPS
300.0000 mg | ORAL_CAPSULE | ORAL | Status: AC
  Administered 2017-02-20: 300 mg via ORAL

## 2017-02-20 MED ORDER — FENTANYL CITRATE (PF) 100 MCG/2ML IJ SOLN
INTRAMUSCULAR | Status: AC
  Filled 2017-02-20: qty 2

## 2017-02-20 MED ORDER — LIDOCAINE HCL (CARDIAC) 20 MG/ML IV SOLN
INTRAVENOUS | Status: DC | PRN
  Administered 2017-02-20: 100 mg via INTRAVENOUS

## 2017-02-20 MED ORDER — ACETAMINOPHEN 500 MG PO TABS
1000.0000 mg | ORAL_TABLET | ORAL | Status: AC
  Administered 2017-02-20: 1000 mg via ORAL

## 2017-02-20 MED ORDER — PROPOFOL 10 MG/ML IV BOLUS
INTRAVENOUS | Status: DC | PRN
  Administered 2017-02-20: 150 mg via INTRAVENOUS

## 2017-02-20 MED ORDER — PROPOFOL 10 MG/ML IV BOLUS
INTRAVENOUS | Status: AC
  Filled 2017-02-20: qty 20

## 2017-02-20 MED ORDER — BUPIVACAINE-EPINEPHRINE 0.25% -1:200000 IJ SOLN
INTRAMUSCULAR | Status: AC
  Filled 2017-02-20: qty 2

## 2017-02-20 MED ORDER — ONDANSETRON HCL 4 MG/2ML IJ SOLN
INTRAMUSCULAR | Status: DC | PRN
  Administered 2017-02-20: 4 mg via INTRAVENOUS

## 2017-02-20 MED ORDER — EPHEDRINE SULFATE 50 MG/ML IJ SOLN
INTRAMUSCULAR | Status: DC | PRN
  Administered 2017-02-20 (×2): 10 mg via INTRAVENOUS

## 2017-02-20 MED ORDER — DEXAMETHASONE SODIUM PHOSPHATE 10 MG/ML IJ SOLN
INTRAMUSCULAR | Status: AC
  Filled 2017-02-20: qty 1

## 2017-02-20 MED ORDER — FENTANYL CITRATE (PF) 100 MCG/2ML IJ SOLN
50.0000 ug | INTRAMUSCULAR | Status: AC | PRN
  Administered 2017-02-20 (×2): 25 ug via INTRAVENOUS
  Administered 2017-02-20: 50 ug via INTRAVENOUS

## 2017-02-20 MED ORDER — LIDOCAINE-EPINEPHRINE (PF) 1 %-1:200000 IJ SOLN
INTRAMUSCULAR | Status: AC
  Filled 2017-02-20: qty 60

## 2017-02-20 MED ORDER — MEPERIDINE HCL 25 MG/ML IJ SOLN: 6.2500 mg | INTRAMUSCULAR | Status: DC | PRN

## 2017-02-20 MED ORDER — ONDANSETRON HCL 4 MG/2ML IJ SOLN: 4.0000 mg | Freq: Once | INTRAMUSCULAR | Status: DC | PRN

## 2017-02-20 MED ORDER — MIDAZOLAM HCL 2 MG/2ML IJ SOLN: 1.0000 mg | INTRAMUSCULAR | Status: DC | PRN

## 2017-02-20 MED ORDER — LACTATED RINGERS IV SOLN
INTRAVENOUS | Status: DC
  Administered 2017-02-20 (×2): via INTRAVENOUS

## 2017-02-20 SURGICAL SUPPLY — 46 items
APPLIER CLIP 9.375 MED OPEN (MISCELLANEOUS)
BLADE SURG 15 STRL LF DISP TIS (BLADE) ×2 IMPLANT
BLADE SURG 15 STRL SS (BLADE) ×1
CANISTER SUC SOCK COL 7IN (MISCELLANEOUS) ×3 IMPLANT
CANISTER SUCT 1200ML W/VALVE (MISCELLANEOUS) ×3 IMPLANT
CHLORAPREP W/TINT 26ML (MISCELLANEOUS) ×3 IMPLANT
CLIP APPLIE 9.375 MED OPEN (MISCELLANEOUS) IMPLANT
CLIP VESOCCLUDE SM WIDE 6/CT (CLIP) ×6 IMPLANT
COVER BACK TABLE 60X90IN (DRAPES) ×3 IMPLANT
COVER MAYO STAND STRL (DRAPES) ×3 IMPLANT
COVER PROBE W GEL 5X96 (DRAPES) ×3 IMPLANT
DECANTER SPIKE VIAL GLASS SM (MISCELLANEOUS) ×3 IMPLANT
DERMABOND ADVANCED (GAUZE/BANDAGES/DRESSINGS) ×3
DERMABOND ADVANCED .7 DNX12 (GAUZE/BANDAGES/DRESSINGS) ×6 IMPLANT
DEVICE DUBIN W/COMP PLATE 8390 (MISCELLANEOUS) ×6 IMPLANT
DRAPE LAPAROSCOPIC ABDOMINAL (DRAPES) ×3 IMPLANT
DRAPE UTILITY XL STRL (DRAPES) ×3 IMPLANT
ELECT COATED BLADE 2.86 ST (ELECTRODE) ×3 IMPLANT
ELECT REM PT RETURN 9FT ADLT (ELECTROSURGICAL) ×3
ELECTRODE REM PT RTRN 9FT ADLT (ELECTROSURGICAL) ×2 IMPLANT
GLOVE BIO SURGEON STRL SZ 6.5 (GLOVE) ×3 IMPLANT
GLOVE BIO SURGEON STRL SZ7.5 (GLOVE) ×6 IMPLANT
GLOVE BIOGEL PI IND STRL 7.5 (GLOVE) ×2 IMPLANT
GLOVE BIOGEL PI INDICATOR 7.5 (GLOVE) ×1
GLOVE SURG SS PI 7.5 STRL IVOR (GLOVE) ×3 IMPLANT
GOWN STRL REUS W/ TWL LRG LVL3 (GOWN DISPOSABLE) ×4 IMPLANT
GOWN STRL REUS W/ TWL XL LVL3 (GOWN DISPOSABLE) ×2 IMPLANT
GOWN STRL REUS W/TWL LRG LVL3 (GOWN DISPOSABLE) ×2
GOWN STRL REUS W/TWL XL LVL3 (GOWN DISPOSABLE) ×1
ILLUMINATOR WAVEGUIDE N/F (MISCELLANEOUS) IMPLANT
KIT MARKER MARGIN INK (KITS) ×3 IMPLANT
LIGHT WAVEGUIDE WIDE FLAT (MISCELLANEOUS) IMPLANT
NEEDLE HYPO 25X1 1.5 SAFETY (NEEDLE) ×3 IMPLANT
NS IRRIG 1000ML POUR BTL (IV SOLUTION) ×3 IMPLANT
PACK BASIN DAY SURGERY FS (CUSTOM PROCEDURE TRAY) ×3 IMPLANT
PENCIL BUTTON HOLSTER BLD 10FT (ELECTRODE) ×3 IMPLANT
SLEEVE SCD COMPRESS KNEE MED (MISCELLANEOUS) ×3 IMPLANT
SPONGE LAP 18X18 X RAY DECT (DISPOSABLE) ×3 IMPLANT
SUT MON AB 4-0 PC3 18 (SUTURE) ×3 IMPLANT
SUT SILK 2 0 SH (SUTURE) ×3 IMPLANT
SUT VICRYL 3-0 CR8 SH (SUTURE) ×3 IMPLANT
SYR CONTROL 10ML LL (SYRINGE) ×3 IMPLANT
TOWEL OR 17X24 6PK STRL BLUE (TOWEL DISPOSABLE) ×3 IMPLANT
TOWEL OR NON WOVEN STRL DISP B (DISPOSABLE) ×3 IMPLANT
TUBE CONNECTING 20X1/4 (TUBING) ×3 IMPLANT
YANKAUER SUCT BULB TIP NO VENT (SUCTIONS) ×3 IMPLANT

## 2017-02-20 NOTE — H&P (Signed)
Jill Myers  Location: [facility] Patient #: [id number] DOB: 07/24/1930 Undefined / Language: Jill Myers / Race: White Female   History of Present Illness  The patient is a 84 year old female who presents with breast cancer. We are asked to see the patient in consultation by Dr. Sondra Come to evaluate her for a new cancer in both breasts. The patient is a 84 year old white female who presents with a palpable mass in the upper inner quadrant of the right breast. This measured 2cm. she was also found to have a 52m mass in the lower outer left breast. Both were biopsied. the right came back as a grade 3 IDC and the left as a grade 2 IDC. Both were ER and PR + and Her2 -. the right had a Ki 67 of 30% and the left of 10%. she has no family history of breast cancer. Both axillas looked negative.   Past Surgical History Breast Biopsy  Bilateral. Cataract Surgery  Bilateral. Gallbladder Surgery - Laparoscopic  Oral Surgery   Diagnostic Studies History  Colonoscopy  5-10 years ago Mammogram  within last year Pap Smear  1-5 years ago  Medication History Medications Reconciled  Social History  Alcohol use  Occasional alcohol use. Caffeine use  Coffee. No drug use  Tobacco use  Former smoker.  Family History  Alcohol Abuse  Brother, Father. Arthritis  Mother. Heart Disease  Brother.  Other Problems  Bladder Problems  Cholelithiasis  Diverticulosis  Gastroesophageal Reflux Disease  Heart murmur  High blood pressure  Lump In Breast  Melanoma     Review of Systems  General Not Present- Appetite Loss, Chills, Fatigue, Fever, Night Sweats, Weight Gain and Weight Loss. Skin Present- Dryness and New Lesions. Not Present- Change in Wart/Mole, Hives, Jaundice, Non-Healing Wounds, Rash and Ulcer. HEENT Present- Wears glasses/contact lenses. Not Present- Earache, Hearing Loss, Hoarseness, Nose Bleed, Oral Ulcers, Ringing in the Ears, Seasonal Allergies,  Sinus Pain, Sore Throat, Visual Disturbances and Yellow Eyes. Respiratory Present- Chronic Cough and Snoring. Not Present- Bloody sputum, Difficulty Breathing and Wheezing. Breast Present- Breast Mass. Not Present- Breast Pain, Nipple Discharge and Skin Changes. Cardiovascular Present- Leg Cramps. Not Present- Chest Pain, Difficulty Breathing Lying Down, Palpitations, Rapid Heart Rate, Shortness of Breath and Swelling of Extremities. Gastrointestinal Present- Constipation. Not Present- Abdominal Pain, Bloating, Bloody Stool, Change in Bowel Habits, Chronic diarrhea, Difficulty Swallowing, Excessive gas, Gets full quickly at meals, Hemorrhoids, Indigestion, Nausea, Rectal Pain and Vomiting. Female Genitourinary Present- Frequency and Urgency. Not Present- Nocturia, Painful Urination and Pelvic Pain. Musculoskeletal Not Present- Back Pain, Joint Pain, Joint Stiffness, Muscle Pain, Muscle Weakness and Swelling of Extremities. Neurological Present- Decreased Memory. Not Present- Fainting, Headaches, Numbness, Seizures, Tingling, Tremor, Trouble walking and Weakness. Psychiatric Not Present- Anxiety, Bipolar, Change in Sleep Pattern, Depression, Fearful and Frequent crying. Endocrine Not Present- Cold Intolerance, Excessive Hunger, Hair Changes, Heat Intolerance, Hot flashes and New Diabetes. Hematology Present- Blood Thinners. Not Present- Easy Bruising, Excessive bleeding, Gland problems, HIV and Persistent Infections.   Physical Exam  General Mental Status-Alert. General Appearance-Consistent with stated age. Hydration-Well hydrated. Voice-Normal.  Head and Neck Head-normocephalic, atraumatic with no lesions or palpable masses. Trachea-midline. Thyroid Gland Characteristics - normal size and consistency.  Eye Eyeball - Bilateral-Extraocular movements intact. Sclera/Conjunctiva - Bilateral-No scleral icterus.  Chest and Lung Exam Chest and lung exam reveals -quiet, even  and easy respiratory effort with no use of accessory muscles and on auscultation, normal breath sounds, no adventitious sounds  and normal vocal resonance. Inspection Chest Wall - Normal. Back - normal.  Breast Note: there is a 2 cm palpable mass in the upper inner right breast that feels very close to the skin and not tethered to the chest wall. There is no palpable mass in the left breast. There is no palpable axillary, supraclavicular, or cervical lymphadenopathy.   Cardiovascular Cardiovascular examination reveals -normal heart sounds, regular rate and rhythm with no murmurs and normal pedal pulses bilaterally.  Abdomen Inspection Inspection of the abdomen reveals - No Hernias. Skin - Scar - no surgical scars. Palpation/Percussion Palpation and Percussion of the abdomen reveal - Soft, Non Tender, No Rebound tenderness, No Rigidity (guarding) and No hepatosplenomegaly. Auscultation Auscultation of the abdomen reveals - Bowel sounds normal.  Neurologic Neurologic evaluation reveals -alert and oriented x 3 with no impairment of recent or remote memory. Mental Status-Normal.  Musculoskeletal Normal Exam - Left-Upper Extremity Strength Normal and Lower Extremity Strength Normal. Normal Exam - Right-Upper Extremity Strength Normal and Lower Extremity Strength Normal.  Lymphatic Head & Neck  General Head & Neck Lymphatics: Bilateral - Description - Normal. Axillary  General Axillary Region: Bilateral - Description - Normal. Tenderness - Non Tender. Femoral & Inguinal  Generalized Femoral & Inguinal Lymphatics: Bilateral - Description - Normal. Tenderness - Non Tender.    Assessment & Plan  MALIGNANT NEOPLASM OF UPPER-INNER QUADRANT OF RIGHT BREAST IN FEMALE, ESTROGEN RECEPTOR POSITIVE (C50.211) MALIGNANT NEOPLASM OF LOWER-OUTER QUADRANT OF LEFT BREAST OF FEMALE, ESTROGEN RECEPTOR POSITIVE (C50.512) Impression: the patient appears to have cancers in both breasts.  Since her axillary nodes are clinically negative and given her age she will not require a node evaluation. I have talked to her about the different options for treatment and at this point she favors breast conservation. I feel this is a reasonable way of treating her cancer. She will require a right breast lumpectomy and a left breast radioactive seed localized lumpectomy. I have discussed with her in detail the risks and benefits of the operation as well as some of the technical aspects and she understands and wishes to proceed.

## 2017-02-20 NOTE — Discharge Instructions (Signed)

## 2017-02-20 NOTE — Anesthesia Postprocedure Evaluation (Signed)
Anesthesia Post Note  Patient: Jill Myers  Procedure(s) Performed: RIGHT BREAST LUMPECTOMY (Right Breast) LEFT BREAST LUMPECTOMY WITH RADIOACTIVE SEED LOCALIZATION (Left Breast)     Patient location during evaluation: PACU Anesthesia Type: General Level of consciousness: awake and alert Pain management: pain level controlled Vital Signs Assessment: post-procedure vital signs reviewed and stable Respiratory status: spontaneous breathing, nonlabored ventilation, respiratory function stable and patient connected to nasal cannula oxygen Cardiovascular status: blood pressure returned to baseline and stable Postop Assessment: no apparent nausea or vomiting Anesthetic complications: no    Last Vitals:  Vitals:   02/20/17 1015 02/20/17 1100  BP: 136/72 (!) 156/78  Pulse: 85 80  Resp: 18 14  Temp:  36.6 C  SpO2: 92% 94%    Last Pain:  Vitals:   02/20/17 1100  TempSrc:   PainSc: 0-No pain                 Abhiram Criado DAVID

## 2017-02-20 NOTE — Op Note (Signed)
02/20/2017  8:59 AM  PATIENT:  Jill Myers  84 y.o. female  PRE-OPERATIVE DIAGNOSIS:  bilateral breast cancer  POST-OPERATIVE DIAGNOSIS:  bilateral breast cancer  PROCEDURE:  Procedure(s): RIGHT BREAST LUMPECTOMY (Right) LEFT BREAST LUMPECTOMY WITH RADIOACTIVE SEED LOCALIZATION (Left)  SURGEON:  Surgeon(s) and Role:    Jovita Kussmaul, MD - Primary  PHYSICIAN ASSISTANT:   ASSISTANTS: none   ANESTHESIA:   local and general  EBL:  minimal   BLOOD ADMINISTERED:none  DRAINS: none   LOCAL MEDICATIONS USED:  MARCAINE     SPECIMEN:  Source of Specimen:  left breast tissue and right breast tissue with additional inferior margin  DISPOSITION OF SPECIMEN:  PATHOLOGY  COUNTS:  YES  TOURNIQUET:  * No tourniquets in log *  DICTATION: .Dragon Dictation   After informed consent was obtained the patient was brought to the operating room and placed in the supine position on the operating table.  After adequate induction of general anesthesia the patient's bilateral breast area was prepped with ChloraPrep, allowed to dry, and draped in usual sterile manner.  Previously an I-125 seed was placed in the outer aspect of the left breast to mark an area of invasive breast cancer.  The patient also had a palpable cancer in the upper inner right breast.  Attention was first turned to the left breast.  The neoprobe was set to I-125 in the area of radioactivity was readily identified.  The area around this was infiltrated with quarter percent Marcaine.  An elliptical incision was made in the skin overlying the area of radioactivity with a 15 blade knife.  The incision was carried through the skin and subcutaneous tissue sharply with the electrocautery.  A circular portion of breast tissue was then excised sharply around the radioactive seed while checking the area of radioactivity frequently with the neoprobe.  This dissection was carried all the way to the chest wall.  Once the specimen was removed it  was oriented with the appropriate paint colors.  A specimen radiograph was obtained that showed the clip and seed to be near the center of the specimen.  The specimen was then sent to pathology for further evaluation.  Hemostasis was achieved using the Bovie electrocautery.  The cavity was marked with clips and irrigated with copious amounts of saline.  The deep layer of the wound was then closed with layers of interrupted 3-0 Vicryl stitches.  The skin was then closed with a running 4-0 Monocryl subcuticular stitch.  Dermabond dressings were applied.  Attention was then turned to the right breast.  The area of the cancer was palpable in the upper inner quadrant.  The area around this was infiltrated with quarter percent Marcaine.  An elliptical incision was made overlying the palpable mass with a 15 blade knife.  The incision was carried through the skin and subcutaneous tissue sharply with electrocautery.  While palpating the mass we dissected widely around it and the dissection was carried all the way to the chest wall.  Once the specimen was removed it was oriented with the appropriate paint colors.  The specimen felt close to the inferior margin so an additional inferior margin was taken sharply with the electrocautery and marked appropriately.  A specimen radiograph was obtained that showed the clip to be the near the center of the specimen.  The specimen was then sent to pathology for further evaluation.  Hemostasis was achieved using the Bovie electrocautery.  The wound was irrigated with saline and  marked with clips.  The deep layer of the wound was then closed with layers of interrupted 3-0 Vicryl stitches.  The skin was then closed with a running 4-0 Monocryl subcuticular stitch.  Dermabond dressings were applied.  The patient tolerated the procedure well.  At the end of the case all needle sponge and instrument counts were correct.  The patient was then awakened and taken to recovery in stable  condition.  PLAN OF CARE: Discharge to home after PACU  PATIENT DISPOSITION:  PACU - hemodynamically stable.   Delay start of Pharmacological VTE agent (>24hrs) due to surgical blood loss or risk of bleeding: not applicable

## 2017-02-20 NOTE — Transfer of Care (Signed)
Immediate Anesthesia Transfer of Care Note  Patient: Jill Myers  Procedure(s) Performed: RIGHT BREAST LUMPECTOMY (Right Breast) LEFT BREAST LUMPECTOMY WITH RADIOACTIVE SEED LOCALIZATION (Left Breast)  Patient Location: PACU  Anesthesia Type:General  Level of Consciousness: sedated  Airway & Oxygen Therapy: Patient Spontanous Breathing and Patient connected to face mask oxygen  Post-op Assessment: Report given to RN and Post -op Vital signs reviewed and stable  Post vital signs: Reviewed and stable  Last Vitals:  Vitals:   02/20/17 0905 02/20/17 0906  BP: 115/61   Pulse:  77  Resp:  10  Temp:    SpO2:  97%    Last Pain:  Vitals:   02/20/17 0642  TempSrc: Oral         Complications: No apparent anesthesia complications

## 2017-02-20 NOTE — Anesthesia Preprocedure Evaluation (Signed)
Anesthesia Evaluation  Patient identified by MRN, date of birth, ID band Patient awake    Reviewed: Allergy & Precautions, NPO status , Patient's Chart, lab work & pertinent test results  Airway Mallampati: I  TM Distance: >3 FB Neck ROM: Full    Dental   Pulmonary former smoker,    Pulmonary exam normal        Cardiovascular hypertension, Pt. on medications Normal cardiovascular exam     Neuro/Psych    GI/Hepatic GERD  Medicated and Controlled,  Endo/Other    Renal/GU      Musculoskeletal   Abdominal   Peds  Hematology   Anesthesia Other Findings   Reproductive/Obstetrics                             Anesthesia Physical Anesthesia Plan  ASA: II  Anesthesia Plan: General   Post-op Pain Management:    Induction: Intravenous  PONV Risk Score and Plan: 3 and Midazolam, Dexamethasone and Ondansetron  Airway Management Planned: LMA  Additional Equipment:   Intra-op Plan:   Post-operative Plan: Extubation in OR  Informed Consent: I have reviewed the patients History and Physical, chart, labs and discussed the procedure including the risks, benefits and alternatives for the proposed anesthesia with the patient or authorized representative who has indicated his/her understanding and acceptance.     Plan Discussed with: Surgeon and CRNA  Anesthesia Plan Comments:         Anesthesia Quick Evaluation

## 2017-02-20 NOTE — Anesthesia Procedure Notes (Signed)
Procedure Name: LMA Insertion Date/Time: 02/20/2017 7:44 AM Performed by: Maryella Shivers, CRNA Pre-anesthesia Checklist: Patient identified, Emergency Drugs available, Suction available and Patient being monitored Patient Re-evaluated:Patient Re-evaluated prior to induction Oxygen Delivery Method: Circle system utilized Preoxygenation: Pre-oxygenation with 100% oxygen Induction Type: IV induction Ventilation: Mask ventilation without difficulty LMA: LMA inserted LMA Size: 4.0 Number of attempts: 1 Airway Equipment and Method: Bite block Placement Confirmation: positive ETCO2 Tube secured with: Tape Dental Injury: Teeth and Oropharynx as per pre-operative assessment

## 2017-02-20 NOTE — Interval H&P Note (Signed)
History and Physical Interval Note:  02/20/2017 7:29 AM  Jill Myers  has presented today for surgery, with the diagnosis of bilateral breast cancer  The various methods of treatment have been discussed with the patient and family. After consideration of risks, benefits and other options for treatment, the patient has consented to  Procedure(s): RIGHT BREAST LUMPECTOMY (Right) LEFT BREAST LUMPECTOMY WITH RADIOACTIVE SEED LOCALIZATION (Left) as a surgical intervention .  The patient's history has been reviewed, patient examined, no change in status, stable for surgery.  I have reviewed the patient's chart and labs.  Questions were answered to the patient's satisfaction.     TOTH III,PAUL S

## 2017-02-23 ENCOUNTER — Encounter (HOSPITAL_BASED_OUTPATIENT_CLINIC_OR_DEPARTMENT_OTHER): Payer: Self-pay | Admitting: General Surgery

## 2017-03-06 ENCOUNTER — Telehealth: Payer: Self-pay | Admitting: Hematology

## 2017-03-06 ENCOUNTER — Inpatient Hospital Stay: Payer: Medicare Other | Attending: Hematology | Admitting: Nurse Practitioner

## 2017-03-06 ENCOUNTER — Encounter: Payer: Self-pay | Admitting: Nurse Practitioner

## 2017-03-06 ENCOUNTER — Ambulatory Visit: Payer: Medicare Other | Admitting: Hematology

## 2017-03-06 VITALS — BP 154/72 | HR 74 | Temp 98.4°F | Resp 20 | Ht 63.0 in | Wt 135.5 lb

## 2017-03-06 DIAGNOSIS — Z7982 Long term (current) use of aspirin: Secondary | ICD-10-CM

## 2017-03-06 DIAGNOSIS — K219 Gastro-esophageal reflux disease without esophagitis: Secondary | ICD-10-CM | POA: Insufficient documentation

## 2017-03-06 DIAGNOSIS — Z170 Estrogen receptor positive status [ER+]: Secondary | ICD-10-CM | POA: Diagnosis not present

## 2017-03-06 DIAGNOSIS — Z79811 Long term (current) use of aromatase inhibitors: Secondary | ICD-10-CM | POA: Insufficient documentation

## 2017-03-06 DIAGNOSIS — I10 Essential (primary) hypertension: Secondary | ICD-10-CM | POA: Diagnosis not present

## 2017-03-06 DIAGNOSIS — C50412 Malignant neoplasm of upper-outer quadrant of left female breast: Secondary | ICD-10-CM | POA: Diagnosis not present

## 2017-03-06 DIAGNOSIS — J069 Acute upper respiratory infection, unspecified: Secondary | ICD-10-CM | POA: Insufficient documentation

## 2017-03-06 DIAGNOSIS — K5909 Other constipation: Secondary | ICD-10-CM | POA: Insufficient documentation

## 2017-03-06 DIAGNOSIS — Z79899 Other long term (current) drug therapy: Secondary | ICD-10-CM | POA: Diagnosis not present

## 2017-03-06 DIAGNOSIS — K909 Intestinal malabsorption, unspecified: Secondary | ICD-10-CM | POA: Diagnosis not present

## 2017-03-06 DIAGNOSIS — M8580 Other specified disorders of bone density and structure, unspecified site: Secondary | ICD-10-CM | POA: Diagnosis not present

## 2017-03-06 DIAGNOSIS — Z9889 Other specified postprocedural states: Secondary | ICD-10-CM | POA: Diagnosis not present

## 2017-03-06 DIAGNOSIS — C50211 Malignant neoplasm of upper-inner quadrant of right female breast: Secondary | ICD-10-CM | POA: Insufficient documentation

## 2017-03-06 NOTE — Progress Notes (Addendum)
[facility]  [phone] [phone]  Clinic Follow up Note   Patient Care Team: [name], MD as PCP - General (Internal Medicine) Annia Belt, MD as Consulting Physician (Oncology) [name], MD as Consulting Physician (Dermatology) [name], MD as Consulting Physician (Sports Medicine) [name], MD (Inactive) as Consulting Physician (Family Medicine) Jovita Kussmaul, MD as Consulting Physician (General Surgery) Raye Sorrow, MD as Consulting Physician (Pulmonary Disease) Truitt Merle, MD as Consulting Physician (Hematology) Gery Pray, MD as Consulting Physician (Radiation Oncology) 03/06/2017  SUMMARY OF ONCOLOGIC HISTORY: Oncology History   Cancer Staging Malignant neoplasm of upper-inner quadrant of right breast in female, estrogen receptor positive ([place]) Staging form: Breast, AJCC 8th Edition - Clinical stage from 01/07/2017: Stage IA (cT1c, cN0, cM0, G3, ER: Positive, PR: Positive, HER2: Negative) - Signed by Truitt Merle, MD on 01/20/2017  Malignant neoplasm of upper-outer quadrant of left breast in female, estrogen receptor positive ([place]) Staging form: Breast, AJCC 8th Edition - Clinical stage from 01/07/2017: Stage IA (cT1b, cN0, cM0, G2, ER: Positive, PR: Positive, HER2: Negative) - Signed by Truitt Merle, MD on 01/20/2017       Malignant neoplasm of upper-inner quadrant of right breast in female, estrogen receptor positive ([place])   01/01/2017 Mammogram    IMPRESSION: 1. 2.0 cm mass in the 2 o'clock position of the right breast with imaging features highly suspicious for malignancy. 2. 0.7 cm mass in the 2 o'clock position of the left breast with imaging features highly suspicious for malignancy. 3. No abnormal appearing axillary lymph nodes on either side.      01/07/2017 Receptors her2    Right Breast: Prognostic indicators significant for: ER, 95% positive and PR, 50% positive, both with strong staining  intensity. Proliferation marker Ki67 at 30. HER2 negative. RATIO OF HER2/CEP17 SIGNALS 0.81 AVERAGE HER2 COPY NUMBER PER CELL 1.70  Left Breast: Prognostic indicators significant for: ER, 100% positive and PR, 100% positive, both with strong staining intensity. Proliferation marker Ki67 at 10. HER2 negative. RATIO OF HER2/CEP17 SIGNALS 1.75 AVERAGE HER2 COPY NUMBER PER CELL 3.15      01/07/2017 Initial Biopsy    Diagnosis 1. Breast, right, needle core biopsy, 2:00 o'clock - INVASIVE DUCTAL CARCINOMA, SEE COMMENT. 2. Breast, left, needle core biopsy, 2:00 o'clock - INVASIVE MAMMARY CARCINOMA, SEE COMMENT.       01/20/2017 Initial Diagnosis    Malignant neoplasm of upper-inner quadrant of right breast in female, estrogen receptor positive ([place])      02/20/2017 Surgery    RIGHT BREAST LUMPECTOMY (Right) LEFT BREAST LUMPECTOMY WITH RADIOACTIVE SEED LOCALIZATION (Left)  Per Dr. Marlou Starks       02/20/2017 Pathology Results     Diagnosis 1. Breast, lumpectomy, Left w/ seed - INVASIVE DUCTAL CARCINOMA, GRADE I/III, SPANNING 1.1 CM. - THE SURGICAL RESECTION MARGINS ARE NEGATIVE FOR CARCINOMA. - SEE ONCOLOGY TABLE BELOW. 2. Breast, lumpectomy, Right - INVASIVE DUCTAL CARCINOMA, GRADE II/III, SPANNING 2.1 CM. - DUCTAL CARCINOMA IN SITU, INTERMEDIATE GRADE. - PERINEURAL INVASION IS IDENTIFIED. - THE SURGICAL RESECTION MARGINS ARE NEGATIVE FOR CARCINOMA. - SEE ONCOLOGY TABLE BELOW. 3. Breast, excision, Right inferior - LOBULAR NEOPLASIA (ATYPICAL LOBULAR HYPERPLASIA). - FAT NECROSIS. - SEE COMMENT.  Microscopic Comment 1. BREAST, INVASIVE TUMOR (LEFT) Procedure: Seed localized lumpectomy Laterality: Left Tumor Size: 1.1 cm (gross measurement). Histologic Type: Ductal Grade: I Tubular Differentiation: 2 Nuclear Pleomorphism: 1 Mitotic Count: 1 Ductal Carcinoma in Situ (DCIS): Not identified Extent of Tumor: Confined to breast parenchyma.  Margins: Greater than 0.2 cm to all  margins. Regional Lymph Nodes: None examined. Breast Prognostic Profile: Case [id number] Estrogen Receptor: 100%, strong Progesterone Receptor: 100%, strong Her2: No amplification was detected. The ratio was 0.81 Ki-67: 10% Best tumor block for sendout testing: 1B Pathologic Stage Classification (pTNM, AJCC 8th Edition): Primary Tumor (pT): pT1c Regional Lymph Nodes (pN): pNX Distant Metastases (pM): pMX  2. BREAST, INVASIVE TUMOR (RIGHT) Procedure: Lumpectomy Laterality: Right Tumor Size: 2.1 cm (gross measurement) Histologic Type: Ductal Grade: II Tubular Differentiation: 3 Nuclear Pleomorphism: 2 Mitotic Count: 1 Ductal Carcinoma in Situ (DCIS): Present, intermediate grade Extent of Tumor: Confined to breast parenchyma. Margins: Greater than 0.2 cm to all margins Regional Lymph Nodes: None examined. Breast Prognostic Profile: Case [id number] Estrogen Receptor: 95%, strong Progesterone Receptor: 50%, strong Her2: No amplification was detected. The ratio was 0.81 Ki-67: 30% Best tumor block for sendout testing: 2A Pathologic Stage Classification (pTNM, AJCC 8th Edition): Primary Tumor (pT): pT2 Regional Lymph Nodes (pN): pNX Distant Metastases (pM): pMX 3. The surgical resection margin(s) of the specimen were inked and microscopically evaluated.       Malignant neoplasm of upper-outer quadrant of left breast in female, estrogen receptor positive ([place])   01/20/2017 Initial Diagnosis    Malignant neoplasm of upper-outer quadrant of left breast in female, estrogen receptor positive ([place])      02/20/2017 Surgery    RIGHT BREAST LUMPECTOMY (Right) LEFT BREAST LUMPECTOMY WITH RADIOACTIVE SEED LOCALIZATION (Left)  Per Dr. Marlou Starks       CURRENT THERAPY: Anastrozole 1 mg daily; s/p bil lumpectomy; pending radiation therapy  INTERVAL HISTORY: Jill Myers returns as scheduled following breast surgery per Dr. Marlou Starks on 02/20/17. She recovered well, began exercising today  at her living community. She has very little breast pain, took NSAID for 2 days after surgery. Has full ROM in her arms. She is recovering from URI that began on 2/22 with clear rhinorrhea and dry cough. She denies fever, chills, sore throat, dyspnea, chest pain, or palpitations. Po intake is little less than usual; denies nausea, vomiting, or diarrhea. Has chronic constipation, takes colace. No blood in stool. No dysuria or hematuria. She began anastrozole before surgery and is tolerating it well, no arthralgia or hot flashes. She plans to f/u with Dr. Marlou Starks on 3/5, rad onc consult on 3/7.    MEDICAL HISTORY:  Past Medical History:  Diagnosis Date  . Bladder cystocele 05/21/2010  . Complication of anesthesia    deglutition in history  . Deglutition disorder 08/17/2012   Single episode of choking/aspiration; preceded by trouble swallowing saliva (thin liquids).  For SLP evaluation. Aug 17, 2012.    [name] Disease of blood and blood forming organ 01/05/2007   Qualifier: Diagnosis of  By: Oneida Alar MD, KARL    . Diverticulitis   . DIVERTICULOSIS OF COLON 03/05/2006   Qualifier: Diagnosis of  By: Samara Snide    . DIZZINESS 01/18/2010   Qualifier: Diagnosis of  By: Lindell Noe MD, Jeneen Rinks    . Essential hypertension, benign 02/22/2008   Qualifier: Diagnosis of  By: Lindell Noe MD, Jeneen Rinks     . Essential thrombocythemia ([place]) 01/31/11  . Family history of Alzheimer's disease 11/11/2013   Patient with family history of Alzheimers disease. She herself is very highly functional.  Plans for future visit with MMSE as primary function of the visit.    [name] GERD (gastroesophageal reflux disease) 08/16/2010  . HALLUX VALGUS, ACQUIRED 04/10/2009   Qualifier: Diagnosis of  By: [name] CMA,, [place]    .  Hypertension   . HYPERTRIGLYCERIDEMIA 01/05/2007   Qualifier: Diagnosis of  By: Oneida Alar MD, KARL    . Insomnia 02/10/2014  . Left-sided chest wall pain 12/19/2011    L sided chest wall pain follows a dermatomal distribution and raises the  possibility of thoracic radiculopathic pain, which would be expected to improve with gabapentin as well. We discussed this at length and she will call to make me aware if this does not get better. Rib belt/girdle in the meantime; may consider rib films or CXR if not improving.     . Metatarsalgia of right foot 11/28/2013   Referral to [facility]   . Myeloma ([place])    prolefica  . Osteopenia   . Restless leg syndrome 06/09/2011  . Restless legs syndrome (RLS)   . URINARY INCONTINENCE, URGE, MILD 11/27/2009   Qualifier: Diagnosis of  By: Zebedee Iba NP, Manuela Schwartz    . Vitamin D deficiency 08/16/2010    SURGICAL HISTORY: Past Surgical History:  Procedure Laterality Date  . BIOPSY BREAST Left 1998   Dr. Rebekah Chesterfield  . BREAST EXCISIONAL BIOPSY    . BREAST LUMPECTOMY Right 02/20/2017   Procedure: RIGHT BREAST LUMPECTOMY;  Surgeon: Jovita Kussmaul, MD;  Location: [facility];  Service: General;  Laterality: Right;  . BREAST LUMPECTOMY WITH RADIOACTIVE SEED LOCALIZATION Left 02/20/2017   Procedure: LEFT BREAST LUMPECTOMY WITH RADIOACTIVE SEED LOCALIZATION;  Surgeon: Jovita Kussmaul, MD;  Location: [facility];  Service: General;  Laterality: Left;  . CATARACT EXTRACTION  2010   Dr. [name]  . CHOLECYSTECTOMY  1992  . COLONOSCOPY  2008  . MOHS SURGERY  2012   on face [facility] [place]  . NAILBED REPAIR  12/05/2010   Procedure: NAILBED REPAIR;  Surgeon: Cammie Sickle., MD;  Location: [facility];  Service: Orthopedics;  Laterality: Right;  full thickness nail biopsy right hallux  . torn tenden  2012   hand Dr. Orie Rout    I have reviewed the social history and family history with the patient and they are unchanged from previous note.  ALLERGIES:  is allergic to doxycycline; macrodantin [nitrofurantoin]; and sulfamethoxazole-trimethoprim.  MEDICATIONS:  Current Outpatient Medications  Medication Sig Dispense Refill  . anastrozole (ARIMIDEX) 1 MG tablet Take  1 tablet (1 mg total) by mouth daily. 30 tablet 3  . aspirin 81 MG tablet Take 81 mg by mouth daily.      . calcium carbonate (OSCAL) 1500 (600 Ca) MG TABS tablet Take 600 mg of elemental calcium by mouth daily.    . cholecalciferol (VITAMIN D) 1000 UNITS tablet Take 1,000 Units by mouth daily.    [name] docusate sodium (COLACE) 250 MG capsule Take 250 mg by mouth daily.    [name] gabapentin (NEURONTIN) 300 MG capsule TAKE ONE CAPSULE BY MOUTH EVERY MORNING 2 CAPS AT NOON AND 3 CAPS AT EVENING (Patient taking differently: pt take 1 in am and 1 at bedtime) 180 capsule 5  . Glucosamine-Chondroitin 750-600 MG TABS Take 1 tablet by mouth. Take one tablet daily    . hydrochlorothiazide (HYDRODIURIL) 25 MG tablet TAKE 1 TABLET BY MOUTH EVERY DAY 90 tablet 1  . hydroxyurea (HYDREA) 500 MG capsule TAKE 2 CAPSULES (1,000 MG TOTAL) BY MOUTH DAILY. MAY TAKE WITH FOOD TO MINIMIZE GI SIDE EFFECTS. 60 capsule 2  . ibuprofen (ADVIL,MOTRIN) 200 MG tablet Take 200 mg by mouth as needed.    . Multiple Vitamins-Minerals (MULTIVITAMIN WITH MINERALS) tablet Take 1 tablet by mouth  daily.    . omeprazole (PRILOSEC) 20 MG capsule Take 20 mg by mouth daily.    . polyethylene glycol (MIRALAX / GLYCOLAX) packet Take 17 g by mouth daily.      [name] pyridOXINE (VITAMIN B-6) 100 MG tablet Take 100 mg by mouth daily.    . solifenacin (VESICARE) 10 MG tablet Take 10 mg by mouth daily.    . Vitamins A D C (COD LIVER OIL PLUS VITAMIN C) CHEW Chew 1 tablet by mouth daily.    [name] HYDROcodone-acetaminophen (NORCO/VICODIN) 5-325 MG tablet Take 1-2 tablets by mouth every 6 (six) hours as needed for moderate pain or severe pain. 15 tablet 0   No current facility-administered medications for this visit.     PHYSICAL EXAMINATION: ECOG PERFORMANCE STATUS: 0 - Asymptomatic  Vitals:   03/06/17 1341  BP: (!) 154/72  Pulse: 74  Resp: 20  Temp: 98.4 F (36.9 C)  SpO2: 98%   Filed Weights   03/06/17 1341  Weight: 135 lb 8 oz (61.5 kg)     GENERAL:alert, no distress and comfortable SKIN: skin color, texture, turgor are normal, no rashes or significant lesions EYES: normal, Conjunctiva are pink and non-injected, sclera clear OROPHARYNX:no exudate, no erythema and lips, buccal mucosa, and tongue normal  LYMPH:  no palpable cervical, supraclavicular, or axillary lymphadenopathy LUNGS: clear to auscultation bilaterally with normal breathing effort HEART: regular rate & rhythm and no murmurs and no lower extremity edema ABDOMEN:abdomen soft, non-tender and normal bowel sounds Musculoskeletal:no cyanosis of digits and no clubbing  NEURO: alert & oriented x 3 with fluent speech, no focal motor/sensory deficits BREASTS: inspection shows them to be symmetrical without nipple discharge. (+) right breast s/p lumpectomy, upper inner quadrant incision is healing well, mild ecchymosis at incision; no erythema or drainage. (+) left breast s/p lumpectomy, outer left breast incision healing well, no erythema or drainage. No palpable mass in either breast or axilla that I could appreciate.    LABORATORY DATA:  I have reviewed the data as listed CBC Latest Ref Rng & Units 01/22/2017 01/21/2017 12/09/2016  WBC 3.8 - 10.8 Thousand/uL 3.1(L) 4.2 3.3(L)  Hemoglobin 11.7 - 15.5 g/dL 12.4 - 12.2  Hematocrit 35.0 - 45.0 % 34.2(L) 34.9 34.0(L)  Platelets 140 - 400 Thousand/uL 447(H) 437(H) 441(H)     CMP Latest Ref Rng & Units 01/22/2017 01/22/2017 01/21/2017  Glucose 65 - 99 mg/dL 94 - 100  BUN 7 - 25 mg/dL 12 - 13  Creatinine 0.60 - 0.88 mg/dL 0.74 - 0.82  Sodium 135 - 146 mmol/L 135 - 137  Potassium 3.5 - 5.3 mmol/L 3.8 - 3.4  Chloride 98 - 110 mmol/L 100 - 99  CO2 20 - 32 mmol/L 30 - 29  Calcium 8.6 - 10.4 mg/dL 9.0 - 8.6  Total Protein 6.1 - 8.1 g/dL 6.3 6.3 6.6  Total Bilirubin 0.2 - 1.2 mg/dL 0.6 0.6 0.5  Alkaline Phos 40 - 150 U/L - - 48  AST 10 - 35 U/L 22 22 22   ALT 6 - 29 U/L 25 25 26    SURGICAL PATHOLOGY  Diagnosis 1. Breast,  lumpectomy, Left w/ seed - INVASIVE DUCTAL CARCINOMA, GRADE I/III, SPANNING 1.1 CM. - THE SURGICAL RESECTION MARGINS ARE NEGATIVE FOR CARCINOMA. - SEE ONCOLOGY TABLE BELOW. 2. Breast, lumpectomy, Right - INVASIVE DUCTAL CARCINOMA, GRADE II/III, SPANNING 2.1 CM. - DUCTAL CARCINOMA IN SITU, INTERMEDIATE GRADE. - PERINEURAL INVASION IS IDENTIFIED. - THE SURGICAL RESECTION MARGINS ARE NEGATIVE FOR CARCINOMA. - SEE ONCOLOGY TABLE  BELOW. 3. Breast, excision, Right inferior - LOBULAR NEOPLASIA (ATYPICAL LOBULAR HYPERPLASIA). - FAT NECROSIS. - SEE COMMENT.  Microscopic Comment 1. BREAST, INVASIVE TUMOR (LEFT) Procedure: Seed localized lumpectomy Laterality: Left Tumor Size: 1.1 cm (gross measurement). Histologic Type: Ductal Grade: I Tubular Differentiation: 2 Nuclear Pleomorphism: 1 Mitotic Count: 1 Ductal Carcinoma in Situ (DCIS): Not identified Extent of Tumor: Confined to breast parenchyma. Margins: Greater than 0.2 cm to all margins. Regional Lymph Nodes: None examined. Breast Prognostic Profile: Case [id number] Estrogen Receptor: 100%, strong Progesterone Receptor: 100%, strong Her2: No amplification was detected. The ratio was 0.81 Ki-67: 10% Best tumor block for sendout testing: 1B Pathologic Stage Classification (pTNM, AJCC 8th Edition): Primary Tumor (pT): pT1c Regional Lymph Nodes (pN): pNX Distant Metastases (pM): pMX  2. BREAST, INVASIVE TUMOR (RIGHT) Procedure: Lumpectomy Laterality: Right Tumor Size: 2.1 cm (gross measurement) Histologic Type: Ductal Grade: II Tubular Differentiation: 3 Nuclear Pleomorphism: 2 Mitotic Count: 1 Ductal Carcinoma in Situ (DCIS): Present, intermediate grade Extent of Tumor: Confined to breast parenchyma. Margins: Greater than 0.2 cm to all margins Regional Lymph Nodes: None examined. Breast Prognostic Profile: Case [id number] Estrogen Receptor: 95%, strong Progesterone Receptor: 50%, strong Her2: No amplification was  detected. The ratio was 0.81 Ki-67: 30% Best tumor block for sendout testing: 2A Pathologic Stage Classification (pTNM, AJCC 8th Edition): Primary Tumor (pT): pT2 Regional Lymph Nodes (pN): pNX Distant Metastases (pM): pMX 3. The surgical resection margin(s) of the specimen were inked and microscopically evaluated.  Enid Cutter MD Pathologist, Electronic Signature (Case signed 02/23/2017) Specimen Gross and Clinical Information RADIOGRAPHIC STUDIES: I have personally reviewed the radiological images as listed and agreed with the findings in the report. No results found.   ASSESSMENT & PLAN: 84 yo female   1.  Bilateral breast cancer, invasive ductal carcinoma, right upper inner quadrant, pT2cN0M0, stage IA, ER+/PR+/HER2-, G2, left upper-outer quadrant, pT1cN0M0, stage IA, ER+/PR+/HER2-, G1 -Jill Myers appears stable. She underwent bilateral lumpectomy on 02/20/17. She has recovered very well. We reviewed surgical pathology in detail, which shows invasive ductal carcinoma grade 1 spanning 1.1 cm with clear margins in the left breast; ER/PR positive, HER-2 negative.  In the right breast she has invasive ductal carcinoma grade 2 spanning 2.1 cm with intermediate grade DCIS and perineural invasion, also ER/PR positive, HER-2 negative; surgical margins negative.  Due to the high risk features in the right breast, she will meet with Dr. Sondra Come to discuss adjuvant radiation therapy.  She began anastrozole prior to surgery and is tolerating it well without side effects; she will continue for a total of 5 years to reduce the risk of cancer recurrence.  Will continue annual screening mammogram, self-exam, and routine office visit.  She will return in 3-4 months for follow-up and will be referred to survivorship clinic in the future.  2. Essential thrombocytopenia -on Hydrea per Dr. Beryle Beams; on aspirin for thrombus prophylaxis. Most recent plt count 437K on 01/2017. No CBC today, will monitor closely  with labs at next visit.  3. Osteopenia  -per DEXA on 11/2014, T score -2.2; on calcium and vitamin D; she engages in regular physical exercise   PLAN -Continue Anastrozole daily -Rad onc consult 3/7 -F/u Dr. Marlou Starks 3/5  -lab and f/u in 3-4 months  All questions were answered. The patient knows to call the clinic with any problems, questions or concerns. No barriers to learning was detected.    Alla Feeling, NP 03/06/17   Addendum  I have seen the patient, examined her. I  agree with the assessment and and plan and have edited the notes.   Jill Myers tolerated surgery well, we reviewed pathology. She had complete resection of b/l breast cancer. Due to her advanced age, SLN biopsy was not recommended. We discussed her case in tumor conference, adjuvant radiation to right breast was recommended. She will see Dr. Sondra Come. Due to her advanced age, I do not think she is a candidate for adjuvant chemo, so Oncotype was not done. I recommend her to continue adjuvant anastrozole for 5-7 years, she is tolerating well. I will see her back in 3-4 months.  Truitt Merle  03/06/2017

## 2017-03-06 NOTE — Telephone Encounter (Signed)
Appointments scheduled AVS/Calendar printed per 3/1 los °

## 2017-03-10 NOTE — Progress Notes (Signed)
Location of Breast Cancer: upper-inner quadrant of right breast and upper-outer quadrant of left breast    Histology per Pathology Report:   02/20/17 Diagnosis 1. Breast, lumpectomy, Left w/ seed - INVASIVE DUCTAL CARCINOMA, GRADE I/III, SPANNING 1.1 CM. - THE SURGICAL RESECTION MARGINS ARE NEGATIVE FOR CARCINOMA. - SEE ONCOLOGY TABLE BELOW. 2. Breast, lumpectomy, Right - INVASIVE DUCTAL CARCINOMA, GRADE II/III, SPANNING 2.1 CM. - DUCTAL CARCINOMA IN SITU, INTERMEDIATE GRADE. - PERINEURAL INVASION IS IDENTIFIED. - THE SURGICAL RESECTION MARGINS ARE NEGATIVE FOR CARCINOMA. - SEE ONCOLOGY TABLE BELOW. 3. Breast, excision, Right inferior - LOBULAR NEOPLASIA (ATYPICAL LOBULAR HYPERPLASIA). - FAT NECROSIS. - SEE COMMENT.  01/07/17 Diagnosis 1. Breast, right, needle core biopsy, 2:00 o'clock - INVASIVE DUCTAL CARCINOMA, SEE COMMENT. 2. Breast, left, needle core biopsy, 2:00 o'clock - INVASIVE MAMMARY CARCINOMA, SEE COMMENT.  Receptor Status: right breast: ER(95%), PR (50%), Her2-neu (negative), Ki-(30%), left breast ER (100%), PR (100%), Her2-neu (negative), Ki-(10%)  Did patient present with symptoms (if so, please note symptoms) or was this found on screening mammography?: patient palpated a mass herself  Past/Anticipated interventions by surgeon, if any: 02/20/17 - Procedure: LEFT BREAST LUMPECTOMY WITH RADIOACTIVE SEED LOCALIZATION and right breast lumpectomy;  Surgeon: Jovita Kussmaul, MD  Past/Anticipated interventions by medical oncology, if FBP:ZWCHENID anastrozole for 5-7 years  Lymphedema issues, if any:  no}   Pain issues, if any:  no   SAFETY ISSUES:  Prior radiation? no  Pacemaker/ICD? no  Possible current pregnancy?no  Is the patient on methotrexate? no  Current Complaints / other details:    BP (!) 163/76 (BP Location: Right Arm, Patient Position: Sitting)   Pulse 79   Temp 98.6 F (37 C) (Oral)   Ht 5' 3"  (1.6 m)   Wt 134 lb 6.4 oz (61 kg)   SpO2 97%    BMI 23.81 kg/m    Wt Readings from Last 3 Encounters:  03/12/17 134 lb 6.4 oz (61 kg)  03/06/17 135 lb 8 oz (61.5 kg)  02/20/17 136 lb (61.7 kg)

## 2017-03-12 ENCOUNTER — Ambulatory Visit
Admission: RE | Admit: 2017-03-12 | Discharge: 2017-03-12 | Disposition: A | Discharge status: Home / Self Care | Payer: Medicare Other | Source: Ambulatory Visit | Attending: Radiation Oncology | Admitting: Radiation Oncology

## 2017-03-12 ENCOUNTER — Other Ambulatory Visit: Payer: Self-pay

## 2017-03-12 ENCOUNTER — Telehealth: Payer: Self-pay | Admitting: Oncology

## 2017-03-12 ENCOUNTER — Encounter: Payer: Self-pay | Admitting: Radiation Oncology

## 2017-03-12 VITALS — BP 163/76 | HR 79 | Temp 98.6°F | Ht 63.0 in | Wt 134.4 lb

## 2017-03-12 DIAGNOSIS — Z888 Allergy status to other drugs, medicaments and biological substances status: Secondary | ICD-10-CM | POA: Diagnosis not present

## 2017-03-12 DIAGNOSIS — Z170 Estrogen receptor positive status [ER+]: Secondary | ICD-10-CM | POA: Insufficient documentation

## 2017-03-12 DIAGNOSIS — Z79899 Other long term (current) drug therapy: Secondary | ICD-10-CM | POA: Insufficient documentation

## 2017-03-12 DIAGNOSIS — Z881 Allergy status to other antibiotic agents status: Secondary | ICD-10-CM | POA: Insufficient documentation

## 2017-03-12 DIAGNOSIS — C50211 Malignant neoplasm of upper-inner quadrant of right female breast: Secondary | ICD-10-CM

## 2017-03-12 DIAGNOSIS — C50412 Malignant neoplasm of upper-outer quadrant of left female breast: Secondary | ICD-10-CM

## 2017-03-12 DIAGNOSIS — J029 Acute pharyngitis, unspecified: Secondary | ICD-10-CM | POA: Diagnosis not present

## 2017-03-12 DIAGNOSIS — Z7982 Long term (current) use of aspirin: Secondary | ICD-10-CM | POA: Insufficient documentation

## 2017-03-12 DIAGNOSIS — Z882 Allergy status to sulfonamides status: Secondary | ICD-10-CM | POA: Insufficient documentation

## 2017-03-12 DIAGNOSIS — Z791 Long term (current) use of non-steroidal anti-inflammatories (NSAID): Secondary | ICD-10-CM | POA: Diagnosis not present

## 2017-03-12 NOTE — Telephone Encounter (Signed)
Left message for patient regarding her appointment today with Dr. Sondra Come.  Requested a return call.

## 2017-03-12 NOTE — Progress Notes (Signed)
Radiation Oncology         [phone] ________________________________  Name: Jill Myers MRN: [id number]  Date: 03/12/2017  DOB: June 19, 1930  Re-evaluation Note  CC: [name], MD  Truitt Merle, MD    ICD-10-CM   1. Malignant neoplasm of upper-inner quadrant of right breast in female, estrogen receptor positive ([place]) C50.211    Z17.0   2. Malignant neoplasm of upper-outer quadrant of left breast in female, estrogen receptor positive ([place]) C50.412    Z17.0     Diagnosis:  Bilateral breast cancer, RIGHT  upper inner quadrant invasive ductal carcinoma pT2cN0M0, stage II-A, ER+/PR+/Her2-, Grade 2 ; LEFT upper outer invasive ductal carcinoma pT1cN0M0, stage IA, ER+/PR+/Her2-, Grade 1  Narrative:  The patient returns today for re-evaluation after breast clinic on 01/21/17. Since then, she underwent left breast lumpectomy and right breast lumpectomy on 02/20/17. Left breast revealed invasive ductal carcinoma, grade I/III, spanning 1.1 cm. Margins were negative. Right breast revealed invasive ductal carcinoma, grade II/III, spanning 2.1 cm. Intermediate grade DCIS was present. Perineural invasion was noted. Margins were negative. 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         The patient denies lymphedema or pain at this time. She reports she has had a very easy time with her surgery. She reports taking Ibuprofen several times following surgery due to a sore throat resulting from intubation. The patient began Anastrozole prior to surgery and she is tolerating it well.  She returns to radiation oncology to review the role of radiotherapy as part of her disease management.                         ALLERGIES:  is allergic to doxycycline; macrodantin [nitrofurantoin]; and sulfamethoxazole-trimethoprim.  Meds: Current Outpatient Medications  Medication Sig Dispense Refill  . anastrozole (ARIMIDEX) 1 MG tablet Take 1 tablet (1 mg total) by mouth daily. 30 tablet 3  .  aspirin 81 MG tablet Take 81 mg by mouth daily.      . calcium carbonate (OSCAL) 1500 (600 Ca) MG TABS tablet Take 600 mg of elemental calcium by mouth daily.    . cholecalciferol (VITAMIN D) 1000 UNITS tablet Take 1,000 Units by mouth daily.    [name] docusate sodium (COLACE) 250 MG capsule Take 250 mg by mouth daily.    [name] gabapentin (NEURONTIN) 300 MG capsule TAKE ONE CAPSULE BY MOUTH EVERY MORNING 2 CAPS AT NOON AND 3 CAPS AT EVENING (Patient taking differently: pt take 1 in am and 1 at bedtime) 180 capsule 5  . Glucosamine-Chondroitin 750-600 MG TABS Take 1 tablet by mouth. Take one tablet daily    . hydrochlorothiazide (HYDRODIURIL) 25 MG tablet TAKE 1 TABLET BY MOUTH EVERY DAY 90 tablet 1  . hydroxyurea (HYDREA) 500 MG capsule TAKE 2 CAPSULES (1,000 MG TOTAL) BY MOUTH DAILY. MAY TAKE WITH FOOD TO MINIMIZE GI SIDE EFFECTS. 60 capsule 2  . ibuprofen (ADVIL,MOTRIN) 200 MG tablet Take 200 mg by mouth as needed.    . Omega-3 Fatty Acids (FISH OIL PO) Take by mouth.    [name] omeprazole (PRILOSEC) 20 MG capsule Take 20 mg by mouth daily.    . polyethylene glycol (MIRALAX / GLYCOLAX) packet Take 17 g by mouth daily.      [name] pyridOXINE (VITAMIN B-6) 100 MG tablet Take 100 mg by mouth daily.    . solifenacin (VESICARE) 10 MG tablet Take 10 mg by mouth daily.  No current facility-administered medications for this encounter.     Physical Findings: The patient is in no acute distress. Patient is alert and oriented.  height is 5' 3"  (1.6 m) and weight is 134 lb 6.4 oz (61 kg). Her oral temperature is 98.6 F (37 C). Her blood pressure is 163/76 (abnormal) and her pulse is 79. Her oxygen saturation is 97%. .  No significant changes. Lungs are clear to auscultation bilaterally. Heart has regular rate and rhythm. No palpable cervical, supraclavicular, or axillary adenopathy. Abdomen soft, non-tender, normal bowel sounds. Left breast, the patient has a well healing scar in the upper outer quadrant without signs of  drainage or infection. Right breast, the patient has a well healing scar in the upper outer quadrant which is healing well without signs of drainage or infection.   Lab Findings: Lab Results  Component Value Date   WBC 3.1 (L) 01/22/2017   HGB 12.4 01/22/2017   HCT 34.2 (L) 01/22/2017   MCV 111.0 (H) 01/22/2017   PLT 447 (H) 01/22/2017    Radiographic Findings: Mm Breast Surgical Specimen  Result Date: 02/20/2017 CLINICAL DATA:  Status post surgical excision of the left breast following radioactive seed localization. EXAM: SPECIMEN RADIOGRAPH OF THE LEFT BREAST COMPARISON:  Previous exam(s). FINDINGS: Status post excision of the left breast. The radioactive seed and biopsy marker clip are present, completely intact, and were marked for pathology. IMPRESSION: Specimen radiograph of the left breast. Electronically Signed   By: Lajean Manes M.D.   On: 02/20/2017 08:12   Mm Lt Radioactive Seed Loc Mammo Guide  Result Date: 02/18/2017 CLINICAL DATA:  Localize left breast cancer. EXAM: MAMMOGRAPHIC GUIDED RADIOACTIVE SEED LOCALIZATION OF THE LEFT BREAST COMPARISON:  Previous exam(s). FINDINGS: Patient presents for radioactive seed localization prior to surgery. I met with the patient and we discussed the procedure of seed localization including benefits and alternatives. We discussed the high likelihood of a successful procedure. We discussed the risks of the procedure including infection, bleeding, tissue injury and further surgery. We discussed the low dose of radioactivity involved in the procedure. Informed, written consent was given. The usual time-out protocol was performed immediately prior to the procedure. Using mammographic guidance, sterile technique, 1% lidocaine and an I-125 radioactive seed, the biopsy clip was localized using a lateral approach. The follow-up mammogram images confirm the seed in the expected location and were marked for the surgeon. Follow-up survey of the patient  confirms presence of the radioactive seed. Order number of I-125 seed:  [id number]. Total activity:  5.809 millicuries reference Date: February 09, 2017 The patient tolerated the procedure well and was released from the [facility]. She was given instructions regarding seed removal. IMPRESSION: Radioactive seed localization left breast. No apparent complications. Electronically Signed   By: Dorise Bullion III M.D   On: 02/18/2017 13:37    Impression: Bilateral breast cancer, RIGHT  upper inner quadrant invasive ductal carcinoma pT2cN0M0, stage II-A, ER+/PR+/Her2-, Grade 2; k LEFT upper outer invasive ductal carcinoma pT1cN0M0, stage IA, ER+/PR+/Her2-, Grade 1  The patient does not require any additional treatment directed to the left breast;however, given the size of the lesion on the right side (2.1 cm), and nature of grade 2 with perineural invasion, I would recommend radiation to the right breast. We discussed the issues with covering the axilla. The patient wishes to be aggressive and receive full axillary radiation treatment at the same time as her right breast treatment. We discussed the course of treatment, side effects, and  long term toxicities of radiotherapy with the patient. She appears to understand and wishes to proceed with treatment. A consent form was signed and a copy was provided to the patient.   Plan:  CT simulation and treatment planning is scheduled for 03/23/17 or 03/24/17, she would prefer an 11 am appointment. Anticipate treatment to begin 5-6 weeks post-op.  ____________________________________    This document serves as a record of services personally performed by Gery Pray, MD. It was created on his behalf by Bethann Humble, a trained medical scribe. The creation of this record is based on the scribe's personal observations and the provider's statements to them. This document has been checked and approved by the attending provider.

## 2017-03-17 ENCOUNTER — Telehealth: Payer: Self-pay

## 2017-03-17 NOTE — Telephone Encounter (Signed)
Patient wanted to let you know that Jill Myers has resolved her labs.

## 2017-03-17 NOTE — Telephone Encounter (Signed)
Requesting a call back about lab.

## 2017-03-17 NOTE — Telephone Encounter (Signed)
What does that mean they resolved her labs? Did they find my order or do they need a new one? JG

## 2017-03-17 NOTE — Telephone Encounter (Signed)
Pt had questions about when next lab draw to be done at [facility]. Informed by looking at her chart, should be this month. But stated when she went to have it done, the nurse told her, she did not have her schedule. Pt stated she will look into it further and let me know if they need another order faxed to them. Also I will send a message to Tamela Oddi S to schedule an appt w/Dr G.

## 2017-03-20 NOTE — Telephone Encounter (Signed)
Called pt - no answer; left message if she needs a new order for labs or anything else , give me a call.

## 2017-03-23 ENCOUNTER — Ambulatory Visit
Admission: RE | Admit: 2017-03-23 | Discharge: 2017-03-23 | Disposition: A | Discharge status: Home / Self Care | Payer: Medicare Other | Source: Ambulatory Visit | Attending: Radiation Oncology | Admitting: Radiation Oncology

## 2017-03-23 DIAGNOSIS — Z510 Encounter for antineoplastic radiation therapy: Secondary | ICD-10-CM | POA: Insufficient documentation

## 2017-03-23 DIAGNOSIS — C50211 Malignant neoplasm of upper-inner quadrant of right female breast: Secondary | ICD-10-CM | POA: Diagnosis not present

## 2017-03-23 DIAGNOSIS — Z170 Estrogen receptor positive status [ER+]: Secondary | ICD-10-CM | POA: Diagnosis not present

## 2017-03-23 NOTE — Progress Notes (Signed)
  Radiation Oncology         (336) [phone] ________________________________  Name: Jill Myers MRN: [id number]  Date: 03/23/2017  DOB: July 17, 1930  SIMULATION AND TREATMENT PLANNING NOTE    ICD-10-CM   1. Malignant neoplasm of upper-inner quadrant of right breast in female, estrogen receptor positive ([place]) C50.211    Z17.0     DIAGNOSIS: Bilateral breast cancer, RIGHT  upper inner quadrant invasive ductal carcinoma pT2cN0M0, stage II-A, ER+/PR+/Her2-, Grade 2 ; LEFT upper outer invasive ductal carcinoma pT1cN0M0, stage IA, ER+/PR+/Her2-, Grade 1  NARRATIVE:  The patient was brought to the [facility].  Identity was confirmed.  All relevant records and images related to the planned course of therapy were reviewed.  The patient freely provided informed written consent to proceed with treatment after reviewing the details related to the planned course of therapy. The consent form was witnessed and verified by the simulation staff.  Then, the patient was set-up in a stable reproducible  supine position for radiation therapy.  CT images were obtained.  Surface markings were placed.  The CT images were loaded into the planning software.  Then the target and avoidance structures were contoured.  Treatment planning then occurred.  The radiation prescription was entered and confirmed.  Then, I designed and supervised the construction of a total of 5 medically necessary complex treatment devices.  I have requested : 3D Simulation  I have requested a DVH of the following structures: heart, lungs, and lumpectomy cavity.  I have ordered:dose calc.  PLAN:  The patient will receive 50 Gy in 25 fractions to the right breast, 45 Gy in 25 Gy to the right axilla, and 10 Gy in 5 fraction boost to the lumpectomy cavity.  -----------------------------------  Blair Promise, PhD, MD  This document serves as a record of services personally performed by Gery Pray, MD. It was created on his behalf by  Bethann Humble, a trained medical scribe. The creation of this record is based on the scribe's personal observations and the provider's statements to them. This document has been checked and approved by the attending provider.

## 2017-03-26 DIAGNOSIS — C50211 Malignant neoplasm of upper-inner quadrant of right female breast: Secondary | ICD-10-CM | POA: Diagnosis not present

## 2017-03-30 ENCOUNTER — Ambulatory Visit
Admission: RE | Admit: 2017-03-30 | Discharge: 2017-03-30 | Disposition: A | Discharge status: Home / Self Care | Payer: Medicare Other | Source: Ambulatory Visit | Attending: Radiation Oncology | Admitting: Radiation Oncology

## 2017-03-30 DIAGNOSIS — Z170 Estrogen receptor positive status [ER+]: Principal | ICD-10-CM

## 2017-03-30 DIAGNOSIS — C50211 Malignant neoplasm of upper-inner quadrant of right female breast: Secondary | ICD-10-CM | POA: Diagnosis not present

## 2017-03-30 NOTE — Progress Notes (Signed)
  Radiation Oncology         [phone] ________________________________  Name: Jill Myers MRN: [id number]  Date: 03/30/2017  DOB: 1930-04-10  Simulation Verification Note    ICD-10-CM   1. Malignant neoplasm of upper-inner quadrant of right breast in female, estrogen receptor positive ([place]) C50.211    Z17.0     Status: outpatient  NARRATIVE: The patient was brought to the treatment unit and placed in the planned treatment position. The clinical setup was verified. Then port films were obtained and uploaded to the radiation oncology medical record software.  The treatment beams were carefully compared against the planned radiation fields. The position location and shape of the radiation fields was reviewed. They targeted volume of tissue appears to be appropriately covered by the radiation beams. Organs at risk appear to be excluded as planned.  Based on my personal review, I approved the simulation verification. The patient's treatment will proceed as planned.  -----------------------------------  Blair Promise, PhD, MD

## 2017-03-31 ENCOUNTER — Ambulatory Visit
Admission: RE | Admit: 2017-03-31 | Discharge: 2017-03-31 | Disposition: A | Discharge status: Home / Self Care | Payer: Medicare Other | Source: Ambulatory Visit | Attending: Radiation Oncology | Admitting: Radiation Oncology

## 2017-03-31 DIAGNOSIS — Z170 Estrogen receptor positive status [ER+]: Principal | ICD-10-CM

## 2017-03-31 DIAGNOSIS — C50412 Malignant neoplasm of upper-outer quadrant of left female breast: Secondary | ICD-10-CM

## 2017-03-31 DIAGNOSIS — C50211 Malignant neoplasm of upper-inner quadrant of right female breast: Secondary | ICD-10-CM | POA: Diagnosis not present

## 2017-03-31 MED ORDER — ALRA NON-METALLIC DEODORANT (RAD-ONC)
1.0000 "application " | Freq: Once | TOPICAL | Status: AC
  Administered 2017-03-31: 1 via TOPICAL

## 2017-03-31 MED ORDER — RADIAPLEXRX EX GEL
Freq: Once | CUTANEOUS | Status: AC
  Administered 2017-03-31: 15:00:00 via TOPICAL

## 2017-03-31 NOTE — Progress Notes (Signed)
Pt here for patient teaching.  Pt given Radiation and You booklet and Sonafine.  Reviewed areas of pertinence such as fatigue, skin changes and throat changes . Pt able to give teach back of to pat skin and use unscented/gentle soap,apply Sonafine bid and avoid applying anything to skin within 4 hours of treatment. Pt demonstrated understanding and verbalizes understanding of information given and will contact nursing with any questions or concerns.          

## 2017-04-01 ENCOUNTER — Ambulatory Visit
Admission: RE | Admit: 2017-04-01 | Discharge: 2017-04-01 | Disposition: A | Discharge status: Home / Self Care | Payer: Medicare Other | Source: Ambulatory Visit | Attending: Radiation Oncology | Admitting: Radiation Oncology

## 2017-04-01 DIAGNOSIS — C50211 Malignant neoplasm of upper-inner quadrant of right female breast: Secondary | ICD-10-CM | POA: Diagnosis not present

## 2017-04-02 ENCOUNTER — Ambulatory Visit
Admission: RE | Admit: 2017-04-02 | Discharge: 2017-04-02 | Disposition: A | Discharge status: Home / Self Care | Payer: Medicare Other | Source: Ambulatory Visit | Attending: Radiation Oncology | Admitting: Radiation Oncology

## 2017-04-02 DIAGNOSIS — C50211 Malignant neoplasm of upper-inner quadrant of right female breast: Secondary | ICD-10-CM | POA: Diagnosis not present

## 2017-04-03 ENCOUNTER — Ambulatory Visit
Admission: RE | Admit: 2017-04-03 | Discharge: 2017-04-03 | Disposition: A | Discharge status: Home / Self Care | Payer: Medicare Other | Source: Ambulatory Visit | Attending: Radiation Oncology | Admitting: Radiation Oncology

## 2017-04-03 DIAGNOSIS — C50211 Malignant neoplasm of upper-inner quadrant of right female breast: Secondary | ICD-10-CM | POA: Diagnosis not present

## 2017-04-06 ENCOUNTER — Ambulatory Visit
Admission: RE | Admit: 2017-04-06 | Discharge: 2017-04-06 | Disposition: A | Discharge status: Home / Self Care | Payer: Medicare Other | Source: Ambulatory Visit | Attending: Radiation Oncology | Admitting: Radiation Oncology

## 2017-04-06 DIAGNOSIS — C50211 Malignant neoplasm of upper-inner quadrant of right female breast: Secondary | ICD-10-CM | POA: Insufficient documentation

## 2017-04-06 DIAGNOSIS — Z170 Estrogen receptor positive status [ER+]: Secondary | ICD-10-CM | POA: Diagnosis not present

## 2017-04-06 DIAGNOSIS — Z510 Encounter for antineoplastic radiation therapy: Secondary | ICD-10-CM | POA: Insufficient documentation

## 2017-04-07 ENCOUNTER — Ambulatory Visit
Admission: RE | Admit: 2017-04-07 | Discharge: 2017-04-07 | Disposition: A | Discharge status: Home / Self Care | Payer: Medicare Other | Source: Ambulatory Visit | Attending: Radiation Oncology | Admitting: Radiation Oncology

## 2017-04-07 DIAGNOSIS — C50211 Malignant neoplasm of upper-inner quadrant of right female breast: Secondary | ICD-10-CM | POA: Diagnosis not present

## 2017-04-08 ENCOUNTER — Ambulatory Visit
Admission: RE | Admit: 2017-04-08 | Discharge: 2017-04-08 | Disposition: A | Discharge status: Home / Self Care | Payer: Medicare Other | Source: Ambulatory Visit | Attending: Radiation Oncology | Admitting: Radiation Oncology

## 2017-04-08 DIAGNOSIS — C50211 Malignant neoplasm of upper-inner quadrant of right female breast: Secondary | ICD-10-CM | POA: Diagnosis not present

## 2017-04-09 ENCOUNTER — Ambulatory Visit
Admission: RE | Admit: 2017-04-09 | Discharge: 2017-04-09 | Disposition: A | Discharge status: Home / Self Care | Payer: Medicare Other | Source: Ambulatory Visit | Attending: Radiation Oncology | Admitting: Radiation Oncology

## 2017-04-09 DIAGNOSIS — C50211 Malignant neoplasm of upper-inner quadrant of right female breast: Secondary | ICD-10-CM | POA: Diagnosis not present

## 2017-04-10 ENCOUNTER — Other Ambulatory Visit: Payer: Self-pay | Admitting: Hematology

## 2017-04-10 ENCOUNTER — Ambulatory Visit
Admission: RE | Admit: 2017-04-10 | Discharge: 2017-04-10 | Disposition: A | Discharge status: Home / Self Care | Payer: Medicare Other | Source: Ambulatory Visit | Attending: Radiation Oncology | Admitting: Radiation Oncology

## 2017-04-10 ENCOUNTER — Other Ambulatory Visit: Payer: Self-pay | Admitting: Oncology

## 2017-04-10 DIAGNOSIS — D759 Disease of blood and blood-forming organs, unspecified: Secondary | ICD-10-CM

## 2017-04-10 DIAGNOSIS — C50211 Malignant neoplasm of upper-inner quadrant of right female breast: Secondary | ICD-10-CM | POA: Diagnosis not present

## 2017-04-13 ENCOUNTER — Ambulatory Visit
Admission: RE | Admit: 2017-04-13 | Discharge: 2017-04-13 | Disposition: A | Discharge status: Home / Self Care | Payer: Medicare Other | Source: Ambulatory Visit | Attending: Radiation Oncology | Admitting: Radiation Oncology

## 2017-04-13 DIAGNOSIS — C50211 Malignant neoplasm of upper-inner quadrant of right female breast: Secondary | ICD-10-CM | POA: Diagnosis not present

## 2017-04-14 ENCOUNTER — Ambulatory Visit
Admission: RE | Admit: 2017-04-14 | Discharge: 2017-04-14 | Disposition: A | Discharge status: Home / Self Care | Payer: Medicare Other | Source: Ambulatory Visit | Attending: Radiation Oncology | Admitting: Radiation Oncology

## 2017-04-14 DIAGNOSIS — Z170 Estrogen receptor positive status [ER+]: Principal | ICD-10-CM

## 2017-04-14 DIAGNOSIS — C50211 Malignant neoplasm of upper-inner quadrant of right female breast: Secondary | ICD-10-CM | POA: Diagnosis not present

## 2017-04-14 MED ORDER — SONAFINE EX EMUL
1.0000 "application " | Freq: Two times a day (BID) | CUTANEOUS | Status: DC
  Administered 2017-04-14: 1 via TOPICAL

## 2017-04-15 ENCOUNTER — Ambulatory Visit
Admission: RE | Admit: 2017-04-15 | Discharge: 2017-04-15 | Disposition: A | Discharge status: Home / Self Care | Payer: Medicare Other | Source: Ambulatory Visit | Attending: Radiation Oncology | Admitting: Radiation Oncology

## 2017-04-15 DIAGNOSIS — C50211 Malignant neoplasm of upper-inner quadrant of right female breast: Secondary | ICD-10-CM | POA: Diagnosis not present

## 2017-04-16 ENCOUNTER — Ambulatory Visit
Admission: RE | Admit: 2017-04-16 | Discharge: 2017-04-16 | Disposition: A | Discharge status: Home / Self Care | Payer: Medicare Other | Source: Ambulatory Visit | Attending: Radiation Oncology | Admitting: Radiation Oncology

## 2017-04-16 DIAGNOSIS — C50211 Malignant neoplasm of upper-inner quadrant of right female breast: Secondary | ICD-10-CM | POA: Diagnosis not present

## 2017-04-17 ENCOUNTER — Ambulatory Visit
Admission: RE | Admit: 2017-04-17 | Discharge: 2017-04-17 | Disposition: A | Discharge status: Home / Self Care | Payer: Medicare Other | Source: Ambulatory Visit | Attending: Radiation Oncology | Admitting: Radiation Oncology

## 2017-04-17 DIAGNOSIS — C50211 Malignant neoplasm of upper-inner quadrant of right female breast: Secondary | ICD-10-CM | POA: Diagnosis not present

## 2017-04-20 ENCOUNTER — Ambulatory Visit
Admission: RE | Admit: 2017-04-20 | Discharge: 2017-04-20 | Disposition: A | Discharge status: Home / Self Care | Payer: Medicare Other | Source: Ambulatory Visit | Attending: Radiation Oncology | Admitting: Radiation Oncology

## 2017-04-20 DIAGNOSIS — C50211 Malignant neoplasm of upper-inner quadrant of right female breast: Secondary | ICD-10-CM | POA: Diagnosis not present

## 2017-04-21 ENCOUNTER — Ambulatory Visit
Admission: RE | Admit: 2017-04-21 | Discharge: 2017-04-21 | Disposition: A | Discharge status: Home / Self Care | Payer: Medicare Other | Source: Ambulatory Visit | Attending: Radiation Oncology | Admitting: Radiation Oncology

## 2017-04-21 DIAGNOSIS — C50211 Malignant neoplasm of upper-inner quadrant of right female breast: Secondary | ICD-10-CM | POA: Diagnosis not present

## 2017-04-22 ENCOUNTER — Ambulatory Visit
Admission: RE | Admit: 2017-04-22 | Discharge: 2017-04-22 | Disposition: A | Discharge status: Home / Self Care | Payer: Medicare Other | Source: Ambulatory Visit | Attending: Radiation Oncology | Admitting: Radiation Oncology

## 2017-04-22 DIAGNOSIS — C50211 Malignant neoplasm of upper-inner quadrant of right female breast: Secondary | ICD-10-CM | POA: Diagnosis not present

## 2017-04-23 ENCOUNTER — Ambulatory Visit
Admission: RE | Admit: 2017-04-23 | Discharge: 2017-04-23 | Disposition: A | Discharge status: Home / Self Care | Payer: Medicare Other | Source: Ambulatory Visit | Attending: Radiation Oncology | Admitting: Radiation Oncology

## 2017-04-23 DIAGNOSIS — C50211 Malignant neoplasm of upper-inner quadrant of right female breast: Secondary | ICD-10-CM | POA: Diagnosis not present

## 2017-04-24 ENCOUNTER — Ambulatory Visit
Admission: RE | Admit: 2017-04-24 | Discharge: 2017-04-24 | Disposition: A | Discharge status: Home / Self Care | Payer: Medicare Other | Source: Ambulatory Visit | Attending: Radiation Oncology | Admitting: Radiation Oncology

## 2017-04-24 DIAGNOSIS — C50211 Malignant neoplasm of upper-inner quadrant of right female breast: Secondary | ICD-10-CM | POA: Diagnosis not present

## 2017-04-27 ENCOUNTER — Ambulatory Visit
Admission: RE | Admit: 2017-04-27 | Discharge: 2017-04-27 | Disposition: A | Discharge status: Home / Self Care | Payer: Medicare Other | Source: Ambulatory Visit | Attending: Radiation Oncology | Admitting: Radiation Oncology

## 2017-04-27 DIAGNOSIS — C50211 Malignant neoplasm of upper-inner quadrant of right female breast: Secondary | ICD-10-CM | POA: Diagnosis not present

## 2017-04-28 ENCOUNTER — Ambulatory Visit: Payer: Medicare Other | Admitting: Radiation Oncology

## 2017-04-28 ENCOUNTER — Ambulatory Visit
Admission: RE | Admit: 2017-04-28 | Discharge: 2017-04-28 | Disposition: A | Discharge status: Home / Self Care | Payer: Medicare Other | Source: Ambulatory Visit | Attending: Radiation Oncology | Admitting: Radiation Oncology

## 2017-04-28 DIAGNOSIS — C50211 Malignant neoplasm of upper-inner quadrant of right female breast: Secondary | ICD-10-CM | POA: Diagnosis not present

## 2017-04-29 ENCOUNTER — Ambulatory Visit
Admission: RE | Admit: 2017-04-29 | Discharge: 2017-04-29 | Disposition: A | Discharge status: Home / Self Care | Payer: Medicare Other | Source: Ambulatory Visit | Attending: Radiation Oncology | Admitting: Radiation Oncology

## 2017-04-29 DIAGNOSIS — C50211 Malignant neoplasm of upper-inner quadrant of right female breast: Secondary | ICD-10-CM | POA: Diagnosis not present

## 2017-04-30 ENCOUNTER — Ambulatory Visit
Admission: RE | Admit: 2017-04-30 | Discharge: 2017-04-30 | Disposition: A | Discharge status: Home / Self Care | Payer: Medicare Other | Source: Ambulatory Visit | Attending: Radiation Oncology | Admitting: Radiation Oncology

## 2017-04-30 DIAGNOSIS — C50211 Malignant neoplasm of upper-inner quadrant of right female breast: Secondary | ICD-10-CM | POA: Diagnosis not present

## 2017-05-01 ENCOUNTER — Ambulatory Visit
Admission: RE | Admit: 2017-05-01 | Discharge: 2017-05-01 | Disposition: A | Discharge status: Home / Self Care | Payer: Medicare Other | Source: Ambulatory Visit | Attending: Radiation Oncology | Admitting: Radiation Oncology

## 2017-05-01 DIAGNOSIS — C50211 Malignant neoplasm of upper-inner quadrant of right female breast: Secondary | ICD-10-CM | POA: Diagnosis not present

## 2017-05-04 ENCOUNTER — Ambulatory Visit
Admission: RE | Admit: 2017-05-04 | Discharge: 2017-05-04 | Disposition: A | Discharge status: Home / Self Care | Payer: Medicare Other | Source: Ambulatory Visit | Attending: Radiation Oncology | Admitting: Radiation Oncology

## 2017-05-04 DIAGNOSIS — C50211 Malignant neoplasm of upper-inner quadrant of right female breast: Secondary | ICD-10-CM | POA: Diagnosis not present

## 2017-05-05 ENCOUNTER — Other Ambulatory Visit: Payer: Self-pay | Admitting: Oncology

## 2017-05-05 ENCOUNTER — Ambulatory Visit
Admission: RE | Admit: 2017-05-05 | Discharge: 2017-05-05 | Disposition: A | Discharge status: Home / Self Care | Payer: Medicare Other | Source: Ambulatory Visit | Attending: Radiation Oncology | Admitting: Radiation Oncology

## 2017-05-05 DIAGNOSIS — D759 Disease of blood and blood-forming organs, unspecified: Secondary | ICD-10-CM

## 2017-05-05 DIAGNOSIS — C50211 Malignant neoplasm of upper-inner quadrant of right female breast: Secondary | ICD-10-CM | POA: Diagnosis not present

## 2017-05-06 ENCOUNTER — Other Ambulatory Visit: Payer: Self-pay | Admitting: *Deleted

## 2017-05-06 ENCOUNTER — Ambulatory Visit
Admission: RE | Admit: 2017-05-06 | Discharge: 2017-05-06 | Disposition: A | Discharge status: Home / Self Care | Payer: Medicare Other | Source: Ambulatory Visit | Attending: Radiation Oncology | Admitting: Radiation Oncology

## 2017-05-06 DIAGNOSIS — Z510 Encounter for antineoplastic radiation therapy: Secondary | ICD-10-CM | POA: Diagnosis not present

## 2017-05-06 DIAGNOSIS — C50211 Malignant neoplasm of upper-inner quadrant of right female breast: Secondary | ICD-10-CM | POA: Diagnosis present

## 2017-05-06 MED ORDER — GABAPENTIN 300 MG PO CAPS: ORAL_CAPSULE | ORAL | 5 refills | Status: DC

## 2017-05-06 NOTE — Telephone Encounter (Signed)
[facility] 

## 2017-05-07 ENCOUNTER — Telehealth: Payer: Self-pay | Admitting: *Deleted

## 2017-05-07 ENCOUNTER — Ambulatory Visit
Admission: RE | Admit: 2017-05-07 | Discharge: 2017-05-07 | Disposition: A | Discharge status: Home / Self Care | Payer: Medicare Other | Source: Ambulatory Visit | Attending: Radiation Oncology | Admitting: Radiation Oncology

## 2017-05-07 DIAGNOSIS — C50211 Malignant neoplasm of upper-inner quadrant of right female breast: Secondary | ICD-10-CM | POA: Diagnosis not present

## 2017-05-07 NOTE — Telephone Encounter (Signed)
Ruth Stout with [facility] #[phone] Fax:[phone] dropped off medical clearance for patient to participate in a study being conducted at [facility]. The purpose of the study is to learn more about how focus of attention affects balance in order to reduce fall risk in older adults. Balance training program 2x per week for 12 weeks.  In order for patient to participate in study a medical clearance from provider stating that patient is cognitively able to give consent to participate and physically able to perform the tasks.  Form needs to be faxed back to research team at Fax: [phone]  Placed form in Dr. Pandey's box to review.   

## 2017-05-08 ENCOUNTER — Ambulatory Visit
Admission: RE | Admit: 2017-05-08 | Discharge: 2017-05-08 | Disposition: A | Discharge status: Home / Self Care | Payer: Medicare Other | Source: Ambulatory Visit | Attending: Radiation Oncology | Admitting: Radiation Oncology

## 2017-05-08 ENCOUNTER — Other Ambulatory Visit: Payer: Self-pay | Admitting: Hematology

## 2017-05-08 DIAGNOSIS — C50211 Malignant neoplasm of upper-inner quadrant of right female breast: Secondary | ICD-10-CM | POA: Diagnosis not present

## 2017-05-11 ENCOUNTER — Ambulatory Visit
Admission: RE | Admit: 2017-05-11 | Discharge: 2017-05-11 | Disposition: A | Discharge status: Home / Self Care | Payer: Medicare Other | Source: Ambulatory Visit | Attending: Radiation Oncology | Admitting: Radiation Oncology

## 2017-05-11 ENCOUNTER — Encounter: Payer: Self-pay | Admitting: Radiation Oncology

## 2017-05-11 DIAGNOSIS — C50211 Malignant neoplasm of upper-inner quadrant of right female breast: Secondary | ICD-10-CM | POA: Diagnosis not present

## 2017-05-11 NOTE — Progress Notes (Signed)
  Radiation Oncology         [phone] ________________________________  Name: Jill Myers MRN: [id number]  Date: 05/11/2017  DOB: 1930-02-11  End of Treatment Note  Diagnosis:   84 y.o. female with bilateral breast cancer; RIGHT upper inner quadrant invasive ductal carcinoma pT2cN0M0, stage II-A, ER+/PR+/Her2-, Grade 2;LEFT upper outer invasive ductal carcinoma pT1cN0M0, stage IA, ER+/PR+/Her2-, Grade 1     Indication for treatment:  Curative       Radiation treatment dates:   03/31/2017 - 05/11/2017  Site/dose:    1. Right Breast / 50 Gy in 25 fractions 2. Right Axilla / 45 Gy in 25 fractions 3. Right Breast Boost / 10 Gy in 5 fractions  Beams/energy:    1. 3D / 6X, 10X Photon 2. 3D / 10X, 6X Photon 3. 3D / 6X Photon  Narrative: The patient tolerated radiation treatment relatively well.  She reports continued soreness in her right breast and underarm and is taking 4 ibuprofen per day. Her skin is hyperpigmented and itchy, and she has small area of moist desquamation in the right IM fold and in the upper outer aspect of the breast.   Plan: The patient has completed radiation treatment. The patient will apply neosporin plus to areas of moist desquamation. The patient will return to radiation oncology clinic for close followup in one week. I advised them to call or return sooner if they have any questions or concerns related to their recovery or treatment.  -----------------------------------  Blair Promise, PhD, MD  This document serves as a record of services personally performed by Gery Pray, MD. It was created on his behalf by Rae Lips, a trained medical scribe. The creation of this record is based on the scribe's personal observations and the provider's statements to them. This document has been checked and approved by the attending provider.

## 2017-05-18 ENCOUNTER — Other Ambulatory Visit: Payer: Self-pay

## 2017-05-18 DIAGNOSIS — C50211 Malignant neoplasm of upper-inner quadrant of right female breast: Secondary | ICD-10-CM

## 2017-05-18 DIAGNOSIS — Z170 Estrogen receptor positive status [ER+]: Principal | ICD-10-CM

## 2017-05-18 MED ORDER — ANASTROZOLE 1 MG PO TABS: 1.0000 mg | ORAL_TABLET | Freq: Every day | ORAL | 2 refills | Status: DC

## 2017-05-19 ENCOUNTER — Encounter: Payer: Self-pay | Admitting: Radiation Oncology

## 2017-05-19 ENCOUNTER — Ambulatory Visit
Admission: RE | Admit: 2017-05-19 | Discharge: 2017-05-19 | Disposition: A | Discharge status: Home / Self Care | Payer: Medicare Other | Source: Ambulatory Visit | Attending: Radiation Oncology | Admitting: Radiation Oncology

## 2017-05-19 ENCOUNTER — Other Ambulatory Visit: Payer: Self-pay

## 2017-05-19 VITALS — BP 127/62 | HR 78 | Temp 98.3°F | Resp 20 | Ht 63.0 in | Wt 135.4 lb

## 2017-05-19 DIAGNOSIS — Z923 Personal history of irradiation: Secondary | ICD-10-CM | POA: Insufficient documentation

## 2017-05-19 DIAGNOSIS — C50412 Malignant neoplasm of upper-outer quadrant of left female breast: Secondary | ICD-10-CM | POA: Diagnosis not present

## 2017-05-19 DIAGNOSIS — Z170 Estrogen receptor positive status [ER+]: Secondary | ICD-10-CM | POA: Diagnosis not present

## 2017-05-19 DIAGNOSIS — Z7982 Long term (current) use of aspirin: Secondary | ICD-10-CM | POA: Insufficient documentation

## 2017-05-19 DIAGNOSIS — C50211 Malignant neoplasm of upper-inner quadrant of right female breast: Secondary | ICD-10-CM

## 2017-05-19 DIAGNOSIS — Z79899 Other long term (current) drug therapy: Secondary | ICD-10-CM | POA: Insufficient documentation

## 2017-05-19 NOTE — Progress Notes (Signed)
Jill Myers states that she is having soreness in her breast area. States that she is having mild fatigue. States that she is having itching and burning to her breast. States that she has some peeling to her skin. States that she has some discoloration to her breast. States that she is using sonafine and neosporin  . Vitals:   05/19/17 1114  BP: 127/62  Pulse: 78  Resp: 20  Temp: 98.3 F (36.8 C)  TempSrc: Oral  SpO2: 95%  Weight: 135 lb 6.4 oz (61.4 kg)  Height: 5\' 3"  (1.6 m)   Wt Readings from Last 3 Encounters:  05/19/17 135 lb 6.4 oz (61.4 kg)  03/12/17 134 lb 6.4 oz (61 kg)  03/06/17 135 lb 8 oz (61.5 kg)

## 2017-05-19 NOTE — Progress Notes (Signed)
.  kht

## 2017-05-19 NOTE — Progress Notes (Signed)
Radiation Oncology         [phone] ________________________________  Name: Jill Myers MRN: [id number]  Date: 05/19/2017  DOB: 07/18/1930  Follow-Up Visit Note  CC: [name], MD  Jovita Kussmaul, MD    ICD-10-CM   1. Malignant neoplasm of upper-outer quadrant of left breast in female, estrogen receptor positive ([place]) C50.412    Z17.0   2. Malignant neoplasm of upper-inner quadrant of right breast in female, estrogen receptor positive ([place]) C50.211    Z17.0      Diagnosis:   84 y.o. female with bilateral breast cancer; RIGHT upper inner quadrant invasive ductal carcinoma pT2cN0M0, stage II-A, ER+/PR+/Her2-, Grade 2;LEFT upper outer invasive ductal carcinoma pT1cN0M0, stage IA, ER+/PR+/Her2-, Grade 1     Indication for treatment:  Curative       Radiation treatment dates:   03/31/2017 - 05/11/2017  Site/dose:    1. Right Breast / 50 Gy in 25 fractions 2. Right Axilla / 45 Gy in 25 fractions 3. Right Breast Boost / 10 Gy in 5 fractions    Interval Since Last Radiation:  8 days  Narrative:  The patient returns today for close follow-up. The patient had developed some moist desquamation at the completion of her treatment and is being followed closely until this heals.   Patient seems to have some fatigue. She has noticed some itching/burning sensations within the right breast region she continues to use sonofine and Neosporin                        ALLERGIES:  is allergic to doxycycline; macrodantin [nitrofurantoin]; and sulfamethoxazole-trimethoprim.  Meds: Current Outpatient Medications  Medication Sig Dispense Refill  . anastrozole (ARIMIDEX) 1 MG tablet Take 1 tablet (1 mg total) by mouth daily. 30 tablet 2  . aspirin 81 MG tablet Take 81 mg by mouth daily.      . calcium carbonate (OSCAL) 1500 (600 Ca) MG TABS tablet Take 600 mg of elemental calcium by mouth daily.    . cholecalciferol (VITAMIN D) 1000 UNITS tablet Take 1,000 Units by mouth daily.    [name]  docusate sodium (COLACE) 250 MG capsule Take 250 mg by mouth daily.    [name] gabapentin (NEURONTIN) 300 MG capsule Take one capsule by mouth every morning. Take two capsules by mouth at noon. Take three capsules by mouth in the evening. 180 capsule 5  . Glucosamine-Chondroitin 750-600 MG TABS Take 1 tablet by mouth. Take one tablet daily    . hydrochlorothiazide (HYDRODIURIL) 25 MG tablet TAKE 1 TABLET BY MOUTH EVERY DAY 90 tablet 1  . hydroxyurea (HYDREA) 500 MG capsule TAKE 2 CAPSULES (1,000 MG TOTAL) BY MOUTH DAILY. MAY TAKE WITH FOOD TO MINIMIZE GI SIDE EFFECTS. 60 capsule 2  . ibuprofen (ADVIL,MOTRIN) 200 MG tablet Take 200 mg by mouth as needed.    . Omega-3 Fatty Acids (FISH OIL PO) Take by mouth.    [name] omeprazole (PRILOSEC) 20 MG capsule Take 20 mg by mouth daily.    . polyethylene glycol (MIRALAX / GLYCOLAX) packet Take 17 g by mouth daily.      [name] pyridOXINE (VITAMIN B-6) 100 MG tablet Take 100 mg by mouth daily.    . solifenacin (VESICARE) 10 MG tablet Take 10 mg by mouth daily.     No current facility-administered medications for this encounter.     Physical Findings: The patient is in no acute distress. Patient is alert and oriented.  height is  5' 3"  (1.6 m) and weight is 135 lb 6.4 oz (61.4 kg). Her oral temperature is 98.3 F (36.8 C). Her blood pressure is 127/62 and her pulse is 78. Her respiration is 20 and oxygen saturation is 95%. .  Lungs are clear to auscultation bilaterally. Heart has regular rate and rhythm. No palpable cervical, supraclavicular, or axillary adenopathy. Abdomen soft, non-tender, normal bowel sounds.the right breast area shows some continued mild moist desquamation in the inframammary fold and low axillary region.  Lab Findings: Lab Results  Component Value Date   WBC 3.1 (L) 01/22/2017   HGB 12.4 01/22/2017   HCT 34.2 (L) 01/22/2017   MCV 111.0 (H) 01/22/2017   PLT 447 (H) 01/22/2017    Radiographic Findings: No results found.  Impression:  The  patient is recovering from the effects of radiation.  She will continue using Neosporin in the areas of skin breakdown and sonafine elsewhere  Plan:  Follow-up in 2 weeks  ____________________________________ Gery Pray, MD

## 2017-05-22 ENCOUNTER — Telehealth: Payer: Self-pay | Admitting: *Deleted

## 2017-05-22 NOTE — Telephone Encounter (Signed)
This should not affect her Hydrea

## 2017-05-22 NOTE — Telephone Encounter (Signed)
Lab results from [facility] done 05/19/17  - viewed by Dr Beryle Beams. Hgb 12.1, WBC 2.8 Plt 351, Neutrophils 65.9. "Continue current dose of Hydrea" per Dr Darnell Level - pt called/ informed. Stated she just finished radiation / has skin ulcers.

## 2017-06-02 ENCOUNTER — Ambulatory Visit: Payer: Medicare Other | Admitting: Oncology

## 2017-06-02 ENCOUNTER — Encounter: Payer: Self-pay | Admitting: Oncology

## 2017-06-02 ENCOUNTER — Other Ambulatory Visit: Payer: Self-pay

## 2017-06-02 ENCOUNTER — Ambulatory Visit
Admission: RE | Admit: 2017-06-02 | Discharge: 2017-06-02 | Disposition: A | Discharge status: Home / Self Care | Payer: Medicare Other | Source: Ambulatory Visit | Attending: Radiation Oncology | Admitting: Radiation Oncology

## 2017-06-02 ENCOUNTER — Encounter: Payer: Self-pay | Admitting: Radiation Oncology

## 2017-06-02 VITALS — BP 143/73 | HR 70 | Temp 98.1°F | Ht 63.0 in | Wt 134.9 lb

## 2017-06-02 VITALS — BP 128/64 | HR 79 | Temp 98.6°F | Resp 20 | Wt 135.1 lb

## 2017-06-02 DIAGNOSIS — C50211 Malignant neoplasm of upper-inner quadrant of right female breast: Secondary | ICD-10-CM

## 2017-06-02 DIAGNOSIS — Z79899 Other long term (current) drug therapy: Secondary | ICD-10-CM | POA: Insufficient documentation

## 2017-06-02 DIAGNOSIS — Z7982 Long term (current) use of aspirin: Secondary | ICD-10-CM | POA: Diagnosis not present

## 2017-06-02 DIAGNOSIS — C50412 Malignant neoplasm of upper-outer quadrant of left female breast: Secondary | ICD-10-CM

## 2017-06-02 DIAGNOSIS — Z170 Estrogen receptor positive status [ER+]: Secondary | ICD-10-CM

## 2017-06-02 DIAGNOSIS — Z923 Personal history of irradiation: Secondary | ICD-10-CM | POA: Insufficient documentation

## 2017-06-02 DIAGNOSIS — D473 Essential (hemorrhagic) thrombocythemia: Secondary | ICD-10-CM

## 2017-06-02 DIAGNOSIS — Z888 Allergy status to other drugs, medicaments and biological substances status: Secondary | ICD-10-CM

## 2017-06-02 NOTE — Progress Notes (Signed)
Radiation Oncology         [phone] ________________________________  Name: Jill Myers MRN: [id number]  Date: 06/02/2017  DOB: 1930-06-07  Follow-Up Visit Note  CC: [name], MD  Jovita Kussmaul, MD    ICD-10-CM   1. Malignant neoplasm of upper-inner quadrant of right breast in female, estrogen receptor positive ([place]) C50.211    Z17.0     Diagnosis:  84 y.o. female with bilateral breast cancer; RIGHT upper inner quadrant invasive ductal carcinoma pT2cN0M0, stage II-A, ER+/PR+/Her2-, Grade 2;LEFT upper outer invasive ductal carcinoma pT1cN0M0, stage IA, ER+/PR+/Her2-, Grade 1        Interval Since Last Radiation:  3 weeks  Narrative:  The patient returns today for close follow-up. The patient developed moist desquamation at the completion of her treatment along the intramammary fold and upper outer aspect of the breast area. She continues to have pain in the breast area requiring approximately 4 ibuprofen per day. She denies any bleeding from the breast nipple discharge or drainage. She is using nonadherent dressings                           ALLERGIES:  is allergic to doxycycline; macrodantin [nitrofurantoin]; and sulfamethoxazole-trimethoprim.  Meds: Current Outpatient Medications  Medication Sig Dispense Refill  . anastrozole (ARIMIDEX) 1 MG tablet Take 1 tablet (1 mg total) by mouth daily. 30 tablet 2  . aspirin 81 MG tablet Take 81 mg by mouth daily.      . calcium carbonate (OSCAL) 1500 (600 Ca) MG TABS tablet Take 600 mg of elemental calcium by mouth daily.    . cholecalciferol (VITAMIN D) 1000 UNITS tablet Take 1,000 Units by mouth daily.    [name] docusate sodium (COLACE) 250 MG capsule Take 250 mg by mouth daily.    [name] gabapentin (NEURONTIN) 300 MG capsule Take one capsule by mouth every morning. Take two capsules by mouth at noon. Take three capsules by mouth in the evening. 180 capsule 5  . Glucosamine-Chondroitin 750-600 MG TABS Take 1 tablet by mouth. Take one  tablet daily    . hydrochlorothiazide (HYDRODIURIL) 25 MG tablet TAKE 1 TABLET BY MOUTH EVERY DAY 90 tablet 1  . hydroxyurea (HYDREA) 500 MG capsule TAKE 2 CAPSULES (1,000 MG TOTAL) BY MOUTH DAILY. MAY TAKE WITH FOOD TO MINIMIZE GI SIDE EFFECTS. 60 capsule 2  . ibuprofen (ADVIL,MOTRIN) 200 MG tablet Take 200 mg by mouth as needed.    . Omega-3 Fatty Acids (FISH OIL PO) Take by mouth.    [name] omeprazole (PRILOSEC) 20 MG capsule Take 20 mg by mouth daily.    . polyethylene glycol (MIRALAX / GLYCOLAX) packet Take 17 g by mouth daily.      [name] pyridOXINE (VITAMIN B-6) 100 MG tablet Take 100 mg by mouth daily.    . solifenacin (VESICARE) 10 MG tablet Take 10 mg by mouth daily.     No current facility-administered medications for this encounter.     Physical Findings: The patient is in no acute distress. Patient is alert and oriented.  weight is 135 lb 2 oz (61.3 kg). Her oral temperature is 98.6 F (37 C). Her blood pressure is 128/64 and her pulse is 79. Her respiration is 20 and oxygen saturation is 95%. . Lungs are clear to auscultation bilaterally. Heart has regular rate and rhythm. No palpable cervical, supraclavicular, or axillary adenopathy. Abdomen soft, non-tender, normal bowel sounds.  The right breast area shows hyperpigmentation  changes.No signs of infection. The patient has slight area of skin breakdown in the inframammary fold and upper outer quadrant no active moist desquamation  Lab Findings: Lab Results  Component Value Date   WBC 3.1 (L) 01/22/2017   HGB 12.4 01/22/2017   HCT 34.2 (L) 01/22/2017   MCV 111.0 (H) 01/22/2017   PLT 447 (H) 01/22/2017    Radiographic Findings: No results found.  Impression:  The patient is recovering from the effects of radiation.  Patient has healed significantly since her last follow-up but not completely at this point. She will continue using her RadiaPlex and Neosporin. The patient is allergic to sulfa medications so Silvadene has not been used  in  her management.  Plan:  routine follow-up in one month.  ____________________________________ Gery Pray, MD

## 2017-06-02 NOTE — Patient Instructions (Addendum)
CBC at [facility] on 6/18, 8/20/ 10/22, 12/29/17 MD vis t lab same day 09/22/17

## 2017-06-02 NOTE — Progress Notes (Signed)
Ms. Traynham is here for a follow-up appointment. States that she is  having pain under her right breast and near her underarm.States that she is having some burning in her breast area. States that her skin is hyperpigmented. States that she had some peeling to her skin ,but that it is no longer peeling on the breast ,but states that she has some peeling to the nipple area. States that she is using radiaplex and neosporin on her skin.States that her breat are tender.Denies any fatigue. Wt Readings from Last 3 Encounters:  06/02/17 135 lb 2 oz (61.3 kg)  06/02/17 134 lb 14.4 oz (61.2 kg)  05/19/17 135 lb 6.4 oz (61.4 kg)   Wt Readings from Last 3 Encounters:  06/02/17 135 lb 2 oz (61.3 kg)  06/02/17 134 lb 14.4 oz (61.2 kg)  05/19/17 135 lb 6.4 oz (61.4 kg)

## 2017-06-02 NOTE — Progress Notes (Signed)
Hematology and Oncology Follow Up Visit  Jill Myers [id number] 1930-06-21 84 y.o. 06/02/2017 11:24 AM   Principle Diagnosis: Encounter Diagnoses  Name Primary?  . Essential thrombocythemia ([place]) Yes  . Malignant neoplasm of upper-outer quadrant of left breast in female, estrogen receptor positive [facility])   Clinical Summary: Pleasant 84 year old woman diagnosed with essential thrombocythemia in April 2005. Blood counts are well controlled on low-dose Hydrea 1000 mg daily and aspirin 81 mg daily. She has never had any thrombotic events. Hydrea controls her counts at the expense of a mild chronic leukopenia and anemia.  Interim History: A lot has happened since her last visit with me.  She felt a lump in her right breast.  She had not had a mammogram in about 5 years since January 2013.  Mammogram done on January 01, 2017 showed bilateral breast masses: A 2 cm mass 2 o'clock position right breast and a 0.7 cm mass 2 o'clock position in the left breast.  She underwent ultrasound guided biopsies of both of these masses and they were both found to be invasive ductal cell carcinoma, ER PR positive, HER-2 negative.  She underwent bilateral lumpectomies on February 20, 2017.  No sentinel lymph node dissection.  Left tumor 1.1 cm.  Right tumor 2.1 cm.  Additional areas of atypical lobular hyperplasia.  I do not believe that Oncotype DX testing was done in view of her age.  She received radiation to the right breast completed on May 6.  She started on hormonal therapy with Arimidex.  With respect to her myeloproliferative disorder, she has been stable on 1000 mg of Hydrea daily for many years.  However, the last 2 CBCs have shown increasing suppression of her total white count.  On May 14 total white count 2800 but still 66% neutrophils.  Hemoglobin stable at 12.  Lightless 351,000. She denies any new neurologic symptoms and specifically no headaches, slurred speech, focal weakness, or  paresthesias.  Medications: reviewed  Allergies:  Allergies  Allergen Reactions  . Doxycycline Nausea And Vomiting  . Macrodantin [Nitrofurantoin] Nausea And Vomiting  . Sulfamethoxazole-Trimethoprim Rash    REACTION: Rash after completing course of Septra    Review of Systems: See interim history  Remaining ROS negative:   Physical Exam: Blood pressure (!) 143/73, pulse 70, temperature 98.1 F (36.7 C), temperature source Oral, height 5' 3"  (1.6 m), weight 134 lb 14.4 oz (61.2 kg), SpO2 96 %. Wt Readings from Last 3 Encounters:  06/02/17 134 lb 14.4 oz (61.2 kg)  05/19/17 135 lb 6.4 oz (61.4 kg)  03/12/17 134 lb 6.4 oz (61 kg)     General appearance: well nourished Caucasian woman HENNT: Pharynx no erythema, exudate, mass, or ulcer. No thyromegaly or thyroid nodules Lymph nodes: No cervical, supraclavicular, or axillary lymphadenopathy Breasts: Radiation skin changes right breast. Lungs: Clear to auscultation, resonant to percussion throughout Heart: Regular rhythm, no murmur, no gallop, no rub, no click, no edema Abdomen: Soft, nontender, normal bowel sounds, no mass, no organomegaly Extremities: No edema, no calf tenderness Musculoskeletal: no joint deformities GU:  Vascular: Carotid pulses 2+, no bruits, Neurologic: Alert, oriented, PERRLA,  cranial nerves grossly normal, motor strength 5 over 5, reflexes 1+ symmetric, upper body coordination normal, gait normal, Skin: No rash or ecchymosis  Lab Results: CBC W/Diff    Component Value Date/Time   WBC 3.1 (L) 01/22/2017 0715   RBC 3.08 (L) 01/22/2017 0715   HGB 12.4 01/22/2017 0715   HGB 11.7 01/21/2017 1225  HGB 11.7 12/18/2014 1342   HCT 34.2 (L) 01/22/2017 0715   HCT 34.7 (L) 12/18/2014 1342   PLT 447 (H) 01/22/2017 0715   PLT 437 (H) 01/21/2017 1225   PLT 340 12/18/2014 1342   MCV 111.0 (H) 01/22/2017 0715   MCV 120.9 (H) 12/18/2014 1342   MCH 40.3 (H) 01/22/2017 0715   MCHC 36.3 (H) 01/22/2017 0715    RDW 13.7 01/22/2017 0715   RDW 13.8 12/18/2014 1342   LYMPHSABS 0.8 (L) 01/21/2017 1225   LYMPHSABS 1.3 12/18/2014 1342   MONOABS 0.7 01/21/2017 1225   MONOABS 0.4 12/18/2014 1342   EOSABS 0.1 01/21/2017 1225   EOSABS 0.0 12/18/2014 1342   BASOSABS 0.0 01/21/2017 1225   BASOSABS 0.0 12/18/2014 1342     Chemistry      Component Value Date/Time   NA 135 01/22/2017 0715   NA 138 09/04/2015   NA 138 10/16/2014 1404   K 3.8 01/22/2017 0715   K 3.4 (L) 10/16/2014 1404   CL 100 01/22/2017 0715   CL 100 02/13/2012 1344   CO2 30 01/22/2017 0715   CO2 28 10/16/2014 1404   BUN 12 01/22/2017 0715   BUN 14 09/04/2015   BUN 15.2 10/16/2014 1404   CREATININE 0.74 01/22/2017 0715   CREATININE 0.9 10/16/2014 1404   GLU 86 09/04/2015      Component Value Date/Time   CALCIUM 9.0 01/22/2017 0715   CALCIUM 9.0 10/16/2014 1404   ALKPHOS 48 01/21/2017 1224   ALKPHOS 48 10/16/2014 1404   AST 22 01/22/2017 0715   AST 22 01/22/2017 0715   AST 22 01/21/2017 1224   AST 27 10/16/2014 1404   ALT 25 01/22/2017 0715   ALT 25 01/22/2017 0715   ALT 26 01/21/2017 1224   ALT 34 10/16/2014 1404   BILITOT 0.6 01/22/2017 0715   BILITOT 0.6 01/22/2017 0715   BILITOT 0.5 01/21/2017 1224   BILITOT 0.44 10/16/2014 1404       Radiological Studies: No results found.  Impression:  1.  Myeloproliferative disorder: Essential thrombocythemia Well-controlled on Hydrea.  I have been checking blood counts every other month.  In view of the trend for progressive leukopenia, I will repeat a CBC again in 1 month.  If her total white count is consistently less than 3000, I will make a slight dose reduction.  No current evidence for transformation to a more aggressive blood disorder.  2.  Bilateral invasive hormone positive HER-2 negative breast cancers stage I left breast treated with lumpectomy and hormonal therapy, stage II 2.1 cm, Nx, cancer of the right breast treated with lumpectomy radiation and hormonal  therapy.  She saw Dr. Truitt Merle for medical oncology.  Dr. Burr Medico told her she would be happy to take over her total heme-onc care when I retire next spring.  CC: Patient Care Team: [name], MD as PCP - General (Internal Medicine) Annia Belt, MD as Consulting Physician (Oncology) [name], MD as Consulting Physician (Dermatology) [name], MD as Consulting Physician (Sports Medicine) [name], MD (Inactive) as Consulting Physician (Family Medicine) Jovita Kussmaul, MD as Consulting Physician (General Surgery) Raye Sorrow, MD as Consulting Physician (Pulmonary Disease) Truitt Merle, MD as Consulting Physician (Hematology) Gery Pray, MD as Consulting Physician (Radiation Oncology)   Murriel Hopper, MD, [place]  Hematology-Oncology/Internal Medicine     5/28/201911:24 AM

## 2017-06-09 ENCOUNTER — Inpatient Hospital Stay (HOSPITAL_BASED_OUTPATIENT_CLINIC_OR_DEPARTMENT_OTHER): Payer: Medicare Other | Admitting: Nurse Practitioner

## 2017-06-09 ENCOUNTER — Telehealth: Payer: Self-pay

## 2017-06-09 ENCOUNTER — Inpatient Hospital Stay: Payer: Medicare Other | Attending: Hematology

## 2017-06-09 ENCOUNTER — Encounter: Payer: Self-pay | Admitting: Nurse Practitioner

## 2017-06-09 VITALS — BP 121/71 | HR 76 | Temp 98.2°F | Resp 18 | Ht 63.0 in | Wt 134.6 lb

## 2017-06-09 DIAGNOSIS — Z86718 Personal history of other venous thrombosis and embolism: Secondary | ICD-10-CM | POA: Diagnosis not present

## 2017-06-09 DIAGNOSIS — M8580 Other specified disorders of bone density and structure, unspecified site: Secondary | ICD-10-CM | POA: Diagnosis not present

## 2017-06-09 DIAGNOSIS — Z923 Personal history of irradiation: Secondary | ICD-10-CM | POA: Diagnosis not present

## 2017-06-09 DIAGNOSIS — D473 Essential (hemorrhagic) thrombocythemia: Secondary | ICD-10-CM | POA: Insufficient documentation

## 2017-06-09 DIAGNOSIS — K5900 Constipation, unspecified: Secondary | ICD-10-CM | POA: Insufficient documentation

## 2017-06-09 DIAGNOSIS — I10 Essential (primary) hypertension: Secondary | ICD-10-CM | POA: Diagnosis not present

## 2017-06-09 DIAGNOSIS — Z170 Estrogen receptor positive status [ER+]: Secondary | ICD-10-CM

## 2017-06-09 DIAGNOSIS — C50211 Malignant neoplasm of upper-inner quadrant of right female breast: Secondary | ICD-10-CM | POA: Insufficient documentation

## 2017-06-09 DIAGNOSIS — Z79811 Long term (current) use of aromatase inhibitors: Secondary | ICD-10-CM | POA: Diagnosis not present

## 2017-06-09 DIAGNOSIS — T300 Burn of unspecified body region, unspecified degree: Secondary | ICD-10-CM

## 2017-06-09 DIAGNOSIS — R5383 Other fatigue: Secondary | ICD-10-CM | POA: Insufficient documentation

## 2017-06-09 DIAGNOSIS — K219 Gastro-esophageal reflux disease without esophagitis: Secondary | ICD-10-CM

## 2017-06-09 DIAGNOSIS — Z7982 Long term (current) use of aspirin: Secondary | ICD-10-CM | POA: Insufficient documentation

## 2017-06-09 DIAGNOSIS — C50412 Malignant neoplasm of upper-outer quadrant of left female breast: Secondary | ICD-10-CM

## 2017-06-09 DIAGNOSIS — G2581 Restless legs syndrome: Secondary | ICD-10-CM | POA: Insufficient documentation

## 2017-06-09 DIAGNOSIS — Z79899 Other long term (current) drug therapy: Secondary | ICD-10-CM | POA: Diagnosis not present

## 2017-06-09 DIAGNOSIS — D72819 Decreased white blood cell count, unspecified: Secondary | ICD-10-CM | POA: Diagnosis not present

## 2017-06-09 LAB — COMPREHENSIVE METABOLIC PANEL
ALBUMIN: 4 g/dL (ref 3.5–5.0)
ALT: 31 U/L (ref 0–55)
AST: 25 U/L (ref 5–34)
Alkaline Phosphatase: 52 U/L (ref 40–150)
Anion gap: 8 (ref 3–11)
BUN: 15 mg/dL (ref 7–26)
CHLORIDE: 95 mmol/L — AB (ref 98–109)
CO2: 29 mmol/L (ref 22–29)
Calcium: 8.9 mg/dL (ref 8.4–10.4)
Creatinine, Ser: 0.86 mg/dL (ref 0.60–1.10)
GFR calc Af Amer: >60 mL/min (ref >60)
GFR calc non Af Amer: >60 mL/min (ref >60)
GLUCOSE: 95 mg/dL (ref 70–140)
POTASSIUM: 3.5 mmol/L (ref 3.5–5.1)
Sodium: 132 mmol/L — ABNORMAL LOW (ref 136–145)
Total Bilirubin: 0.4 mg/dL (ref 0.2–1.2)
Total Protein: 6.5 g/dL (ref 6.4–8.3)

## 2017-06-09 LAB — CBC WITH DIFFERENTIAL/PLATELET
Band Neutrophils: 0 %
Basophils Absolute: 0 10*3/uL (ref 0.0–0.1)
Basophils Relative: 0 %
Blasts: 0 %
EOS ABS: 0 10*3/uL (ref 0.0–0.5)
Eosinophils Relative: 2 %
HEMATOCRIT: 32.5 % — AB (ref 34.8–46.6)
Hemoglobin: 11.1 g/dL — ABNORMAL LOW (ref 11.6–15.9)
LYMPHS PCT: 12 %
Lymphs Abs: 0.3 10*3/uL — ABNORMAL LOW (ref 0.9–3.3)
MCH: 40.7 pg — ABNORMAL HIGH (ref 25.1–34.0)
MCHC: 34.1 g/dL (ref 31.5–36.0)
MCV: 119.4 fL — ABNORMAL HIGH (ref 79.5–101.0)
MONO ABS: 0.5 10*3/uL (ref 0.1–0.9)
Metamyelocytes Relative: 0 %
Monocytes Relative: 20 %
Myelocytes: 0 %
NEUTROS ABS: 1.5 10*3/uL (ref 1.5–6.5)
Neutrophils Relative %: 66 %
OTHER: 0 %
PLATELETS: 310 10*3/uL (ref 145–400)
PROMYELOCYTES RELATIVE: 0 %
RBC: 2.72 MIL/uL — ABNORMAL LOW (ref 3.70–5.45)
RDW: 14.7 % — AB (ref 11.2–14.5)
WBC: 2.3 10*3/uL — ABNORMAL LOW (ref 3.9–10.3)
nRBC: 0 /100 WBC

## 2017-06-09 NOTE — Addendum Note (Signed)
Addended by: Stephenie Acres on: 06/09/2017 08:35 AM   Modules accepted: Orders

## 2017-06-09 NOTE — Telephone Encounter (Signed)
Printed avs and calender of upcoming appointment. Per 6/4 los 

## 2017-06-09 NOTE — Progress Notes (Signed)
[facility]  [phone] Fax:([phone]) [phone]  Clinic Follow up Note   Patient Care Team: [name], MD as PCP - General (Internal Medicine) Annia Belt, MD as Consulting Physician (Oncology) [name], MD as Consulting Physician (Dermatology) [name], MD as Consulting Physician (Sports Medicine) [name], MD (Inactive) as Consulting Physician (Family Medicine) Jovita Kussmaul, MD as Consulting Physician (General Surgery) Raye Sorrow, MD as Consulting Physician (Pulmonary Disease) Truitt Merle, MD as Consulting Physician (Hematology) Gery Pray, MD as Consulting Physician (Radiation Oncology) 06/09/2017  SUMMARY OF ONCOLOGIC HISTORY: Oncology History   Cancer Staging Malignant neoplasm of upper-inner quadrant of right breast in female, estrogen receptor positive ([place]) Staging form: Breast, AJCC 8th Edition - Clinical stage from 01/07/2017: Stage IA (cT1c, cN0, cM0, G3, ER: Positive, PR: Positive, HER2: Negative) - Signed by Truitt Merle, MD on 01/20/2017  Malignant neoplasm of upper-outer quadrant of left breast in female, estrogen receptor positive ([place]) Staging form: Breast, AJCC 8th Edition - Clinical stage from 01/07/2017: Stage IA (cT1b, cN0, cM0, G2, ER: Positive, PR: Positive, HER2: Negative) - Signed by Truitt Merle, MD on 01/20/2017       Malignant neoplasm of upper-inner quadrant of right breast in female, estrogen receptor positive ([place])   01/01/2017 Mammogram    IMPRESSION: 1. 2.0 cm mass in the 2 o'clock position of the right breast with imaging features highly suspicious for malignancy. 2. 0.7 cm mass in the 2 o'clock position of the left breast with imaging features highly suspicious for malignancy. 3. No abnormal appearing axillary lymph nodes on either side.      01/07/2017 Receptors her2    Right Breast: Prognostic indicators significant for: ER, 95% positive and PR, 50% positive, both with strong staining  intensity. Proliferation marker Ki67 at 30. HER2 negative. RATIO OF HER2/CEP17 SIGNALS 0.81 AVERAGE HER2 COPY NUMBER PER CELL 1.70  Left Breast: Prognostic indicators significant for: ER, 100% positive and PR, 100% positive, both with strong staining intensity. Proliferation marker Ki67 at 10. HER2 negative. RATIO OF HER2/CEP17 SIGNALS 1.75 AVERAGE HER2 COPY NUMBER PER CELL 3.15      01/07/2017 Initial Biopsy    Diagnosis 1. Breast, right, needle core biopsy, 2:00 o'clock - INVASIVE DUCTAL CARCINOMA, SEE COMMENT. 2. Breast, left, needle core biopsy, 2:00 o'clock - INVASIVE MAMMARY CARCINOMA, SEE COMMENT.       01/20/2017 Initial Diagnosis    Malignant neoplasm of upper-inner quadrant of right breast in female, estrogen receptor positive ([place])      02/20/2017 Surgery    RIGHT BREAST LUMPECTOMY (Right) LEFT BREAST LUMPECTOMY WITH RADIOACTIVE SEED LOCALIZATION (Left)  Per Dr. Marlou Starks       02/20/2017 Pathology Results     Diagnosis 1. Breast, lumpectomy, Left w/ seed - INVASIVE DUCTAL CARCINOMA, GRADE I/III, SPANNING 1.1 CM. - THE SURGICAL RESECTION MARGINS ARE NEGATIVE FOR CARCINOMA. - SEE ONCOLOGY TABLE BELOW. 2. Breast, lumpectomy, Right - INVASIVE DUCTAL CARCINOMA, GRADE II/III, SPANNING 2.1 CM. - DUCTAL CARCINOMA IN SITU, INTERMEDIATE GRADE. - PERINEURAL INVASION IS IDENTIFIED. - THE SURGICAL RESECTION MARGINS ARE NEGATIVE FOR CARCINOMA. - SEE ONCOLOGY TABLE BELOW. 3. Breast, excision, Right inferior - LOBULAR NEOPLASIA (ATYPICAL LOBULAR HYPERPLASIA). - FAT NECROSIS. - SEE COMMENT.  Microscopic Comment 1. BREAST, INVASIVE TUMOR (LEFT) Procedure: Seed localized lumpectomy Laterality: Left Tumor Size: 1.1 cm (gross measurement). Histologic Type: Ductal Grade: I Tubular Differentiation: 2 Nuclear Pleomorphism: 1 Mitotic Count: 1 Ductal Carcinoma in Situ (DCIS): Not identified Extent of Tumor: Confined to breast parenchyma.  Margins: Greater than 0.2 cm to all  margins. Regional Lymph Nodes: None examined. Breast Prognostic Profile: Case [id number] Estrogen Receptor: 100%, strong Progesterone Receptor: 100%, strong Her2: No amplification was detected. The ratio was 0.81 Ki-67: 10% Best tumor block for sendout testing: 1B Pathologic Stage Classification (pTNM, AJCC 8th Edition): Primary Tumor (pT): pT1c Regional Lymph Nodes (pN): pNX Distant Metastases (pM): pMX  2. BREAST, INVASIVE TUMOR (RIGHT) Procedure: Lumpectomy Laterality: Right Tumor Size: 2.1 cm (gross measurement) Histologic Type: Ductal Grade: II Tubular Differentiation: 3 Nuclear Pleomorphism: 2 Mitotic Count: 1 Ductal Carcinoma in Situ (DCIS): Present, intermediate grade Extent of Tumor: Confined to breast parenchyma. Margins: Greater than 0.2 cm to all margins Regional Lymph Nodes: None examined. Breast Prognostic Profile: Case [id number] Estrogen Receptor: 95%, strong Progesterone Receptor: 50%, strong Her2: No amplification was detected. The ratio was 0.81 Ki-67: 30% Best tumor block for sendout testing: 2A Pathologic Stage Classification (pTNM, AJCC 8th Edition): Primary Tumor (pT): pT2 Regional Lymph Nodes (pN): pNX Distant Metastases (pM): pMX 3. The surgical resection margin(s) of the specimen were inked and microscopically evaluated.       Malignant neoplasm of upper-outer quadrant of left breast in female, estrogen receptor positive ([place])   01/20/2017 Initial Diagnosis    Malignant neoplasm of upper-outer quadrant of left breast in female, estrogen receptor positive ([place])      02/20/2017 Surgery    RIGHT BREAST LUMPECTOMY (Right) LEFT BREAST LUMPECTOMY WITH RADIOACTIVE SEED LOCALIZATION (Left)  Per Dr. Marlou Starks       CURRENT THERAPY: Anastrozole 1 mg daily; s/p bil lumpectomy and radiation 3/26/190-05/11/17 per Dr. Sondra Come   Diagnosis: essential thrombocytopenia, managed by Dr. Murriel Hopper Current treatment: Hydrea 1000 mg, aspirin daily  for thrombus prophylasix  INTERVAL HISTORY: Ms. Berne returns for follow up as scheduled. She completed radiation 1 month ago, she developed moist desquamation to right breast fold and is still recovering. She has occasional severe pain in the area when her bra rubs against open wound. Takes 2 tabs ibuprofen 4 times daily. Applies radiaplex and neosporin and occasionally baby powder to the site. She continues anastrozole daily, tolerating well. Energy level improved after radiation. Has good appetite. Manages chronic constipation with miralax PRN. No fever, chills, cough, chest pain, dyspnea.   REVIEW OF SYSTEMS:   Constitutional: Denies fevers, chills or abnormal weight loss (+) fatigue improved after radiation  Eyes: Denies blurriness of vision Ears, nose, mouth, throat, and face: Denies mucositis or sore throat Respiratory: Denies cough, dyspnea or wheezes Cardiovascular: Denies palpitation, chest discomfort or lower extremity swelling Gastrointestinal:  Denies nausea, vomiting, diarrhea, heartburn, hematochezia, or change in bowel habits (+) chronic constipation managed with miralax  Skin: Denies abnormal skin rashes (+) hyperpigmentation and breast wound related to radiation   Lymphatics: Denies new lymphadenopathy or easy bruising Neurological:Denies numbness, tingling or new weaknesses (+) restless leg, managed with gabapentin  Behavioral/Psych: Mood is stable, no new changes  All other systems were reviewed with the patient and are negative.  MEDICAL HISTORY:  Past Medical History:  Diagnosis Date  . Bladder cystocele 05/21/2010  . Complication of anesthesia    deglutition in history  . Deglutition disorder 08/17/2012   Single episode of choking/aspiration; preceded by trouble swallowing saliva (thin liquids).  For SLP evaluation. Aug 17, 2012.    [name] Disease of blood and blood forming organ 01/05/2007   Qualifier: Diagnosis of  By: Oneida Alar MD, KARL    . Diverticulitis   .  DIVERTICULOSIS OF COLON 03/05/2006  Qualifier: Diagnosis of  By: Samara Snide    . DIZZINESS 01/18/2010   Qualifier: Diagnosis of  By: Lindell Noe MD, Jeneen Rinks    . Essential hypertension, benign 02/22/2008   Qualifier: Diagnosis of  By: Lindell Noe MD, Jeneen Rinks     . Essential thrombocythemia ([place]) 01/31/11  . Family history of Alzheimer's disease 11/11/2013   Patient with family history of Alzheimers disease. She herself is very highly functional.  Plans for future visit with MMSE as primary function of the visit.    [name] GERD (gastroesophageal reflux disease) 08/16/2010  . HALLUX VALGUS, ACQUIRED 04/10/2009   Qualifier: Diagnosis of  By: [name] CMA,, [place]    . Hypertension   . HYPERTRIGLYCERIDEMIA 01/05/2007   Qualifier: Diagnosis of  By: Oneida Alar MD, KARL    . Insomnia 02/10/2014  . Left-sided chest wall pain 12/19/2011    L sided chest wall pain follows a dermatomal distribution and raises the possibility of thoracic radiculopathic pain, which would be expected to improve with gabapentin as well. We discussed this at length and she will call to make me aware if this does not get better. Rib belt/girdle in the meantime; may consider rib films or CXR if not improving.     . Metatarsalgia of right foot 11/28/2013   Referral to [facility]   . Myeloma ([place])    prolefica  . Osteopenia   . Restless leg syndrome 06/09/2011  . Restless legs syndrome (RLS)   . URINARY INCONTINENCE, URGE, MILD 11/27/2009   Qualifier: Diagnosis of  By: Zebedee Iba NP, Manuela Schwartz    . Vitamin D deficiency 08/16/2010    SURGICAL HISTORY: Past Surgical History:  Procedure Laterality Date  . BIOPSY BREAST Left 1998   Dr. Rebekah Chesterfield  . BREAST EXCISIONAL BIOPSY    . BREAST LUMPECTOMY Right 02/20/2017   Procedure: RIGHT BREAST LUMPECTOMY;  Surgeon: Jovita Kussmaul, MD;  Location: [facility];  Service: General;  Laterality: Right;  . BREAST LUMPECTOMY WITH RADIOACTIVE SEED LOCALIZATION Left 02/20/2017   Procedure: LEFT BREAST LUMPECTOMY WITH RADIOACTIVE  SEED LOCALIZATION;  Surgeon: Jovita Kussmaul, MD;  Location: [facility];  Service: General;  Laterality: Left;  . CATARACT EXTRACTION  2010   Dr. [name]  . CHOLECYSTECTOMY  1992  . COLONOSCOPY  2008  . MOHS SURGERY  2012   on face [facility]  . NAILBED REPAIR  12/05/2010   Procedure: NAILBED REPAIR;  Surgeon: Cammie Sickle., MD;  Location: [facility];  Service: Orthopedics;  Laterality: Right;  full thickness nail biopsy right hallux  . torn tenden  2012   hand Dr. Orie Rout    I have reviewed the social history and family history with the patient and they are unchanged from previous note.  ALLERGIES:  is allergic to doxycycline; macrodantin [nitrofurantoin]; and sulfamethoxazole-trimethoprim.  MEDICATIONS:  Current Outpatient Medications  Medication Sig Dispense Refill  . anastrozole (ARIMIDEX) 1 MG tablet Take 1 tablet (1 mg total) by mouth daily. 30 tablet 2  . aspirin 81 MG tablet Take 81 mg by mouth daily.      . calcium carbonate (OSCAL) 1500 (600 Ca) MG TABS tablet Take 600 mg of elemental calcium by mouth daily.    . cholecalciferol (VITAMIN D) 1000 UNITS tablet Take 1,000 Units by mouth daily.    [name] docusate sodium (COLACE) 250 MG capsule Take 250 mg by mouth daily.    [name] gabapentin (NEURONTIN) 300 MG capsule Take one capsule by mouth every morning. Take  two capsules by mouth at noon. Take three capsules by mouth in the evening. 180 capsule 5  . Glucosamine-Chondroitin 750-600 MG TABS Take 1 tablet by mouth. Take one tablet daily    . hydrochlorothiazide (HYDRODIURIL) 25 MG tablet TAKE 1 TABLET BY MOUTH EVERY DAY 90 tablet 1  . hydroxyurea (HYDREA) 500 MG capsule TAKE 2 CAPSULES (1,000 MG TOTAL) BY MOUTH DAILY. MAY TAKE WITH FOOD TO MINIMIZE GI SIDE EFFECTS. 60 capsule 2  . ibuprofen (ADVIL,MOTRIN) 200 MG tablet Take 200 mg by mouth as needed.    . Omega-3 Fatty Acids (FISH OIL PO) Take by mouth.    [name] omeprazole (PRILOSEC) 20  MG capsule Take 20 mg by mouth daily.    . polyethylene glycol (MIRALAX / GLYCOLAX) packet Take 17 g by mouth daily.      [name] pyridOXINE (VITAMIN B-6) 100 MG tablet Take 100 mg by mouth daily.    . solifenacin (VESICARE) 10 MG tablet Take 10 mg by mouth daily.     No current facility-administered medications for this visit.     PHYSICAL EXAMINATION: ECOG PERFORMANCE STATUS: 1 - Symptomatic but completely ambulatory  Vitals:   06/09/17 1042  BP: 121/71  Pulse: 76  Resp: 18  Temp: 98.2 F (36.8 C)  SpO2: 96%   Filed Weights   06/09/17 1042  Weight: 134 lb 9.6 oz (61.1 kg)    GENERAL:alert, no distress and comfortable SKIN: no rashes or significant lesions. Mild erythema to right upper back  EYES: normal, Conjunctiva are pink and non-injected, sclera clear OROPHARYNX:no thrush or ulcers  LYMPH:  no palpable cervical, supraclavicular, or axillary lymphadenopathy LUNGS: clear to auscultation with normal breathing effort HEART: regular rate & rhythm and no murmurs and no lower extremity edema ABDOMEN:abdomen soft, non-tender and normal bowel sounds Musculoskeletal:no cyanosis of digits and no clubbing  NEURO: alert & oriented x 3 with fluent speech, no focal motor/sensory deficits BREAST: exam reveals bilateral lumpectomy. Left lateral breast incision is well healed, no erythema or drainage. Right breast s/po lumpectomy and adjuvant radiation, skin with mild hyperpigmentation. Incisions to right breast and axilla are well healed. There is an open wound approx 3 cm long to the inframammary fold, erythema is present but no discharge   LABORATORY DATA:  I have reviewed the data as listed CBC Latest Ref Rng & Units 06/09/2017 01/22/2017 01/21/2017  WBC 3.9 - 10.3 K/uL 2.3(L) 3.1(L) 4.2  Hemoglobin 11.6 - 15.9 g/dL 11.1(L) 12.4 11.7  Hematocrit 34.8 - 46.6 % 32.5(L) 34.2(L) 34.9  Platelets 145 - 400 K/uL 310 447(H) 437(H)     CMP Latest Ref Rng & Units 06/09/2017 01/22/2017 01/22/2017   Glucose 70 - 140 mg/dL 95 94 -  BUN 7 - 26 mg/dL 15 12 -  Creatinine 0.60 - 1.10 mg/dL 0.86 0.74 -  Sodium 136 - 145 mmol/L 132(L) 135 -  Potassium 3.5 - 5.1 mmol/L 3.5 3.8 -  Chloride 98 - 109 mmol/L 95(L) 100 -  CO2 22 - 29 mmol/L 29 30 -  Calcium 8.4 - 10.4 mg/dL 8.9 9.0 -  Total Protein 6.4 - 8.3 g/dL 6.5 6.3 6.3  Total Bilirubin 0.2 - 1.2 mg/dL 0.4 0.6 0.6  Alkaline Phos 40 - 150 U/L 52 - -  AST 5 - 34 U/L 25 22 22   ALT 0 - 55 U/L 31 25 25    SURGICAL PATHOLOGY  Diagnosis 1. Breast, lumpectomy, Left w/ seed - INVASIVE DUCTAL CARCINOMA, GRADE I/III, SPANNING 1.1 CM. - THE SURGICAL  RESECTION MARGINS ARE NEGATIVE FOR CARCINOMA. - SEE ONCOLOGY TABLE BELOW. 2. Breast, lumpectomy, Right - INVASIVE DUCTAL CARCINOMA, GRADE II/III, SPANNING 2.1 CM. - DUCTAL CARCINOMA IN SITU, INTERMEDIATE GRADE. - PERINEURAL INVASION IS IDENTIFIED. - THE SURGICAL RESECTION MARGINS ARE NEGATIVE FOR CARCINOMA. - SEE ONCOLOGY TABLE BELOW. 3. Breast, excision, Right inferior - LOBULAR NEOPLASIA (ATYPICAL LOBULAR HYPERPLASIA). - FAT NECROSIS. - SEE COMMENT.  Microscopic Comment 1. BREAST, INVASIVE TUMOR (LEFT) Procedure: Seed localized lumpectomy Laterality: Left Tumor Size: 1.1 cm (gross measurement). Histologic Type: Ductal Grade: I Tubular Differentiation: 2 Nuclear Pleomorphism: 1 Mitotic Count: 1 Ductal Carcinoma in Situ (DCIS): Not identified Extent of Tumor: Confined to breast parenchyma. Margins: Greater than 0.2 cm to all margins. Regional Lymph Nodes: None examined. Breast Prognostic Profile: Case [id number] Estrogen Receptor: 100%, strong Progesterone Receptor: 100%, strong Her2: No amplification was detected. The ratio was 0.81 Ki-67: 10% Best tumor block for sendout testing: 1B Pathologic Stage Classification (pTNM, AJCC 8th Edition): Primary Tumor (pT): pT1c Regional Lymph Nodes (pN): pNX Distant Metastases (pM): pMX  2. BREAST, INVASIVE TUMOR (RIGHT) Procedure:  Lumpectomy Laterality: Right Tumor Size: 2.1 cm (gross measurement) Histologic Type: Ductal Grade: II Tubular Differentiation: 3 Nuclear Pleomorphism: 2 Mitotic Count: 1 Ductal Carcinoma in Situ (DCIS): Present, intermediate grade Extent of Tumor: Confined to breast parenchyma. Margins: Greater than 0.2 cm to all margins Regional Lymph Nodes: None examined. Breast Prognostic Profile: Case [id number] Estrogen Receptor: 95%, strong Progesterone Receptor: 50%, strong Her2: No amplification was detected. The ratio was 0.81 Ki-67: 30% Best tumor block for sendout testing: 2A Pathologic Stage Classification (pTNM, AJCC 8th Edition): Primary Tumor (pT): pT2 Regional Lymph Nodes (pN): pNX Distant Metastases (pM): pMX 3. The surgical resection margin(s) of the specimen were inked and microscopically evaluated.     RADIOGRAPHIC STUDIES: I have personally reviewed the radiological images as listed and agreed with the findings in the report. No results found.   ASSESSMENT & PLAN: 84 yo female   1.Bilateral breast cancer,invasive ductal carcinoma,right upper inner quadrant, pT2cN0M0, stage IA, ER+/PR+/HER2-, G2,leftupper-outer quadrant, pT1cN0M0, stage IA, ER+/PR+/HER2-, G1 -Ms. Spiewak is clinically doing well, she continues to heal from radiation with skin wound to the right inframammary fold. Applies radiaplex and neosporin. I encouraged her to cut back on NSAIDS, she is taking up to 8 per day. I encouraged her to add tylenol to see if that helps. She will f/u with Dr. Sondra Come next month.  -She is tolerating anastrozole well overall. Breast exam without mass. Labs reviewed. No clinical concern for recurrence. She will continue AI and surveillance with annual mammogram, which will be due 12/2017. She will return for lab and f/u with Dr. Burr Medico in 3 months   2. Essential thrombocytopenia  -Managed by Dr. Beryle Beams on 1000 mg hydrea and aspirin daily. She has worsening leukopenia,  WBC 2.3 today. PLT 310K. I sent him a message with updated labs.   3. Osteopenia  -Last DEXA 11/2014, takes calcium and vitamin D -Plan to repeat DEXA with mammogram 12/2017; I reviewed AI can worsen bone density.   PLAN -Labs reviewed, will send message to Dr. Beryle Beams regarding leukopenia -Continue anastrozole -Continue calcium and vitamin D, will repeat DEXA with mammogram 12/2017 -Annual mammogram due 12/2017 -Continue wound care to R breast, given dressing supplies today   All questions were answered. The patient knows to call the clinic with any problems, questions or concerns. No barriers to learning was detected.     Alla Feeling, NP 06/09/17

## 2017-06-10 ENCOUNTER — Telehealth: Payer: Self-pay | Admitting: *Deleted

## 2017-06-10 DIAGNOSIS — D759 Disease of blood and blood-forming organs, unspecified: Secondary | ICD-10-CM

## 2017-06-10 NOTE — Telephone Encounter (Signed)
-----   Message from Annia Belt, MD sent at 06/09/2017  4:21 PM EDT ----- Please call pt: white count running lower than I would like. Please have her decrease her Hydrea to 2 pills daily alternate with 1 pill daily (1,000 mg alternate with 500 mg). Check CBC in 2 weeks. Please change dose on med list to reflect this. Thanks drG

## 2017-06-10 NOTE — Telephone Encounter (Signed)
Called pt - no answer; left message to call me back. 

## 2017-06-11 ENCOUNTER — Ambulatory Visit: Payer: Self-pay | Admitting: Radiation Oncology

## 2017-06-11 MED ORDER — HYDROXYUREA 500 MG PO CAPS: ORAL_CAPSULE | ORAL | 0 refills | Status: DC

## 2017-06-11 NOTE — Telephone Encounter (Signed)
Pt called / informed " white count running lower than I would like. Please have her decrease her Hydrea to 2 pills daily alternate with 1 pill daily (1,000 mg alternate with 500 mg). Check CBC in 2 weeks." per Dr Beryle Beams. Pt repeated instructions and voiced understanding.

## 2017-06-17 ENCOUNTER — Encounter: Payer: Self-pay | Admitting: *Deleted

## 2017-06-24 ENCOUNTER — Telehealth: Payer: Self-pay | Admitting: *Deleted

## 2017-06-24 ENCOUNTER — Other Ambulatory Visit: Payer: Self-pay | Admitting: Oncology

## 2017-06-24 DIAGNOSIS — D759 Disease of blood and blood-forming organs, unspecified: Secondary | ICD-10-CM

## 2017-06-24 MED ORDER — HYDROXYUREA 500 MG PO CAPS: ORAL_CAPSULE | ORAL | 0 refills | Status: DC

## 2017-06-24 NOTE — Telephone Encounter (Addendum)
Received fax from [facility] with results of CBC drawn 06/23/2017. Given to Dr. Beryle Beams. Hubbard Hartshorn, RN, BSN

## 2017-06-24 NOTE — Telephone Encounter (Signed)
Messages left on home and mobile VM requesting return call. Hubbard Hartshorn, RN, BSN

## 2017-06-24 NOTE — Telephone Encounter (Signed)
Please call pt: decrease Hydrea to 1 capsule daily. Continue monthly CBC,diff

## 2017-06-24 NOTE — Telephone Encounter (Signed)
Patient returned call. Informed of Dr. Azucena Freed instructions. States she has blood draw every other month and will need new order if CBC to be done monthly. Will route to Dr. Beryle Beams for clarification. Hubbard Hartshorn, RN, BSN

## 2017-07-02 ENCOUNTER — Ambulatory Visit
Admission: RE | Admit: 2017-07-02 | Discharge: 2017-07-02 | Disposition: A | Discharge status: Home / Self Care | Payer: Medicare Other | Source: Ambulatory Visit | Attending: Radiation Oncology | Admitting: Radiation Oncology

## 2017-07-02 ENCOUNTER — Other Ambulatory Visit: Payer: Self-pay

## 2017-07-02 ENCOUNTER — Encounter: Payer: Self-pay | Admitting: Radiation Oncology

## 2017-07-02 VITALS — BP 155/78 | HR 70 | Temp 98.4°F | Resp 18 | Wt 135.0 lb

## 2017-07-02 DIAGNOSIS — Z170 Estrogen receptor positive status [ER+]: Principal | ICD-10-CM

## 2017-07-02 DIAGNOSIS — C50211 Malignant neoplasm of upper-inner quadrant of right female breast: Secondary | ICD-10-CM

## 2017-07-02 NOTE — Progress Notes (Signed)
Radiation Oncology         [phone] ________________________________  Name: Jill Myers MRN: [id number]  Date: 07/02/2017  DOB: 07/23/1930  Follow-Up Visit Note  CC: [name], MD  Jovita Kussmaul, MD  No diagnosis found.  Diagnosis:  84 y.o. female with bilateral breast cancer; RIGHT upper inner quadrant invasive ductal carcinoma pT2cN0M0, stage II-A, ER+/PR+/Her2-, Grade 2;LEFT upper outer invasive ductal carcinoma pT1cN0M0, stage IA, ER+/PR+/Her2-, Grade 1     Interval Since Last Radiation:  2 months, 21 days Radiation treatment dates:   03/31/2017 - 05/11/2017  Site/dose:    1. Right Breast / 50 Gy in 25 fractions 2. Right Axilla / 45 Gy in 25 fractions 3. Right Breast Boost / 10 Gy in 5 fractions, no radiation therapy given to the left breast in light of her early stage disease on this side.  Narrative:  The patient returns today for close follow-up. She is feeling better overall, but notes that the area is still scarred and she is unable to wear a bra for more than a couple hours a day.   On review of systems, she denies left breast discomfort and any other symptoms. Pertinent positives are listed and detailed within the above HPI.                            ALLERGIES:  is allergic to doxycycline; macrodantin [nitrofurantoin]; and sulfamethoxazole-trimethoprim.  Meds: Current Outpatient Medications  Medication Sig Dispense Refill  . anastrozole (ARIMIDEX) 1 MG tablet Take 1 tablet (1 mg total) by mouth daily. 30 tablet 2  . aspirin 81 MG tablet Take 81 mg by mouth daily.      . calcium carbonate (OSCAL) 1500 (600 Ca) MG TABS tablet Take 600 mg of elemental calcium by mouth daily.    . cholecalciferol (VITAMIN D) 1000 UNITS tablet Take 1,000 Units by mouth daily.    [name] docusate sodium (COLACE) 250 MG capsule Take 250 mg by mouth daily.    [name] gabapentin (NEURONTIN) 300 MG capsule Take one capsule by mouth every morning. Take two capsules by mouth at noon. Take  three capsules by mouth in the evening. 180 capsule 5  . Glucosamine-Chondroitin 750-600 MG TABS Take 1 tablet by mouth. Take one tablet daily    . hydrochlorothiazide (HYDRODIURIL) 25 MG tablet TAKE 1 TABLET BY MOUTH EVERY DAY 90 tablet 1  . hydroxyurea (HYDREA) 500 MG capsule Decrease to 1 capsule daily 60 capsule 0  . ibuprofen (ADVIL,MOTRIN) 200 MG tablet Take 200 mg by mouth as needed.    . Omega-3 Fatty Acids (FISH OIL PO) Take by mouth.    [name] omeprazole (PRILOSEC) 20 MG capsule Take 20 mg by mouth daily.    . polyethylene glycol (MIRALAX / GLYCOLAX) packet Take 17 g by mouth daily.      [name] pyridOXINE (VITAMIN B-6) 100 MG tablet Take 100 mg by mouth daily.    . solifenacin (VESICARE) 10 MG tablet Take 10 mg by mouth daily.     No current facility-administered medications for this encounter.     Physical Findings: The patient is in no acute distress. Patient is alert and oriented.  weight is 135 lb (61.2 kg). Her temperature is 98.4 F (36.9 C). Her blood pressure is 155/78 (abnormal) and her pulse is 70. Her respiration is 18 and oxygen saturation is 95%. . Lungs are clear to auscultation bilaterally. Heart has regular rate and rhythm. No  palpable cervical, supraclavicular, or axillary adenopathy. Abdomen soft, non-tender, normal bowel sounds.   Left breast with no palpable mass, bleeding, or nipple discharge. Right breast skin has healed along breast area with hyperpigmentation and mild edema. She continues to have 2 erythematous areas in the inframammary fold each measuring approximately 1 x 2 cm in size. No further moist desquamation noted.    Lab Findings: Lab Results  Component Value Date   WBC 2.3 (L) 06/09/2017   HGB 11.1 (L) 06/09/2017   HCT 32.5 (L) 06/09/2017   MCV 119.4 (H) 06/09/2017   PLT 310 06/09/2017    Radiographic Findings: No results found.  Impression:  No evidence of recurrence. Patient continues to have discomfort in the right inframammary fold where she  had skin breakdown. She stopped using creams in this area, which I think is good at this point. Will keep the area exposed to air as much as possible   Plan:  She plans to return for return follow up in 3 months, but will call sooner if she develops skin problem in the right inframammary fold.   ____________________________________ -----------------------------------  Blair Promise, PhD, MD  This document serves as a record of services personally performed by Gery Pray, MD. It was created on his behalf by [place], a trained medical scribe. The creation of this record is based on the scribe's personal observations and the provider's statements to them. This document has been checked and approved by the attending provider.

## 2017-07-02 NOTE — Progress Notes (Signed)
Pt is here today for follow up with Dr. Sondra Come. Pt states she is having tenderness and soreness under right breast, especially if she wears a bra for more than a few hours. Pt reports improved fatigue and states it is normal "for her age". Pt has two red, painful areas under the breast. Pt does not use Radiaplex.   BP (!) 155/78   Pulse 70   Temp 98.4 F (36.9 C)   Resp 18   Wt 135 lb (61.2 kg)   SpO2 95%   BMI 23.91 kg/m   Wt Readings from Last 3 Encounters:  07/02/17 135 lb (61.2 kg)  06/09/17 134 lb 9.6 oz (61.1 kg)  06/02/17 135 lb 2 oz (61.3 kg)

## 2017-07-08 NOTE — Telephone Encounter (Signed)
Please Fax Rx to [facility] to add CBC,diff on 07/13/17

## 2017-07-10 ENCOUNTER — Other Ambulatory Visit: Payer: Self-pay | Admitting: Internal Medicine

## 2017-07-13 NOTE — Telephone Encounter (Signed)
Order was faxed to [facility] on July 3 and pt was called and informed. Order to be scanned.

## 2017-07-20 ENCOUNTER — Other Ambulatory Visit: Payer: Self-pay | Admitting: Oncology

## 2017-07-20 DIAGNOSIS — D759 Disease of blood and blood-forming organs, unspecified: Secondary | ICD-10-CM

## 2017-07-30 ENCOUNTER — Other Ambulatory Visit: Payer: Self-pay | Admitting: Internal Medicine

## 2017-08-06 ENCOUNTER — Encounter

## 2017-08-06 ENCOUNTER — Encounter: Payer: Self-pay | Admitting: Sports Medicine

## 2017-08-06 ENCOUNTER — Ambulatory Visit: Payer: Medicare Other | Admitting: Sports Medicine

## 2017-08-06 VITALS — BP 133/68 | Ht 63.0 in | Wt 132.0 lb

## 2017-08-06 DIAGNOSIS — M2011 Hallux valgus (acquired), right foot: Secondary | ICD-10-CM | POA: Diagnosis not present

## 2017-08-06 DIAGNOSIS — M7742 Metatarsalgia, left foot: Secondary | ICD-10-CM

## 2017-08-06 DIAGNOSIS — M7741 Metatarsalgia, right foot: Secondary | ICD-10-CM

## 2017-08-06 NOTE — Patient Instructions (Signed)
Try the correction we did today for 1 week. If it's helping come back and see [place] in a month to get another pair of orthotics made with Dr. Lunette Stands. Call us in the meantime with any questions!

## 2017-08-06 NOTE — Progress Notes (Signed)
Jill Myers - 84 y.o. female MRN [id number]  Date of birth: 11-22-1930   Chief complaint: Bilateral forefoot pain  SUBJECTIVE:    History of present illness: Patient presents today for evaluation of bilateral forefoot pain right worse than the left.  She was last seen in this clinic approximately 2 years ago where she was diagnosed with chronic metatarsalgia.  She has benefited from orthotics for several years.  If she does not wear her orthotics, she feels that she is unable to walk.  She reports she has been using her orthotics.  Her forefoot pain began approximately 2 to 3 months ago.  She did not have any significant injury that she remembers.  She does complain of burning in her toes especially second, third and fourth.  She is an avid walker and has stopped walking the past 2 to 3 months due to the pain.  He continues to do balance and yoga classes sometimes several times a day.  She denies any complaints of ankle pain, knee pain, or hip pain.  Denies any numbness of her toes or extremities.  Denies any loss of bowel or bladder control.  No significant foot swelling.   Review of systems:  As stated above   Interval past medical history, surgical history, family history, and social history obtained and are unchanged.   Extensive past medical history was reviewed including breast cancer, thrombocytopenia, osteopenia, and hypertension.  Medication list was also reviewed and noted.  OBJECTIVE:  Physical exam: Vital signs are reviewed. BP 133/68   Ht 5\' 3"  (1.6 m)   Wt 132 lb (59.9 kg)   BMI 23.38 kg/m   Gen.: Alert, oriented, appears stated age, in no apparent distress Respiratory: Normal respirations, able to speak in full sentences Cardiac: Regular rate, distal pulses 2+ Neurologic:  Sensation intact to light touch L4-S1.  Negative Tinel's at the medial ankle. Gait: normal without associated limp Musculoskeletal: Inspection of the feet bilaterally demonstrate loss of transverse  arch.  Hallux valgus on right noted. She also has prominent metatarsal heads which are tender to palpation.  No exquisite tenderness between the second and third and third and fourth metatarsals.  No pain with squeeze test of the midfoot.  Patient has full range of motion and ankle dorsiflexion and plantarflexion.  Strength testing 5 out of 5 in ankle plantar flexion dorsiflexion.  EHL and FHL are intact.  Negative ankle inversion test.  Negative ankle Eversion test.  Negative anterior drawer.  Dorsalis pedis pulse 2 out of 4.    ASSESSMENT & PLAN: Chronic metatarsalgia Hallux Valgus   Plan: I did inspect her current orthotics which appear to be in good condition.  I am recommending a metatarsal pad to be placed bilaterally.  These modifications were made to help increase her transverse arch which has decreased significantly likely as a result of an age-related process.  The patient did demo her new orthotics with the modifications and felt like they were comfortable.  She is to follow-up in the next 3 to 4 weeks for a second pair of orthotics to be made if she desires.  If she has any problems in the meantime, she may come in and receive any other modifications.  Patient is in agreement and understanding with this plan.  As for her hallux valgus, this is not currently bothering her nor is it causing her second and third toes to deviate dorsally, so we will continue conservative therapy for this.  Jill Laming, DO Sports Medicine  Fellow [place] observed and examined the patient with the [facility] Fellow and agree with assessment and plan.  Note reviewed and modified by me. Ila Mcgill, MD

## 2017-08-09 ENCOUNTER — Other Ambulatory Visit: Payer: Self-pay | Admitting: Hematology

## 2017-08-09 DIAGNOSIS — Z170 Estrogen receptor positive status [ER+]: Principal | ICD-10-CM

## 2017-08-09 DIAGNOSIS — C50211 Malignant neoplasm of upper-inner quadrant of right female breast: Secondary | ICD-10-CM

## 2017-08-11 ENCOUNTER — Other Ambulatory Visit: Payer: Self-pay | Admitting: Nurse Practitioner

## 2017-08-11 DIAGNOSIS — C50211 Malignant neoplasm of upper-inner quadrant of right female breast: Secondary | ICD-10-CM

## 2017-08-11 DIAGNOSIS — Z170 Estrogen receptor positive status [ER+]: Principal | ICD-10-CM

## 2017-08-11 MED ORDER — ANASTROZOLE 1 MG PO TABS: 1.0000 mg | ORAL_TABLET | Freq: Every day | ORAL | 3 refills | Status: DC

## 2017-08-19 ENCOUNTER — Encounter: Payer: Self-pay | Admitting: Internal Medicine

## 2017-09-01 ENCOUNTER — Other Ambulatory Visit: Payer: Self-pay | Admitting: *Deleted

## 2017-09-01 DIAGNOSIS — I10 Essential (primary) hypertension: Secondary | ICD-10-CM

## 2017-09-01 DIAGNOSIS — E785 Hyperlipidemia, unspecified: Secondary | ICD-10-CM

## 2017-09-01 DIAGNOSIS — G4700 Insomnia, unspecified: Secondary | ICD-10-CM

## 2017-09-01 DIAGNOSIS — D473 Essential (hemorrhagic) thrombocythemia: Secondary | ICD-10-CM

## 2017-09-03 ENCOUNTER — Other Ambulatory Visit: Payer: Self-pay | Admitting: Internal Medicine

## 2017-09-03 NOTE — Telephone Encounter (Signed)
A medication refill was received from pharmacy for gabapentin 300 mg. Prescription was pended to provider due to pharmacy requesting a quantity of # 540 with 2 refills.

## 2017-09-06 ENCOUNTER — Other Ambulatory Visit: Payer: Self-pay | Admitting: Oncology

## 2017-09-06 DIAGNOSIS — D473 Essential (hemorrhagic) thrombocythemia: Secondary | ICD-10-CM

## 2017-09-06 DIAGNOSIS — D759 Disease of blood and blood-forming organs, unspecified: Secondary | ICD-10-CM

## 2017-09-07 NOTE — Progress Notes (Signed)
[employer]  [phone] [phone] [place] Note   Patient Care Team: [name], MD as PCP - General (Internal Medicine) Annia Belt, MD as Consulting Physician (Oncology) [name], MD as Consulting Physician (Dermatology) [name], MD as Consulting Physician (Sports Medicine) [name], MD (Inactive) as Consulting Physician (Family Medicine) Jovita Kussmaul, MD as Consulting Physician (General Surgery) Raye Sorrow, MD as Consulting Physician (Pulmonary Disease) Truitt Merle, MD as Consulting Physician (Hematology) Gery Pray, MD as Consulting Physician (Radiation Oncology) 09/09/2017  CHIEF COMPLAINTS/PURPOSE OF CONSULTATION:  Malignant neoplasm of the right and left breast  Oncology History   Cancer Staging Malignant neoplasm of upper-inner quadrant of right breast in female, estrogen receptor positive ([place]) Staging form: Breast, AJCC 8th Edition - Clinical stage from 01/07/2017: Stage IA (cT1c, cN0, cM0, G3, ER: Positive, PR: Positive, HER2: Negative) - Signed by Truitt Merle, MD on 01/20/2017  Malignant neoplasm of upper-outer quadrant of left breast in female, estrogen receptor positive ([place]) Staging form: Breast, AJCC 8th Edition - Clinical stage from 01/07/2017: Stage IA (cT1b, cN0, cM0, G2, ER: Positive, PR: Positive, HER2: Negative) - Signed by Truitt Merle, MD on 01/20/2017       Malignant neoplasm of upper-inner quadrant of right breast in female, estrogen receptor positive ([place])   01/01/2017 Mammogram    IMPRESSION: 1. 2.0 cm mass in the 2 o'clock position of the right breast with imaging features highly suspicious for malignancy. 2. 0.7 cm mass in the 2 o'clock position of the left breast with imaging features highly suspicious for malignancy. 3. No abnormal appearing axillary lymph nodes on either side.    01/07/2017 Receptors her2    Right Breast: Prognostic indicators significant for: ER,  95% positive and PR, 50% positive, both with strong staining intensity. Proliferation marker Ki67 at 30. HER2 negative. RATIO OF HER2/CEP17 SIGNALS 0.81 AVERAGE HER2 COPY NUMBER PER CELL 1.70  Left Breast: Prognostic indicators significant for: ER, 100% positive and PR, 100% positive, both with strong staining intensity. Proliferation marker Ki67 at 10. HER2 negative. RATIO OF HER2/CEP17 SIGNALS 1.75 AVERAGE HER2 COPY NUMBER PER CELL 3.15    01/07/2017 Initial Biopsy    Diagnosis 1. Breast, right, needle core biopsy, 2:00 o'clock - INVASIVE DUCTAL CARCINOMA, SEE COMMENT. 2. Breast, left, needle core biopsy, 2:00 o'clock - INVASIVE MAMMARY CARCINOMA, SEE COMMENT.     01/20/2017 Initial Diagnosis    Malignant neoplasm of upper-inner quadrant of right breast in female, estrogen receptor positive ([place])    01/2017 -  Anti-estrogen oral therapy    Anastrozole 73m daily started on 01/2017    02/20/2017 Surgery    RIGHT BREAST LUMPECTOMY (Right) LEFT BREAST LUMPECTOMY WITH RADIOACTIVE SEED LOCALIZATION (Left)  Per Dr. TMarlou Starks    02/20/2017 Pathology Results     Diagnosis 1. Breast, lumpectomy, Left w/ seed - INVASIVE DUCTAL CARCINOMA, GRADE I/III, SPANNING 1.1 CM. - THE SURGICAL RESECTION MARGINS ARE NEGATIVE FOR CARCINOMA. - SEE ONCOLOGY TABLE BELOW. 2. Breast, lumpectomy, Right - INVASIVE DUCTAL CARCINOMA, GRADE II/III, SPANNING 2.1 CM. - DUCTAL CARCINOMA IN SITU, INTERMEDIATE GRADE. - PERINEURAL INVASION IS IDENTIFIED. - THE SURGICAL RESECTION MARGINS ARE NEGATIVE FOR CARCINOMA. - SEE ONCOLOGY TABLE BELOW. 3. Breast, excision, Right inferior - LOBULAR NEOPLASIA (ATYPICAL LOBULAR HYPERPLASIA). - FAT NECROSIS. - SEE COMMENT.  Microscopic Comment 1. BREAST, INVASIVE TUMOR (LEFT) Procedure: Seed localized lumpectomy Laterality: Left Tumor Size: 1.1 cm (gross measurement). Histologic Type: Ductal Grade: I Tubular Differentiation: 2 Nuclear Pleomorphism:  1 Mitotic Count:  1 Ductal Carcinoma in Situ (DCIS): Not identified Extent of Tumor: Confined to breast parenchyma. Margins: Greater than 0.2 cm to all margins. Regional Lymph Nodes: None examined. Breast Prognostic Profile: Case [id number] Estrogen Receptor: 100%, strong Progesterone Receptor: 100%, strong Her2: No amplification was detected. The ratio was 0.81 Ki-67: 10% Best tumor block for sendout testing: 1B Pathologic Stage Classification (pTNM, AJCC 8th Edition): Primary Tumor (pT): pT1c Regional Lymph Nodes (pN): pNX Distant Metastases (pM): pMX  2. BREAST, INVASIVE TUMOR (RIGHT) Procedure: Lumpectomy Laterality: Right Tumor Size: 2.1 cm (gross measurement) Histologic Type: Ductal Grade: II Tubular Differentiation: 3 Nuclear Pleomorphism: 2 Mitotic Count: 1 Ductal Carcinoma in Situ (DCIS): Present, intermediate grade Extent of Tumor: Confined to breast parenchyma. Margins: Greater than 0.2 cm to all margins Regional Lymph Nodes: None examined. Breast Prognostic Profile: Case [id number] Estrogen Receptor: 95%, strong Progesterone Receptor: 50%, strong Her2: No amplification was detected. The ratio was 0.81 Ki-67: 30% Best tumor block for sendout testing: 2A Pathologic Stage Classification (pTNM, AJCC 8th Edition): Primary Tumor (pT): pT2 Regional Lymph Nodes (pN): pNX Distant Metastases (pM): pMX 3. The surgical resection margin(s) of the specimen were inked and microscopically evaluated.     Malignant neoplasm of upper-outer quadrant of left breast in female, estrogen receptor positive ([place])   01/20/2017 Initial Diagnosis    Malignant neoplasm of upper-outer quadrant of left breast in female, estrogen receptor positive ([place])    02/20/2017 Surgery    RIGHT BREAST LUMPECTOMY (Right) LEFT BREAST LUMPECTOMY WITH RADIOACTIVE SEED LOCALIZATION (Left)  Per Dr. Marlou Starks        HISTORY OF PRESENTING ILLNESS:  Jill Myers 84 y.o. female is a here because of newly diagnosed  malignant neoplasm of the right and left breast. The patient presents to the clinic today accompanied by her friend Katharine Look.  Pt first felt a mass in her breast 2 weeks ago. She has noticed no other change, she has no symptoms of fatigue, decreased appetite, or arthritis. Her diagnostic mammogram on 01/01/17 revealing a mass in both of her breasts. Her initial biopsy on 01/07/17 revealed invasive ductal carcinoma of the right breast and invasive mammary carcinoma in the left breast.   Right Breast: Prognostic indicators significant for: ER, 95% positive and PR, 50% positive, both with strong staining intensity. Proliferation marker Ki67 at 30. HER2 negative.  Left Breast: Prognostic indicators significant for: ER, 100% positive and PR, 100% positive, both with strong staining intensity. Proliferation marker Ki67 at 10. HER2 negative.  In the past the patient was diagnosed with significant comorbidities of essential Thrombocytopenia. She is treated with hydrea by Dr. Beryle Beams. She has no FHx of CA. Pt used tobacco as a teenager but does not currently. She drinks alcohol occassionally.   Socially, she lives in a retirement home alone and exercises almost daily through programs there.   GYN HISTORY  Menarchal: 14 LMP: late 64s Contraceptive: HRT: Yes, for 3 years following menopause  GP: 3/2, first birth at 43  CURRENT THERAPY Anastrozole 1 mg daily started on 01/2017  INTERVAL HISTORY  Jill Myers is a 84 y.o. female who is here for follow-up. She had radiation therapy with Dr. Sondra Come from 04/01/2017 to 05/11/2017. She is here alone at the clinic. She is doing well and tolerating Anastrozole well. She is requesting a bone scan and a mammogram. She states that she had bad pain and skin peeling after radiation. She also complains of dry cough.   MEDICAL HISTORY:  Past  Medical History:  Diagnosis Date  . Bladder cystocele 05/21/2010  . Complication of anesthesia    deglutition in history  .  Deglutition disorder 08/17/2012   Single episode of choking/aspiration; preceded by trouble swallowing saliva (thin liquids).  For SLP evaluation. Aug 17, 2012.    [name] Disease of blood and blood forming organ 01/05/2007   Qualifier: Diagnosis of  By: Oneida Alar MD, KARL    . Diverticulitis   . DIVERTICULOSIS OF COLON 03/05/2006   Qualifier: Diagnosis of  By: Samara Snide    . DIZZINESS 01/18/2010   Qualifier: Diagnosis of  By: Lindell Noe MD, Jeneen Rinks    . Essential hypertension, benign 02/22/2008   Qualifier: Diagnosis of  By: Lindell Noe MD, Jeneen Rinks     . Essential thrombocythemia ([place]) 01/31/11  . Family history of Alzheimer's disease 11/11/2013   Patient with family history of Alzheimers disease. She herself is very highly functional.  Plans for future visit with MMSE as primary function of the visit.    [name] GERD (gastroesophageal reflux disease) 08/16/2010  . HALLUX VALGUS, ACQUIRED 04/10/2009   Qualifier: Diagnosis of  By: [name] CMA,, [place]    . Hypertension   . HYPERTRIGLYCERIDEMIA 01/05/2007   Qualifier: Diagnosis of  By: Oneida Alar MD, KARL    . Insomnia 02/10/2014  . Left-sided chest wall pain 12/19/2011    L sided chest wall pain follows a dermatomal distribution and raises the possibility of thoracic radiculopathic pain, which would be expected to improve with gabapentin as well. We discussed this at length and she will call to make me aware if this does not get better. Rib belt/girdle in the meantime; may consider rib films or CXR if not improving.     . Metatarsalgia of right foot 11/28/2013   Referral to [facility]   . Myeloma ([place])    prolefica  . Osteopenia   . Restless leg syndrome 06/09/2011  . Restless legs syndrome (RLS)   . URINARY INCONTINENCE, URGE, MILD 11/27/2009   Qualifier: Diagnosis of  By: Zebedee Iba NP, Manuela Schwartz    . Vitamin D deficiency 08/16/2010    SURGICAL HISTORY: Past Surgical History:  Procedure Laterality Date  . BIOPSY BREAST Left 1998   Dr. Rebekah Chesterfield  . BREAST EXCISIONAL BIOPSY    . BREAST  LUMPECTOMY Right 02/20/2017   Procedure: RIGHT BREAST LUMPECTOMY;  Surgeon: Jovita Kussmaul, MD;  Location: [facility];  Service: General;  Laterality: Right;  . BREAST LUMPECTOMY WITH RADIOACTIVE SEED LOCALIZATION Left 02/20/2017   Procedure: LEFT BREAST LUMPECTOMY WITH RADIOACTIVE SEED LOCALIZATION;  Surgeon: Jovita Kussmaul, MD;  Location: [facility];  Service: General;  Laterality: Left;  . CATARACT EXTRACTION  2010   Dr. [name]  . CHOLECYSTECTOMY  1992  . COLONOSCOPY  2008  . MOHS SURGERY  2012   on face [facility] [place]  . NAILBED REPAIR  12/05/2010   Procedure: NAILBED REPAIR;  Surgeon: Cammie Sickle., MD;  Location: [facility];  Service: Orthopedics;  Laterality: Right;  full thickness nail biopsy right hallux  . torn tenden  2012   hand Dr. Orie Rout    SOCIAL HISTORY: Social History   Socioeconomic History  . Marital status: Widowed    Spouse name: Not on file  . Number of children: 2  . Years of education: bachelors  . Highest education level: Not on file  Occupational History  . Occupation: Tree surgeon: UNEMPLOYED  . Occupation: Retired- Paramedic  Social Needs  .  Financial resource strain: Not on file  . Food insecurity:    Worry: Not on file    Inability: Not on file  . Transportation needs:    Medical: Not on file    Non-medical: Not on file  Tobacco Use  . Smoking status: Former Smoker    Years: 3.00    Last attempt to quit: 1958    Years since quitting: 61.7  . Smokeless tobacco: Never Used  Substance and Sexual Activity  . Alcohol use: Yes    Alcohol/week: 0.0 standard drinks    Comment: Wine rarely.  . Drug use: No  . Sexual activity: Not on file  Lifestyle  . Physical activity:    Days per week: Not on file    Minutes per session: Not on file  . Stress: Not on file  Relationships  . Social connections:    Talks on phone: Not on file    Gets together: Not on file     Attends religious service: Not on file    Active member of club or organization: Not on file    Attends meetings of clubs or organizations: Not on file    Relationship status: Not on file  . Intimate partner violence:    Fear of current or ex partner: Not on file    Emotionally abused: Not on file    Physically abused: Not on file    Forced sexual activity: Not on file  Other Topics Concern  . Not on file  Social History Narrative   Health Care POA: [name]- Son   Emergency Contact: neighbor, [name] [phone]   End of Life Plan: POA, Living Will   Widow   [facility] at [facility] since about 2013.   Any pets: none   Diet: Pt has a varied diet of protein, starch, vegetables   Exercise: Pt exercises 3x week for 1 hour with group.   Seatbelts: Pt reports wearing seatbelt when in vehicles.    Sun Exposure/Protection: pt does not wear sun protection   Hobbies: opera, symphony, reading, walking       FAMILY HISTORY: Family History  Problem Relation Age of Onset  . Heart disease Mother   . Tuberculosis Father   . Heart disease Sister   . Heart disease Brother   . Alzheimer's disease Brother   . Alzheimer's disease Brother     ALLERGIES:  is allergic to doxycycline; macrodantin [nitrofurantoin]; and sulfamethoxazole-trimethoprim.  MEDICATIONS:  Current Outpatient Medications  Medication Sig Dispense Refill  . anastrozole (ARIMIDEX) 1 MG tablet Take 1 tablet (1 mg total) by mouth daily. 30 tablet 3  . aspirin 81 MG tablet Take 81 mg by mouth daily.      . calcium carbonate (OSCAL) 1500 (600 Ca) MG TABS tablet Take 600 mg of elemental calcium by mouth daily.    . cholecalciferol (VITAMIN D) 1000 UNITS tablet Take 1,000 Units by mouth daily.    [name] docusate sodium (COLACE) 250 MG capsule Take 250 mg by mouth daily.    [name] gabapentin (NEURONTIN) 300 MG capsule TAKE ONE CAPSULE BY MOUTH EVERY MORNING. TAKE TWO CAPSULES AT NOON.TAKE 3 CAPSULES EVERY EVENING  (Patient taking differently: TAKE ONE CAPSULE BY MOUTH EVERY MORNING AND THREE IN THE EVENING.) 540 capsule 2  . Glucosamine-Chondroitin 750-600 MG TABS Take 1 tablet by mouth. Take one tablet daily    . hydrochlorothiazide (HYDRODIURIL) 25 MG tablet TAKE 1 TABLET BY MOUTH EVERY DAY 90 tablet 1  . hydroxyurea (  HYDREA) 500 MG capsule Take 1 capsule (500 mg total) by mouth daily. 60 capsule 0  . ibuprofen (ADVIL,MOTRIN) 200 MG tablet Take 200 mg by mouth as needed.    . Omega-3 Fatty Acids (FISH OIL PO) Take by mouth.    [name] omeprazole (PRILOSEC) 20 MG capsule Take 20 mg by mouth daily.    . polyethylene glycol (MIRALAX / GLYCOLAX) packet Take 17 g by mouth daily.      [name] pyridOXINE (VITAMIN B-6) 100 MG tablet Take 100 mg by mouth daily.    . solifenacin (VESICARE) 10 MG tablet Take 5 mg by mouth daily.      No current facility-administered medications for this visit.     REVIEW OF SYSTEMS:  Constitutional: Denies fevers, chills or abnormal night sweats Eyes: Denies blurriness of vision, double vision or watery eyes Ears, nose, mouth, throat, and face: Denies mucositis or sore throat Respiratory: Denies dyspnea or wheezes (+) dry cough  Cardiovascular: Denies palpitation, chest discomfort or lower extremity swelling Gastrointestinal:  Denies nausea, heartburn or change in bowel habits Skin: Denies abnormal skin rashes Lymphatics: Denies new lymphadenopathy or easy bruising Neurological:Denies numbness, tingling or new weaknesses MSK: (+) leg cramps Behavioral/Psych: Mood is stable, no new changes  All other systems were reviewed with the patient and are negative. Breast: (+) breast mass (+) skin peeling around nipple and pain at radiation area  PHYSICAL EXAMINATION:  ECOG PERFORMANCE STATUS: 0 - Asymptomatic  Vitals:   09/09/17 1357  BP: 117/65  Pulse: 74  Resp: 17  Temp: 98.4 F (36.9 C)  SpO2: 94%   Filed Weights   09/09/17 1357  Weight: 133 lb 3.2 oz (60.4 kg)     GENERAL:alert, no distress and comfortable SKIN: skin color, texture, turgor are normal, no rashes or significant lesions EYES: normal, conjunctiva are pink and non-injected, sclera clear OROPHARYNX:no exudate, no erythema and lips, buccal mucosa, and tongue normal  NECK: supple, thyroid normal size, non-tender, without nodularity LYMPH:  no palpable lymphadenopathy in the cervical, axillary or inguinal LUNGS: clear to auscultation and percussion with normal breathing effort HEART: regular rate & rhythm and no lower extremity edema (+) heart murmur ABDOMEN:abdomen soft, non-tender and normal bowel sounds Musculoskeletal:no cyanosis of digits and no clubbing  PSYCH: alert & oriented x 3 with fluent speech NEURO: no focal motor/sensory deficits R BREAST: (+) skin peeling and depigmentation from radiation. Overall healing well.  L BREAST: No palpable mass.  LABORATORY DATA:  I have reviewed the data as listed CBC Latest Ref Rng & Units 09/09/2017 06/09/2017 01/22/2017  WBC 3.9 - 10.3 K/uL 6.4 2.3(L) 3.1(L)  Hemoglobin 11.6 - 15.9 g/dL 13.4 11.1(L) 12.4  Hematocrit 34.8 - 46.6 % 40.2 32.5(L) 34.2(L)  Platelets 145 - 400 K/uL 721(H) 310 447(H)    CMP Latest Ref Rng & Units 09/09/2017 06/09/2017 01/22/2017  Glucose 70 - 99 mg/dL 112(H) 95 94  BUN 8 - 23 mg/dL 16 15 12   Creatinine 0.44 - 1.00 mg/dL 0.82 0.86 0.74  Sodium 135 - 145 mmol/L 136 132(L) 135  Potassium 3.5 - 5.1 mmol/L 3.7 3.5 3.8  Chloride 98 - 111 mmol/L 100 95(L) 100  CO2 22 - 32 mmol/L 28 29 30   Calcium 8.9 - 10.3 mg/dL 9.1 8.9 9.0  Total Protein 6.5 - 8.1 g/dL 6.7 6.5 6.3  Total Bilirubin 0.3 - 1.2 mg/dL 0.5 0.4 0.6  Alkaline Phos 38 - 126 U/L 60 52 -  AST 15 - 41 U/L 20 25 22  ALT 0 - 44 U/L 27 31 25     Surgery 02/20/2017: Right breast lumpectomy with Dr. Marlou Starks   PATHOLOGY  02/20/2017 Surgical Pathology Diagnosis 1. Breast, lumpectomy, Left w/ seed - INVASIVE DUCTAL CARCINOMA, GRADE I/III, SPANNING 1.1 CM. - THE  SURGICAL RESECTION MARGINS ARE NEGATIVE FOR CARCINOMA. - SEE ONCOLOGY TABLE BELOW. 2. Breast, lumpectomy, Right - INVASIVE DUCTAL CARCINOMA, GRADE II/III, SPANNING 2.1 CM. - DUCTAL CARCINOMA IN SITU, INTERMEDIATE GRADE. - PERINEURAL INVASION IS IDENTIFIED. - THE SURGICAL RESECTION MARGINS ARE NEGATIVE FOR CARCINOMA. - SEE ONCOLOGY TABLE BELOW. 3. Breast, excision, Right inferior - LOBULAR NEOPLASIA (ATYPICAL LOBULAR HYPERPLASIA). - FAT NECROSIS. - SEE COMMENT. Microscopic Comment 1. BREAST, INVASIVE TUMOR (LEFT) Procedure: Seed localized lumpectomy Laterality: Left Tumor Size: 1.1 cm (gross measurement). Histologic Type: Ductal Grade: I Tubular Differentiation: 2 Nuclear Pleomorphism: 1 Mitotic Count: 1 Ductal Carcinoma in Situ (DCIS): Not identified Extent of Tumor: Confined to breast parenchyma. Margins: Greater than 0.2 cm to all margins. Regional Lymph Nodes: None examined. Breast Prognostic Profile: Case [id number] Estrogen Receptor: 100%, strong Progesterone Receptor: 100%, strong Her2: No amplification was detected. The ratio was 0.81 Ki-67: 10% Best tumor block for sendout testing: 1B Pathologic Stage Classification (pTNM, AJCC 8th Edition): Primary Tumor (pT): pT1c Regional Lymph Nodes (pN): pNX Distant Metastases (pM): pMX 2. BREAST, INVASIVE TUMOR (RIGHT) Procedure: Lumpectomy Laterality: Right Tumor Size: 2.1 cm (gross measurement) Histologic Type: Ductal Grade: II Tubular Differentiation: 3 Nuclear Pleomorphism: 2 Mitotic Count: 1 Ductal Carcinoma in Situ (DCIS): Present, intermediate grade Extent of Tumor: Confined to breast parenchyma. Margins: Greater than 0.2 cm to all margins Regional Lymph Nodes: None examined. Breast Prognostic Profile: Case [id number] Estrogen Receptor: 95%, strong Progesterone Receptor: 50%, strong Her2: No amplification was detected. The ratio was 0.81 Ki-67: 30% Best tumor block for sendout testing: 2A Pathologic  Stage Classification (pTNM, AJCC 8th Edition): Primary Tumor (pT): pT2 Regional Lymph Nodes (pN): pNX Distant Metastases (pM): pMX 3. The surgical resection margin(s) of the specimen were inked and microscopically evaluated.  Specimen Gross and Clinical Information Specimen(s) Obtained: 1. Breast, lumpectomy, Left w/ seed 2. Breast, lumpectomy, Right 3. Breast, excision, Right inferior Specimen Clinical Information 1. Bilateral breast cancer (tl) Gross 1. Specimen type: Left breast seed localization lumpectomy, received fresh. The specimen is placed in formalin at 8:50 a.m. on 02/20/2017. Size: 5.2 cm at the anterior-posterior axis, 4.8 cm at the lateral-medial axis and 2.1 cm at the superior-inferior axis. At the anterior surface there is a 3.5 x 1.5 cm portion of skin attached. Orientation: The specimen is oriented with previously applied inks (anterior green, inferior blue, lateral orange, medial yellow, posterior black, superior red). Localized area: There are pins inserted in the specimen identifying the localization seed and biopsy clip. The localization seed is identified at the time of receipt. Cut surface: At the localized area there is a 1.1 x 1.0 x 0.9 cm stellate, indurated white nodule. The remainder of the breast tissue consists of soft yellow adipose tissue and white fibrous tissue. Margins: The nodule is located closest to the superior margin which measures 0.2 cm. The inferior margin measures 0.8 cm. The remaining margins measure greater than 1 cm. Prognostic indicators: Obtain from paraffin blocks if needed. Block summary: Five blocks submitted. A, B, C = entire nodule to include the superior and inferior margins. D = posterior and anterior margin to include skin. E = lateral and medial margins. 2. Specimen type: Right breast lumpectomy, received fresh. The specimen is placed in formalin at  8:35 a.m. on 02/20/2017. Size: 6.2 cm at the anterior-posterior axis, 5.3 cm  at the lateral-medial axis and 3.5 at the superior-inferior axis. There is a 5.2 x 1.8 cm portion of skin present at the anterior surface. Orientation: The specimen is oriented with previously applied inks (anterior green, inferior blue, lateral orange, medial yellow, posterior black, superior red). Localized area: There is a pin inserted in the specimen identifying the biopsy clip. Cut surface: There is a 2.1 x 1.8 x 1.6 cm indurated white nodule which has ill-defined borders. The remainder of the breast tissue consists of soft yellow adipose tissue and white fibrous tissue. Margins: The nodule is located 0.4 cm from the inferior margin and 0.6 cm from the superior margin. The remaining margins measure greater than 1 cm. Prognostic indicators: Obtain from paraffin blocks if needed. Block summary: Six blocks submitted. A, B, C = nodule to include inferior margin. D = nodule to include superior margin. E = posterior and anterior margin to include skin. F = medial and lateral margins. 3. Received fresh and placed in formalin at 8:50 a.m. on 02/20/2017, is a 5.7 x 4.2 x 1.5 cm portion of fibroadipose tissue clinically identified as right inferior margin. The specimen includes a 4.5 x 1.0 cm portion of skin. The inferior margin is previously marked with blue ink. The cut surfaces consist of soft yellow adipose tissue and white fibrous tissue. Tumor is not grossly identified. Sections are submitted in ten cassettes. (GRP:ecj 02/20/2017)  Initial Biopsy 01/07/17 Diagnosis 1. Breast, right, needle core biopsy, 2:00 o'clock - INVASIVE DUCTAL CARCINOMA, SEE COMMENT. 2. Breast, left, needle core biopsy, 2:00 o'clock - INVASIVE MAMMARY CARCINOMA, SEE COMMENT. Microscopic Comment 1. The carcinoma appears grade 3. Prognostic markers will be ordered. Dr. Lyndon Code has reviewed the case. The case was called to [facility] on 01/08/2017. 2. The carcinoma appears grade 2. E-cadherin will be  ordered. Parts #1 and #2 have different morphology. Prognostic markers will be ordered. Dr. Lyndon Code has reviewed the case. The case was called to [facility] on 01/08/2017. 1.Results: IMMUNOHISTOCHEMICAL AND MORPHOMETRIC ANALYSIS PERFORMED MANUALLY Estrogen Receptor: 95%, POSITIVE, STRONG STAINING INTENSITY Progesterone Receptor: 50%, POSITIVE, STRONG STAINING INTENSITY Proliferation Marker Ki67: 30% 1.Results: HER2 - NEGATIVE RATIO OF HER2/CEP17 SIGNALS 0.81 AVERAGE HER2 COPY NUMBER PER CELL 1.70 2.Results: IMMUNOHISTOCHEMICAL AND MORPHOMETRIC ANALYSIS PERFORMED MANUALLY Estrogen Receptor: 100%, POSITIVE, STRONG STAINING INTENSITY Progesterone Receptor: 100%, POSITIVE, STRONG STAINING INTENSITY Proliferation Marker Ki67: 10% 2.Results: HER2 - NEGATIVE RATIO OF HER2/CEP17 SIGNALS 1.75 AVERAGE HER2 COPY NUMBER PER CELL 3.15  RADIOGRAPHIC STUDIES: I have personally reviewed the radiological images as listed and agreed with the findings in the report.   ASSESSMENT & PLAN:  84 y.o.  female   1.  Bilateral breast cancer, invasive ductal carcinoma, right upper inner quadrant, pT2N0M0, stage IA, ER+/PR+/HER2-, G3, left upper-outer quadrant, pT1cN0M0, stage IA, ER+/PR+/HER2-, G2 -I previously discussed her surgical pathology results.  Patient had bilateral stage Ia disease, status post bilateral lumpectomy with negative margins, followed by adjuvant right breast and axillary radiation. -She started adjuvant Anastrozole on 01/2017.  She has been tolerating well, will continue for 5 years. -She has recovered well from radiation, clinically doing very well, exam was unremarkable today, no clinical concern for recurrence. -Lab reviewed, other than her abnormal CBC, no other concerns. -We will continue breast cancer surveillance, with annual mammogram, self-exam, and routine follow-up with lab. -Plan to see her back in 6 months.  She is scheduled  to see survivorship clinic in 3  months.  2. Essential Thrombocytopenia  -Takes Hydrea per Dr. Beryle Beams. -To her leukopenia and mild anemia, Hydrea dose was reduced from 1000 mg daily to 500 mg daily in June 2019. -Platelet count is 721K today, leukopenia and anemia has resolved. -she is also on ASA to reduce risk of thrombosis  -I advised her to increase her Hydrea dose.  I discussed with Dr. Beryle Beams today, will increase her Hydrea to 1 g daily on Mondays, Wednesdays and Fridays, and continue 500 mg daily for the rest of week.  She is scheduled to see Dr. Marcie Mowers in 2 to 3 weeks.   3. Osteopenia  -DEXA from 11/20/14 had a T Score of -2.2 -on vit D and calcium supplement  -Plan to repeat DEXA with mammogram 01/2018   PLAN: -continue anastrozole 1 mg daily -Mammogram and DEXA 01/2018 at [facility] -f/u and labs in 6 months -Survivorship in 12/2017 -adjusted her hydrea dose today and she will follow-up with Dr. Marcie Mowers on September 17   All questions were answered. The patient knows to call the clinic with any problems, questions or concerns. I spent 20 minutes counseling the patient face to face. The total time spent in the appointment was 25 minutes and more than 50% was on counseling.  Dierdre Searles Dweik am acting as scribe for Dr. Truitt Merle.  I have reviewed the above documentation for accuracy and completeness, and I agree with the above.     Truitt Merle, MD 09/09/2017

## 2017-09-08 ENCOUNTER — Other Ambulatory Visit: Payer: Self-pay

## 2017-09-08 DIAGNOSIS — C50211 Malignant neoplasm of upper-inner quadrant of right female breast: Secondary | ICD-10-CM

## 2017-09-08 DIAGNOSIS — Z170 Estrogen receptor positive status [ER+]: Principal | ICD-10-CM

## 2017-09-08 MED ORDER — HYDROXYUREA 500 MG PO CAPS: 500.0000 mg | ORAL_CAPSULE | Freq: Every day | ORAL | 0 refills | Status: DC

## 2017-09-09 ENCOUNTER — Encounter: Payer: Self-pay | Admitting: Hematology

## 2017-09-09 ENCOUNTER — Inpatient Hospital Stay: Payer: Medicare Other

## 2017-09-09 ENCOUNTER — Telehealth: Payer: Self-pay | Admitting: Oncology

## 2017-09-09 ENCOUNTER — Inpatient Hospital Stay: Payer: Medicare Other | Attending: Hematology | Admitting: Hematology

## 2017-09-09 ENCOUNTER — Telehealth: Payer: Self-pay

## 2017-09-09 VITALS — BP 117/65 | HR 74 | Temp 98.4°F | Resp 17 | Ht 63.0 in | Wt 133.2 lb

## 2017-09-09 DIAGNOSIS — Z79811 Long term (current) use of aromatase inhibitors: Secondary | ICD-10-CM

## 2017-09-09 DIAGNOSIS — D693 Immune thrombocytopenic purpura: Secondary | ICD-10-CM

## 2017-09-09 DIAGNOSIS — E2839 Other primary ovarian failure: Secondary | ICD-10-CM

## 2017-09-09 DIAGNOSIS — M8580 Other specified disorders of bone density and structure, unspecified site: Secondary | ICD-10-CM | POA: Diagnosis not present

## 2017-09-09 DIAGNOSIS — Z170 Estrogen receptor positive status [ER+]: Secondary | ICD-10-CM | POA: Diagnosis not present

## 2017-09-09 DIAGNOSIS — D473 Essential (hemorrhagic) thrombocythemia: Secondary | ICD-10-CM

## 2017-09-09 DIAGNOSIS — C50211 Malignant neoplasm of upper-inner quadrant of right female breast: Secondary | ICD-10-CM | POA: Diagnosis not present

## 2017-09-09 DIAGNOSIS — Z7982 Long term (current) use of aspirin: Secondary | ICD-10-CM | POA: Diagnosis not present

## 2017-09-09 DIAGNOSIS — Z79899 Other long term (current) drug therapy: Secondary | ICD-10-CM | POA: Insufficient documentation

## 2017-09-09 DIAGNOSIS — Z923 Personal history of irradiation: Secondary | ICD-10-CM | POA: Diagnosis not present

## 2017-09-09 DIAGNOSIS — C50412 Malignant neoplasm of upper-outer quadrant of left female breast: Secondary | ICD-10-CM

## 2017-09-09 LAB — CBC WITH DIFFERENTIAL (CANCER CENTER ONLY)
BASOS ABS: 0 10*3/uL (ref 0.0–0.1)
Basophils Relative: 1 %
EOS ABS: 0.1 10*3/uL (ref 0.0–0.5)
Eosinophils Relative: 2 %
HCT: 40.2 % (ref 34.8–46.6)
HEMOGLOBIN: 13.4 g/dL (ref 11.6–15.9)
LYMPHS ABS: 0.6 10*3/uL — AB (ref 0.9–3.3)
Lymphocytes Relative: 10 %
MCH: 35.9 pg — AB (ref 25.1–34.0)
MCHC: 33.3 g/dL (ref 31.5–36.0)
MCV: 107.7 fL — ABNORMAL HIGH (ref 79.5–101.0)
Monocytes Absolute: 0.9 10*3/uL (ref 0.1–0.9)
Monocytes Relative: 15 %
NEUTROS PCT: 72 %
Neutro Abs: 4.7 10*3/uL (ref 1.5–6.5)
Platelet Count: 721 10*3/uL — ABNORMAL HIGH (ref 145–400)
RBC: 3.73 MIL/uL (ref 3.70–5.45)
RDW: 14.1 % (ref 11.2–14.5)
WBC: 6.4 10*3/uL (ref 3.9–10.3)

## 2017-09-09 LAB — CMP (CANCER CENTER ONLY)
ALBUMIN: 3.9 g/dL (ref 3.5–5.0)
ALK PHOS: 60 U/L (ref 38–126)
ALT: 27 U/L (ref 0–44)
AST: 20 U/L (ref 15–41)
Anion gap: 8 (ref 5–15)
BILIRUBIN TOTAL: 0.5 mg/dL (ref 0.3–1.2)
BUN: 16 mg/dL (ref 8–23)
CALCIUM: 9.1 mg/dL (ref 8.9–10.3)
CO2: 28 mmol/L (ref 22–32)
CREATININE: 0.82 mg/dL (ref 0.44–1.00)
Chloride: 100 mmol/L (ref 98–111)
GFR, Est AFR Am: >60 mL/min (ref >60)
GLUCOSE: 112 mg/dL — AB (ref 70–99)
POTASSIUM: 3.7 mmol/L (ref 3.5–5.1)
Sodium: 136 mmol/L (ref 135–145)
TOTAL PROTEIN: 6.7 g/dL (ref 6.5–8.1)

## 2017-09-09 LAB — URIC ACID: Uric Acid, Serum: 5 mg/dL (ref 2.5–7.1)

## 2017-09-09 LAB — LACTATE DEHYDROGENASE: LDH: 234 U/L — AB (ref 98–192)

## 2017-09-09 NOTE — Telephone Encounter (Signed)
Pt states she rec'd a denial from the pharmacy for her hydroxyurea (HYDREA) 500 MG capsule medication and would like a call her back as soon as possible.

## 2017-09-09 NOTE — Telephone Encounter (Signed)
Printed avs and calender of upcoming appointment. Per 9/4 los 

## 2017-09-09 NOTE — Telephone Encounter (Signed)
I called pt - no answer; left message to call the office. Also called [facility] - stated rx has been refilled and waiting on her to pick it up.

## 2017-09-22 ENCOUNTER — Other Ambulatory Visit: Payer: Self-pay

## 2017-09-22 ENCOUNTER — Ambulatory Visit: Payer: Medicare Other | Admitting: Oncology

## 2017-09-22 ENCOUNTER — Encounter: Payer: Self-pay | Admitting: Oncology

## 2017-09-22 VITALS — BP 135/68 | HR 74 | Temp 98.1°F | Ht 63.0 in | Wt 132.0 lb

## 2017-09-22 DIAGNOSIS — Z79899 Other long term (current) drug therapy: Secondary | ICD-10-CM

## 2017-09-22 DIAGNOSIS — D649 Anemia, unspecified: Secondary | ICD-10-CM

## 2017-09-22 DIAGNOSIS — Z881 Allergy status to other antibiotic agents status: Secondary | ICD-10-CM

## 2017-09-22 DIAGNOSIS — Z882 Allergy status to sulfonamides status: Secondary | ICD-10-CM

## 2017-09-22 DIAGNOSIS — Z7982 Long term (current) use of aspirin: Secondary | ICD-10-CM

## 2017-09-22 DIAGNOSIS — D759 Disease of blood and blood-forming organs, unspecified: Secondary | ICD-10-CM

## 2017-09-22 DIAGNOSIS — D473 Essential (hemorrhagic) thrombocythemia: Secondary | ICD-10-CM

## 2017-09-22 DIAGNOSIS — Z853 Personal history of malignant neoplasm of breast: Secondary | ICD-10-CM

## 2017-09-22 DIAGNOSIS — Z79890 Hormone replacement therapy (postmenopausal): Secondary | ICD-10-CM

## 2017-09-22 DIAGNOSIS — R011 Cardiac murmur, unspecified: Secondary | ICD-10-CM | POA: Diagnosis not present

## 2017-09-22 DIAGNOSIS — Z923 Personal history of irradiation: Secondary | ICD-10-CM

## 2017-09-22 LAB — CBC WITH DIFFERENTIAL/PLATELET
ABS IMMATURE GRANULOCYTES: 0 10*3/uL (ref 0.0–0.1)
BASOS PCT: 1 %
Basophils Absolute: 0.1 10*3/uL (ref 0.0–0.1)
EOS ABS: 0.1 10*3/uL (ref 0.0–0.7)
Eosinophils Relative: 2 %
HEMATOCRIT: 42.8 % (ref 36.0–46.0)
Hemoglobin: 13.8 g/dL (ref 12.0–15.0)
Immature Granulocytes: 0 %
Lymphocytes Relative: 9 %
Lymphs Abs: 0.5 10*3/uL — ABNORMAL LOW (ref 0.7–4.0)
MCH: 35 pg — AB (ref 26.0–34.0)
MCHC: 32.2 g/dL (ref 30.0–36.0)
MCV: 108.6 fL — ABNORMAL HIGH (ref 78.0–100.0)
MONO ABS: 0.6 10*3/uL (ref 0.1–1.0)
MONOS PCT: 11 %
Neutro Abs: 4.1 10*3/uL (ref 1.7–7.7)
Neutrophils Relative %: 77 %
Platelets: 651 10*3/uL — ABNORMAL HIGH (ref 150–400)
RBC: 3.94 MIL/uL (ref 3.87–5.11)
RDW: 13.4 % (ref 11.5–15.5)
WBC: 5.3 10*3/uL (ref 4.0–10.5)

## 2017-09-22 MED ORDER — HYDROXYUREA 500 MG PO CAPS: ORAL_CAPSULE | ORAL | 0 refills | Status: DC

## 2017-09-22 NOTE — Progress Notes (Signed)
Hematology and Oncology Follow Up Visit  Jill Myers [id number] 03/13/1930 84 y.o. 09/22/2017 3:33 PM   Principle Diagnosis: Encounter Diagnoses  Name Primary?  . Essential thrombocythemia (HCC) Yes  . Disease of blood and blood forming organ   Updated clinical summary: Pleasant 84-year-old woman diagnosed with essential thrombocythemia in April 2005. Blood counts are well controlled on low-dose Hydrea 1000 mg daily and aspirin 81 mg daily. She has never had any thrombotic events. Hydrea controls her counts at the expense of a mild chronic leukopenia and anemia.  In view of progressive leukopenia with fall and total white count to 2300 on June 09, 2017 Hydrea dose was decreased to 1000 mg daily alternating with 500 mg.  Platelet count rose to 721,000 when checked by her oncologist on September 4 and she was put back on a higher maintenance dose of 1000 mg daily alternating with 500 mg daily.    She felt a lump in her right breast in December, 2018.  She had not had a mammogram in about 5 years since January 2013.  Mammogram done on January 01, 2017 showed bilateral breast masses: A 2 cm mass 2 o'clock position right breast and a 0.7 cm mass 2 o'clock position in the left breast.  She underwent ultrasound guided biopsies of both of these masses and they were both found to be invasive ductal cell carcinoma, ER PR positive, HER-2 negative.  She underwent bilateral lumpectomies on February 20, 2017.  No sentinel lymph node dissection.  Left tumor 1.1 cm.  Right tumor 2.1 cm.  Additional areas of atypical lobular hyperplasia.  I do not believe that Oncotype DX testing was done in view of her age.  She received radiation to the right breast completed on May 6.  She started on hormonal therapy with Arimidex  Interim History:   Her oncologist contacted me on September 4.  She was in for routine follow-up on her breast cancer.  CBC showed platelet count up to 721,000.  We adjusted her Hydrea dose currently  1000 mg daily alternating with 500 mg daily.  She is not yet reached steady state.  Platelet count today is 651,000.  I will check a count again in 1 month and make any further adjustments with the goal of keeping her platelets less than 500,000. She reports no new symptoms at this time.  Some decrease in vision which she attributes to her eyeglasses.  No headache.  No diplopia.  No focal weakness.  No cardiorespiratory complaints. She had a nice visit recently from 1 of her granddaughters from [place].  Medications: reviewed  Allergies:  Allergies  Allergen Reactions  . Doxycycline Nausea And Vomiting  . Macrodantin [Nitrofurantoin] Nausea And Vomiting  . Sulfamethoxazole-Trimethoprim Rash    REACTION: Rash after completing course of Septra    Review of Systems: See interim history Remaining ROS negative:   Physical Exam: Blood pressure 135/68, pulse 74, temperature 98.1 F (36.7 C), temperature source Oral, height 5' 3" (1.6 m), weight 132 lb (59.9 kg), SpO2 95 %. Wt Readings from Last 3 Encounters:  09/22/17 132 lb (59.9 kg)  09/09/17 133 lb 3.2 oz (60.4 kg)  08/06/17 132 lb (59.9 kg)     General appearance: Well-nourished Caucasian woman HENNT: Pharynx no erythema, exudate, mass, or ulcer. No thyromegaly or thyroid nodules Lymph nodes: No cervical, supraclavicular, or axillary lymphadenopathy Breasts: Lungs: Clear to auscultation, resonant to percussion throughout Heart: Regular rhythm, 2/6 aortic systolic murmur, no gallop, no rub, no click,   no edema Abdomen: Soft, nontender, normal bowel sounds, no mass, no organomegaly Extremities: No edema, no calf tenderness Musculoskeletal: no joint deformities GU:  Vascular: Carotid pulses 2+, no bruits, distal pulses: Dorsalis pedis 1+ symmetric Neurologic: Alert, oriented, PERRLA, was unable to visualize the optic discs  cranial nerves grossly normal, motor strength 5 over 5, reflexes 1+ symmetric, upper body coordination normal,  gait normal, Skin: No rash or ecchymosis  Lab Results: CBC W/Diff    Component Value Date/Time   WBC 5.3 09/22/2017 1022   RBC 3.94 09/22/2017 1022   HGB 13.8 09/22/2017 1022   HGB 13.4 09/09/2017 1328   HGB 11.7 12/18/2014 1342   HCT 42.8 09/22/2017 1022   HCT 34.7 (L) 12/18/2014 1342   PLT 651 (H) 09/22/2017 1022   PLT 721 (H) 09/09/2017 1328   PLT 340 12/18/2014 1342   MCV 108.6 (H) 09/22/2017 1022   MCV 120.9 (H) 12/18/2014 1342   MCH 35.0 (H) 09/22/2017 1022   MCHC 32.2 09/22/2017 1022   RDW 13.4 09/22/2017 1022   RDW 13.8 12/18/2014 1342   LYMPHSABS 0.5 (L) 09/22/2017 1022   LYMPHSABS 1.3 12/18/2014 1342   MONOABS 0.6 09/22/2017 1022   MONOABS 0.4 12/18/2014 1342   EOSABS 0.1 09/22/2017 1022   EOSABS 0.0 12/18/2014 1342   BASOSABS 0.1 09/22/2017 1022   BASOSABS 0.0 12/18/2014 1342     Chemistry      Component Value Date/Time   NA 136 09/09/2017 1328   NA 138 09/04/2015   NA 138 10/16/2014 1404   K 3.7 09/09/2017 1328   K 3.4 (L) 10/16/2014 1404   CL 100 09/09/2017 1328   CL 100 02/13/2012 1344   CO2 28 09/09/2017 1328   CO2 28 10/16/2014 1404   BUN 16 09/09/2017 1328   BUN 14 09/04/2015   BUN 15.2 10/16/2014 1404   CREATININE 0.82 09/09/2017 1328   CREATININE 0.74 01/22/2017 0715   CREATININE 0.9 10/16/2014 1404   GLU 86 09/04/2015      Component Value Date/Time   CALCIUM 9.1 09/09/2017 1328   CALCIUM 9.0 10/16/2014 1404   ALKPHOS 60 09/09/2017 1328   ALKPHOS 48 10/16/2014 1404   AST 20 09/09/2017 1328   AST 27 10/16/2014 1404   ALT 27 09/09/2017 1328   ALT 34 10/16/2014 1404   BILITOT 0.5 09/09/2017 1328   BILITOT 0.44 10/16/2014 1404       Radiological Studies: No results found.  Impression:  1.  Essential thrombocythemia Readjusting Hydrea dose as noted above.  I will go back to monthly lab checks until stable.  2.  Metachronous clinical stage I and stage II invasive ductal cell carcinoma, ER positive, HER-2 negative, of the left  breast and  right breast treated with lumpectomy, radiation, and now on hormonal therapy with Arimidex.  3.  Aortic stenosis murmur. No cardiac symptoms.  Consider baseline echocardiogram in the near future.  CC: Patient Care Team: Pandey, Mahima, MD as PCP - General (Internal Medicine) Granfortuna, James M, MD as Consulting Physician (Oncology) Stinehelfer, Susan, MD as Consulting Physician (Dermatology) Fields, Karl, MD as Consulting Physician (Sports Medicine) Breen, James O, MD (Inactive) as Consulting Physician (Family Medicine) Toth, Paul III, MD as Consulting Physician (General Surgery) Conterato, Anna, MD as Consulting Physician (Pulmonary Disease) Feng, Yan, MD as Consulting Physician (Hematology) Kinard, James, MD as Consulting Physician (Radiation Oncology)   James Granfortuna, MD, FACP  Hematology-Oncology/Internal Medicine     9/17/20193:33 PM 

## 2017-09-22 NOTE — Patient Instructions (Signed)
Cbc monthly at [facility] & fax results to me at [phone] MD visit 01/25/18. Lab here day of visit

## 2017-09-29 ENCOUNTER — Other Ambulatory Visit: Payer: Self-pay | Admitting: Oncology

## 2017-09-29 DIAGNOSIS — D473 Essential (hemorrhagic) thrombocythemia: Secondary | ICD-10-CM

## 2017-10-05 ENCOUNTER — Ambulatory Visit: Admission: RE | Admit: 2017-10-05 | Payer: Medicare Other | Source: Ambulatory Visit | Admitting: Radiation Oncology

## 2017-10-24 ENCOUNTER — Other Ambulatory Visit: Payer: Self-pay | Admitting: Internal Medicine

## 2017-10-24 DIAGNOSIS — D759 Disease of blood and blood-forming organs, unspecified: Secondary | ICD-10-CM

## 2017-11-02 ENCOUNTER — Other Ambulatory Visit: Payer: Self-pay | Admitting: Nurse Practitioner

## 2017-11-02 DIAGNOSIS — C50211 Malignant neoplasm of upper-inner quadrant of right female breast: Secondary | ICD-10-CM

## 2017-11-02 DIAGNOSIS — Z170 Estrogen receptor positive status [ER+]: Principal | ICD-10-CM

## 2017-12-07 ENCOUNTER — Other Ambulatory Visit: Payer: Self-pay | Admitting: Oncology

## 2017-12-07 ENCOUNTER — Telehealth: Payer: Self-pay | Admitting: *Deleted

## 2017-12-07 MED ORDER — HYDROXYUREA 500 MG PO CAPS: ORAL_CAPSULE | ORAL | 4 refills | Status: DC

## 2017-12-07 NOTE — Telephone Encounter (Signed)
Labs collected 11/19/2017 resulted 818 platelets (high).  Asked by MD to contact patient and confirm her current Hydrea dose.  Dose confirmed by patient at 500 mg daily for the last month.  Per MD, pt will now need to take hydrea 1000mg  on Mon, Wed, Fri and 500mg  other days of week. Pt had already taken today's dose, but MD states she can take an extra pill today.  Pt will then need to have have repeat cbc, diff on 12/17.  Attempted return call to patient, no answer, message left on recorder for return call.Regenia Skeeter, Darlene Cassady12/2/20192:04 PM

## 2017-12-08 NOTE — Telephone Encounter (Signed)
Pt made aware of changes-did not take extra pill yesterday, but will have labs obtained on 12/17 at her the facility she resides. Pt also stated pharmacy did not have new rx-rx sent yesterday, but set to "print" rx phoned into pharmacy, pt aware.[name]12/3/20194:01 PM

## 2017-12-10 DIAGNOSIS — D473 Essential (hemorrhagic) thrombocythemia: Secondary | ICD-10-CM

## 2017-12-10 DIAGNOSIS — I10 Essential (primary) hypertension: Secondary | ICD-10-CM

## 2017-12-10 DIAGNOSIS — G4700 Insomnia, unspecified: Secondary | ICD-10-CM

## 2017-12-10 DIAGNOSIS — E785 Hyperlipidemia, unspecified: Secondary | ICD-10-CM

## 2017-12-10 LAB — CBC WITH DIFFERENTIAL/PLATELET
BASOS ABS: 72 {cells}/uL (ref 0–200)
BASOS PCT: 0.9 %
EOS PCT: 2 %
Eosinophils Absolute: 160 cells/uL (ref 15–500)
HCT: 42.5 % (ref 35.0–45.0)
Hemoglobin: 14.4 g/dL (ref 11.7–15.5)
Lymphs Abs: 848 cells/uL — ABNORMAL LOW (ref 850–3900)
MCH: 33.2 pg — ABNORMAL HIGH (ref 27.0–33.0)
MCHC: 33.9 g/dL (ref 32.0–36.0)
MCV: 97.9 fL (ref 80.0–100.0)
MONOS PCT: 11.7 %
MPV: 9.5 fL (ref 7.5–12.5)
Neutro Abs: 5984 cells/uL (ref 1500–7800)
Neutrophils Relative %: 74.8 %
PLATELETS: 848 10*3/uL — AB (ref 140–400)
RBC: 4.34 10*6/uL (ref 3.80–5.10)
RDW: 14.1 % (ref 11.0–15.0)
TOTAL LYMPHOCYTE: 10.6 %
WBC mixed population: 936 cells/uL (ref 200–950)
WBC: 8 10*3/uL (ref 3.8–10.8)

## 2017-12-10 LAB — COMPLETE METABOLIC PANEL WITH GFR
AG Ratio: 1.7 (calc) (ref 1.0–2.5)
ALKALINE PHOSPHATASE (APISO): 59 U/L (ref 33–130)
ALT: 24 U/L (ref 6–29)
AST: 25 U/L (ref 10–35)
Albumin: 4 g/dL (ref 3.6–5.1)
BILIRUBIN TOTAL: 0.5 mg/dL (ref 0.2–1.2)
BUN: 14 mg/dL (ref 7–25)
CHLORIDE: 99 mmol/L (ref 98–110)
CO2: 29 mmol/L (ref 20–32)
Calcium: 9.4 mg/dL (ref 8.6–10.4)
Creat: 0.74 mg/dL (ref 0.60–0.88)
GFR, Est African American: 86 mL/min/{1.73_m2} (ref >=60)
GFR, Est Non African American: 74 mL/min/{1.73_m2} (ref >=60)
GLUCOSE: 103 mg/dL — AB (ref 65–99)
Globulin: 2.3 g/dL (calc) (ref 1.9–3.7)
Potassium: 4.2 mmol/L (ref 3.5–5.3)
Sodium: 135 mmol/L (ref 135–146)
Total Protein: 6.3 g/dL (ref 6.1–8.1)

## 2017-12-10 LAB — LIPID PANEL
CHOL/HDL RATIO: 3.4 (calc) (ref <5.0)
Cholesterol: 154 mg/dL (ref <200)
HDL: 45 mg/dL — ABNORMAL LOW (ref >50)
LDL CHOLESTEROL (CALC): 87 mg/dL
NON-HDL CHOLESTEROL (CALC): 109 mg/dL (ref <130)
Triglycerides: 122 mg/dL (ref <150)

## 2017-12-10 LAB — TSH: TSH: 3.35 m[IU]/L (ref 0.40–4.50)

## 2017-12-15 ENCOUNTER — Encounter: Payer: Self-pay | Admitting: Internal Medicine

## 2017-12-17 ENCOUNTER — Telehealth: Payer: Self-pay | Admitting: Adult Health

## 2017-12-17 ENCOUNTER — Inpatient Hospital Stay: Payer: Medicare Other | Attending: Hematology | Admitting: Adult Health

## 2017-12-17 ENCOUNTER — Encounter: Payer: Self-pay | Admitting: Adult Health

## 2017-12-17 VITALS — BP 171/79 | HR 71 | Temp 97.0°F | Resp 16 | Ht 63.0 in | Wt 133.7 lb

## 2017-12-17 DIAGNOSIS — M8580 Other specified disorders of bone density and structure, unspecified site: Secondary | ICD-10-CM | POA: Insufficient documentation

## 2017-12-17 DIAGNOSIS — K219 Gastro-esophageal reflux disease without esophagitis: Secondary | ICD-10-CM | POA: Diagnosis not present

## 2017-12-17 DIAGNOSIS — Z7982 Long term (current) use of aspirin: Secondary | ICD-10-CM | POA: Insufficient documentation

## 2017-12-17 DIAGNOSIS — M25552 Pain in left hip: Secondary | ICD-10-CM | POA: Insufficient documentation

## 2017-12-17 DIAGNOSIS — I10 Essential (primary) hypertension: Secondary | ICD-10-CM

## 2017-12-17 DIAGNOSIS — Z79811 Long term (current) use of aromatase inhibitors: Secondary | ICD-10-CM | POA: Insufficient documentation

## 2017-12-17 DIAGNOSIS — Z791 Long term (current) use of non-steroidal anti-inflammatories (NSAID): Secondary | ICD-10-CM | POA: Insufficient documentation

## 2017-12-17 DIAGNOSIS — Z87891 Personal history of nicotine dependence: Secondary | ICD-10-CM | POA: Diagnosis not present

## 2017-12-17 DIAGNOSIS — C50211 Malignant neoplasm of upper-inner quadrant of right female breast: Secondary | ICD-10-CM | POA: Insufficient documentation

## 2017-12-17 DIAGNOSIS — M25569 Pain in unspecified knee: Secondary | ICD-10-CM | POA: Diagnosis not present

## 2017-12-17 DIAGNOSIS — Z170 Estrogen receptor positive status [ER+]: Secondary | ICD-10-CM | POA: Diagnosis not present

## 2017-12-17 DIAGNOSIS — Z923 Personal history of irradiation: Secondary | ICD-10-CM | POA: Diagnosis not present

## 2017-12-17 DIAGNOSIS — Z79899 Other long term (current) drug therapy: Secondary | ICD-10-CM | POA: Insufficient documentation

## 2017-12-17 MED ORDER — ANASTROZOLE 1 MG PO TABS: 1.0000 mg | ORAL_TABLET | Freq: Every day | ORAL | 3 refills | Status: DC

## 2017-12-17 NOTE — Progress Notes (Signed)
CLINIC:  Survivorship   REASON FOR VISIT:  Routine follow-up post-treatment for a recent history of breast cancer.  BRIEF ONCOLOGIC HISTORY:  Oncology History   Cancer Staging Malignant neoplasm of upper-inner quadrant of right breast in female, estrogen receptor positive ([place]) Staging form: Breast, AJCC 8th Edition - Clinical stage from 01/07/2017: Stage IA (cT1c, cN0, cM0, G3, ER: Positive, PR: Positive, HER2: Negative) - Signed by Truitt Merle, MD on 01/20/2017  Malignant neoplasm of upper-outer quadrant of left breast in female, estrogen receptor positive ([place]) Staging form: Breast, AJCC 8th Edition - Clinical stage from 01/07/2017: Stage IA (cT1b, cN0, cM0, G2, ER: Positive, PR: Positive, HER2: Negative) - Signed by Truitt Merle, MD on 01/20/2017       Malignant neoplasm of upper-inner quadrant of right breast in female, estrogen receptor positive ([place])   01/01/2017 Mammogram    IMPRESSION: 1. 2.0 cm mass in the 2 o'clock position of the right breast with imaging features highly suspicious for malignancy. 2. 0.7 cm mass in the 2 o'clock position of the left breast with imaging features highly suspicious for malignancy. 3. No abnormal appearing axillary lymph nodes on either side.    01/07/2017 Receptors her2    Right Breast: Prognostic indicators significant for: ER, 95% positive and PR, 50% positive, both with strong staining intensity. Proliferation marker Ki67 at 30. HER2 negative. RATIO OF HER2/CEP17 SIGNALS 0.81 AVERAGE HER2 COPY NUMBER PER CELL 1.70  Left Breast: Prognostic indicators significant for: ER, 100% positive and PR, 100% positive, both with strong staining intensity. Proliferation marker Ki67 at 10. HER2 negative. RATIO OF HER2/CEP17 SIGNALS 1.75 AVERAGE HER2 COPY NUMBER PER CELL 3.15    01/07/2017 Initial Biopsy    Diagnosis 1. Breast, right, needle core biopsy, 2:00 o'clock - INVASIVE DUCTAL CARCINOMA, SEE COMMENT. 2. Breast, left, needle core biopsy, 2:00  o'clock - INVASIVE MAMMARY CARCINOMA, SEE COMMENT.     01/20/2017 Initial Diagnosis    Malignant neoplasm of upper-inner quadrant of right breast in female, estrogen receptor positive ([place])    01/2017 -  Anti-estrogen oral therapy    Anastrozole 55m daily started on 01/2017    02/20/2017 Surgery    RIGHT BREAST LUMPECTOMY (Right) LEFT BREAST LUMPECTOMY WITH RADIOACTIVE SEED LOCALIZATION (Left)  Per Dr. TMarlou Starks    02/20/2017 Pathology Results     Diagnosis 1. Breast, lumpectomy, Left w/ seed - INVASIVE DUCTAL CARCINOMA, GRADE I/III, SPANNING 1.1 CM. - THE SURGICAL RESECTION MARGINS ARE NEGATIVE FOR CARCINOMA. - SEE ONCOLOGY TABLE BELOW. 2. Breast, lumpectomy, Right - INVASIVE DUCTAL CARCINOMA, GRADE II/III, SPANNING 2.1 CM. - DUCTAL CARCINOMA IN SITU, INTERMEDIATE GRADE. - PERINEURAL INVASION IS IDENTIFIED. - THE SURGICAL RESECTION MARGINS ARE NEGATIVE FOR CARCINOMA. - SEE ONCOLOGY TABLE BELOW. 3. Breast, excision, Right inferior - LOBULAR NEOPLASIA (ATYPICAL LOBULAR HYPERPLASIA). - FAT NECROSIS. - SEE COMMENT.  Microscopic Comment 1. BREAST, INVASIVE TUMOR (LEFT) Procedure: Seed localized lumpectomy Laterality: Left Tumor Size: 1.1 cm (gross measurement). Histologic Type: Ductal Grade: I Tubular Differentiation: 2 Nuclear Pleomorphism: 1 Mitotic Count: 1 Ductal Carcinoma in Situ (DCIS): Not identified Extent of Tumor: Confined to breast parenchyma. Margins: Greater than 0.2 cm to all margins. Regional Lymph Nodes: None examined. Breast Prognostic Profile: Case [id number] Estrogen Receptor: 100%, strong Progesterone Receptor: 100%, strong Her2: No amplification was detected. The ratio was 0.81 Ki-67: 10% Best tumor block for sendout testing: 1B Pathologic Stage Classification (pTNM, AJCC 8th Edition): Primary Tumor (pT): pT1c Regional Lymph Nodes (pN): pNX Distant Metastases (pM): pMX  2.  BREAST, INVASIVE TUMOR (RIGHT) Procedure: Lumpectomy Laterality:  Right Tumor Size: 2.1 cm (gross measurement) Histologic Type: Ductal Grade: II Tubular Differentiation: 3 Nuclear Pleomorphism: 2 Mitotic Count: 1 Ductal Carcinoma in Situ (DCIS): Present, intermediate grade Extent of Tumor: Confined to breast parenchyma. Margins: Greater than 0.2 cm to all margins Regional Lymph Nodes: None examined. Breast Prognostic Profile: Case [id number] Estrogen Receptor: 95%, strong Progesterone Receptor: 50%, strong Her2: No amplification was detected. The ratio was 0.81 Ki-67: 30% Best tumor block for sendout testing: 2A Pathologic Stage Classification (pTNM, AJCC 8th Edition): Primary Tumor (pT): pT2 Regional Lymph Nodes (pN): pNX Distant Metastases (pM): pMX 3. The surgical resection margin(s) of the specimen were inked and microscopically evaluated.    03/31/2017 - 05/11/2017 Radiation Therapy    1. Right Breast / 50 Gy in 25 fractions 2. Right Axilla / 45 Gy in 25 fractions 3. Right Breast Boost / 10 Gy in 5 fractions     Malignant neoplasm of upper-outer quadrant of left breast in female, estrogen receptor positive ([place])   01/20/2017 Initial Diagnosis    Malignant neoplasm of upper-outer quadrant of left breast in female, estrogen receptor positive ([place])    02/20/2017 Surgery    RIGHT BREAST LUMPECTOMY (Right) LEFT BREAST LUMPECTOMY WITH RADIOACTIVE SEED LOCALIZATION (Left)  Per Dr. Marlou Starks       INTERVAL HISTORY:  Jill Myers presents to the [facility] today for our initial meeting to review her survivorship care plan detailing her treatment course for breast cancer, as well as monitoring long-term side effects of that treatment, education regarding health maintenance, screening, and overall wellness and health promotion.     Overall, Jill Myers reports feeling quite well.  Her blood pressure is elevated today.  She denies symptoms such as headache and says her ambulatory BPs are ususally normal.  She wants to know when her bone density  and mammogram is due as she sees it has been ordered to be done on 01/08/18 at the breast center.  She is having some hip and knee pain.  This has been going on for 2-3 weeks.  She says she all of the sudden woke up with it, and it hasn't gotten better.  It is worse after sitting down for extended periods of times and is creaky.  Climbing stairs has become challenging.  She does chair stretching.  She doesn't note any benefits from this.  She wants a 1 year supply of Anastrozole.  She follows with Dr. Beryle Beams for her blood disorder.      REVIEW OF SYSTEMS:  Review of Systems  Constitutional: Negative for appetite change, chills, fatigue, fever and unexpected weight change.  HENT:   Negative for hearing loss.   Eyes: Negative for eye problems and icterus.  Respiratory: Negative for chest tightness, cough and shortness of breath.   Cardiovascular: Negative for chest pain, leg swelling and palpitations.  Gastrointestinal: Negative for abdominal distention, abdominal pain, constipation, diarrhea, nausea and vomiting.  Endocrine: Negative for hot flashes.  Musculoskeletal: Positive for arthralgias.  Skin: Negative for itching and rash.  Neurological: Negative for dizziness, extremity weakness, headaches and numbness.  Hematological: Negative for adenopathy. Does not bruise/bleed easily.  Psychiatric/Behavioral: Negative for depression. The patient is not nervous/anxious.    Breast: Denies any new nodularity, masses, tenderness, nipple changes, or nipple discharge.      ONCOLOGY TREATMENT TEAM:  1. Surgeon:  Dr. Marlou Starks at [facility] 2. Medical Oncologist: Dr. Burr Medico  3. Radiation Oncologist: Dr. Sondra Come  PAST MEDICAL/SURGICAL HISTORY:  Past Medical History:  Diagnosis Date  . Bladder cystocele 05/21/2010  . Complication of anesthesia    deglutition in history  . Deglutition disorder 08/17/2012   Single episode of choking/aspiration; preceded by trouble swallowing saliva  (thin liquids).  For SLP evaluation. Aug 17, 2012.    [name] Disease of blood and blood forming organ 01/05/2007   Qualifier: Diagnosis of  By: Oneida Alar MD, KARL    . Diverticulitis   . DIVERTICULOSIS OF COLON 03/05/2006   Qualifier: Diagnosis of  By: Samara Snide    . DIZZINESS 01/18/2010   Qualifier: Diagnosis of  By: Lindell Noe MD, Jeneen Rinks    . Essential hypertension, benign 02/22/2008   Qualifier: Diagnosis of  By: Lindell Noe MD, Jeneen Rinks     . Essential thrombocythemia ([place]) 01/31/11  . Family history of Alzheimer's disease 11/11/2013   Patient with family history of Alzheimers disease. She herself is very highly functional.  Plans for future visit with MMSE as primary function of the visit.    [name] GERD (gastroesophageal reflux disease) 08/16/2010  . HALLUX VALGUS, ACQUIRED 04/10/2009   Qualifier: Diagnosis of  By: [name] CMA,, [place]    . Hypertension   . HYPERTRIGLYCERIDEMIA 01/05/2007   Qualifier: Diagnosis of  By: Oneida Alar MD, KARL    . Insomnia 02/10/2014  . Left-sided chest wall pain 12/19/2011    L sided chest wall pain follows a dermatomal distribution and raises the possibility of thoracic radiculopathic pain, which would be expected to improve with gabapentin as well. We discussed this at length and she will call to make me aware if this does not get better. Rib belt/girdle in the meantime; may consider rib films or CXR if not improving.     . Metatarsalgia of right foot 11/28/2013   Referral to [facility]   . Myeloma ([place])    prolefica  . Osteopenia   . Restless leg syndrome 06/09/2011  . Restless legs syndrome (RLS)   . URINARY INCONTINENCE, URGE, MILD 11/27/2009   Qualifier: Diagnosis of  By: Zebedee Iba NP, Manuela Schwartz    . Vitamin D deficiency 08/16/2010   Past Surgical History:  Procedure Laterality Date  . BIOPSY BREAST Left 1998   Dr. Rebekah Chesterfield  . BREAST EXCISIONAL BIOPSY    . BREAST LUMPECTOMY Right 02/20/2017   Procedure: RIGHT BREAST LUMPECTOMY;  Surgeon: Jovita Kussmaul, MD;  Location: [place];   Service: General;  Laterality: Right;  . BREAST LUMPECTOMY WITH RADIOACTIVE SEED LOCALIZATION Left 02/20/2017   Procedure: LEFT BREAST LUMPECTOMY WITH RADIOACTIVE SEED LOCALIZATION;  Surgeon: Jovita Kussmaul, MD;  Location: [place];  Service: General;  Laterality: Left;  . CATARACT EXTRACTION  2010   Dr. [name]  . CHOLECYSTECTOMY  1992  . COLONOSCOPY  2008  . MOHS SURGERY  2012   on face [facility]  . NAILBED REPAIR  12/05/2010   Procedure: NAILBED REPAIR;  Surgeon: Cammie Sickle., MD;  Location: [place];  Service: Orthopedics;  Laterality: Right;  full thickness nail biopsy right hallux  . torn tenden  2012   hand Dr. Orie Rout     ALLERGIES:  Allergies  Allergen Reactions  . Doxycycline Nausea And Vomiting  . Macrodantin [Nitrofurantoin] Nausea And Vomiting  . Sulfamethoxazole-Trimethoprim Rash    REACTION: Rash after completing course of Septra     CURRENT MEDICATIONS:  Outpatient Encounter Medications as of 12/17/2017  Medication Sig  . anastrozole (ARIMIDEX) 1 MG tablet Take 1 tablet (  1 mg total) by mouth daily.  [name] aspirin 81 MG tablet Take 81 mg by mouth daily.    . calcium carbonate (OSCAL) 1500 (600 Ca) MG TABS tablet Take 600 mg of elemental calcium by mouth daily.  . cholecalciferol (VITAMIN D) 1000 UNITS tablet Take 1,000 Units by mouth daily.  [name] docusate sodium (COLACE) 250 MG capsule Take 250 mg by mouth daily.  [name] gabapentin (NEURONTIN) 300 MG capsule TAKE ONE CAPSULE BY MOUTH EVERY MORNING. TAKE TWO CAPSULES AT NOON.TAKE 3 CAPSULES EVERY EVENING (Patient taking differently: TAKE ONE CAPSULE BY MOUTH EVERY MORNING AND THREE IN THE EVENING.)  . Glucosamine-Chondroitin 750-600 MG TABS Take 1 tablet by mouth. Take one tablet daily  . hydrochlorothiazide (HYDRODIURIL) 25 MG tablet TAKE 1 TABLET BY MOUTH EVERY DAY  . hydroxyurea (HYDREA) 500 MG capsule Take 2 capsules on Mondays, Wednesdays, & Fridays; Take one  tablet on Tuesdays, Thursdays, Saturdays, & Sundays. May take with food to minimize GI side effects.  [name] ibuprofen (ADVIL,MOTRIN) 200 MG tablet Take 200 mg by mouth as needed.  . Omega-3 Fatty Acids (FISH OIL PO) Take by mouth.  [name] omeprazole (PRILOSEC) 20 MG capsule Take 20 mg by mouth daily.  . polyethylene glycol (MIRALAX / GLYCOLAX) packet Take 17 g by mouth daily.    [name] pyridOXINE (VITAMIN B-6) 100 MG tablet Take 100 mg by mouth daily.  . solifenacin (VESICARE) 10 MG tablet Take 5 mg by mouth daily.   . [DISCONTINUED] anastrozole (ARIMIDEX) 1 MG tablet Take 1 tablet (1 mg total) by mouth daily.   No facility-administered encounter medications on file as of 12/17/2017.      ONCOLOGIC FAMILY HISTORY:  Family History  Problem Relation Age of Onset  . Heart disease Mother   . Tuberculosis Father   . Heart disease Sister   . Heart disease Brother   . Alzheimer's disease Brother   . Alzheimer's disease Brother      GENETIC COUNSELING/TESTING: None at this time  SOCIAL HISTORY:  Social History   Socioeconomic History  . Marital status: Widowed    Spouse name: Not on file  . Number of children: 2  . Years of education: bachelors  . Highest education level: Not on file  Occupational History  . Occupation: Tree surgeon: UNEMPLOYED  . Occupation: Retired- Paramedic  Social Needs  . Financial resource strain: Not on file  . Food insecurity:    Worry: Not on file    Inability: Not on file  . Transportation needs:    Medical: Not on file    Non-medical: Not on file  Tobacco Use  . Smoking status: Former Smoker    Years: 3.00    Last attempt to quit: 1958    Years since quitting: 61.9  . Smokeless tobacco: Never Used  Substance and Sexual Activity  . Alcohol use: Yes    Alcohol/week: 0.0 standard drinks    Comment: Wine rarely.  . Drug use: No  . Sexual activity: Not on file  Lifestyle  . Physical activity:    Days per week: Not on file     Minutes per session: Not on file  . Stress: Not on file  Relationships  . Social connections:    Talks on phone: Not on file    Gets together: Not on file    Attends religious service: Not on file    Active member of club or organization: Not on file    Attends meetings of  clubs or organizations: Not on file    Relationship status: Not on file  . Intimate partner violence:    Fear of current or ex partner: Not on file    Emotionally abused: Not on file    Physically abused: Not on file    Forced sexual activity: Not on file  Other Topics Concern  . Not on file  Social History Narrative   Health Care POA: [name]- Son   Emergency Contact: neighbor, [name] [phone]   End of Life Plan: POA, Living Will   Widow   [facility] since [facility] 2013.   Any pets: none   Diet: Pt has a varied diet of protein, starch, vegetables   Exercise: Pt exercises 3x week for 1 hour with group.   Seatbelts: Pt reports wearing seatbelt when in vehicles.    Sun Exposure/Protection: pt does not wear sun protection   Hobbies: opera, symphony, reading, walking        PHYSICAL EXAMINATION:  Vital Signs:   Vitals:   12/17/17 1004  BP: (!) 171/79  Pulse: 71  Resp: 16  Temp: (!) 97 F (36.1 C)  SpO2: 97%   Filed Weights   12/17/17 1004  Weight: 133 lb 11.2 oz (60.6 kg)   General: Well-nourished, well-appearing female in no acute distress.  She is unaccompanied today.   HEENT: Head is normocephalic.  Pupils equal and reactive to light. Conjunctivae clear without exudate.  Sclerae anicteric. Oral mucosa is pink, moist.  Oropharynx is pink without lesions or erythema.  Lymph: No cervical, supraclavicular, or infraclavicular lymphadenopathy noted on palpation.  Cardiovascular: Regular rate and rhythm.[name] Respiratory: Clear to auscultation bilaterally. Chest expansion symmetric; breathing non-labored.  GI: Abdomen soft and round; non-tender, non-distended. Bowel sounds  normoactive.  GU: Deferred.  Neuro: No focal deficits. Steady gait.  Psych: Mood and affect normal and appropriate for situation.  Extremities: No edema. MSK: No focal spinal tenderness to palpation.  Full range of motion in bilateral upper extremities Skin: Warm and dry.  LABORATORY DATA:  None for this visit.  DIAGNOSTIC IMAGING:  None for this visit.      ASSESSMENT AND PLAN:  Ms.. Myers is a pleasant 84 y.o. female with Stage IA bilateral breast invasive ductal carcinoma, ER+/PR+/HER2-, diagnosed in 01/2017, treated with lumpectomy, adjuvant radiation therapy, and anti-estrogen therapy with Anastrozole beginning in 01/2017.  She presents to the Survivorship Clinic for our initial meeting and routine follow-up post-completion of treatment for breast cancer.    1. Stage IA bilateral breast cancer:  Jill Myers is continuing to recover from definitive treatment for breast cancer. She will follow-up with her medical oncologist, Dr. Burr Medico in 03/2018 with history and physical exam per surveillance protocol.  She will continue her anti-estrogen therapy with Anastrozole. Thus far, she is tolerating the Anastrozole well, with minimal side effects (see #2).  Today, a comprehensive survivorship care plan and treatment summary was reviewed with the patient today detailing her breast cancer diagnosis, treatment course, potential late/long-term effects of treatment, appropriate follow-up care with recommendations for the future, and patient education resources.  A copy of this summary, along with a letter will be sent to the patient's primary care provider via mail/fax/In Basket message after today's visit.    2. Arthralgias: achiness noted in the left hip and knees.  Offered plain films.  Likely arthritis exacerbated by anastrozole. Recommended plain films.  She will talk to her PCP first, and let me know where she wants  these done at.    3. Bone health:  Given Jill Myers's age/history of breast cancer She  has a DEXA ordered for 1/20 that has not yet been scheduled.  My nurse is reaching out to the breast center today.  In the meantime, she was encouraged to increase her consumption of foods rich in calcium, as well as increase her weight-bearing activities.  She was given education on specific activities to promote bone health.  4. Cancer screening:  Due to Jill Myers's history and her age, she should receive screening for skin cancers, colon cancer, and gynecologic cancers.  The information and recommendations are listed on the patient's comprehensive care plan/treatment summary and were reviewed in detail with the patient.    5. Health maintenance and wellness promotion: Jill Myers was encouraged to consume 5-7 servings of fruits and vegetables per day. We reviewed the "Nutrition Rainbow" handout, as well as the handout "Take Control of Your Health and Reduce Your Cancer Risk" from the [employer].  She was also encouraged to engage in moderate to vigorous exercise for 30 minutes per day most days of the week. We discussed the [employer] fitness program, which is designed for cancer survivors to help them become more physically fit after cancer treatments.  She was instructed to limit her alcohol consumption and continue to abstain from tobacco use.     6. Support services/counseling: It is not uncommon for this period of the patient's cancer care trajectory to be one of many emotions and stressors.  We discussed an opportunity for her to participate in the next session of [facility] ("Finding Your New Normal") support group series designed for patients after they have completed treatment.   Jill Myers was encouraged to take advantage of our many other support services programs, support groups, and/or counseling in coping with her new life as a cancer survivor after completing anti-cancer treatment.  She was offered support today through active listening and expressive supportive counseling.  She was  given information regarding our available services and encouraged to contact me with any questions or for help enrolling in any of our support group/programs.    Dispo:   -Return to cancer center in 03/2018 -Mammogram due in 01/2018 -Follow up with surgery--reviewed with patient.  She declines f/u with Dr. Marlou Starks. -She is welcome to return back to the Survivorship Clinic at any time; no additional follow-up needed at this time.  -Consider referral back to survivorship as a long-term survivor for continued surveillance  A total of (30) minutes of face-to-face time was spent with this patient with greater than 50% of that time in counseling and care-coordination.   Gardenia Phlegm, NP Survivorship Program [facility] [phone]   Note: PRIMARY [place] [phone]

## 2017-12-17 NOTE — Telephone Encounter (Signed)
Per 12/11 no los

## 2017-12-18 ENCOUNTER — Telehealth: Payer: Self-pay | Admitting: *Deleted

## 2017-12-18 NOTE — Telephone Encounter (Signed)
Pt to have repeat cbc on 01/05/2018-pt aware.[name]12/13/20194:32 PM

## 2017-12-24 ENCOUNTER — Non-Acute Institutional Stay: Payer: Medicare Other | Admitting: Nurse Practitioner

## 2017-12-24 ENCOUNTER — Encounter: Payer: Self-pay | Admitting: Family

## 2017-12-24 ENCOUNTER — Encounter: Payer: Self-pay | Admitting: Nurse Practitioner

## 2017-12-24 DIAGNOSIS — K219 Gastro-esophageal reflux disease without esophagitis: Secondary | ICD-10-CM | POA: Diagnosis not present

## 2017-12-24 DIAGNOSIS — G2581 Restless legs syndrome: Secondary | ICD-10-CM

## 2017-12-24 DIAGNOSIS — D473 Essential (hemorrhagic) thrombocythemia: Secondary | ICD-10-CM | POA: Diagnosis not present

## 2017-12-24 DIAGNOSIS — I10 Essential (primary) hypertension: Secondary | ICD-10-CM

## 2017-12-24 DIAGNOSIS — K5909 Other constipation: Secondary | ICD-10-CM

## 2017-12-24 DIAGNOSIS — G5763 Lesion of plantar nerve, bilateral lower limbs: Secondary | ICD-10-CM

## 2017-12-24 DIAGNOSIS — C50211 Malignant neoplasm of upper-inner quadrant of right female breast: Secondary | ICD-10-CM

## 2017-12-24 DIAGNOSIS — M150 Primary generalized (osteo)arthritis: Secondary | ICD-10-CM

## 2017-12-24 DIAGNOSIS — Z170 Estrogen receptor positive status [ER+]: Secondary | ICD-10-CM

## 2017-12-24 DIAGNOSIS — M159 Polyosteoarthritis, unspecified: Secondary | ICD-10-CM | POA: Insufficient documentation

## 2017-12-24 DIAGNOSIS — N3941 Urge incontinence: Secondary | ICD-10-CM

## 2017-12-24 NOTE — Patient Instructions (Signed)
F/u in clinic [facility] 6 months

## 2017-12-24 NOTE — Assessment & Plan Note (Signed)
Stable, continue Omeprazole 20mg qd.  

## 2017-12-24 NOTE — Assessment & Plan Note (Signed)
Continue Arimidex 

## 2017-12-24 NOTE — Assessment & Plan Note (Addendum)
Stable. Continue MiraLax qd. Colace 250mg  qd.

## 2017-12-24 NOTE — Assessment & Plan Note (Signed)
Managed, continue Vesicare 5mg  qd .

## 2017-12-24 NOTE — Assessment & Plan Note (Addendum)
Continue gabapentin 100mg  q am, 300mg  qpm.

## 2017-12-24 NOTE — Assessment & Plan Note (Signed)
Right knee, left hip, lower back pain about 1 month, sudden onset, mostly in am, better as day goes. Recently treated for breast cancer, L s/p surgery, R s/p surgery and radiation. Will f/u oncology first.

## 2017-12-24 NOTE — Assessment & Plan Note (Addendum)
Blood pressure is not well controlled, 160-150 mmHg SBP, asymptomatic, continue HCTZ 25mg  qd, keep Bp log.

## 2017-12-24 NOTE — Assessment & Plan Note (Signed)
Stable, continue Gabapentin 300mg  bid, prn Ibuprofen.

## 2017-12-24 NOTE — Assessment & Plan Note (Signed)
plt 848, on Hydrea 500mg  1-2 daily, f/u oncology.

## 2017-12-25 ENCOUNTER — Encounter: Payer: Self-pay | Admitting: Nurse Practitioner

## 2017-12-25 NOTE — Telephone Encounter (Signed)
Late Entry: spoke with patient on Fri Dec 13th and informed her of lab results.  Pt confirmed her current medication dose of hydrea as 1000mg  on Mon, Wed, Fri and 500 mg all other days.  Pt did admit to missing a few doses, but will do better about taking.  Pt will have cbc obtained on 01/05/2018 and have results faxed to [facility].  Called made to [facility] on 12/13 and verbal order was given for cbc.Regenia Skeeter, Katryna Tschirhart Cassady12/20/201910:22 AM

## 2017-12-28 NOTE — Progress Notes (Signed)
Location:   clinic [place]   Place of Service:  Clinic (12) Provider: Marlana Latus NP  Code Status: DNR Goals of Care: [place]  Advanced Directives 09/22/2017  Does Patient Have a Medical Advance Directive? Yes  Type of Advance Directive Living will;Healthcare Power of Attorney  Does patient want to make changes to medical advance directive? No - Patient declined  Copy of [place] in Chart? Yes  Would patient like information on creating a medical advance directive? -     Chief Complaint  Patient presents with  . Medical Management of Chronic Issues    1 year f/u w/labs    HPI: Patient is a 84 y.o. female seen today for medical management of chronic diseases.     The patient has history of HTN, controlled blood pressure on HCTZ 25mg  qd.  GERD, stable on Omeprazole 20mg  qd, chronic constipation, stable on MiraLax qd, Colace 250mg  qd, essential thrombocythemia, not well controlled, on Hydroxyurea 500mg  qd x3, 1000mg  qd x4, f/u oncology, breast cancer, s/p surgery R+L, s/p radiation of the right breast, on Anastrozole 1mg  qd. Chronic foot pain R+L, restless syndrome, on Vit B6 100mg  qd, Gabapentin 100mg  am, 300mg  pm, urinary frequency/occasional incontinence, on Vesicare 5mg  qd, OA multiple sides, on Ibuprofen 200mg  prn.  Past Medical History:  Diagnosis Date  . Bladder cystocele 05/21/2010  . Complication of anesthesia    deglutition in history  . Deglutition disorder 08/17/2012   Single episode of choking/aspiration; preceded by trouble swallowing saliva (thin liquids).  For SLP evaluation. Aug 17, 2012.    [name] Disease of blood and blood forming organ 01/05/2007   Qualifier: Diagnosis of  By: Oneida Alar MD, KARL    . Diverticulitis   . DIVERTICULOSIS OF COLON 03/05/2006   Qualifier: Diagnosis of  By: Samara Snide    . DIZZINESS 01/18/2010   Qualifier: Diagnosis of  By: Lindell Noe MD, Jeneen Rinks    . Essential hypertension, benign 02/22/2008   Qualifier: Diagnosis of  By: Lindell Noe MD, Jeneen Rinks      . Essential thrombocythemia ([place]) 01/31/11  . Family history of Alzheimer's disease 11/11/2013   Patient with family history of Alzheimers disease. She herself is very highly functional.  Plans for future visit with MMSE as primary function of the visit.    [name] GERD (gastroesophageal reflux disease) 08/16/2010  . HALLUX VALGUS, ACQUIRED 04/10/2009   Qualifier: Diagnosis of  By: [name] CMA,, [place]    . Hypertension   . HYPERTRIGLYCERIDEMIA 01/05/2007   Qualifier: Diagnosis of  By: Oneida Alar MD, KARL    . Insomnia 02/10/2014  . Left-sided chest wall pain 12/19/2011    L sided chest wall pain follows a dermatomal distribution and raises the possibility of thoracic radiculopathic pain, which would be expected to improve with gabapentin as well. We discussed this at length and she will call to make me aware if this does not get better. Rib belt/girdle in the meantime; may consider rib films or CXR if not improving.     . Metatarsalgia of right foot 11/28/2013   Referral to [facility]   . Myeloma ([place])    prolefica  . Osteopenia   . Restless leg syndrome 06/09/2011  . Restless legs syndrome (RLS)   . URINARY INCONTINENCE, URGE, MILD 11/27/2009   Qualifier: Diagnosis of  By: Zebedee Iba NP, Manuela Schwartz    . Vitamin D deficiency 08/16/2010    Past Surgical History:  Procedure Laterality Date  . BIOPSY BREAST Left 1998   Dr. Rebekah Chesterfield  .  BREAST EXCISIONAL BIOPSY    . BREAST LUMPECTOMY Right 02/20/2017   Procedure: RIGHT BREAST LUMPECTOMY;  Surgeon: Jovita Kussmaul, MD;  Location: [place];  Service: General;  Laterality: Right;  . BREAST LUMPECTOMY WITH RADIOACTIVE SEED LOCALIZATION Left 02/20/2017   Procedure: LEFT BREAST LUMPECTOMY WITH RADIOACTIVE SEED LOCALIZATION;  Surgeon: Jovita Kussmaul, MD;  Location: [place];  Service: General;  Laterality: Left;  . CATARACT EXTRACTION  2010   Dr. [name]  . CHOLECYSTECTOMY  1992  . COLONOSCOPY  2008  . MOHS SURGERY  2012   on face [facility]  . NAILBED REPAIR  12/05/2010   Procedure: NAILBED REPAIR;  Surgeon: Cammie Sickle., MD;  Location: [place];  Service: Orthopedics;  Laterality: Right;  full thickness nail biopsy right hallux  . torn tenden  2012   hand Dr. Orie Rout    Allergies  Allergen Reactions  . Doxycycline Nausea And Vomiting  . Macrodantin [Nitrofurantoin] Nausea And Vomiting  . Sulfamethoxazole-Trimethoprim Rash    REACTION: Rash after completing course of Septra    Allergies as of 12/24/2017      Reactions   Doxycycline Nausea And Vomiting   Macrodantin [nitrofurantoin] Nausea And Vomiting   Sulfamethoxazole-trimethoprim Rash   REACTION: Rash after completing course of Septra      Medication List       Accurate as of December 24, 2017 11:59 PM. Always use your most recent med list.        anastrozole 1 MG tablet Commonly known as:  ARIMIDEX Take 1 tablet (1 mg total) by mouth daily.   aspirin 81 MG tablet Take 81 mg by mouth daily.   calcium carbonate 1500 (600 Ca) MG Tabs tablet Commonly known as:  OSCAL Take 600 mg of elemental calcium by mouth daily.   cholecalciferol 1000 units tablet Commonly known as:  VITAMIN D Take 1,000 Units by mouth daily.   docusate sodium 250 MG capsule Commonly known as:  COLACE Take 250 mg by mouth daily.   FISH OIL PO Take by mouth.   gabapentin 100 MG capsule Commonly known as:  NEURONTIN Take 100 mg by mouth 3 (three) times daily. Take 1 tablet in the am and 3 tablets in the pm   Glucosamine-Chondroitin 750-600 MG Tabs Take 1 tablet by mouth. Take one tablet daily   hydrochlorothiazide 25 MG tablet Commonly known as:  HYDRODIURIL TAKE 1 TABLET BY MOUTH EVERY DAY   hydroxyurea 500 MG capsule Commonly known as:  HYDREA Take 2 capsules on Mondays, Wednesdays, & Fridays; Take one tablet on Tuesdays, Thursdays, Saturdays, & Sundays. May take with food to minimize GI side effects.   ibuprofen 200 MG  tablet Commonly known as:  ADVIL,MOTRIN Take 200 mg by mouth as needed.   omeprazole 20 MG capsule Commonly known as:  PRILOSEC Take 20 mg by mouth daily.   polyethylene glycol packet Commonly known as:  MIRALAX / GLYCOLAX Take 17 g by mouth daily.   pyridOXINE 100 MG tablet Commonly known as:  VITAMIN B-6 Take 100 mg by mouth daily.   solifenacin 10 MG tablet Commonly known as:  VESICARE Take 5 mg by mouth daily.       Review of Systems:  Review of Systems  Constitutional: Negative for activity change, appetite change, chills, diaphoresis, fatigue, fever and unexpected weight change.  HENT: Positive for hearing loss. Negative for congestion and voice change.   Respiratory: Negative for cough, shortness  of breath and wheezing.   Cardiovascular: Positive for leg swelling. Negative for chest pain and palpitations.  Gastrointestinal: Negative for abdominal distention, abdominal pain, constipation, diarrhea, nausea and vomiting.  Genitourinary: Negative for difficulty urinating, dysuria and urgency.       Occasional incontinent of urine.   Musculoskeletal: Positive for arthralgias, back pain and gait problem. Negative for joint swelling.       Lower back, hip, knee, worse in am, better as day goes.   Skin: Negative for color change and pallor.  Neurological: Negative for dizziness, speech difficulty, weakness and headaches.       Memory lapses.   Psychiatric/Behavioral: Negative for agitation, behavioral problems, hallucinations and sleep disturbance. The patient is not nervous/anxious.     Health Maintenance  Topic Date Due  . TETANUS/TDAP  04/11/2019  . INFLUENZA VACCINE  Completed  . DEXA SCAN  Completed  . PNA vac Low Risk Adult  Completed    Physical Exam: Vitals:   12/24/17 1438  BP: (!) 150/72  Pulse: 82  Resp: 18  Temp: 98.2 F (36.8 C)  SpO2: 95%  Weight: 138 lb 3.2 oz (62.7 kg)  Height: 5\' 3"  (1.6 m)   Body mass index is 24.48 kg/m. Physical  Exam Constitutional:      Appearance: Normal appearance. She is normal weight.  HENT:     Head: Normocephalic and atraumatic.     Nose: Nose normal. No congestion.     Mouth/Throat:     Mouth: Mucous membranes are moist.  Eyes:     Extraocular Movements: Extraocular movements intact.     Pupils: Pupils are equal, round, and reactive to light.  Neck:     Musculoskeletal: Normal range of motion and neck supple.  Cardiovascular:     Rate and Rhythm: Normal rate and regular rhythm.     Heart sounds: No murmur.  Pulmonary:     Effort: Pulmonary effort is normal.     Breath sounds: Normal breath sounds. No wheezing, rhonchi or rales.  Abdominal:     General: There is no distension.     Palpations: Abdomen is soft.     Tenderness: There is no abdominal tenderness. There is no guarding or rebound.  Musculoskeletal:     Left lower leg: Edema present.     Comments: Trace. C/o in lower back, knee, hip in am  Skin:    General: Skin is warm and dry.     Coloration: Skin is not jaundiced or pale.     Findings: No bruising or rash.     Comments: R+L breast surgical scars.   Neurological:     General: No focal deficit present.     Mental Status: She is alert and oriented to person, place, and time. Mental status is at baseline.     Cranial Nerves: No cranial nerve deficit.     Motor: No weakness.     Coordination: Coordination normal.     Gait: Gait abnormal.  Psychiatric:        Mood and Affect: Mood normal.        Behavior: Behavior normal.        Thought Content: Thought content normal.        Judgment: Judgment normal.     Labs reviewed: Basic Metabolic Panel: Recent Labs    06/09/17 1026 09/09/17 1328 12/10/17 0705  NA 132* 136 135  K 3.5 3.7 4.2  CL 95* 100 99  CO2 29 28 29   GLUCOSE 95 112*  103*  BUN 15 16 14   CREATININE 0.86 0.82 0.74  CALCIUM 8.9 9.1 9.4  TSH  --   --  3.35   Liver Function Tests: Recent Labs    01/21/17 1224  06/09/17 1026 09/09/17 1328  12/10/17 0705  AST 22   < > 25 20 25   ALT 26   < > 31 27 24   ALKPHOS 48  --  52 60  --   BILITOT 0.5   < > 0.4 0.5 0.5  PROT 6.6   < > 6.5 6.7 6.3  ALBUMIN 4.0  --  4.0 3.9  --    < > = values in this interval not displayed.   No results for input(s): LIPASE, AMYLASE in the last 8760 hours. No results for input(s): AMMONIA in the last 8760 hours. CBC: Recent Labs    09/09/17 1328 09/22/17 1022 12/10/17 0705  WBC 6.4 5.3 8.0  NEUTROABS 4.7 4.1 5,984  HGB 13.4 13.8 14.4  HCT 40.2 42.8 42.5  MCV 107.7* 108.6* 97.9  PLT 721* 651* 848*   Lipid Panel: Recent Labs    12/10/17 0705  CHOL 154  HDL 45*  LDLCALC 87  TRIG 122  CHOLHDL 3.4   No results found for: HGBA1C  Procedures since last visit: No results found.  Assessment/Plan  Essential hypertension, benign Blood pressure is not well controlled, 160-150 mmHg SBP, asymptomatic, continue HCTZ 25mg  qd, keep Bp log.   GERD (gastroesophageal reflux disease) Stable, continue Omeprazole 20mg  qd.   Chronic constipation Stable. Continue MiraLax qd. Colace 250mg  qd.   Essential thrombocythemia ([place]) plt 848, on Hydrea 500mg  1-2 daily, f/u oncology.   Malignant neoplasm of upper-inner quadrant of right breast in female, estrogen receptor positive (HCC) Continue Arimidex   Morton's neuroma of third interspaces of both feet Stable, continue Gabapentin 300mg  bid, prn Ibuprofen.   Restless leg syndrome Continue gabapentin 100mg  q am, 300mg  qpm.   URINARY INCONTINENCE, URGE, MILD Managed, continue Vesicare 5mg  qd .  Osteoarthritis, multiple sites Right knee, left hip, lower back pain about 1 month, sudden onset, mostly in am, better as day goes. Recently treated for breast cancer, L s/p surgery, R s/p surgery and radiation. Will f/u oncology first.    Labs/tests ordered: none  Next appt:  F/u in clinic [facility] 6 months.

## 2017-12-31 ENCOUNTER — Telehealth: Payer: Self-pay | Admitting: Hematology

## 2017-12-31 ENCOUNTER — Telehealth: Payer: Self-pay

## 2017-12-31 NOTE — Telephone Encounter (Signed)
Patient calls stating she has been have hip and back pain for about a month, the NP where she lives states she needs x-rays, patient wonders if it is coming from Anastrozole.   Her [phone]

## 2017-12-31 NOTE — Telephone Encounter (Signed)
I called her back, she has developed back pain and bilateral hip pain when she changes her position, especially when she stands up from sitting.  The pain does resolve after she walks for a minutes. Her back pain has spontaneously improved, but hip pain has been persistent.  She denies any other new changes.  She did not have arthralgia before.  I think this is probably related to anastrozole, and I recommended her to stop anastrozole, to see if her hip pain improves.  If does not, I recommend her to get a x-ray for further evaluation.  If her hip pain resolves completely, she will call me in 4 to 6 weeks, and I will call in exemestane for her. She voiced good understanding, and agrees with the plan.  Jill Myers  12/31/2017

## 2018-01-07 LAB — CBC AND DIFFERENTIAL
HCT: 44 (ref 36–46)
HEMOGLOBIN: 15 (ref 12.0–16.0)
Platelets: 837 — AB (ref 150–399)
WBC: 8.3

## 2018-01-08 ENCOUNTER — Telehealth: Payer: Self-pay | Admitting: *Deleted

## 2018-01-08 NOTE — Telephone Encounter (Signed)
Pt called / informed "instruct her to increase Hydrea to 2 capsules = 1,000 mg Monday through Friday, only one capsule = 500 mg on Sat & Sundays. Record change on med list. Have her get a repeat CBC on Monday January 20th & fax results to [place]. " per Dr Beryle Beams. Repeated instructions. Stated labs are done on Tues and Thurs. And she wants me to call them @ [phone] to let them know which I will do on Monday.

## 2018-01-08 NOTE — Telephone Encounter (Signed)
-----   Message from Annia Belt, MD sent at 01/08/2018  4:34 PM EST ----- Regarding: RE: Lab results Please call patient: instruct her to increase Hydrea to 2 capsules = 1,000 mg Monday through Friday, only one capsule = 500 mg on Sat & Sundays. Record change on med list. Have her get a repeat CBC on Monday January 20th & fax results to [place]. ----- Message ----- From: Ebbie Latus, RN Sent: 01/08/2018   9:40 AM EST To: Annia Belt, MD Subject: Lab results                                    Dr Darnell Level Fax from [facility] - lab result collected on 01/07/18.WBC - 8.3; Hgb- 15.0; Plt - 837. I will put fax in your box. Suanne Minahan

## 2018-01-11 ENCOUNTER — Other Ambulatory Visit: Payer: Self-pay | Admitting: *Deleted

## 2018-01-11 ENCOUNTER — Encounter: Payer: Self-pay | Admitting: Oncology

## 2018-01-11 NOTE — Telephone Encounter (Signed)
Called Jill Myers at [facility] to request CBC to be drawn on Tues 1/21 per Dr Darnell Level. Stated they need an order faxed to them @ [phone].

## 2018-01-12 NOTE — Telephone Encounter (Signed)
Dr Darnell Level had called yesterday for fax#.

## 2018-02-01 ENCOUNTER — Encounter: Payer: Self-pay | Admitting: *Deleted

## 2018-02-01 ENCOUNTER — Other Ambulatory Visit: Payer: Self-pay | Admitting: *Deleted

## 2018-02-01 MED ORDER — HYDROCHLOROTHIAZIDE 25 MG PO TABS: 25.0000 mg | ORAL_TABLET | Freq: Every day | ORAL | 0 refills | Status: DC

## 2018-02-01 NOTE — Telephone Encounter (Signed)
[facility] 

## 2018-02-02 ENCOUNTER — Encounter: Payer: Self-pay | Admitting: Oncology

## 2018-02-02 ENCOUNTER — Ambulatory Visit: Payer: Medicare Other | Admitting: Oncology

## 2018-02-02 ENCOUNTER — Other Ambulatory Visit: Payer: Self-pay

## 2018-02-02 VITALS — BP 159/83 | HR 78 | Temp 98.2°F | Ht 63.0 in | Wt 130.4 lb

## 2018-02-02 DIAGNOSIS — C50212 Malignant neoplasm of upper-inner quadrant of left female breast: Secondary | ICD-10-CM

## 2018-02-02 DIAGNOSIS — Z881 Allergy status to other antibiotic agents status: Secondary | ICD-10-CM

## 2018-02-02 DIAGNOSIS — D471 Chronic myeloproliferative disease: Secondary | ICD-10-CM

## 2018-02-02 DIAGNOSIS — Z170 Estrogen receptor positive status [ER+]: Secondary | ICD-10-CM

## 2018-02-02 DIAGNOSIS — C50211 Malignant neoplasm of upper-inner quadrant of right female breast: Secondary | ICD-10-CM | POA: Diagnosis not present

## 2018-02-02 DIAGNOSIS — D473 Essential (hemorrhagic) thrombocythemia: Secondary | ICD-10-CM

## 2018-02-02 DIAGNOSIS — Z923 Personal history of irradiation: Secondary | ICD-10-CM

## 2018-02-02 DIAGNOSIS — R011 Cardiac murmur, unspecified: Secondary | ICD-10-CM

## 2018-02-02 DIAGNOSIS — M2550 Pain in unspecified joint: Secondary | ICD-10-CM

## 2018-02-02 NOTE — Patient Instructions (Signed)
To lab today Repeat lab at [facility] in one month unless you see Dr Burr Medico  First   It has been a pleasure working with you!

## 2018-02-02 NOTE — Progress Notes (Signed)
Hematology and Oncology Follow Up Visit  Jill Myers [id number] 1930-10-23 85 y.o. 02/02/2018 2:12 PM   Principle Diagnosis: Encounter Diagnoses  Name Primary?  . Essential thrombocythemia ([place]) Yes  . Malignant neoplasm of upper-inner quadrant of right breast in female, estrogen receptor positive [facility])   Clinical summary: Pleasant 85 year old woman diagnosed with essential thrombocythemia in April 2005. Blood counts were well controlled on low-dose Hydrea 1000 mg daily and aspirin 81 mg daily until 2019 when platelet count broke through.  Dose adjustment currently in progress (January 2020).  She has never had any thrombotic events. Hydrea controls her counts at the expense of a mild chronic leukopenia and anemia.     She felt a lump in her right breast in December, 2018. She had not had a mammogram in about 5 years since January 2013. Mammogram done on January 01, 2017 showed bilateral breast masses: A 2 cm mass 2 o'clock position right breast and a 0.7 cm mass 2 o'clock position in the left breast. She underwent ultrasound guided biopsies of both of these masses and they were both found to be invasive ductal cell carcinoma, ER PR positive, HER-2 negative.She underwent bilateral lumpectomies on February 20, 2017. No sentinel lymph node dissection. Left tumor 1.1 cm. Right tumor 2.1 cm. Additional areas of atypical lobular hyperplasia. I do not believe that Oncotype DX testing was done in view of her age. She received radiation to the right breast completed on May 6. She started on hormonal therapy with Arimidex.  Arimidex currently on hold (January 2020) since she has developed polyarthralgias that are potentially related to the drug.   Interim History: Overall doing well.  She denies any neurologic symptoms.  No severe headache or change in vision.  No focal weakness or dysarthria. She has noted increasing polyarthralgias.  Primarily in the knees and left hip.  Generalized  weakness.  Her oncologist recently advised her to hold her Arimidex.  She has been off the drug for about 1 month.  She is not sure whether it is making a difference at this point.  She is 85 years old and certainly has some underlying degenerative arthritis. No cardiorespiratory complaints.  Medications: reviewed  Allergies:  Allergies  Allergen Reactions  . Doxycycline Nausea And Vomiting  . Macrodantin [Nitrofurantoin] Nausea And Vomiting  . Sulfamethoxazole-Trimethoprim Rash    REACTION: Rash after completing course of Septra    Review of Systems: See interim history Remaining ROS negative:   Physical Exam: Blood pressure (!) 159/83, pulse 78, temperature 98.2 F (36.8 C), temperature source Oral, height '5\' 3"'$  (1.6 m), weight 130 lb 6.4 oz (59.1 kg), SpO2 97 %. Wt Readings from Last 3 Encounters:  02/02/18 130 lb 6.4 oz (59.1 kg)  12/24/17 138 lb 3.2 oz (62.7 kg)  12/17/17 133 lb 11.2 oz (60.6 kg)     General appearance: Petite Caucasian woman HENNT: Pharynx no erythema, exudate, mass, or ulcer. No thyromegaly or thyroid nodules Lymph nodes: No cervical, supraclavicular, or axillary lymphadenopathy Breasts: Not examined today Lungs: Clear to auscultation, resonant to percussion throughout Heart: Regular rhythm, 2/6 aortic stenosis murmur, no gallop, no rub, no click, no edema Abdomen: Soft, nontender, normal bowel sounds, no mass, no organomegaly Extremities: No edema, no calf tenderness Musculoskeletal: no joint deformities GU:  Vascular: Carotid pulses 2+, no bruits, Neurologic: Alert, oriented, PERRLA, optic discs sharp and vessels normal, no hemorrhage or exudate, cranial nerves grossly normal, motor strength 5 over 5, reflexes 1+ symmetric, upper body coordination normal,  gait normal, Skin: No rash or ecchymosis  Lab Results: CBC W/Diff    Component Value Date/Time   WBC 8.3 01/07/2018   WBC 8.0 12/10/2017 0705   RBC 4.34 12/10/2017 0705   HGB 15.0 01/07/2018    HGB 13.4 09/09/2017 1328   HGB 11.7 12/18/2014 1342   HCT 44 01/07/2018   HCT 34.7 (L) 12/18/2014 1342   PLT 837 (A) 01/07/2018   PLT 721 (H) 09/09/2017 1328   PLT 340 12/18/2014 1342   MCV 97.9 12/10/2017 0705   MCV 120.9 (H) 12/18/2014 1342   MCH 33.2 (H) 12/10/2017 0705   MCHC 33.9 12/10/2017 0705   RDW 14.1 12/10/2017 0705   RDW 13.8 12/18/2014 1342   LYMPHSABS 848 (L) 12/10/2017 0705   LYMPHSABS 1.3 12/18/2014 1342   MONOABS 0.6 09/22/2017 1022   MONOABS 0.4 12/18/2014 1342   EOSABS 160 12/10/2017 0705   EOSABS 0.0 12/18/2014 1342   BASOSABS 72 12/10/2017 0705   BASOSABS 0.0 12/18/2014 1342     Chemistry      Component Value Date/Time   NA 135 12/10/2017 0705   NA 138 09/04/2015   NA 138 10/16/2014 1404   K 4.2 12/10/2017 0705   K 3.4 (L) 10/16/2014 1404   CL 99 12/10/2017 0705   CL 100 02/13/2012 1344   CO2 29 12/10/2017 0705   CO2 28 10/16/2014 1404   BUN 14 12/10/2017 0705   BUN 14 09/04/2015   BUN 15.2 10/16/2014 1404   CREATININE 0.74 12/10/2017 0705   CREATININE 0.9 10/16/2014 1404   GLU 86 09/04/2015      Component Value Date/Time   CALCIUM 9.4 12/10/2017 0705   CALCIUM 9.0 10/16/2014 1404   ALKPHOS 60 09/09/2017 1328   ALKPHOS 48 10/16/2014 1404   AST 25 12/10/2017 0705   AST 20 09/09/2017 1328   AST 27 10/16/2014 1404   ALT 24 12/10/2017 0705   ALT 27 09/09/2017 1328   ALT 34 10/16/2014 1404   BILITOT 0.5 12/10/2017 0705   BILITOT 0.5 09/09/2017 1328   BILITOT 0.44 10/16/2014 1404       Radiological Studies: No results found.  Impression:  1.  Myeloproliferative disorder: Essential thrombocythemia Current Hydrea dose 1000 mg Monday through Friday, 500 mg on Saturdays and Sundays.  Dose just changed on January 08, 2018.  Platelet count today trending down at 712,000.  Target less than 500,000.  I will wait another 2 weeks before making a dose adjustment.  My guess is that we will plateau at 1000 mg daily.  2.  Metachronous clinical  stage I and stage II invasive ductal cell carcinoma, ER positive, HER-2 negative, of the left breast and  right breast treated with lumpectomy, radiation, and now on hormonal therapy with Arimidex.  Arimidex temporarily on hold.  See discussion above.  3.  Aortic stenosis murmur. No cardiac symptoms.  Consider baseline echocardiogram in the near future.  CC: Patient Care Team: Mast, [name], NP as PCP - General (Internal Medicine) [name], MD as Consulting Physician (Dermatology) [name], MD as Consulting Physician ([place]) Jovita Kussmaul, MD as Consulting Physician (General Surgery) Raye Sorrow, MD as Consulting Physician (Pulmonary Disease) Truitt Merle, MD as Consulting Physician (Hematology) Gery Pray, MD as Consulting Physician (Radiation Oncology) Delice Bison, Charlestine Massed, NP as Nurse Practitioner (Hematology and Oncology) Annia Belt, MD as Consulting Physician (Oncology)   Murriel Hopper, MD, [place]  Hematology-Oncology/Internal Medicine     1/28/20202:12 PM

## 2018-02-03 ENCOUNTER — Telehealth: Payer: Self-pay | Admitting: *Deleted

## 2018-02-03 LAB — CBC WITH DIFFERENTIAL/PLATELET
BASOS: 1 %
Basophils Absolute: 0.1 10*3/uL (ref 0.0–0.2)
EOS (ABSOLUTE): 0.1 10*3/uL (ref 0.0–0.4)
Eos: 1 %
Hematocrit: 43.7 % (ref 34.0–46.6)
Hemoglobin: 14.9 g/dL (ref 11.1–15.9)
Immature Grans (Abs): 0 10*3/uL (ref 0.0–0.1)
Immature Granulocytes: 0 %
Lymphocytes Absolute: 0.7 10*3/uL (ref 0.7–3.1)
Lymphs: 10 %
MCH: 33.9 pg — AB (ref 26.6–33.0)
MCHC: 34.1 g/dL (ref 31.5–35.7)
MCV: 99 fL — ABNORMAL HIGH (ref 79–97)
MONOS ABS: 0.8 10*3/uL (ref 0.1–0.9)
Monocytes: 12 %
Neutrophils Absolute: 5.2 10*3/uL (ref 1.4–7.0)
Neutrophils: 76 %
Platelets: 712 10*3/uL — ABNORMAL HIGH (ref 150–450)
RBC: 4.4 x10E6/uL (ref 3.77–5.28)
RDW: 14.2 % (ref 11.7–15.4)
WBC: 6.9 10*3/uL (ref 3.4–10.8)

## 2018-02-03 NOTE — Telephone Encounter (Signed)
Pt called / informed "platelet count 712,000. Stay on current dose of Hydrea. repeat CBC again in 2 weeks in our office" per Dr Beryle Beams. Voiced understanding.  Lab appt scheduled Feb 11 @ 1000 AM here at  [facility].

## 2018-02-03 NOTE — Telephone Encounter (Signed)
-----   Message from Annia Belt, MD sent at 02/03/2018  7:53 AM EST ----- Call pt: platelet count 712,000. Stay on current dose of Hydrea. repeat CBC again in 2 weeks in our office

## 2018-02-08 ENCOUNTER — Telehealth: Payer: Self-pay

## 2018-02-08 ENCOUNTER — Telehealth: Payer: Self-pay | Admitting: Hematology

## 2018-02-08 NOTE — Telephone Encounter (Signed)
Patient calls stating that she had quit the anastrozole per Dr. Burr Medico.  She having a lot of body aches and joint pain so unsure if it was coming from the anastrozole. Would like Dr. Ernestina Penna advice.  [phone]

## 2018-02-08 NOTE — Telephone Encounter (Signed)
I called her pack, she has been off anastrozole for months, due to the diffuse joint pain.  She states her back, knee, and hand joint pain remains the same, she takes Tylenol and ibuprofen as needed.  We discussed that it is probably still early to tell if it is related to anastrozole, I recommend her to continue holding off anastrozole, until next month when I see her.  She agrees with the plan.  Truitt Merle 02/08/2018

## 2018-02-12 ENCOUNTER — Ambulatory Visit
Admission: RE | Admit: 2018-02-12 | Discharge: 2018-02-12 | Disposition: A | Discharge status: Home / Self Care | Payer: Medicare Other | Source: Ambulatory Visit | Attending: Hematology | Admitting: Hematology

## 2018-02-12 DIAGNOSIS — Z170 Estrogen receptor positive status [ER+]: Principal | ICD-10-CM

## 2018-02-12 DIAGNOSIS — C50211 Malignant neoplasm of upper-inner quadrant of right female breast: Secondary | ICD-10-CM

## 2018-02-12 DIAGNOSIS — E2839 Other primary ovarian failure: Secondary | ICD-10-CM

## 2018-02-12 HISTORY — DX: Personal history of irradiation: Z92.3

## 2018-02-16 ENCOUNTER — Other Ambulatory Visit (INDEPENDENT_AMBULATORY_CARE_PROVIDER_SITE_OTHER): Payer: Medicare Other

## 2018-02-16 DIAGNOSIS — D473 Essential (hemorrhagic) thrombocythemia: Secondary | ICD-10-CM

## 2018-02-17 LAB — CBC WITH DIFFERENTIAL/PLATELET
Basophils Absolute: 0.1 10*3/uL (ref 0.0–0.2)
Basos: 1 %
EOS (ABSOLUTE): 0.1 10*3/uL (ref 0.0–0.4)
Eos: 2 %
HEMOGLOBIN: 15.3 g/dL (ref 11.1–15.9)
Hematocrit: 45.6 % (ref 34.0–46.6)
IMMATURE GRANULOCYTES: 0 %
Immature Grans (Abs): 0 10*3/uL (ref 0.0–0.1)
Lymphocytes Absolute: 0.7 10*3/uL (ref 0.7–3.1)
Lymphs: 10 %
MCH: 33.6 pg — ABNORMAL HIGH (ref 26.6–33.0)
MCHC: 33.6 g/dL (ref 31.5–35.7)
MCV: 100 fL — ABNORMAL HIGH (ref 79–97)
MONOCYTES: 13 %
Monocytes Absolute: 0.9 10*3/uL (ref 0.1–0.9)
Neutrophils Absolute: 5.1 10*3/uL (ref 1.4–7.0)
Neutrophils: 74 %
Platelets: 683 10*3/uL — ABNORMAL HIGH (ref 150–450)
RBC: 4.55 x10E6/uL (ref 3.77–5.28)
RDW: 14.9 % (ref 11.7–15.4)
WBC: 6.8 10*3/uL (ref 3.4–10.8)

## 2018-02-17 LAB — COMPREHENSIVE METABOLIC PANEL
ALT: 25 IU/L (ref 0–32)
AST: 23 IU/L (ref 0–40)
Albumin/Globulin Ratio: 1.8 (ref 1.2–2.2)
Albumin: 4.2 g/dL (ref 3.6–4.6)
Alkaline Phosphatase: 59 IU/L (ref 39–117)
BUN/Creatinine Ratio: 13 (ref 12–28)
BUN: 11 mg/dL (ref 8–27)
Bilirubin Total: 0.5 mg/dL (ref 0.0–1.2)
CO2: 24 mmol/L (ref 20–29)
Calcium: 9.4 mg/dL (ref 8.7–10.3)
Chloride: 92 mmol/L — ABNORMAL LOW (ref 96–106)
Creatinine, Ser: 0.85 mg/dL (ref 0.57–1.00)
GFR calc Af Amer: 72 mL/min/{1.73_m2} (ref >59)
GFR calc non Af Amer: 63 mL/min/{1.73_m2} (ref >59)
Globulin, Total: 2.3 g/dL (ref 1.5–4.5)
Glucose: 76 mg/dL (ref 65–99)
Potassium: 4.4 mmol/L (ref 3.5–5.2)
Sodium: 134 mmol/L (ref 134–144)
Total Protein: 6.5 g/dL (ref 6.0–8.5)

## 2018-02-17 LAB — URIC ACID: Uric Acid: 4 mg/dL (ref 2.5–7.1)

## 2018-02-17 LAB — LACTATE DEHYDROGENASE: LDH: 207 IU/L (ref 119–226)

## 2018-02-18 ENCOUNTER — Telehealth: Payer: Self-pay | Admitting: *Deleted

## 2018-02-18 DIAGNOSIS — D473 Essential (hemorrhagic) thrombocythemia: Secondary | ICD-10-CM

## 2018-02-18 MED ORDER — HYDROXYUREA 500 MG PO CAPS: 1000.0000 mg | ORAL_CAPSULE | Freq: Every day | ORAL | Status: DC

## 2018-02-18 NOTE — Telephone Encounter (Signed)
-----   Message from Jill Belt, MD sent at 02/17/2018  6:40 AM EST ----- Please call pt: increase Hydrea to 1,000 mg = 2 capsules every day. Repeat CBC in 2 weeks. Please note doe change in chart.

## 2018-02-18 NOTE — Telephone Encounter (Signed)
Pt called / informed she can wait until her appt w/Dr Burr Medico to have labs done per Dr Darnell Level.

## 2018-02-18 NOTE — Telephone Encounter (Addendum)
Pt called / informed "increase Hydrea to 1,000 mg = 2 capsules every day. Repeat CBC in 2 weeks. " per Dr Beryle Beams. Pt verbalized understanding by repeating instruction. Stated she had seen results on My Chart - platelets are 683. She has an appt [facility]ico [facility] and get labs on 3/4 - she wants to know if she needs labs in 2 weeks which will be 2/26 or wait until she geos to the cancer ctr?

## 2018-02-18 NOTE — Telephone Encounter (Signed)
She is OK to wait until appt w Dr Burr Medico.

## 2018-02-26 ENCOUNTER — Telehealth: Payer: Self-pay | Admitting: Hematology

## 2018-02-26 NOTE — Telephone Encounter (Signed)
[place] 3/4 - moved lab/fu to 3/10. Left message. Schedule mailed.

## 2018-03-01 ENCOUNTER — Telehealth: Payer: Self-pay | Admitting: *Deleted

## 2018-03-01 NOTE — Telephone Encounter (Signed)
Call from pt - stated her appt w/ Dr Burr Medico  Has been changed from March 4 to the 10th. She wants to know if she needs to schedule a lab  appt here or if it's ok to wait until her appt on the 10th?

## 2018-03-02 NOTE — Telephone Encounter (Signed)
Pt stated she will come for labs on March 3 @ 1000 AM.

## 2018-03-02 NOTE — Telephone Encounter (Signed)
Would be best if she could come here once more since I just increased her dose

## 2018-03-02 NOTE — Telephone Encounter (Signed)
OK - thanks

## 2018-03-09 ENCOUNTER — Other Ambulatory Visit (INDEPENDENT_AMBULATORY_CARE_PROVIDER_SITE_OTHER): Payer: Medicare Other

## 2018-03-09 DIAGNOSIS — D473 Essential (hemorrhagic) thrombocythemia: Secondary | ICD-10-CM | POA: Diagnosis not present

## 2018-03-10 ENCOUNTER — Telehealth: Payer: Self-pay | Admitting: Hematology

## 2018-03-10 ENCOUNTER — Other Ambulatory Visit: Payer: Medicare Other

## 2018-03-10 ENCOUNTER — Ambulatory Visit: Payer: Medicare Other | Admitting: Hematology

## 2018-03-10 LAB — CBC WITH DIFFERENTIAL/PLATELET
Basophils Absolute: 0 10*3/uL (ref 0.0–0.2)
Basos: 1 %
EOS (ABSOLUTE): 0.1 10*3/uL (ref 0.0–0.4)
EOS: 1 %
HEMATOCRIT: 44.7 % (ref 34.0–46.6)
Hemoglobin: 14.8 g/dL (ref 11.1–15.9)
Immature Grans (Abs): 0 10*3/uL (ref 0.0–0.1)
Immature Granulocytes: 1 %
Lymphocytes Absolute: 0.7 10*3/uL (ref 0.7–3.1)
Lymphs: 11 %
MCH: 33.8 pg — ABNORMAL HIGH (ref 26.6–33.0)
MCHC: 33.1 g/dL (ref 31.5–35.7)
MCV: 102 fL — ABNORMAL HIGH (ref 79–97)
Monocytes Absolute: 0.8 10*3/uL (ref 0.1–0.9)
Monocytes: 14 %
Neutrophils Absolute: 4.6 10*3/uL (ref 1.4–7.0)
Neutrophils: 72 %
Platelets: 594 10*3/uL — ABNORMAL HIGH (ref 150–450)
RBC: 4.38 x10E6/uL (ref 3.77–5.28)
RDW: 16.4 % — ABNORMAL HIGH (ref 11.7–15.4)
WBC: 6.2 10*3/uL (ref 3.4–10.8)

## 2018-03-10 NOTE — Telephone Encounter (Signed)
Patient called to reschedule  °

## 2018-03-11 ENCOUNTER — Telehealth: Payer: Self-pay | Admitting: *Deleted

## 2018-03-11 NOTE — Telephone Encounter (Signed)
Pt called / informed "platelets down to [phone]. Stay on Hydrea 1,000 mg = 2 capsules daily until further advice from Dr Burr Medico " per Dr Beryle Beams. Verbalized understanding.

## 2018-03-11 NOTE — Telephone Encounter (Signed)
-----   Message from Annia Belt, MD sent at 03/10/2018  6:44 AM EST ----- Call pt: platelets down to [phone]. Stay on Hydrea 1,000 mg = 2 capsules daily until further advice from Dr Burr Medico

## 2018-03-12 ENCOUNTER — Other Ambulatory Visit: Payer: Self-pay | Admitting: Oncology

## 2018-03-12 DIAGNOSIS — D473 Essential (hemorrhagic) thrombocythemia: Secondary | ICD-10-CM

## 2018-03-13 ENCOUNTER — Encounter: Payer: Self-pay | Admitting: Oncology

## 2018-03-15 ENCOUNTER — Other Ambulatory Visit: Payer: Self-pay | Admitting: Oncology

## 2018-03-15 DIAGNOSIS — D473 Essential (hemorrhagic) thrombocythemia: Secondary | ICD-10-CM

## 2018-03-15 MED ORDER — HYDROXYUREA 500 MG PO CAPS: 1000.0000 mg | ORAL_CAPSULE | Freq: Every day | ORAL | 3 refills | Status: DC

## 2018-03-16 ENCOUNTER — Ambulatory Visit: Payer: Medicare Other | Admitting: Hematology

## 2018-03-16 ENCOUNTER — Other Ambulatory Visit: Payer: Medicare Other

## 2018-03-24 NOTE — Progress Notes (Signed)
[facility]   [phone] [phone]   Clinic Follow up Note   Patient Care Team: [name], MD as Consulting Physician (Dermatology) [name], MD as Consulting Physician ([place]) Jovita Kussmaul, MD as Consulting Physician (General Surgery) Raye Sorrow, MD as Consulting Physician (Pulmonary Disease) Truitt Merle, MD as Consulting Physician (Hematology) Gery Pray, MD as Consulting Physician (Radiation Oncology) Delice Bison, Charlestine Massed, NP as Nurse Practitioner (Hematology and Oncology) Annia Belt, MD as Consulting Physician (Oncology) 03/25/2018  CHIEF COMPLAINT: Follow up malignant neoplasm of right and left breast and ET  SUMMARY OF ONCOLOGIC HISTORY: Oncology History   Cancer Staging Malignant neoplasm of upper-inner quadrant of right breast in female, estrogen receptor positive ([place]) Staging form: Breast, AJCC 8th Edition - Clinical stage from 01/07/2017: Stage IA (cT1c, cN0, cM0, G3, ER: Positive, PR: Positive, HER2: Negative) - Signed by Truitt Merle, MD on 01/20/2017  Malignant neoplasm of upper-outer quadrant of left breast in female, estrogen receptor positive ([place]) Staging form: Breast, AJCC 8th Edition - Clinical stage from 01/07/2017: Stage IA (cT1b, cN0, cM0, G2, ER: Positive, PR: Positive, HER2: Negative) - Signed by Truitt Merle, MD on 01/20/2017       Malignant neoplasm of upper-inner quadrant of right breast in female, estrogen receptor positive ([place])   01/01/2017 Mammogram    IMPRESSION: 1. 2.0 cm mass in the 2 o'clock position of the right breast with imaging features highly suspicious for malignancy. 2. 0.7 cm mass in the 2 o'clock position of the left breast with imaging features highly suspicious for malignancy. 3. No abnormal appearing axillary lymph nodes on either side.    01/07/2017 Receptors her2    Right Breast: Prognostic indicators significant for: ER, 95% positive and PR, 50% positive,  both with strong staining intensity. Proliferation marker Ki67 at 30. HER2 negative. RATIO OF HER2/CEP17 SIGNALS 0.81 AVERAGE HER2 COPY NUMBER PER CELL 1.70  Left Breast: Prognostic indicators significant for: ER, 100% positive and PR, 100% positive, both with strong staining intensity. Proliferation marker Ki67 at 10. HER2 negative. RATIO OF HER2/CEP17 SIGNALS 1.75 AVERAGE HER2 COPY NUMBER PER CELL 3.15    01/07/2017 Initial Biopsy    Diagnosis 1. Breast, right, needle core biopsy, 2:00 o'clock - INVASIVE DUCTAL CARCINOMA, SEE COMMENT. 2. Breast, left, needle core biopsy, 2:00 o'clock - INVASIVE MAMMARY CARCINOMA, SEE COMMENT.     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 TMarlou Starks    02/20/2017 Pathology Results     Diagnosis 1. Breast, lumpectomy, Left w/ seed - INVASIVE DUCTAL CARCINOMA, GRADE I/III, SPANNING 1.1 CM. - THE SURGICAL RESECTION MARGINS ARE NEGATIVE FOR CARCINOMA. - SEE ONCOLOGY TABLE BELOW. 2. Breast, lumpectomy, Right - INVASIVE DUCTAL CARCINOMA, GRADE II/III, SPANNING 2.1 CM. - DUCTAL CARCINOMA IN SITU, INTERMEDIATE GRADE. - PERINEURAL INVASION IS IDENTIFIED. - THE SURGICAL RESECTION MARGINS ARE NEGATIVE FOR CARCINOMA. - SEE ONCOLOGY TABLE BELOW. 3. Breast, excision, Right inferior - LOBULAR NEOPLASIA (ATYPICAL LOBULAR HYPERPLASIA). - FAT NECROSIS. - SEE COMMENT.  Microscopic Comment 1. BREAST, INVASIVE TUMOR (LEFT) Procedure: Seed localized lumpectomy Laterality: Left Tumor Size: 1.1 cm (gross measurement). Histologic Type: Ductal Grade: I Tubular Differentiation: 2 Nuclear Pleomorphism: 1 Mitotic Count:  1 Ductal Carcinoma in Situ (DCIS): Not  identified Extent of Tumor: Confined to breast parenchyma. Margins: Greater than 0.2 cm to all margins. Regional Lymph Nodes: None examined. Breast Prognostic Profile: Case [id number] Estrogen Receptor: 100%, strong Progesterone Receptor: 100%, strong Her2: No amplification was detected. The ratio was 0.81 Ki-67: 10% Best tumor block for sendout testing: 1B Pathologic Stage Classification (pTNM, AJCC 8th Edition): Primary Tumor (pT): pT1c Regional Lymph Nodes (pN): pNX Distant Metastases (pM): pMX  2. BREAST, INVASIVE TUMOR (RIGHT) Procedure: Lumpectomy Laterality: Right Tumor Size: 2.1 cm (gross measurement) Histologic Type: Ductal Grade: II Tubular Differentiation: 3 Nuclear Pleomorphism: 2 Mitotic Count: 1 Ductal Carcinoma in Situ (DCIS): Present, intermediate grade Extent of Tumor: Confined to breast parenchyma. Margins: Greater than 0.2 cm to all margins Regional Lymph Nodes: None examined. Breast Prognostic Profile: Case [id number] Estrogen Receptor: 95%, strong Progesterone Receptor: 50%, strong Her2: No amplification was detected. The ratio was 0.81 Ki-67: 30% Best tumor block for sendout testing: 2A Pathologic Stage Classification (pTNM, AJCC 8th Edition): Primary Tumor (pT): pT2 Regional Lymph Nodes (pN): pNX Distant Metastases (pM): pMX 3. The surgical resection margin(s) of the specimen were inked and microscopically evaluated.    03/31/2017 - 05/11/2017 Radiation Therapy    1. Right Breast / 50 Gy in 25 fractions 2. Right Axilla / 45 Gy in 25 fractions 3. Right Breast Boost / 10 Gy in 5 fractions     Malignant neoplasm of upper-outer quadrant of left breast in female, estrogen receptor positive ([place])   01/20/2017 Initial Diagnosis    Malignant neoplasm of upper-outer quadrant of left breast in female, estrogen receptor positive ([place])    02/20/2017 Surgery    RIGHT BREAST LUMPECTOMY (Right) LEFT BREAST LUMPECTOMY WITH RADIOACTIVE SEED LOCALIZATION  (Left)  Per Dr. Marlou Starks      02/12/2018 Mammogram    IMPRESSION: No mammographic evidence of breast malignancy      CURRENT THERAPY  -Anastrozole 1 mg daily started on 01/2017, stopped around 01/2018  due to diffuse joint pain, will switch to Exemestane on 03/26/2018 -Hydrea '1000mg'$  daily, dose increased in Jan 2020    INTERVAL HISTORY: HORTENCIA MARTIRE is a 85 y.o. female who is here for follow-up. She had a mammogram on 02/12/2018 and it showed no evidence of breast malignancy. Additionally, she recently stopped Anastrozole due to worsening diffuse joint pain.  Today, she is here alone and is doing well. Her diffuse joint pain is now resolved. She did not have any other complications due to anastrozole.   Pertinent positives and negatives of review of systems are listed and detailed within the above HPI.  REVIEW OF SYSTEMS:  Constitutional: Denies fevers, chills or abnormal weight loss Eyes: Denies blurriness of vision Ears, nose, mouth, throat, and face: Denies mucositis or sore throat Respiratory: Denies cough, dyspnea or wheezes Cardiovascular: Denies palpitation, chest discomfort or lower extremity swelling Gastrointestinal:  Denies nausea, heartburn or change in bowel habits Skin: Denies abnormal skin rashes Lymphatics: Denies new lymphadenopathy or easy bruising Neurological:Denies numbness, tingling or new weaknesses Behavioral/Psych: Mood is stable, no new changes  All other systems were reviewed with the patient and are negative.  MEDICAL HISTORY:  Past Medical History:  Diagnosis Date  . Bladder cystocele 05/21/2010  . Complication of anesthesia    deglutition in history  . Deglutition disorder 08/17/2012   Single episode of choking/aspiration; preceded by trouble swallowing saliva (thin liquids).  For SLP evaluation. Aug 17, 2012.    [name] Disease of blood and blood  forming organ 01/05/2007   Qualifier: Diagnosis of  By: Oneida Alar MD, KARL    . Diverticulitis   . DIVERTICULOSIS OF  COLON 03/05/2006   Qualifier: Diagnosis of  By: Samara Snide    . DIZZINESS 01/18/2010   Qualifier: Diagnosis of  By: Lindell Noe MD, Jeneen Rinks    . Essential hypertension, benign 02/22/2008   Qualifier: Diagnosis of  By: Lindell Noe MD, Jeneen Rinks     . Essential thrombocythemia ([place]) 01/31/11  . Family history of Alzheimer's disease 11/11/2013   Patient with family history of Alzheimers disease. She herself is very highly functional.  Plans for future visit with MMSE as primary function of the visit.    [name] GERD (gastroesophageal reflux disease) 08/16/2010  . HALLUX VALGUS, ACQUIRED 04/10/2009   Qualifier: Diagnosis of  By: [name] CMA,, [place]    . Hypertension   . HYPERTRIGLYCERIDEMIA 01/05/2007   Qualifier: Diagnosis of  By: Oneida Alar MD, KARL    . Insomnia 02/10/2014  . Left-sided chest wall pain 12/19/2011    L sided chest wall pain follows a dermatomal distribution and raises the possibility of thoracic radiculopathic pain, which would be expected to improve with gabapentin as well. We discussed this at length and she will call to make me aware if this does not get better. Rib belt/girdle in the meantime; may consider rib films or CXR if not improving.     . Metatarsalgia of right foot 11/28/2013   Referral to [facility]   . Osteopenia   . Personal history of radiation therapy    03/2017 right breast  . Restless leg syndrome 06/09/2011  . Restless legs syndrome (RLS)   . URINARY INCONTINENCE, URGE, MILD 11/27/2009   Qualifier: Diagnosis of  By: Zebedee Iba NP, Manuela Schwartz    . Vitamin D deficiency 08/16/2010    SURGICAL HISTORY: Past Surgical History:  Procedure Laterality Date  . BIOPSY BREAST Left 1998   Dr. Rebekah Chesterfield  . BREAST EXCISIONAL BIOPSY    . BREAST LUMPECTOMY Right 02/20/2017   Procedure: RIGHT BREAST LUMPECTOMY;  Surgeon: Jovita Kussmaul, MD;  Location: [facility];  Service: General;  Laterality: Right;  . BREAST LUMPECTOMY Left 02/20/2017   left lumpectomy  . BREAST LUMPECTOMY WITH RADIOACTIVE SEED  LOCALIZATION Left 02/20/2017   Procedure: LEFT BREAST LUMPECTOMY WITH RADIOACTIVE SEED LOCALIZATION;  Surgeon: Jovita Kussmaul, MD;  Location: [facility];  Service: General;  Laterality: Left;  . CATARACT EXTRACTION  2010   Dr. [name]  . CHOLECYSTECTOMY  1992  . COLONOSCOPY  2008  . MOHS SURGERY  2012   on face [facility] [place]  . NAILBED REPAIR  12/05/2010   Procedure: NAILBED REPAIR;  Surgeon: Cammie Sickle., MD;  Location: [facility];  Service: Orthopedics;  Laterality: Right;  full thickness nail biopsy right hallux  . torn tenden  2012   hand Dr. Orie Rout    I have reviewed the social history and family history with the patient and they are unchanged from previous note.  ALLERGIES:  is allergic to doxycycline; macrodantin [nitrofurantoin]; and sulfamethoxazole-trimethoprim.  MEDICATIONS:  Current Outpatient Medications  Medication Sig Dispense Refill  . aspirin 81 MG tablet Take 81 mg by mouth daily.      . calcium carbonate (OSCAL) 1500 (600 Ca) MG TABS tablet Take 600 mg of elemental calcium by mouth daily.    . cholecalciferol (VITAMIN D) 1000 UNITS tablet Take 1,000 Units by mouth daily.    [name] docusate sodium (COLACE) 250 MG  capsule Take 250 mg by mouth daily.    [name] gabapentin (NEURONTIN) 100 MG capsule Take 100 mg by mouth 2 (two) times daily. Take 1 tablet in the am and 3 tablets in the pm     . Glucosamine-Chondroitin 750-600 MG TABS Take 1 tablet by mouth. Take one tablet daily    . hydrochlorothiazide (HYDRODIURIL) 25 MG tablet Take 1 tablet (25 mg total) by mouth daily. 90 tablet 0  . hydroxyurea (HYDREA) 500 MG capsule Take 2 capsules (1,000 mg total) by mouth daily. May take with food to minimize GI side effects. 60 capsule 3  . ibuprofen (ADVIL,MOTRIN) 200 MG tablet Take 200 mg by mouth as needed.    . Omega-3 Fatty Acids (FISH OIL PO) Take by mouth.    [name] omeprazole (PRILOSEC) 20 MG capsule Take 20 mg by mouth daily.    .  polyethylene glycol (MIRALAX / GLYCOLAX) packet Take 17 g by mouth daily.      [name] pyridOXINE (VITAMIN B-6) 100 MG tablet Take 100 mg by mouth daily.    . solifenacin (VESICARE) 10 MG tablet Take 5 mg by mouth daily.     [name] anastrozole (ARIMIDEX) 1 MG tablet Take 1 tablet (1 mg total) by mouth daily. (Patient not taking: Reported on 03/25/2018) 90 tablet 3  . exemestane (AROMASIN) 25 MG tablet Take 1 tablet (25 mg total) by mouth daily after breakfast. 30 tablet 3   No current facility-administered medications for this visit.     PHYSICAL EXAMINATION: ECOG PERFORMANCE STATUS: 1 - Symptomatic but completely ambulatory  Vitals:   03/25/18 0956  BP: (!) 149/79  Pulse: 71  Resp: 18  Temp: 98.2 F (36.8 C)  SpO2: 95%   Filed Weights   03/25/18 0956  Weight: 127 lb 4.8 oz (57.7 kg)    GENERAL:alert, no distress and comfortable SKIN: skin color, texture, turgor are normal, no rashes or significant lesions EYES: normal, Conjunctiva are pink and non-injected, sclera clear OROPHARYNX:no exudate, no erythema and lips, buccal mucosa, and tongue normal  NECK: supple, thyroid normal size, non-tender, without nodularity LYMPH:  no palpable lymphadenopathy in the cervical, axillary or inguinal LUNGS: clear to auscultation and percussion with normal breathing effort HEART: regular rate & rhythm and no murmurs and no lower extremity edema ABDOMEN:abdomen soft, non-tender and normal bowel sounds Musculoskeletal:no cyanosis of digits and no clubbing  NEURO: alert & oriented x 3 with fluent speech, no focal motor/sensory deficits  LABORATORY DATA:  I have reviewed the data as listed CBC Latest Ref Rng & Units 03/25/2018 03/09/2018 02/16/2018  WBC 4.0 - 10.5 K/uL 4.5 6.2 6.8  Hemoglobin 12.0 - 15.0 g/dL 15.1(H) 14.8 15.3  Hematocrit 36.0 - 46.0 % 45.0 44.7 45.6  Platelets 150 - 400 K/uL 459(H) 594(H) 683(H)     CMP Latest Ref Rng & Units 03/25/2018 02/16/2018 12/10/2017  Glucose 70 - 99 mg/dL 93 76  103(H)  BUN 8 - 23 mg/dL 15 11 14   Creatinine 0.44 - 1.00 mg/dL 0.78 0.85 0.74  Sodium 135 - 145 mmol/L 137 134 135  Potassium 3.5 - 5.1 mmol/L 3.8 4.4 4.2  Chloride 98 - 111 mmol/L 100 92(L) 99  CO2 22 - 32 mmol/L 28 24 29   Calcium 8.9 - 10.3 mg/dL 8.7(L) 9.4 9.4  Total Protein 6.5 - 8.1 g/dL 7.0 6.5 6.3  Total Bilirubin 0.3 - 1.2 mg/dL 0.7 0.5 0.5  Alkaline Phos 38 - 126 U/L 62 59 -  AST 15 - 41 U/L 26  23 25  ALT 0 - 44 U/L 33 25 24      RADIOGRAPHIC STUDIES: I have personally reviewed the radiological images as listed and agreed with the findings in the report.  02/12/2018 Mammogram IMPRESSION: No mammographic evidence of breast malignancy   ASSESSMENT & PLAN:  DIA DONATE is a 85 y.o. female with history of  1.  Bilateral breast cancer, invasive ductal carcinoma, right upper inner quadrant, pT2N0M0, stage IA, ER+/PR+/HER2-, G3, left upper-outer quadrant, pT1cN0M0, stage IA, ER+/PR+/HER2-, G2 - She was diagnosed on 01/20/2017, and is s/p  bilateral lumpectomy with negative margins, followed by adjuvant right breast and axillary radiation. -She started adjuvant Anastrozole on 01/2017 but held around 01/2018 due to bilateral hip pain. Her joint pain is now resolved  -We again reviewed her risk of recurrence, and other antiestrogen therapy options.  She is overall in good health, her mother lived to 52 year old.  I recommend her to consider exemestane.  Due to her risk of thrombosis from ED, tamoxifen is a not a good option for her.  We again reviewed the potential side effects and the benefit, she agrees to try. - She had a mammogram on 02/12/2018 and it showed no evidence of breast malignancy  --Lab reviewed,Hg 15.1, MCV 104.4, MCH 35, RDW 16.6, Platelet Count 459, Lymphs Abs 0.6  - She is clinically doing well, no concerns for recurrence.   Will continue continue breast cancer surveillance, with annual mammogram, self-exam, and routine follow-up with lab. -f/u in 4 months   2.  Bilateral hip pain, Diffuse Joint pain  - She has had bilateral hip pain since 11/2017 but it started to diffuse  -Her joint pain has gradually resolved since she stopped anastrozole in January 2020. -I encouraged her to continue exercise.  She is physically active.  3. Essential thrombocythemia -Takes Hydrea per Dr. Beryle Beams. - On Hydrea dose currently 1000 mg = 2 capsules daily  - She is on ASA to reduce risk of thrombosis  -We will continue Hydrea 1000 mg daily.  Dr. Beryle Beams will retire this month, I will manage her ET.  4. Osteopenia  -on vit D and calcium supplement  - She had a DEXA on 02/12/2018 and it showed a T-score of -2.2 there was no change from 2016 DEXA - I reviewed with her in detail her DEXA from 02/12/2018 and we discussed treatment options to strengthen her bones with biphosphonate and prevent hip fracture. She will start Zoledronic acid (Zometa) pill and infusion.  Potential benefits and side effects, especially risk of jaw necrosis, were discussed with patient in details.  She agrees to start.  We start  infusion next visit  - I encouraged her to continue her calcium and vitD supplement, she will increase from 1 tablet daily to 2 tablets daily  5. HTN, GERD -on HCTZ, Omeprazole  - F/u PCP    6. Morton's neuroma of third interspaces of both feet, Restless leg syndrome - Stable, continue Gabapentin '300mg'$  bid, prn Ibuprofen.    PLAN: - She will start Exemestane  -she will start Zoledronic Acid (Zometa) on next visit  - Lab, f/u and zometa infusion in 4 months  - Lab in 2 month for CBC     No problem-specific Assessment & Plan notes found for this encounter.   No orders of the defined types were placed in this encounter.  All questions were answered. The patient knows to call the clinic with any problems, questions or concerns. No barriers to learning  was detected. I spent 20 minutes counseling the patient face to face. The total time spent in the  appointment was 25 minutes and more than 50% was on counseling and review of test results  I, Manson Allan am acting as scribe for Dr. Truitt Merle.  I have reviewed the above documentation for accuracy and completeness, and I agree with the above.     Truitt Merle, MD 03/25/2018

## 2018-03-25 ENCOUNTER — Encounter: Payer: Self-pay | Admitting: Hematology

## 2018-03-25 ENCOUNTER — Inpatient Hospital Stay (HOSPITAL_BASED_OUTPATIENT_CLINIC_OR_DEPARTMENT_OTHER): Payer: Medicare Other | Admitting: Hematology

## 2018-03-25 ENCOUNTER — Inpatient Hospital Stay: Payer: Medicare Other | Attending: Hematology

## 2018-03-25 ENCOUNTER — Other Ambulatory Visit: Payer: Self-pay

## 2018-03-25 ENCOUNTER — Telehealth: Payer: Self-pay | Admitting: Hematology

## 2018-03-25 VITALS — BP 149/79 | HR 71 | Temp 98.2°F | Resp 18 | Ht 63.0 in | Wt 127.3 lb

## 2018-03-25 DIAGNOSIS — Z791 Long term (current) use of non-steroidal anti-inflammatories (NSAID): Secondary | ICD-10-CM

## 2018-03-25 DIAGNOSIS — Z79899 Other long term (current) drug therapy: Secondary | ICD-10-CM

## 2018-03-25 DIAGNOSIS — G2581 Restless legs syndrome: Secondary | ICD-10-CM | POA: Diagnosis not present

## 2018-03-25 DIAGNOSIS — M25551 Pain in right hip: Secondary | ICD-10-CM

## 2018-03-25 DIAGNOSIS — K219 Gastro-esophageal reflux disease without esophagitis: Secondary | ICD-10-CM | POA: Diagnosis not present

## 2018-03-25 DIAGNOSIS — M25552 Pain in left hip: Secondary | ICD-10-CM | POA: Insufficient documentation

## 2018-03-25 DIAGNOSIS — M8580 Other specified disorders of bone density and structure, unspecified site: Secondary | ICD-10-CM | POA: Diagnosis not present

## 2018-03-25 DIAGNOSIS — I10 Essential (primary) hypertension: Secondary | ICD-10-CM

## 2018-03-25 DIAGNOSIS — C50211 Malignant neoplasm of upper-inner quadrant of right female breast: Secondary | ICD-10-CM

## 2018-03-25 DIAGNOSIS — Z170 Estrogen receptor positive status [ER+]: Secondary | ICD-10-CM | POA: Insufficient documentation

## 2018-03-25 DIAGNOSIS — G5760 Lesion of plantar nerve, unspecified lower limb: Secondary | ICD-10-CM

## 2018-03-25 DIAGNOSIS — D473 Essential (hemorrhagic) thrombocythemia: Secondary | ICD-10-CM | POA: Diagnosis not present

## 2018-03-25 DIAGNOSIS — Z7982 Long term (current) use of aspirin: Secondary | ICD-10-CM | POA: Diagnosis not present

## 2018-03-25 DIAGNOSIS — C50412 Malignant neoplasm of upper-outer quadrant of left female breast: Secondary | ICD-10-CM

## 2018-03-25 DIAGNOSIS — Z79811 Long term (current) use of aromatase inhibitors: Secondary | ICD-10-CM | POA: Diagnosis not present

## 2018-03-25 DIAGNOSIS — Z923 Personal history of irradiation: Secondary | ICD-10-CM | POA: Insufficient documentation

## 2018-03-25 LAB — CBC WITH DIFFERENTIAL (CANCER CENTER ONLY)
Abs Immature Granulocytes: 0.02 10*3/uL (ref 0.00–0.07)
Basophils Absolute: 0 10*3/uL (ref 0.0–0.1)
Basophils Relative: 1 %
Eosinophils Absolute: 0.1 10*3/uL (ref 0.0–0.5)
Eosinophils Relative: 2 %
HCT: 45 % (ref 36.0–46.0)
Hemoglobin: 15.1 g/dL — ABNORMAL HIGH (ref 12.0–15.0)
Immature Granulocytes: 0 %
Lymphocytes Relative: 14 %
Lymphs Abs: 0.6 10*3/uL — ABNORMAL LOW (ref 0.7–4.0)
MCH: 35 pg — ABNORMAL HIGH (ref 26.0–34.0)
MCHC: 33.6 g/dL (ref 30.0–36.0)
MCV: 104.4 fL — ABNORMAL HIGH (ref 80.0–100.0)
MONOS PCT: 18 %
Monocytes Absolute: 0.8 10*3/uL (ref 0.1–1.0)
Neutro Abs: 2.9 10*3/uL (ref 1.7–7.7)
Neutrophils Relative %: 65 %
Platelet Count: 459 10*3/uL — ABNORMAL HIGH (ref 150–400)
RBC: 4.31 MIL/uL (ref 3.87–5.11)
RDW: 16.6 % — ABNORMAL HIGH (ref 11.5–15.5)
WBC Count: 4.5 10*3/uL (ref 4.0–10.5)
nRBC: 0 % (ref 0.0–0.2)

## 2018-03-25 LAB — CMP (CANCER CENTER ONLY)
ALT: 33 U/L (ref 0–44)
AST: 26 U/L (ref 15–41)
Albumin: 4 g/dL (ref 3.5–5.0)
Alkaline Phosphatase: 62 U/L (ref 38–126)
Anion gap: 9 (ref 5–15)
BUN: 15 mg/dL (ref 8–23)
CO2: 28 mmol/L (ref 22–32)
CREATININE: 0.78 mg/dL (ref 0.44–1.00)
Calcium: 8.7 mg/dL — ABNORMAL LOW (ref 8.9–10.3)
Chloride: 100 mmol/L (ref 98–111)
GFR, Est AFR Am: >60 mL/min (ref >60)
GFR, Estimated: >60 mL/min (ref >60)
Glucose, Bld: 93 mg/dL (ref 70–99)
Potassium: 3.8 mmol/L (ref 3.5–5.1)
Sodium: 137 mmol/L (ref 135–145)
Total Bilirubin: 0.7 mg/dL (ref 0.3–1.2)
Total Protein: 7 g/dL (ref 6.5–8.1)

## 2018-03-25 MED ORDER — EXEMESTANE 25 MG PO TABS: 25.0000 mg | ORAL_TABLET | Freq: Every day | ORAL | 3 refills | Status: DC

## 2018-03-25 NOTE — Telephone Encounter (Signed)
Scheduled appts per 03/19 los °Patient received printed avs and calender.  °

## 2018-04-12 ENCOUNTER — Telehealth: Payer: Self-pay

## 2018-04-12 NOTE — Telephone Encounter (Signed)
Left voice message for patient regarding Exemestane.  Per Dr. Burr Medico stop for now, will add f/u appointment on 5/14, she is already scheduled for lab to discuss her medications.  Scheduling will call her with a time.

## 2018-04-12 NOTE — Telephone Encounter (Signed)
Patient calls stating that she has been taking Exemestane and is experiencing a great deal of discomfort in upper thighs and gluts.  She wants to know if she should continue taking it?  [phone]   Message placed on Dr. Ernestina Penna desk for review.

## 2018-04-13 ENCOUNTER — Other Ambulatory Visit: Payer: Self-pay | Admitting: *Deleted

## 2018-04-13 DIAGNOSIS — K219 Gastro-esophageal reflux disease without esophagitis: Secondary | ICD-10-CM

## 2018-04-13 MED ORDER — OMEPRAZOLE 20 MG PO CPDR: 20.0000 mg | DELAYED_RELEASE_CAPSULE | Freq: Every day | ORAL | 2 refills | Status: DC

## 2018-04-15 ENCOUNTER — Telehealth: Payer: Self-pay | Admitting: Hematology

## 2018-04-15 NOTE — Telephone Encounter (Signed)
Added MD visit on 5/14.called and spoke with patient. Confirmed date and time

## 2018-04-22 ENCOUNTER — Other Ambulatory Visit: Payer: Self-pay | Admitting: *Deleted

## 2018-04-22 MED ORDER — SOLIFENACIN SUCCINATE 10 MG PO TABS: 5.0000 mg | ORAL_TABLET | Freq: Every day | ORAL | 2 refills | Status: DC

## 2018-04-25 ENCOUNTER — Other Ambulatory Visit: Payer: Self-pay | Admitting: Nurse Practitioner

## 2018-05-17 NOTE — Progress Notes (Signed)
[place]   [phone] [phone]   Clinic Follow up Note   Patient Care Team: Mast, [name], NP as PCP - General (Internal Medicine) [name], MD as Consulting Physician (Dermatology) [name], MD as Consulting Physician ([place]) Jovita Kussmaul, MD as Consulting Physician (General Surgery) Raye Sorrow, MD as Consulting Physician (Pulmonary Disease) Truitt Merle, MD as Consulting Physician (Hematology) Gery Pray, MD as Consulting Physician (Radiation Oncology) Delice Bison, Charlestine Massed, NP as Nurse Practitioner (Hematology and Oncology) Annia Belt, MD as Consulting Physician (Oncology)  Date of Service:  05/20/2018  CHIEF COMPLAINT: Follow up malignant neoplasm of right and left breast and ET  SUMMARY OF ONCOLOGIC HISTORY: Oncology History   Cancer Staging Malignant neoplasm of upper-inner quadrant of right breast in female, estrogen receptor positive ([place]) Staging form: Breast, AJCC 8th Edition - Clinical stage from 01/07/2017: Stage IA (cT1c, cN0, cM0, G3, ER: Positive, PR: Positive, HER2: Negative) - Signed by Truitt Merle, MD on 01/20/2017  Malignant neoplasm of upper-outer quadrant of left breast in female, estrogen receptor positive ([place]) Staging form: Breast, AJCC 8th Edition - Clinical stage from 01/07/2017: Stage IA (cT1b, cN0, cM0, G2, ER: Positive, PR: Positive, HER2: Negative) - Signed by Truitt Merle, MD on 01/20/2017       Malignant neoplasm of upper-inner quadrant of right breast in female, estrogen receptor positive ([place])   01/01/2017 Mammogram    IMPRESSION: 1. 2.0 cm mass in the 2 o'clock position of the right breast with imaging features highly suspicious for malignancy. 2. 0.7 cm mass in the 2 o'clock position of the left breast with imaging features highly suspicious for malignancy. 3. No abnormal appearing axillary lymph nodes on either side.    01/07/2017 Receptors her2    Right Breast:  Prognostic indicators significant for: ER, 95% positive and PR, 50% positive, both with strong staining intensity. Proliferation marker Ki67 at 30. HER2 negative. RATIO OF HER2/CEP17 SIGNALS 0.81 AVERAGE HER2 COPY NUMBER PER CELL 1.70  Left Breast: Prognostic indicators significant for: ER, 100% positive and PR, 100% positive, both with strong staining intensity. Proliferation marker Ki67 at 10. HER2 negative. RATIO OF HER2/CEP17 SIGNALS 1.75 AVERAGE HER2 COPY NUMBER PER CELL 3.15    01/07/2017 Initial Biopsy    Diagnosis 1. Breast, right, needle core biopsy, 2:00 o'clock - INVASIVE DUCTAL CARCINOMA, SEE COMMENT. 2. Breast, left, needle core biopsy, 2:00 o'clock - INVASIVE MAMMARY CARCINOMA, SEE COMMENT.     01/20/2017 Initial Diagnosis    Malignant neoplasm of upper-inner quadrant of right breast in female, estrogen receptor positive ([place])    01/2017 - 03/2018 Anti-estrogen oral therapy    Anastrozole 1 mg daily started on 01/2017, stopped around 01/2018 due to diffuse joint pain. She switched to Exemestane on 03/26/2018 but stopped after 1 week due to gluteal pain.     02/20/2017 Surgery    RIGHT BREAST LUMPECTOMY (Right) LEFT BREAST LUMPECTOMY WITH RADIOACTIVE SEED LOCALIZATION (Left)  Per Dr. Marlou Starks     02/20/2017 Pathology Results     Diagnosis 1. Breast, lumpectomy, Left w/ seed - INVASIVE DUCTAL CARCINOMA, GRADE I/III, SPANNING 1.1 CM. - THE SURGICAL RESECTION MARGINS ARE NEGATIVE FOR CARCINOMA. - SEE ONCOLOGY TABLE BELOW. 2. Breast, lumpectomy, Right - INVASIVE DUCTAL CARCINOMA, GRADE II/III, SPANNING 2.1 CM. - DUCTAL CARCINOMA IN SITU, INTERMEDIATE GRADE. - PERINEURAL INVASION IS IDENTIFIED. - THE SURGICAL RESECTION MARGINS ARE NEGATIVE FOR CARCINOMA. - SEE ONCOLOGY TABLE BELOW. 3. Breast, excision, Right inferior - LOBULAR NEOPLASIA (ATYPICAL LOBULAR  HYPERPLASIA). - FAT NECROSIS. - SEE COMMENT.  Microscopic Comment 1. BREAST, INVASIVE TUMOR (LEFT) Procedure: Seed  localized lumpectomy Laterality: Left Tumor Size: 1.1 cm (gross measurement). Histologic Type: Ductal Grade: I Tubular Differentiation: 2 Nuclear Pleomorphism: 1 Mitotic Count: 1 Ductal Carcinoma in Situ (DCIS): Not identified Extent of Tumor: Confined to breast parenchyma. Margins: Greater than 0.2 cm to all margins. Regional Lymph Nodes: None examined. Breast Prognostic Profile: Case [id number] Estrogen Receptor: 100%, strong Progesterone Receptor: 100%, strong Her2: No amplification was detected. The ratio was 0.81 Ki-67: 10% Best tumor block for sendout testing: 1B Pathologic Stage Classification (pTNM, AJCC 8th Edition): Primary Tumor (pT): pT1c Regional Lymph Nodes (pN): pNX Distant Metastases (pM): pMX  2. BREAST, INVASIVE TUMOR (RIGHT) Procedure: Lumpectomy Laterality: Right Tumor Size: 2.1 cm (gross measurement) Histologic Type: Ductal Grade: II Tubular Differentiation: 3 Nuclear Pleomorphism: 2 Mitotic Count: 1 Ductal Carcinoma in Situ (DCIS): Present, intermediate grade Extent of Tumor: Confined to breast parenchyma. Margins: Greater than 0.2 cm to all margins Regional Lymph Nodes: None examined. Breast Prognostic Profile: Case [id number] Estrogen Receptor: 95%, strong Progesterone Receptor: 50%, strong Her2: No amplification was detected. The ratio was 0.81 Ki-67: 30% Best tumor block for sendout testing: 2A Pathologic Stage Classification (pTNM, AJCC 8th Edition): Primary Tumor (pT): pT2 Regional Lymph Nodes (pN): pNX Distant Metastases (pM): pMX 3. The surgical resection margin(s) of the specimen were inked and microscopically evaluated.    03/31/2017 - 05/11/2017 Radiation Therapy    1. Right Breast / 50 Gy in 25 fractions 2. Right Axilla / 45 Gy in 25 fractions 3. Right Breast Boost / 10 Gy in 5 fractions     Malignant neoplasm of upper-outer quadrant of left breast in female, estrogen receptor positive ([place])   01/20/2017 Initial Diagnosis     Malignant neoplasm of upper-outer quadrant of left breast in female, estrogen receptor positive ([place])    02/20/2017 Surgery    RIGHT BREAST LUMPECTOMY (Right) LEFT BREAST LUMPECTOMY WITH RADIOACTIVE SEED LOCALIZATION (Left)  Per Dr. Marlou Starks      02/12/2018 Mammogram    IMPRESSION: No mammographic evidence of breast malignancy       CURRENT THERAPY:  -Breast Cancer Surveillance  -Hydrea 1020m daily, dose increased in Jan 2020   -Zometa every 6 months for 2 years starting 07/22/18  INTERVAL HISTORY:  Jill KENNEBREWis here for a follow up. She presents to the clinic today by herself. She notes she is doing well. She notes having BG levels of a pre-diabetic. She notes she has changed her diet and she will soon repeat her A1c.  She notes she tries to remain active with walking. [facility] She notes gluteal pain from Exemestane so she stopped it after 1 week. The pain persists even after stopping it. She notes she does not want to restart anti-estrogen therapy. Her pain is manageable with ibuprofen.    REVIEW OF SYSTEMS:   Constitutional: Denies fevers, chills or abnormal weight loss Eyes: Denies blurriness of vision Ears, nose, mouth, throat, and face: Denies mucositis or sore throat Respiratory: Denies cough, dyspnea or wheezes Cardiovascular: Denies palpitation, chest discomfort or lower extremity swelling Gastrointestinal:  Denies nausea, heartburn or change in bowel habits Skin: Denies abnormal skin rashes MSK: (+) Gluteal pain  Lymphatics: Denies new lymphadenopathy or easy bruising Neurological:Denies numbness, tingling or new weaknesses Behavioral/Psych: Mood is stable, no new changes  All other systems were reviewed with the patient and are negative.  MEDICAL HISTORY:  Past Medical History:  Diagnosis Date  . Bladder cystocele 05/21/2010  . Complication of anesthesia    deglutition in history  . Deglutition disorder 08/17/2012    Single episode of choking/aspiration; preceded by trouble swallowing saliva (thin liquids).  For SLP evaluation. Aug 17, 2012.    [name] Disease of blood and blood forming organ 01/05/2007   Qualifier: Diagnosis of  By: Oneida Alar MD, KARL    . Diverticulitis   . DIVERTICULOSIS OF COLON 03/05/2006   Qualifier: Diagnosis of  By: Samara Snide    . DIZZINESS 01/18/2010   Qualifier: Diagnosis of  By: Lindell Noe MD, Jeneen Rinks    . Essential hypertension, benign 02/22/2008   Qualifier: Diagnosis of  By: Lindell Noe MD, Jeneen Rinks     . Essential thrombocythemia ([place]) 01/31/11  . Family history of Alzheimer's disease 11/11/2013   Patient with family history of Alzheimers disease. She herself is very highly functional.  Plans for future visit with MMSE as primary function of the visit.    [name] GERD (gastroesophageal reflux disease) 08/16/2010  . HALLUX VALGUS, ACQUIRED 04/10/2009   Qualifier: Diagnosis of  By: [name] CMA,, [place]    . Hypertension   . HYPERTRIGLYCERIDEMIA 01/05/2007   Qualifier: Diagnosis of  By: Oneida Alar MD, KARL    . Insomnia 02/10/2014  . Left-sided chest wall pain 12/19/2011    L sided chest wall pain follows a dermatomal distribution and raises the possibility of thoracic radiculopathic pain, which would be expected to improve with gabapentin as well. We discussed this at length and she will call to make me aware if this does not get better. Rib belt/girdle in the meantime; may consider rib films or CXR if not improving.     . Metatarsalgia of right foot 11/28/2013   Referral to [facility]   . Osteopenia   . Personal history of radiation therapy    03/2017 right breast  . Restless leg syndrome 06/09/2011  . Restless legs syndrome (RLS)   . URINARY INCONTINENCE, URGE, MILD 11/27/2009   Qualifier: Diagnosis of  By: Zebedee Iba NP, Manuela Schwartz    . Vitamin D deficiency 08/16/2010    SURGICAL HISTORY: Past Surgical History:  Procedure Laterality Date  . BIOPSY BREAST Left 1998   Dr. Rebekah Chesterfield  . BREAST EXCISIONAL BIOPSY    . BREAST  LUMPECTOMY Right 02/20/2017   Procedure: RIGHT BREAST LUMPECTOMY;  Surgeon: Jovita Kussmaul, MD;  Location: [place];  Service: General;  Laterality: Right;  . BREAST LUMPECTOMY Left 02/20/2017   left lumpectomy  . BREAST LUMPECTOMY WITH RADIOACTIVE SEED LOCALIZATION Left 02/20/2017   Procedure: LEFT BREAST LUMPECTOMY WITH RADIOACTIVE SEED LOCALIZATION;  Surgeon: Jovita Kussmaul, MD;  Location: [place];  Service: General;  Laterality: Left;  . CATARACT EXTRACTION  2010   Dr. [name]  . CHOLECYSTECTOMY  1992  . COLONOSCOPY  2008  . MOHS SURGERY  2012   on face [facility]  . NAILBED REPAIR  12/05/2010   Procedure: NAILBED REPAIR;  Surgeon: Cammie Sickle., MD;  Location: [place];  Service: Orthopedics;  Laterality: Right;  full thickness nail biopsy right hallux  . torn tenden  2012   hand Dr. Orie Rout    I have reviewed the social history and family history with the patient and they are unchanged from previous note.  ALLERGIES:  is allergic to doxycycline; macrodantin [nitrofurantoin]; and sulfamethoxazole-trimethoprim.  MEDICATIONS:  Current Outpatient Medications  Medication Sig Dispense Refill  . aspirin 81 MG  tablet Take 81 mg by mouth daily.      . calcium carbonate (OSCAL) 1500 (600 Ca) MG TABS tablet Take 600 mg of elemental calcium by mouth daily.    . cholecalciferol (VITAMIN D) 1000 UNITS tablet Take 1,000 Units by mouth daily.    [name] docusate sodium (COLACE) 250 MG capsule Take 250 mg by mouth daily.    [name] gabapentin (NEURONTIN) 100 MG capsule Take 100 mg by mouth 2 (two) times daily. Take 1 tablet in the am and 3 tablets in the pm     . Glucosamine-Chondroitin 750-600 MG TABS Take 1 tablet by mouth. Take one tablet daily    . hydrochlorothiazide (HYDRODIURIL) 25 MG tablet TAKE 1 TABLET BY MOUTH EVERY DAY 90 tablet 0  . hydroxyurea (HYDREA) 500 MG capsule Take 2 capsules (1,000 mg total) by mouth daily. May  take with food to minimize GI side effects. 60 capsule 3  . ibuprofen (ADVIL,MOTRIN) 200 MG tablet Take 200 mg by mouth as needed.    . Omega-3 Fatty Acids (FISH OIL PO) Take by mouth.    [name] omeprazole (PRILOSEC) 20 MG capsule Take 1 capsule (20 mg total) by mouth daily. 30 capsule 2  . polyethylene glycol (MIRALAX / GLYCOLAX) packet Take 17 g by mouth daily.      [name] pyridOXINE (VITAMIN B-6) 100 MG tablet Take 100 mg by mouth daily.    . solifenacin (VESICARE) 10 MG tablet Take 0.5 tablets (5 mg total) by mouth daily. 30 tablet 2   No current facility-administered medications for this visit.     PHYSICAL EXAMINATION: ECOG PERFORMANCE STATUS: 2 - Symptomatic, <50% confined to bed  Vitals:   05/20/18 1106  BP: (!) 160/85  Pulse: 70  Resp: 17  Temp: 97.7 F (36.5 C)  SpO2: 97%   Filed Weights   05/20/18 1106  Weight: 130 lb 9.6 oz (59.2 kg)    GENERAL:alert, no distress and comfortable SKIN: skin color, texture, turgor are normal, no rashes or significant lesions EYES: normal, Conjunctiva are pink and non-injected, sclera clear OROPHARYNX:no exudate, no erythema and lips, buccal mucosa, and tongue normal  NECK: supple, thyroid normal size, non-tender, without nodularity LYMPH:  no palpable lymphadenopathy in the cervical, axillary or inguinal LUNGS: clear to auscultation and percussion with normal breathing effort HEART: regular rate & rhythm and no murmurs and no lower extremity edema ABDOMEN:abdomen soft, non-tender and normal bowel sounds Musculoskeletal:no cyanosis of digits and no clubbing  NEURO: alert & oriented x 3 with fluent speech, no focal motor/sensory deficits BREAST: S/p b/l lumpectomies: Surgical incisions healed well (+) No palpable mass or adenopathy   LABORATORY DATA:  I have reviewed the data as listed CBC Latest Ref Rng & Units 05/20/2018 03/25/2018 03/09/2018  WBC 4.0 - 10.5 K/uL 5.3 4.5 6.2  Hemoglobin 12.0 - 15.0 g/dL 13.1 15.1(H) 14.8  Hematocrit 36.0 -  46.0 % 38.8 45.0 44.7  Platelets 150 - 400 K/uL 481(H) 459(H) 594(H)     CMP Latest Ref Rng & Units 05/20/2018 03/25/2018 02/16/2018  Glucose 70 - 99 mg/dL 91 93 76  BUN 8 - 23 mg/dL _0 Creatinine 0.44 - 1.00 mg/dL 0.84 0.78 0.85  Sodium 135 - 145 mmol/L 135 137 134  Potassium 3.5 - 5.1 mmol/L 4.0 3.8 4.4  Chloride 98 - 111 mmol/L 98 100 92(L)  CO2 22 - 32 mmol/L _1 Calcium 8.9 - 10.3 mg/dL 9.3 8.7(L) 9.4  Total Protein 6.5 - 8.1  g/dL 7.0 7.0 6.5  Total Bilirubin 0.3 - 1.2 mg/dL 0.5 0.7 0.5  Alkaline Phos 38 - 126 U/L 52 62 59  AST 15 - 41 U/L _0 ALT 0 - 44 U/L 35 33 25      RADIOGRAPHIC STUDIES: I have personally reviewed the radiological images as listed and agreed with the findings in the report. No results found.   ASSESSMENT & PLAN:  Jill Myers is a 85 y.o. female with   1. Bilateral breast cancer, invasive ductal carcinoma, right upper inner quadrant, pT2N0M0, stage IA, ER+/PR+/HER2-, G3, left upper-outer quadrant,pT1cN0M0, stage IA, ER+/PR+/HER2-, G2 -She was diagnosed on 01/20/2017, and is s/p  bilateral lumpectomy with negative margins,followed by adjuvant right breastand axillaryradiation. -She startedadjuvantAnastrozole on 01/2017 but held around 01/2018 due to bilateral hip pain. Her joint pain is now resolved. She started Exemestane in 03/2017, but stopped after 1 week due to gluteal pain. The pain has persisted even after stopping medication. -Given her very early stage breast cancer and her age, it is fine to discontinue anti-estrogen therapy. She is agreeable.  -She is otherwise doing clinically well. Labs reviewed with patient. Her physical exam and 02/2018 Mammogram were unremarkable. There is no concern for recurrence.  -Will continue continue breast cancer surveillance, with annual mammogram, self-exam, and routine follow-up with lab. Next mammogram in 02/2019 -f/u in 3 months   3. Essential thrombocythemia -On Hydrea dose currently 1000  mg = 2 capsules daily  -She is on ASA to reduce risk of thrombosis  -Labs reviewed, CBC and CMP WNL except PLT 481K (05/20/18), goal to keep plt<500k -We will continue Hydrea 1000 mg daily. Tolerating well.   3. Bilateral hip pain, Diffuse Joint pain, Gluteal pain  -She has had bilateral hip pain since 11/2017 but it started to diffuse  -Her joint pain has gradually resolved since she stopped anastrozole in January 2020. -She developed gluteal pain once she started Exemestane in 03/2018, she stopped 1 week later, but pain has persisted. Will not restart her on any anti-estrogen therapy.  -I encouraged her to continue exercise. She is physically active. -Pain managed with ibuprofen  4. Osteopenia  -on vit D and calcium supplement -She had a DEXA on 02/12/2018 and it showed a T-score of -2.2 at right hip. Her overall fracture risk of 16%.  -We previously discussed treatment options to strengthen her bones with biphosphonate and prevent hip fracture.  -She opted for Zometa infusion. Potential benefits and side effects, especially risk of jaw necrosis, were discussed with patient in details. She agrees to proceed  -We start Zometa on 07/22/18 and continue every 6 months for 2 years. I encouraged her to contact clinic if she develops any significant or unexpected side effects.  -I encouraged her to continue her calcium and vitD supplement  5. HTN, GERD -on HCTZ, Omeprazole  -F/u PCP   6. Morton's neuroma of third interspaces of both feet, Restless leg syndrome -Stable, continue Gabapentin 374m bid, prn Ibuprofen.  PLAN: -Labs reviewed -Continue Hydrea 10066mdaily  -Lab and f/u in 3 months with zometa infusion    No problem-specific Assessment & Plan notes found for this encounter.   No orders of the defined types were placed in this encounter.  All questions were answered. The patient knows to call the clinic with any problems, questions or concerns. No barriers to learning was  detected. I spent 15 minutes counseling the patient face to face. The total time spent in the appointment  was 20 minutes and more than 50% was on counseling and review of test results     Truitt Merle, MD 05/20/2018   I, Joslyn Devon, am acting as scribe for Truitt Merle, MD.   I have reviewed the above documentation for accuracy and completeness, and I agree with the above.

## 2018-05-20 ENCOUNTER — Inpatient Hospital Stay: Payer: Medicare Other

## 2018-05-20 ENCOUNTER — Other Ambulatory Visit: Payer: Self-pay

## 2018-05-20 ENCOUNTER — Encounter: Payer: Self-pay | Admitting: Hematology

## 2018-05-20 ENCOUNTER — Telehealth: Payer: Self-pay | Admitting: Hematology

## 2018-05-20 ENCOUNTER — Other Ambulatory Visit: Payer: Medicare Other

## 2018-05-20 ENCOUNTER — Inpatient Hospital Stay: Payer: Medicare Other | Attending: Hematology | Admitting: Hematology

## 2018-05-20 VITALS — BP 160/85 | HR 70 | Temp 97.7°F | Resp 17 | Ht 63.0 in | Wt 130.6 lb

## 2018-05-20 DIAGNOSIS — C50412 Malignant neoplasm of upper-outer quadrant of left female breast: Secondary | ICD-10-CM | POA: Insufficient documentation

## 2018-05-20 DIAGNOSIS — G2581 Restless legs syndrome: Secondary | ICD-10-CM | POA: Diagnosis not present

## 2018-05-20 DIAGNOSIS — I10 Essential (primary) hypertension: Secondary | ICD-10-CM | POA: Diagnosis not present

## 2018-05-20 DIAGNOSIS — C50211 Malignant neoplasm of upper-inner quadrant of right female breast: Secondary | ICD-10-CM | POA: Diagnosis present

## 2018-05-20 DIAGNOSIS — Z923 Personal history of irradiation: Secondary | ICD-10-CM | POA: Diagnosis not present

## 2018-05-20 DIAGNOSIS — Z170 Estrogen receptor positive status [ER+]: Secondary | ICD-10-CM | POA: Diagnosis not present

## 2018-05-20 DIAGNOSIS — D473 Essential (hemorrhagic) thrombocythemia: Secondary | ICD-10-CM | POA: Diagnosis not present

## 2018-05-20 DIAGNOSIS — R7303 Prediabetes: Secondary | ICD-10-CM

## 2018-05-20 DIAGNOSIS — Z79811 Long term (current) use of aromatase inhibitors: Secondary | ICD-10-CM | POA: Insufficient documentation

## 2018-05-20 DIAGNOSIS — Z79899 Other long term (current) drug therapy: Secondary | ICD-10-CM | POA: Diagnosis not present

## 2018-05-20 DIAGNOSIS — M8580 Other specified disorders of bone density and structure, unspecified site: Secondary | ICD-10-CM | POA: Diagnosis not present

## 2018-05-20 DIAGNOSIS — Z7982 Long term (current) use of aspirin: Secondary | ICD-10-CM | POA: Insufficient documentation

## 2018-05-20 DIAGNOSIS — K219 Gastro-esophageal reflux disease without esophagitis: Secondary | ICD-10-CM | POA: Insufficient documentation

## 2018-05-20 DIAGNOSIS — G5760 Lesion of plantar nerve, unspecified lower limb: Secondary | ICD-10-CM | POA: Diagnosis not present

## 2018-05-20 LAB — CBC WITH DIFFERENTIAL (CANCER CENTER ONLY)
Abs Immature Granulocytes: 0.03 10*3/uL (ref 0.00–0.07)
Basophils Absolute: 0 10*3/uL (ref 0.0–0.1)
Basophils Relative: 1 %
Eosinophils Absolute: 0.1 10*3/uL (ref 0.0–0.5)
Eosinophils Relative: 1 %
HCT: 38.8 % (ref 36.0–46.0)
Hemoglobin: 13.1 g/dL (ref 12.0–15.0)
Immature Granulocytes: 1 %
Lymphocytes Relative: 13 %
Lymphs Abs: 0.7 10*3/uL (ref 0.7–4.0)
MCH: 38.8 pg — ABNORMAL HIGH (ref 26.0–34.0)
MCHC: 33.8 g/dL (ref 30.0–36.0)
MCV: 114.8 fL — ABNORMAL HIGH (ref 80.0–100.0)
Monocytes Absolute: 0.8 10*3/uL (ref 0.1–1.0)
Monocytes Relative: 15 %
Neutro Abs: 3.7 10*3/uL (ref 1.7–7.7)
Neutrophils Relative %: 69 %
Platelet Count: 481 10*3/uL — ABNORMAL HIGH (ref 150–400)
RBC: 3.38 MIL/uL — ABNORMAL LOW (ref 3.87–5.11)
RDW: 15.9 % — ABNORMAL HIGH (ref 11.5–15.5)
WBC Count: 5.3 10*3/uL (ref 4.0–10.5)
nRBC: 0 % (ref 0.0–0.2)

## 2018-05-20 LAB — CMP (CANCER CENTER ONLY)
ALT: 35 U/L (ref 0–44)
AST: 26 U/L (ref 15–41)
Albumin: 4.1 g/dL (ref 3.5–5.0)
Alkaline Phosphatase: 52 U/L (ref 38–126)
Anion gap: 7 (ref 5–15)
BUN: 18 mg/dL (ref 8–23)
CO2: 30 mmol/L (ref 22–32)
Calcium: 9.3 mg/dL (ref 8.9–10.3)
Chloride: 98 mmol/L (ref 98–111)
Creatinine: 0.84 mg/dL (ref 0.44–1.00)
GFR, Est AFR Am: >60 mL/min (ref >60)
GFR, Estimated: >60 mL/min (ref >60)
Glucose, Bld: 91 mg/dL (ref 70–99)
Potassium: 4 mmol/L (ref 3.5–5.1)
Sodium: 135 mmol/L (ref 135–145)
Total Bilirubin: 0.5 mg/dL (ref 0.3–1.2)
Total Protein: 7 g/dL (ref 6.5–8.1)

## 2018-05-20 NOTE — Telephone Encounter (Signed)
Scheduled appt per 5/14 los. ° °A calendar will be mailed out. °

## 2018-06-15 ENCOUNTER — Telehealth: Payer: Self-pay | Admitting: *Deleted

## 2018-06-15 DIAGNOSIS — D473 Essential (hemorrhagic) thrombocythemia: Secondary | ICD-10-CM

## 2018-06-15 MED ORDER — HYDROXYUREA 500 MG PO CAPS: 1000.0000 mg | ORAL_CAPSULE | Freq: Every day | ORAL | 3 refills | Status: DC

## 2018-06-15 NOTE — Telephone Encounter (Signed)
Received vm call from pt that she needs refill on hydrea script originally from Dr Beryle Beams.  Reviewed chart & pt is to cont 1000mg  daily.  Script sent in.

## 2018-06-24 ENCOUNTER — Non-Acute Institutional Stay: Payer: Medicare Other | Admitting: Nurse Practitioner

## 2018-06-24 ENCOUNTER — Encounter: Payer: Medicare Other | Admitting: Nurse Practitioner

## 2018-06-24 ENCOUNTER — Other Ambulatory Visit: Payer: Self-pay

## 2018-06-24 ENCOUNTER — Encounter: Payer: Self-pay | Admitting: Nurse Practitioner

## 2018-06-24 DIAGNOSIS — K5909 Other constipation: Secondary | ICD-10-CM

## 2018-06-24 DIAGNOSIS — G2581 Restless legs syndrome: Secondary | ICD-10-CM

## 2018-06-24 DIAGNOSIS — D473 Essential (hemorrhagic) thrombocythemia: Secondary | ICD-10-CM | POA: Diagnosis not present

## 2018-06-24 DIAGNOSIS — N8110 Cystocele, unspecified: Secondary | ICD-10-CM

## 2018-06-24 DIAGNOSIS — I10 Essential (primary) hypertension: Secondary | ICD-10-CM | POA: Diagnosis not present

## 2018-06-24 DIAGNOSIS — M7741 Metatarsalgia, right foot: Secondary | ICD-10-CM

## 2018-06-24 NOTE — Assessment & Plan Note (Signed)
Blood pressure is controlled, continue HCTZ 25mg  qd.

## 2018-06-24 NOTE — Assessment & Plan Note (Signed)
Stable, continue Vesicare 5mg  qd.

## 2018-06-24 NOTE — Assessment & Plan Note (Signed)
Stable, continue MiraLax qd, Colace 250mg  qd.

## 2018-06-24 NOTE — Assessment & Plan Note (Signed)
Stable, continue Gabapentin 100mg  qam, 300mg  qpm.

## 2018-06-24 NOTE — Assessment & Plan Note (Addendum)
Chronic, progressing, PT to eval and treat as needed. Prn Ibuprofen.

## 2018-06-24 NOTE — Patient Instructions (Addendum)
PT to eval and treat pain in feet, muscle aches, strengthening. F/u in clinic [place] in 4 months with Dr. Lyndel Safe

## 2018-06-24 NOTE — Assessment & Plan Note (Addendum)
Last plt 481 05/20/18, continue Hydroxyurea 1000mg  qd. F/u oncology.

## 2018-06-24 NOTE — Progress Notes (Signed)
Location:   clinic [place]   Place of Service:  Clinic (12) Provider: Marlana Latus NP  Code Status: DNR Goals of Care: [place] 06/24/2018  Does Patient Have a Medical Advance Directive? Yes  Type of Advance Directive Living will;Healthcare Power of Attorney  Does patient want to make changes to medical advance directive? No - Patient declined  Copy of [place] in Chart? Yes - validated most recent copy scanned in chart (See row information)  Would patient like information on creating a medical advance directive? -     Chief Complaint  Patient presents with  . Medical Management of Chronic Issues    6 month follow up, discuss lab work, problems with cancer medication     HPI: Patient is a 85 y.o. female seen today for medical management of chronic diseases.     The patient has history of bladder cystocele, stable on Vesicare 5mg  qd. Constipation, controlled on MiraLax qd, Colace 250mg  qd.  Essential thrombocythemia, Hydroxyurea 1000mg  qd. HTN, blood pressure is controlled on HCTZ 25mg  qd. Restless legs, stable on Gabapentin 100mg  qam, 300mg  qpm. Ibuprofen prn, OA/muscle pain/foot pain  Past Medical History:  Diagnosis Date  . Bladder cystocele 05/21/2010  . Complication of anesthesia    deglutition in history  . Deglutition disorder 08/17/2012   Single episode of choking/aspiration; preceded by trouble swallowing saliva (thin liquids).  For SLP evaluation. Aug 17, 2012.    [name] Disease of blood and blood forming organ 01/05/2007   Qualifier: Diagnosis of  By: Oneida Alar MD, KARL    . Diverticulitis   . DIVERTICULOSIS OF COLON 03/05/2006   Qualifier: Diagnosis of  By: Samara Snide    . DIZZINESS 01/18/2010   Qualifier: Diagnosis of  By: Lindell Noe MD, Jeneen Rinks    . Essential hypertension, benign 02/22/2008   Qualifier: Diagnosis of  By: Lindell Noe MD, Jeneen Rinks     . Essential thrombocythemia ([place]) 01/31/11  . Family history of Alzheimer's disease 11/11/2013   Patient with  family history of Alzheimers disease. She herself is very highly functional.  Plans for future visit with MMSE as primary function of the visit.    [name] GERD (gastroesophageal reflux disease) 08/16/2010  . HALLUX VALGUS, ACQUIRED 04/10/2009   Qualifier: Diagnosis of  By: [name] CMA,, [place]    . Hypertension   . HYPERTRIGLYCERIDEMIA 01/05/2007   Qualifier: Diagnosis of  By: Oneida Alar MD, KARL    . Insomnia 02/10/2014  . Left-sided chest wall pain 12/19/2011    L sided chest wall pain follows a dermatomal distribution and raises the possibility of thoracic radiculopathic pain, which would be expected to improve with gabapentin as well. We discussed this at length and she will call to make me aware if this does not get better. Rib belt/girdle in the meantime; may consider rib films or CXR if not improving.     . Metatarsalgia of right foot 11/28/2013   Referral to [facility]   . Osteopenia   . Personal history of radiation therapy    03/2017 right breast  . Restless leg syndrome 06/09/2011  . Restless legs syndrome (RLS)   . URINARY INCONTINENCE, URGE, MILD 11/27/2009   Qualifier: Diagnosis of  By: Zebedee Iba NP, Manuela Schwartz    . Vitamin D deficiency 08/16/2010    Past Surgical History:  Procedure Laterality Date  . BIOPSY BREAST Left 1998   Dr. Rebekah Chesterfield  . BREAST EXCISIONAL BIOPSY    . BREAST LUMPECTOMY Right 02/20/2017   Procedure: RIGHT BREAST  LUMPECTOMY;  Surgeon: Jovita Kussmaul, MD;  Location: [place];  Service: General;  Laterality: Right;  . BREAST LUMPECTOMY Left 02/20/2017   left lumpectomy  . BREAST LUMPECTOMY WITH RADIOACTIVE SEED LOCALIZATION Left 02/20/2017   Procedure: LEFT BREAST LUMPECTOMY WITH RADIOACTIVE SEED LOCALIZATION;  Surgeon: Jovita Kussmaul, MD;  Location: [place];  Service: General;  Laterality: Left;  . CATARACT EXTRACTION  2010   Dr. [name]  . CHOLECYSTECTOMY  1992  . COLONOSCOPY  2008  . MOHS SURGERY  2012   on face [facility] [place]  .  NAILBED REPAIR  12/05/2010   Procedure: NAILBED REPAIR;  Surgeon: Cammie Sickle., MD;  Location: [place];  Service: Orthopedics;  Laterality: Right;  full thickness nail biopsy right hallux  . torn tenden  2012   hand Dr. Orie Rout    Allergies  Allergen Reactions  . Doxycycline Nausea And Vomiting  . Macrodantin [Nitrofurantoin] Nausea And Vomiting  . Sulfamethoxazole-Trimethoprim Rash    REACTION: Rash after completing course of Septra    Allergies as of 06/24/2018      Reactions   Doxycycline Nausea And Vomiting   Macrodantin [nitrofurantoin] Nausea And Vomiting   Sulfamethoxazole-trimethoprim Rash   REACTION: Rash after completing course of Septra      Medication List       Accurate as of June 24, 2018  4:39 PM. If you have any questions, ask your nurse or doctor.        aspirin 81 MG tablet Take 81 mg by mouth daily.   calcium carbonate 1500 (600 Ca) MG Tabs tablet Commonly known as: OSCAL Take 600 mg of elemental calcium by mouth daily.   cholecalciferol 1000 units tablet Commonly known as: VITAMIN D Take 1,000 Units by mouth daily.   docusate sodium 250 MG capsule Commonly known as: COLACE Take 250 mg by mouth daily.   FISH OIL PO Take by mouth.   gabapentin 100 MG capsule Commonly known as: NEURONTIN Take 100 mg by mouth 2 (two) times daily. Take 1 tablet in the am and 3 tablets in the pm   Glucosamine-Chondroitin 750-600 MG Tabs Take 1 tablet by mouth. Take one tablet daily   hydrochlorothiazide 25 MG tablet Commonly known as: HYDRODIURIL TAKE 1 TABLET BY MOUTH EVERY DAY   hydroxyurea 500 MG capsule Commonly known as: Hydrea Take 2 capsules (1,000 mg total) by mouth daily. May take with food to minimize GI side effects.   ibuprofen 200 MG tablet Commonly known as: ADVIL Take 200 mg by mouth as needed.   Magnesium 500 MG Tabs Take 1 tablet by mouth daily.   omeprazole 20 MG capsule Commonly known as: PRILOSEC Take 1  capsule (20 mg total) by mouth daily.   polyethylene glycol 17 g packet Commonly known as: MIRALAX / GLYCOLAX Take 17 g by mouth daily.   pyridOXINE 100 MG tablet Commonly known as: VITAMIN B-6 Take 100 mg by mouth daily.   solifenacin 10 MG tablet Commonly known as: VESICARE Take 0.5 tablets (5 mg total) by mouth daily.       Review of Systems:  Review of Systems  Constitutional: Negative for activity change, appetite change, chills, diaphoresis, fatigue, fever and unexpected weight change.  HENT: Positive for hearing loss. Negative for congestion and voice change.   Respiratory: Negative for cough, shortness of breath and wheezing.   Cardiovascular: Positive for leg swelling. Negative for chest pain and palpitations.  Gastrointestinal: Negative for  abdominal distention, abdominal pain, constipation, diarrhea, nausea and vomiting.  Genitourinary: Negative for difficulty urinating, dysuria and urgency.       May 2x per night.   Musculoskeletal: Positive for arthralgias, gait problem and myalgias.       Lower back pian in am, better as day goes. Foot pain R>L. Muscle aches sometimes.   Skin: Negative for color change and pallor.  Neurological: Negative for dizziness, speech difficulty, weakness and headaches.  Psychiatric/Behavioral: Positive for sleep disturbance. Negative for agitation, behavioral problems and hallucinations. The patient is not nervous/anxious.        Some times, doesn't want sleeping aids    Health Maintenance  Topic Date Due  . INFLUENZA VACCINE  08/07/2018  . TETANUS/TDAP  04/11/2019  . DEXA SCAN  Completed  . PNA vac Low Risk Adult  Completed    Physical Exam: Vitals:   06/24/18 1407  BP: 124/62  Pulse: 72  Temp: 98.5 F (36.9 C)  SpO2: 94%  Weight: 131 lb 6.4 oz (59.6 kg)  Height: 5\' 3"  (1.6 m)   Body mass index is 23.28 kg/m. Physical Exam Constitutional:      General: She is not in acute distress.    Appearance: Normal appearance. She  is normal weight. She is not ill-appearing, toxic-appearing or diaphoretic.  HENT:     Head: Normocephalic and atraumatic.     Nose: Nose normal. No congestion or rhinorrhea.     Mouth/Throat:     Mouth: Mucous membranes are moist.  Eyes:     Extraocular Movements: Extraocular movements intact.     Conjunctiva/sclera: Conjunctivae normal.     Pupils: Pupils are equal, round, and reactive to light.  Neck:     Musculoskeletal: Normal range of motion and neck supple.  Cardiovascular:     Rate and Rhythm: Normal rate and regular rhythm.     Heart sounds: Murmur present.  Pulmonary:     Effort: Pulmonary effort is normal.     Breath sounds: Normal breath sounds. No wheezing, rhonchi or rales.  Abdominal:     General: Bowel sounds are normal. There is no distension.     Palpations: Abdomen is soft.     Tenderness: There is no abdominal tenderness. There is no right CVA tenderness, left CVA tenderness, guarding or rebound.  Musculoskeletal:     Right lower leg: Edema present.     Left lower leg: Edema present.     Comments: Trace edema BLE  Skin:    General: Skin is warm and dry.  Neurological:     General: No focal deficit present.     Mental Status: She is alert and oriented to person, place, and time. Mental status is at baseline.     Cranial Nerves: No cranial nerve deficit.     Motor: No weakness.     Coordination: Coordination normal.     Gait: Gait abnormal.  Psychiatric:        Mood and Affect: Mood normal.        Behavior: Behavior normal.        Thought Content: Thought content normal.        Judgment: Judgment normal.     Labs reviewed: Basic Metabolic Panel: Recent Labs    12/10/17 0705 02/16/18 1011 03/25/18 0941 05/20/18 1036  NA 135 134 137 135  K 4.2 4.4 3.8 4.0  CL 99 92* 100 98  CO2 29 24 28 30   GLUCOSE 103* 76 93 91  BUN 14 11  15 18  CREATININE 0.74 0.85 0.78 0.84  CALCIUM 9.4 9.4 8.7* 9.3  TSH 3.35  --   --   --    Liver Function Tests:  Recent Labs    02/16/18 1011 03/25/18 0941 05/20/18 1036  AST 23 26 26   ALT 25 33 35  ALKPHOS 59 62 52  BILITOT 0.5 0.7 0.5  PROT 6.5 7.0 7.0  ALBUMIN 4.2 4.0 4.1   No results for input(s): LIPASE, AMYLASE in the last 8760 hours. No results for input(s): AMMONIA in the last 8760 hours. CBC: Recent Labs    03/09/18 0957 03/25/18 0941 05/20/18 1036  WBC 6.2 4.5 5.3  NEUTROABS 4.6 2.9 3.7  HGB 14.8 15.1* 13.1  HCT 44.7 45.0 38.8  MCV 102* 104.4* 114.8*  PLT 594* 459* 481*   Lipid Panel: Recent Labs    12/10/17 0705  CHOL 154  HDL 45*  LDLCALC 87  TRIG 122  CHOLHDL 3.4   No results found for: HGBA1C  Procedures since last visit: No results found.  Assessment/Plan  Chronic constipation Stable, continue MiraLax qd, Colace 250mg  qd.   Cystocele, unspecified (CODE) Stable, continue Vesicare 5mg  qd.   Essential thrombocythemia ([place]) Last plt 481 05/20/18, continue Hydroxyurea 1000mg  qd. F/u oncology.  Hypertension Blood pressure is controlled, continue HCTZ 25mg  qd.   Restless leg syndrome Stable, continue Gabapentin 100mg  qam, 300mg  qpm.   Metatarsalgia of right foot Chronic, progressing, PT to eval and treat as needed. Prn Ibuprofen.    Labs/tests ordered: none  Next appt:  4 months with Dr. Lyndel Safe

## 2018-07-01 ENCOUNTER — Encounter: Payer: Medicare Other | Admitting: Nurse Practitioner

## 2018-07-05 ENCOUNTER — Other Ambulatory Visit: Payer: Self-pay | Admitting: Nurse Practitioner

## 2018-07-05 DIAGNOSIS — K219 Gastro-esophageal reflux disease without esophagitis: Secondary | ICD-10-CM

## 2018-07-18 ENCOUNTER — Other Ambulatory Visit: Payer: Self-pay | Admitting: Nurse Practitioner

## 2018-07-22 ENCOUNTER — Ambulatory Visit: Payer: Medicare Other

## 2018-07-22 ENCOUNTER — Other Ambulatory Visit: Payer: Medicare Other

## 2018-07-22 ENCOUNTER — Ambulatory Visit: Payer: Medicare Other | Admitting: Hematology

## 2018-07-29 ENCOUNTER — Other Ambulatory Visit: Payer: Self-pay | Admitting: Nurse Practitioner

## 2018-07-29 DIAGNOSIS — K219 Gastro-esophageal reflux disease without esophagitis: Secondary | ICD-10-CM

## 2018-08-18 NOTE — Progress Notes (Signed)
Jill Myers   [phone] [phone]   Clinic Follow up Note   Patient Care Team: Mast, [name], NP as PCP - General (Internal Medicine) [name], MD as Consulting Physician (Dermatology) [name], MD as Consulting Physician ([place]) Jovita Kussmaul, MD as Consulting Physician (General Surgery) Raye Sorrow, MD as Consulting Physician (Pulmonary Disease) Truitt Merle, MD as Consulting Physician (Hematology) Gery Pray, MD as Consulting Physician (Radiation Oncology) Delice Bison, Charlestine Massed, NP as Nurse Practitioner (Hematology and Oncology) Annia Belt, MD as Consulting Physician (Oncology)  Date of Service:  08/20/2018  CHIEF COMPLAINT: Follow up malignant neoplasm of right and left breastand ET  SUMMARY OF ONCOLOGIC HISTORY: Oncology History Overview Note  Cancer Staging Malignant neoplasm of upper-inner quadrant of right breast in female, estrogen receptor positive ([place]) Staging form: Breast, AJCC 8th Edition - Clinical stage from 01/07/2017: Stage IA (cT1c, cN0, cM0, G3, ER: Positive, PR: Positive, HER2: Negative) - Signed by Truitt Merle, MD on 01/20/2017  Malignant neoplasm of upper-outer quadrant of left breast in female, estrogen receptor positive ([place]) Staging form: Breast, AJCC 8th Edition - Clinical stage from 01/07/2017: Stage IA (cT1b, cN0, cM0, G2, ER: Positive, PR: Positive, HER2: Negative) - Signed by Truitt Merle, MD on 01/20/2017     Malignant neoplasm of upper-inner quadrant of right breast in female, estrogen receptor positive ([place])  01/01/2017 Mammogram   IMPRESSION: 1. 2.0 cm mass in the 2 o'clock position of the right breast with imaging features highly suspicious for malignancy. 2. 0.7 cm mass in the 2 o'clock position of the left breast with imaging features highly suspicious for malignancy. 3. No abnormal appearing axillary lymph nodes on either side.   01/07/2017 Receptors her2   Right Breast:  Prognostic indicators significant for: ER, 95% positive and PR, 50% positive, both with strong staining intensity. Proliferation marker Ki67 at 30. HER2 negative. RATIO OF HER2/CEP17 SIGNALS 0.81 AVERAGE HER2 COPY NUMBER PER CELL 1.70  Left Breast: Prognostic indicators significant for: ER, 100% positive and PR, 100% positive, both with strong staining intensity. Proliferation marker Ki67 at 10. HER2 negative. RATIO OF HER2/CEP17 SIGNALS 1.75 AVERAGE HER2 COPY NUMBER PER CELL 3.15   01/07/2017 Initial Biopsy   Diagnosis 1. Breast, right, needle core biopsy, 2:00 o'clock - INVASIVE DUCTAL CARCINOMA, SEE COMMENT. 2. Breast, left, needle core biopsy, 2:00 o'clock - INVASIVE MAMMARY CARCINOMA, SEE COMMENT.    01/20/2017 Initial Diagnosis   Malignant neoplasm of upper-inner quadrant of right breast in female, estrogen receptor positive ([place])   01/2017 - 03/2018 Anti-estrogen oral therapy   Anastrozole 1 mg daily started on 01/2017, stopped around 01/2018 due to diffuse joint pain. She switched to Exemestane on 03/26/2018 but stopped after 1 week due to gluteal pain.    02/20/2017 Surgery   RIGHT BREAST LUMPECTOMY (Right) LEFT BREAST LUMPECTOMY WITH RADIOACTIVE SEED LOCALIZATION (Left)  Per Dr. Marlou Starks    02/20/2017 Pathology Results    Diagnosis 1. Breast, lumpectomy, Left w/ seed - INVASIVE DUCTAL CARCINOMA, GRADE I/III, SPANNING 1.1 CM. - THE SURGICAL RESECTION MARGINS ARE NEGATIVE FOR CARCINOMA. - SEE ONCOLOGY TABLE BELOW. 2. Breast, lumpectomy, Right - INVASIVE DUCTAL CARCINOMA, GRADE II/III, SPANNING 2.1 CM. - DUCTAL CARCINOMA IN SITU, INTERMEDIATE GRADE. - PERINEURAL INVASION IS IDENTIFIED. - THE SURGICAL RESECTION MARGINS ARE NEGATIVE FOR CARCINOMA. - SEE ONCOLOGY TABLE BELOW. 3. Breast, excision, Right inferior - LOBULAR NEOPLASIA (ATYPICAL LOBULAR HYPERPLASIA). - FAT NECROSIS. - SEE COMMENT.  Microscopic Comment 1. BREAST, INVASIVE TUMOR (LEFT) Procedure:  Seed localized  lumpectomy Laterality: Left Tumor Size: 1.1 cm (gross measurement). Histologic Type: Ductal Grade: I Tubular Differentiation: 2 Nuclear Pleomorphism: 1 Mitotic Count: 1 Ductal Carcinoma in Situ (DCIS): Not identified Extent of Tumor: Confined to breast parenchyma. Margins: Greater than 0.2 cm to all margins. Regional Lymph Nodes: None examined. Breast Prognostic Profile: Case [id number] Estrogen Receptor: 100%, strong Progesterone Receptor: 100%, strong Her2: No amplification was detected. The ratio was 0.81 Ki-67: 10% Best tumor block for sendout testing: 1B Pathologic Stage Classification (pTNM, AJCC 8th Edition): Primary Tumor (pT): pT1c Regional Lymph Nodes (pN): pNX Distant Metastases (pM): pMX  2. BREAST, INVASIVE TUMOR (RIGHT) Procedure: Lumpectomy Laterality: Right Tumor Size: 2.1 cm (gross measurement) Histologic Type: Ductal Grade: II Tubular Differentiation: 3 Nuclear Pleomorphism: 2 Mitotic Count: 1 Ductal Carcinoma in Situ (DCIS): Present, intermediate grade Extent of Tumor: Confined to breast parenchyma. Margins: Greater than 0.2 cm to all margins Regional Lymph Nodes: None examined. Breast Prognostic Profile: Case [id number] Estrogen Receptor: 95%, strong Progesterone Receptor: 50%, strong Her2: No amplification was detected. The ratio was 0.81 Ki-67: 30% Best tumor block for sendout testing: 2A Pathologic Stage Classification (pTNM, AJCC 8th Edition): Primary Tumor (pT): pT2 Regional Lymph Nodes (pN): pNX Distant Metastases (pM): pMX 3. The surgical resection margin(s) of the specimen were inked and microscopically evaluated.   03/31/2017 - 05/11/2017 Radiation Therapy   1. Right Breast / 50 Gy in 25 fractions 2. Right Axilla / 45 Gy in 25 fractions 3. Right Breast Boost / 10 Gy in 5 fractions   Malignant neoplasm of upper-outer quadrant of left breast in female, estrogen receptor positive ([place])  01/20/2017 Initial Diagnosis   Malignant  neoplasm of upper-outer quadrant of left breast in female, estrogen receptor positive ([place])   02/20/2017 Surgery   RIGHT BREAST LUMPECTOMY (Right) LEFT BREAST LUMPECTOMY WITH RADIOACTIVE SEED LOCALIZATION (Left)  Per Dr. Marlou Starks     02/12/2018 Mammogram   IMPRESSION: No mammographic evidence of breast malignancy       CURRENT THERAPY:  -Breast Cancer Surveillance  -Hydrea 1027m daily, dose increased in Jan 2020 -Zometa every 6 months for 2 years starting 08/20/18  INTERVAL HISTORY:  Jill SOOTSis here for a follow up ET and breast cancer. She presents to the clinic alone. She notes she is doing well. She is able to driver herself around. She is currently going through PT to help her chronic neck pain. She has been doing ice therapy and [place]. She takes Gabapentin TID at night. She denies new pain, her BM are managed with miralax. She has been eating more vegetables and fruit. She has insomnia which is manageable so far. She continues to take hydrea 10038mdaily and tolerating well.  [facility] [place] often. [place]   REVIEW OF SYSTEMS:   Constitutional: Denies fevers, chills or abnormal weight loss (+) manageable insomnia Eyes: Denies blurriness of vision Ears, nose, mouth, throat, and face: Denies mucositis or sore throat Respiratory: Denies cough, dyspnea or wheezes Cardiovascular: Denies palpitation, chest discomfort or lower extremity swelling Gastrointestinal:  Denies nausea, heartburn or change in bowel habits Skin: Denies abnormal skin rashes Lymphatics: Denies new lymphadenopathy or easy bruising Neurological:Denies numbness, tingling or new weaknesses Behavioral/Psych: Mood is stable, no new changes  All other systems were reviewed with the patient and are negative.  MEDICAL HISTORY:  Past Medical History:   Diagnosis Date  . Bladder cystocele 05/21/2010  . Complication of anesthesia    deglutition in history  . Deglutition disorder 08/17/2012   Single episode of choking/aspiration; preceded by trouble swallowing saliva (thin liquids).  For SLP evaluation. Aug 17, 2012.    [name] Disease of blood and blood forming organ 01/05/2007   Qualifier: Diagnosis of  By: Oneida Alar MD, KARL    . Diverticulitis   . DIVERTICULOSIS OF COLON 03/05/2006   Qualifier: Diagnosis of  By: Samara Snide    . DIZZINESS 01/18/2010   Qualifier: Diagnosis of  By: Lindell Noe MD, Jeneen Rinks    . Essential hypertension, benign 02/22/2008   Qualifier: Diagnosis of  By: Lindell Noe MD, Jeneen Rinks     . Essential thrombocythemia ([place]) 01/31/11  . Family history of Alzheimer's disease 11/11/2013   Patient with family history of Alzheimers disease. She herself is very highly functional.  Plans for future visit with MMSE as primary function of the visit.    [name] GERD (gastroesophageal reflux disease) 08/16/2010  . HALLUX VALGUS, ACQUIRED 04/10/2009   Qualifier: Diagnosis of  By: [name] CMA,, [place]    . Hypertension   . HYPERTRIGLYCERIDEMIA 01/05/2007   Qualifier: Diagnosis of  By: Oneida Alar MD, KARL    . Insomnia 02/10/2014  . Left-sided chest wall pain 12/19/2011    L sided chest wall pain follows a dermatomal distribution and raises the possibility of thoracic radiculopathic pain, which would be expected to improve with gabapentin as well. We discussed this at length and she will call to make me aware if this does not get better. Rib belt/girdle in the meantime; may consider rib films or CXR if not improving.     . Metatarsalgia of right foot 11/28/2013   Referral to [facility]   . Osteopenia   . Personal history of radiation therapy    03/2017 right breast  . Restless leg syndrome 06/09/2011  . Restless legs syndrome (RLS)   . URINARY INCONTINENCE, URGE, MILD 11/27/2009   Qualifier: Diagnosis of  By: Zebedee Iba NP, Manuela Schwartz    . Vitamin D deficiency 08/16/2010    SURGICAL  HISTORY: Past Surgical History:  Procedure Laterality Date  . BIOPSY BREAST Left 1998   Dr. Rebekah Chesterfield  . BREAST EXCISIONAL BIOPSY    . BREAST LUMPECTOMY Right 02/20/2017   Procedure: RIGHT BREAST LUMPECTOMY;  Surgeon: Jovita Kussmaul, MD;  Location: [place];  Service: General;  Laterality: Right;  . BREAST LUMPECTOMY Left 02/20/2017   left lumpectomy  . BREAST LUMPECTOMY WITH RADIOACTIVE SEED LOCALIZATION Left 02/20/2017   Procedure: LEFT BREAST LUMPECTOMY WITH RADIOACTIVE SEED LOCALIZATION;  Surgeon: Jovita Kussmaul, MD;  Location: [place];  Service: General;  Laterality: Left;  . CATARACT EXTRACTION  2010   Dr. [name]  . CHOLECYSTECTOMY  1992  . COLONOSCOPY  2008  . MOHS SURGERY  2012   on face [facility] [place]  . NAILBED REPAIR  12/05/2010   Procedure: NAILBED REPAIR;  Surgeon: Cammie Sickle., MD;  Location: [place];  Service: Orthopedics;  Laterality: Right;  full thickness nail biopsy right hallux  . torn tenden  2012   hand Dr. Orie Rout    I have reviewed the social history and family history with the patient and they are unchanged from previous note.  ALLERGIES:  is allergic to doxycycline; macrodantin [nitrofurantoin]; and sulfamethoxazole-trimethoprim.  MEDICATIONS:  Current Outpatient Medications  Medication Sig Dispense Refill  .  aspirin 81 MG tablet Take 81 mg by mouth daily.      . calcium carbonate (OSCAL) 1500 (600 Ca) MG TABS tablet Take 600 mg of elemental calcium by mouth daily.    . cholecalciferol (VITAMIN D) 1000 UNITS tablet Take 1,000 Units by mouth daily.    [name] docusate sodium (COLACE) 250 MG capsule Take 250 mg by mouth daily.    [name] gabapentin (NEURONTIN) 100 MG capsule Take 100 mg by mouth 2 (two) times daily. Take 1 tablet in the am and 3 tablets in the pm     . Glucosamine-Chondroitin 750-600 MG TABS Take 1 tablet by mouth. Take one tablet daily    . hydrochlorothiazide (HYDRODIURIL) 25 MG  tablet TAKE 1 TABLET BY MOUTH EVERY DAY 90 tablet 1  . hydroxyurea (HYDREA) 500 MG capsule Take 2 capsules (1,000 mg total) by mouth daily. May take with food to minimize GI side effects. 60 capsule 3  . ibuprofen (ADVIL,MOTRIN) 200 MG tablet Take 200 mg by mouth as needed.    . Magnesium 500 MG TABS Take 1 tablet by mouth daily.    . Omega-3 Fatty Acids (FISH OIL PO) Take by mouth.    [name] omeprazole (PRILOSEC) 20 MG capsule TAKE 1 CAPSULE BY MOUTH EVERY DAY 90 capsule 0  . polyethylene glycol (MIRALAX / GLYCOLAX) packet Take 17 g by mouth daily.      [name] pyridOXINE (VITAMIN B-6) 100 MG tablet Take 100 mg by mouth daily.    . solifenacin (VESICARE) 10 MG tablet Take 0.5 tablets (5 mg total) by mouth daily. 30 tablet 2   No current facility-administered medications for this visit.     PHYSICAL EXAMINATION: ECOG PERFORMANCE STATUS: 0 - Asymptomatic  Vitals:   08/20/18 1010  BP: 130/76  Pulse: 71  Resp: 18  Temp: 98.5 F (36.9 C)  SpO2: 97%   Filed Weights   08/20/18 1010  Weight: 127 lb 11.2 oz (57.9 kg)    GENERAL:alert, no distress and comfortable SKIN: skin color, texture, turgor are normal, no rashes or significant lesions EYES: normal, Conjunctiva are pink and non-injected, sclera clear  NECK: supple, thyroid normal size, non-tender, without nodularity LYMPH:  no palpable lymphadenopathy in the cervical, axillary  LUNGS: clear to auscultation and percussion with normal breathing effort HEART: regular rate & rhythm and no murmurs and no lower extremity edema ABDOMEN:abdomen soft, non-tender and normal bowel sounds Musculoskeletal:no cyanosis of digits and no clubbing  NEURO: alert & oriented x 3 with fluent speech, no focal motor/sensory deficits BREAST: S/p b/l lumpectomy: Surgical incisions healed well (+) 0.5x1cm oval shaped nodule in right breast incision. No adenopathy bilaterally.  LABORATORY DATA:  I have reviewed the data as listed CBC Latest Ref Rng & Units  08/20/2018 05/20/2018 03/25/2018  WBC 4.0 - 10.5 K/uL 4.2 5.3 4.5  Hemoglobin 12.0 - 15.0 g/dL 13.1 13.1 15.1(H)  Hematocrit 36.0 - 46.0 % 37.9 38.8 45.0  Platelets 150 - 400 K/uL 467(H) 481(H) 459(H)     CMP Latest Ref Rng & Units 08/20/2018 05/20/2018 03/25/2018  Glucose 70 - 99 mg/dL 92 91 93  BUN 8 - 23 mg/dL _0 Creatinine 0.44 - 1.00 mg/dL 0.87 0.84 0.78  Sodium 135 - 145 mmol/L 132(L) 135 137  Potassium 3.5 - 5.1 mmol/L 4.0 4.0 3.8  Chloride 98 - 111 mmol/L 94(L) 98 100  CO2 22 - 32 mmol/L _1 Calcium 8.9 - 10.3 mg/dL 8.9 9.3 8.7(L)  Total Protein  6.5 - 8.1 g/dL 6.7 7.0 7.0  Total Bilirubin 0.3 - 1.2 mg/dL 0.6 0.5 0.7  Alkaline Phos 38 - 126 U/L 50 52 62  AST 15 - 41 U/L _0 ALT 0 - 44 U/L 26 35 33      RADIOGRAPHIC STUDIES: I have personally reviewed the radiological images as listed and agreed with the findings in the report. No results found.   ASSESSMENT & PLAN:  Jill Myers is a 85 y.o. female with   1. Bilateral breast cancer, invasive ductal carcinoma, right upper inner quadrant, pT2N0M0, stage IA, ER+/PR+/HER2-, G3, left upper-outer quadrant,pT1cN0M0, stage IA, ER+/PR+/HER2-, G2 -She was diagnosed on 01/20/2017, and is s/pbilateral lumpectomy with negative margins,followed by adjuvant right breastand axillaryradiation. -She startedadjuvantAnastrozole on 1/2019but held around 01/2018 due to bilateral hip pain. Her joint pain is now resolved. She started Exemestane in 03/2017, but stopped after 1 week due to gluteal pain.  -Given her very early stage breast cancer and her age, poor tolerance, will hold adjuvant antiestrogen therapy  -She is clinically doing well. Lab reviewed, her CBC and CMP WNL except MCV 118.8, PLT 467K, lymphocytes 0.5. There is no clinical concern for recurrence.  -Her exam today shows 0.5x1cm nodule in her right breast incision. Per patient this is new. This was not present on her 02/2018 mammogram. I recommend [place] to further  evaluate. She is agreeable.  -Will continuecontinue breast cancer surveillance, with annual mammogram, self-exam, and routine follow-up with lab. Next mammogram in 02/2019 -f/u in 6 months.   2.Essential thrombocythemia -OnHydrea dose currently 1000 mg= 2 capsules daily  -She is on ASA to reduce risk of thrombosis -Labs today show PLT 467K. Goal to keep plt<500k -We will continue Hydrea 1000 mg daily. Tolerating well.  -Monitor labs every 3-4 months with PCP or in clinic.   3. Joint pain, Morton's neuroma of third interspaces of both feet,Restless leg syndrome  -Any execrated joint pain from AI improved since stopping it -She continues to take Gabapentin 320m at night, prn Ibuprofen. -She is currently undergoing PT for her restless leg, feet pain and her neck pain.   4. Osteopenia  -She had a DEXA on 02/12/2018 and it showed aT-score of -2.2 at right hip. Her overall fracture risk of 16%.  -to strengthen her bone, she opted for Zometa infusion. Will start today (08/20/18) and continue q621monthfor 2 years. I encouraged her to watch for bone pain. She can take ibuprofen.  -I encouraged her to maintain dental health and hygiene.  -I encouraged her to continue her calcium and vitDsupplement  5. HTN, GERD -on HCTZ, Omeprazole  -F/u PCP  -BP normal today (08/20/18)   PLAN: -Labs reviewed, proceed with first dose Zometa today and continue every 6 months  -Right breast and axillary [place] 2-3 weeks  -Mammogram in 02/2019 -Lab, f/u and Zometa in 6 months    No problem-specific Assessment & Plan notes found for this encounter.   No orders of the defined types were placed in this encounter.  All questions were answered. The patient knows to call the clinic with any problems, questions or concerns. No barriers to learning was detected. I spent 20 minutes counseling the patient face to face. The total time spent in the appointment was 25 minutes and more than 50% was on counseling  and review of test results     YaTruitt MerleMD 08/20/2018   I, AmJoslyn Devonam acting as scribe for YaTruitt MerleMD.  I have reviewed the above documentation for accuracy and completeness, and I agree with the above.       

## 2018-08-19 ENCOUNTER — Other Ambulatory Visit: Payer: Self-pay

## 2018-08-19 DIAGNOSIS — D473 Essential (hemorrhagic) thrombocythemia: Secondary | ICD-10-CM

## 2018-08-20 ENCOUNTER — Inpatient Hospital Stay: Payer: Medicare Other

## 2018-08-20 ENCOUNTER — Other Ambulatory Visit: Payer: Self-pay

## 2018-08-20 ENCOUNTER — Inpatient Hospital Stay: Payer: Medicare Other | Admitting: Hematology

## 2018-08-20 ENCOUNTER — Encounter: Payer: Self-pay | Admitting: Hematology

## 2018-08-20 ENCOUNTER — Inpatient Hospital Stay: Payer: Medicare Other | Attending: Hematology

## 2018-08-20 ENCOUNTER — Telehealth: Payer: Self-pay | Admitting: Hematology

## 2018-08-20 VITALS — BP 130/76 | HR 71 | Temp 98.5°F | Resp 18 | Ht 63.0 in | Wt 127.7 lb

## 2018-08-20 DIAGNOSIS — Z791 Long term (current) use of non-steroidal anti-inflammatories (NSAID): Secondary | ICD-10-CM | POA: Diagnosis not present

## 2018-08-20 DIAGNOSIS — E559 Vitamin D deficiency, unspecified: Secondary | ICD-10-CM | POA: Insufficient documentation

## 2018-08-20 DIAGNOSIS — M542 Cervicalgia: Secondary | ICD-10-CM | POA: Diagnosis not present

## 2018-08-20 DIAGNOSIS — C50412 Malignant neoplasm of upper-outer quadrant of left female breast: Secondary | ICD-10-CM | POA: Diagnosis not present

## 2018-08-20 DIAGNOSIS — I10 Essential (primary) hypertension: Secondary | ICD-10-CM | POA: Diagnosis not present

## 2018-08-20 DIAGNOSIS — M85851 Other specified disorders of bone density and structure, right thigh: Secondary | ICD-10-CM

## 2018-08-20 DIAGNOSIS — G2581 Restless legs syndrome: Secondary | ICD-10-CM | POA: Insufficient documentation

## 2018-08-20 DIAGNOSIS — Z923 Personal history of irradiation: Secondary | ICD-10-CM | POA: Diagnosis not present

## 2018-08-20 DIAGNOSIS — G5760 Lesion of plantar nerve, unspecified lower limb: Secondary | ICD-10-CM | POA: Insufficient documentation

## 2018-08-20 DIAGNOSIS — C50211 Malignant neoplasm of upper-inner quadrant of right female breast: Secondary | ICD-10-CM | POA: Diagnosis not present

## 2018-08-20 DIAGNOSIS — G8929 Other chronic pain: Secondary | ICD-10-CM | POA: Insufficient documentation

## 2018-08-20 DIAGNOSIS — Z170 Estrogen receptor positive status [ER+]: Secondary | ICD-10-CM

## 2018-08-20 DIAGNOSIS — M8580 Other specified disorders of bone density and structure, unspecified site: Secondary | ICD-10-CM | POA: Insufficient documentation

## 2018-08-20 DIAGNOSIS — K219 Gastro-esophageal reflux disease without esophagitis: Secondary | ICD-10-CM | POA: Insufficient documentation

## 2018-08-20 DIAGNOSIS — D473 Essential (hemorrhagic) thrombocythemia: Secondary | ICD-10-CM | POA: Insufficient documentation

## 2018-08-20 DIAGNOSIS — Z79811 Long term (current) use of aromatase inhibitors: Secondary | ICD-10-CM | POA: Diagnosis not present

## 2018-08-20 DIAGNOSIS — Z7982 Long term (current) use of aspirin: Secondary | ICD-10-CM | POA: Diagnosis not present

## 2018-08-20 DIAGNOSIS — Z79899 Other long term (current) drug therapy: Secondary | ICD-10-CM | POA: Insufficient documentation

## 2018-08-20 LAB — CBC WITH DIFFERENTIAL (CANCER CENTER ONLY)
Abs Immature Granulocytes: 0.01 10*3/uL (ref 0.00–0.07)
Basophils Absolute: 0 10*3/uL (ref 0.0–0.1)
Basophils Relative: 1 %
Eosinophils Absolute: 0.1 10*3/uL (ref 0.0–0.5)
Eosinophils Relative: 1 %
HCT: 37.9 % (ref 36.0–46.0)
Hemoglobin: 13.1 g/dL (ref 12.0–15.0)
Immature Granulocytes: 0 %
Lymphocytes Relative: 13 %
Lymphs Abs: 0.5 10*3/uL — ABNORMAL LOW (ref 0.7–4.0)
MCH: 41.1 pg — ABNORMAL HIGH (ref 26.0–34.0)
MCHC: 34.6 g/dL (ref 30.0–36.0)
MCV: 118.8 fL — ABNORMAL HIGH (ref 80.0–100.0)
Monocytes Absolute: 0.7 10*3/uL (ref 0.1–1.0)
Monocytes Relative: 16 %
Neutro Abs: 2.9 10*3/uL (ref 1.7–7.7)
Neutrophils Relative %: 69 %
Platelet Count: 467 10*3/uL — ABNORMAL HIGH (ref 150–400)
RBC: 3.19 MIL/uL — ABNORMAL LOW (ref 3.87–5.11)
RDW: 12.6 % (ref 11.5–15.5)
WBC Count: 4.2 10*3/uL (ref 4.0–10.5)
nRBC: 0 % (ref 0.0–0.2)

## 2018-08-20 LAB — CMP (CANCER CENTER ONLY)
ALT: 26 U/L (ref 0–44)
AST: 22 U/L (ref 15–41)
Albumin: 4 g/dL (ref 3.5–5.0)
Alkaline Phosphatase: 50 U/L (ref 38–126)
Anion gap: 10 (ref 5–15)
BUN: 16 mg/dL (ref 8–23)
CO2: 28 mmol/L (ref 22–32)
Calcium: 8.9 mg/dL (ref 8.9–10.3)
Chloride: 94 mmol/L — ABNORMAL LOW (ref 98–111)
Creatinine: 0.87 mg/dL (ref 0.44–1.00)
GFR, Est AFR Am: >60 mL/min (ref >60)
GFR, Estimated: >60 mL/min (ref >60)
Glucose, Bld: 92 mg/dL (ref 70–99)
Potassium: 4 mmol/L (ref 3.5–5.1)
Sodium: 132 mmol/L — ABNORMAL LOW (ref 135–145)
Total Bilirubin: 0.6 mg/dL (ref 0.3–1.2)
Total Protein: 6.7 g/dL (ref 6.5–8.1)

## 2018-08-20 MED ORDER — SODIUM CHLORIDE 0.9 % IV SOLN
Freq: Once | INTRAVENOUS | Status: AC
  Administered 2018-08-20: 11:00:00 via INTRAVENOUS
  Filled 2018-08-20: qty 250

## 2018-08-20 MED ORDER — ZOLEDRONIC ACID 4 MG/100ML IV SOLN
4.0000 mg | Freq: Once | INTRAVENOUS | Status: AC
  Administered 2018-08-20: 4 mg via INTRAVENOUS
  Filled 2018-08-20: qty 100

## 2018-08-20 NOTE — Telephone Encounter (Signed)
Scheduled appt per 8/14 los. ° °Left a voice message of appt date and time. °

## 2018-08-20 NOTE — Patient Instructions (Signed)
Zoledronic Acid injection (Hypercalcemia, Oncology) What is this medicine? ZOLEDRONIC ACID (ZOE le dron ik AS id) lowers the amount of calcium loss from bone. It is used to treat too much calcium in your blood from cancer. It is also used to prevent complications of cancer that has spread to the bone. This medicine may be used for other purposes; ask your health care provider or pharmacist if you have questions. COMMON BRAND NAME(S): Zometa What should I tell my health care provider before I take this medicine? They need to know if you have any of these conditions:  aspirin-sensitive asthma  cancer, especially if you are receiving medicines used to treat cancer  dental disease or wear dentures  infection  kidney disease  receiving corticosteroids like dexamethasone or prednisone  an unusual or allergic reaction to zoledronic acid, other medicines, foods, dyes, or preservatives  pregnant or trying to get pregnant  breast-feeding How should I use this medicine? This medicine is for infusion into a vein. It is given by a health care professional in a hospital or clinic setting. Talk to your pediatrician regarding the use of this medicine in children. Special care may be needed. Overdosage: If you think you have taken too much of this medicine contact a poison control center or emergency room at once. NOTE: This medicine is only for you. Do not share this medicine with others. What if I miss a dose? It is important not to miss your dose. Call your doctor or health care professional if you are unable to keep an appointment. What may interact with this medicine?  certain antibiotics given by injection  NSAIDs, medicines for pain and inflammation, like ibuprofen or naproxen  some diuretics like bumetanide, furosemide  teriparatide  thalidomide This list may not describe all possible interactions. Give your health care provider a list of all the medicines, herbs, non-prescription  drugs, or dietary supplements you use. Also tell them if you smoke, drink alcohol, or use illegal drugs. Some items may interact with your medicine. What should I watch for while using this medicine? Visit your doctor or health care professional for regular checkups. It may be some time before you see the benefit from this medicine. Do not stop taking your medicine unless your doctor tells you to. Your doctor may order blood tests or other tests to see how you are doing. Women should inform their doctor if they wish to become pregnant or think they might be pregnant. There is a potential for serious side effects to an unborn child. Talk to your health care professional or pharmacist for more information. You should make sure that you get enough calcium and vitamin D while you are taking this medicine. Discuss the foods you eat and the vitamins you take with your health care professional. Some people who take this medicine have severe bone, joint, and/or muscle pain. This medicine may also increase your risk for jaw problems or a broken thigh bone. Tell your doctor right away if you have severe pain in your jaw, bones, joints, or muscles. Tell your doctor if you have any pain that does not go away or that gets worse. Tell your dentist and dental surgeon that you are taking this medicine. You should not have major dental surgery while on this medicine. See your dentist to have a dental exam and fix any dental problems before starting this medicine. Take good care of your teeth while on this medicine. Make sure you see your dentist for regular follow-up   appointments. What side effects may I notice from receiving this medicine? Side effects that you should report to your doctor or health care professional as soon as possible:  allergic reactions like skin rash, itching or hives, swelling of the face, lips, or tongue  anxiety, confusion, or depression  breathing problems  changes in vision  eye  pain  feeling faint or lightheaded, falls  jaw pain, especially after dental work  mouth sores  muscle cramps, stiffness, or weakness  redness, blistering, peeling or loosening of the skin, including inside the mouth  trouble passing urine or change in the amount of urine Side effects that usually do not require medical attention (report to your doctor or health care professional if they continue or are bothersome):  bone, joint, or muscle pain  constipation  diarrhea  fever  hair loss  irritation at site where injected  loss of appetite  nausea, vomiting  stomach upset  trouble sleeping  trouble swallowing  weak or tired This list may not describe all possible side effects. Call your doctor for medical advice about side effects. You may report side effects to FDA at 1-800-FDA-1088. Where should I keep my medicine? This drug is given in a hospital or clinic and will not be stored at home. NOTE: This sheet is a summary. It may not cover all possible information. If you have questions about this medicine, talk to your doctor, pharmacist, or health care provider.  2020 Elsevier/Gold Standard (2013-05-21 14:19:39)  

## 2018-09-07 ENCOUNTER — Ambulatory Visit
Admission: RE | Admit: 2018-09-07 | Discharge: 2018-09-07 | Disposition: A | Discharge status: Home / Self Care | Payer: Medicare Other | Source: Ambulatory Visit | Attending: Hematology | Admitting: Hematology

## 2018-09-07 ENCOUNTER — Other Ambulatory Visit: Payer: Self-pay

## 2018-09-07 ENCOUNTER — Other Ambulatory Visit: Payer: Self-pay | Admitting: Hematology

## 2018-09-07 DIAGNOSIS — D473 Essential (hemorrhagic) thrombocythemia: Secondary | ICD-10-CM

## 2018-09-07 DIAGNOSIS — C50211 Malignant neoplasm of upper-inner quadrant of right female breast: Secondary | ICD-10-CM

## 2018-09-07 DIAGNOSIS — Z170 Estrogen receptor positive status [ER+]: Secondary | ICD-10-CM

## 2018-09-22 ENCOUNTER — Other Ambulatory Visit: Payer: Self-pay | Admitting: Nurse Practitioner

## 2018-09-23 ENCOUNTER — Ambulatory Visit (INDEPENDENT_AMBULATORY_CARE_PROVIDER_SITE_OTHER): Payer: Medicare Other | Admitting: Sports Medicine

## 2018-09-23 ENCOUNTER — Other Ambulatory Visit: Payer: Self-pay

## 2018-09-23 VITALS — BP 120/70 | Ht 63.0 in | Wt 130.0 lb

## 2018-09-23 DIAGNOSIS — M7741 Metatarsalgia, right foot: Secondary | ICD-10-CM | POA: Diagnosis not present

## 2018-09-23 DIAGNOSIS — M7742 Metatarsalgia, left foot: Secondary | ICD-10-CM | POA: Diagnosis not present

## 2018-09-23 NOTE — Assessment & Plan Note (Signed)
Lack of cushioning due to loss of fat pad causing pressure neuropraxia of interdigital nerves. This has persisted despite custom Smartcell orthotic with metatarsal pad as well as cushioned shoes. Will increase the gabapentin she is currently on for restless leg syndrome (100 mg AM, 300 mg PM) to 100 mg AM, 200 mg midday, and 300 mg PM to see if this improves pain. Also provided information on diabetic shoes for additional cushioning support. In addition, will order a different cushioned insole to see if this helps.

## 2018-09-23 NOTE — Patient Instructions (Signed)
Increase the amount of gabapentin to help foot pain.   Take 1 in the morning Take 2 at lunch Take 3 at night.  We will also order a special cushioned insole to try to help as well.  We hope they feel better!

## 2018-09-23 NOTE — Progress Notes (Signed)
  Jill Myers - 85 y.o. female MRN [id number]  Date of birth: [birth year]  SUBJECTIVE:   CC: bilateral foot pain  Jill Myers is an 85 yo female presenting today for bilateral foot pain, worsening over the past 2 weeks. She has been diagnosed with chronic metatarsalgia and has had custom orthotics made with MT pads bilaterally. She reports that wearing orthotics are no longer helping with pain. She has pain in any shoe type, even well padded shoes. She is only without pain if she is walking barefoot on carpet. She has no burning in toes. She thinks that increase in pain is secondary to increasing her walking mileage to 2 miles daily but recently due to pain she has not been walking at all. No knee, hip, ankle pain. She gets relief with ibuprofen.   ROS: No unexpected weight loss, foot swelling, instability, muscle pain, numbness/tingling, redness, otherwise see HPI   PMHx - Updated and reviewed.  Contributory factors include: Negative PSHx - Updated and reviewed.  Contributory factors include:  Negative FHx - Updated and reviewed.  Contributory factors include:  Negative Social Hx - Updated and reviewed. Contributory factors include: Negative Medications - reviewed   DATA REVIEWED: Prior records  PHYSICAL EXAM:  VS: BP:120/70  HR: bpm  TEMP: ( )  RESP:   HT:5\' 3"  (160 cm)   WT:130 lb (59 kg)  BMI:23.03 PHYSICAL EXAM: Gen: NAD, alert, cooperative with exam, well-appearing HEENT: clear conjunctiva,  CV:  no edema, capillary refill brisk, normal rate Resp: non-labored Skin: no rashes, normal turgor  Neuro: no gross deficits.  Psych:  alert and oriented  Right Foot: Inspection:  Prominent MT heads. TMT arthritis  with nodule over dorsal aspect of foot. Loss of transverse and longitudinal arches. Palpation:TTP over metatarsal heads ROM: Full  ROM of the ankle. Normal midfoot flexibility Strength: 5/5 strength ankle in all planes Neurovascular: N/V intact distally in the lower extremity  Special tests: Pain with forefoot squeeze.  Left Foot: Inspection: Prominent MT heads. TMT arthritis with nodule over dorsal aspect of foot. Loss of transverse and longitudinal arches. Palpation: TTP over metatarsal heads ROM: Full  ROM of the ankle. Normal midfoot flexibility Strength: 5/5 strength ankle in all planes Neurovascular: N/V intact distally in the lower extremity Special tests: Pain with forefoot squeeze  ASSESSMENT & PLAN:   Metatarsalgia of both feet Lack of cushioning due to loss of fat pad causing pressure neuropraxia of interdigital nerves. This has persisted despite custom Smartcell orthotic with metatarsal pad as well as cushioned shoes. Will increase the gabapentin she is currently on for restless leg syndrome (100 mg AM, 300 mg PM) to 100 mg AM, 200 mg midday, and 300 mg PM to see if this improves pain. Also provided information on diabetic shoes for additional cushioning support. In addition, will order a different cushioned insole to see if this helps.  I observed and examined the patient with Dr. Richardson Landry and agree with assessment and plan.  Note reviewed and modified by me. Ila Mcgill, MD

## 2018-09-28 ENCOUNTER — Ambulatory Visit: Payer: Medicare Other | Admitting: Sports Medicine

## 2018-11-08 ENCOUNTER — Telehealth: Payer: Self-pay

## 2018-11-08 NOTE — Telephone Encounter (Signed)
Told pt I would talk with Dr. Oneida Alar tomorrow. She states she isn't sure that the 2 gabapentin she's been taking at lunch for the past few weeks has made much a difference with her foot pain. However, it is helping her sleep better at night. She wants to d/c the 2 pills during the day and continue taking 1 in the morning and 3 at night until she hears back from our office.

## 2018-11-10 ENCOUNTER — Other Ambulatory Visit: Payer: Self-pay | Admitting: Nurse Practitioner

## 2018-11-10 DIAGNOSIS — K219 Gastro-esophageal reflux disease without esophagitis: Secondary | ICD-10-CM

## 2018-11-11 ENCOUNTER — Other Ambulatory Visit: Payer: Self-pay | Admitting: Nurse Practitioner

## 2018-11-11 MED ORDER — GABAPENTIN 300 MG PO CAPS: ORAL_CAPSULE | ORAL | 0 refills | Status: DC

## 2018-11-19 ENCOUNTER — Encounter: Payer: Self-pay | Admitting: Internal Medicine

## 2018-11-19 ENCOUNTER — Other Ambulatory Visit: Payer: Self-pay

## 2018-11-19 ENCOUNTER — Non-Acute Institutional Stay: Payer: Medicare Other | Admitting: Internal Medicine

## 2018-11-19 VITALS — BP 112/60 | HR 80 | Temp 96.8°F | Ht 63.0 in | Wt 127.8 lb

## 2018-11-19 DIAGNOSIS — D473 Essential (hemorrhagic) thrombocythemia: Secondary | ICD-10-CM

## 2018-11-19 DIAGNOSIS — N3941 Urge incontinence: Secondary | ICD-10-CM

## 2018-11-19 DIAGNOSIS — C50211 Malignant neoplasm of upper-inner quadrant of right female breast: Secondary | ICD-10-CM

## 2018-11-19 DIAGNOSIS — I10 Essential (primary) hypertension: Secondary | ICD-10-CM | POA: Diagnosis not present

## 2018-11-19 DIAGNOSIS — G2581 Restless legs syndrome: Secondary | ICD-10-CM

## 2018-11-19 DIAGNOSIS — Z170 Estrogen receptor positive status [ER+]: Secondary | ICD-10-CM

## 2018-11-19 MED ORDER — GABAPENTIN 300 MG PO CAPS: ORAL_CAPSULE | ORAL | 0 refills | Status: DC

## 2018-11-19 NOTE — Progress Notes (Signed)
Location: [place] of Service:  Clinic (12)  Provider:   Code Status:  Goals of Care:  Advanced Directives 06/24/2018  Does Patient Have a Medical Advance Directive? Yes  Type of Advance Directive Living will;Healthcare Power of Attorney  Does patient want to make changes to medical advance directive? No - Patient declined  Copy of Tony in Chart? Yes - validated most recent copy scanned in chart (See row information)  Would patient like information on creating a medical advance directive? -     Chief Complaint  Patient presents with  . Medical Management of Chronic Issues    4 month follow up. She would like to discuss digestion and eliminating. She would also like to discuss her gabapentin prescription.     HPI: Patient is a 85 y.o. female seen today for an Routine Visit  History of Bilateral breast cancer s/p Lumpectomy followed by radiation Follows with Dr. Burr Medico Intolerable to Adjuvant therapy Essential Thrombocythemia On hydrea. Staying stable  H/o Restless Leg On Neurontin Wants prescription renewed Hypertension On HCTZ Osteopenia Last DEXA scan showed T score of -2.2.  She is on Zometa infusion by Dr. Burr Medico GERD On Prilosec History of constipation Takes stool softeners at night and MiraLAX.  Lives in [place] in friends Home Very walks without any cane or walker.  No recent falls.  Independent with ADLs and IADLs and still drives Past Medical History:  Diagnosis Date  . Bladder cystocele 05/21/2010  . Complication of anesthesia    deglutition in history  . Deglutition disorder 08/17/2012   Single episode of choking/aspiration; preceded by trouble swallowing saliva (thin liquids).  For SLP evaluation. Aug 17, 2012.    [name] Disease of blood and blood forming organ 01/05/2007   Qualifier: Diagnosis of  By: Oneida Alar MD, KARL    . Diverticulitis   . DIVERTICULOSIS OF COLON 03/05/2006   Qualifier: Diagnosis of  By: Samara Snide     . DIZZINESS 01/18/2010   Qualifier: Diagnosis of  By: Lindell Noe MD, Jeneen Rinks    . Essential hypertension, benign 02/22/2008   Qualifier: Diagnosis of  By: Lindell Noe MD, Jeneen Rinks     . Essential thrombocythemia ([place]) 01/31/11  . Family history of Alzheimer's disease 11/11/2013   Patient with family history of Alzheimers disease. She herself is very highly functional.  Plans for future visit with MMSE as primary function of the visit.    [name] GERD (gastroesophageal reflux disease) 08/16/2010  . HALLUX VALGUS, ACQUIRED 04/10/2009   Qualifier: Diagnosis of  By: [name] CMA,, [place]    . Hypertension   . HYPERTRIGLYCERIDEMIA 01/05/2007   Qualifier: Diagnosis of  By: Oneida Alar MD, KARL    . Insomnia 02/10/2014  . Left-sided chest wall pain 12/19/2011    L sided chest wall pain follows a dermatomal distribution and raises the possibility of thoracic radiculopathic pain, which would be expected to improve with gabapentin as well. We discussed this at length and she will call to make me aware if this does not get better. Rib belt/girdle in the meantime; may consider rib films or CXR if not improving.     . Metatarsalgia of right foot 11/28/2013   Referral to [facility]   . Osteopenia   . Personal history of radiation therapy    03/2017 right breast  . Restless leg syndrome 06/09/2011  . Restless legs syndrome (RLS)   . URINARY INCONTINENCE, URGE, MILD 11/27/2009   Qualifier: Diagnosis of  By: Zebedee Iba NP,  Clista Bernhardt D deficiency 08/16/2010    Past Surgical History:  Procedure Laterality Date  . BIOPSY BREAST Left 1998   Dr. Rebekah Chesterfield  . BREAST EXCISIONAL BIOPSY    . BREAST LUMPECTOMY Right 02/20/2017   Procedure: RIGHT BREAST LUMPECTOMY;  Surgeon: Jovita Kussmaul, MD;  Location: [place];  Service: General;  Laterality: Right;  . BREAST LUMPECTOMY Left 02/20/2017   left lumpectomy  . BREAST LUMPECTOMY WITH RADIOACTIVE SEED LOCALIZATION Left 02/20/2017   Procedure: LEFT BREAST LUMPECTOMY WITH RADIOACTIVE SEED  LOCALIZATION;  Surgeon: Jovita Kussmaul, MD;  Location: [place];  Service: General;  Laterality: Left;  . CATARACT EXTRACTION  2010   Dr. [name]  . CHOLECYSTECTOMY  1992  . COLONOSCOPY  2008  . MOHS SURGERY  2012   on face [facility] [place]  . NAILBED REPAIR  12/05/2010   Procedure: NAILBED REPAIR;  Surgeon: Cammie Sickle., MD;  Location: [facility];  Service: Orthopedics;  Laterality: Right;  full thickness nail biopsy right hallux  . torn tenden  2012   hand Dr. Orie Rout    Allergies  Allergen Reactions  . Doxycycline Nausea And Vomiting  . Macrodantin [Nitrofurantoin] Nausea And Vomiting  . Sulfamethoxazole-Trimethoprim Rash    REACTION: Rash after completing course of Septra    Outpatient Encounter Medications as of 11/19/2018  Medication Sig  . aspirin 81 MG tablet Take 81 mg by mouth daily.    . calcium carbonate (OSCAL) 1500 (600 Ca) MG TABS tablet Take 600 mg of elemental calcium by mouth daily.  . cholecalciferol (VITAMIN D) 1000 UNITS tablet Take 1,000 Units by mouth daily.  [name] docusate sodium (COLACE) 250 MG capsule Take 250 mg by mouth daily.  [name] gabapentin (NEURONTIN) 300 MG capsule Take one tablet in am, three tables in pm  . Glucosamine-Chondroitin 750-600 MG TABS Take 1 tablet by mouth. Take one tablet daily  . hydrochlorothiazide (HYDRODIURIL) 25 MG tablet TAKE 1 TABLET BY MOUTH EVERY DAY  . hydroxyurea (HYDREA) 500 MG capsule TAKE 2 CAPSULES (1,000 MG TOTAL) BY MOUTH DAILY. MAY TAKE WITH FOOD TO MINIMIZE GI SIDE EFFECTS.  [name] ibuprofen (ADVIL,MOTRIN) 200 MG tablet Take 200 mg by mouth as needed.  . Magnesium 500 MG TABS Take 1 tablet by mouth daily.  . Omega-3 Fatty Acids (FISH OIL PO) Take by mouth.  [name] omeprazole (PRILOSEC) 20 MG capsule TAKE 1 CAPSULE BY MOUTH EVERY DAY  . polyethylene glycol (MIRALAX / GLYCOLAX) packet Take 17 g by mouth daily.    [name] pyridOXINE (VITAMIN B-6) 100 MG tablet Take 100 mg by mouth daily.   . solifenacin (VESICARE) 10 MG tablet TAKE 1/2 TABLET BY MOUTH DAILY  . [DISCONTINUED] gabapentin (NEURONTIN) 300 MG capsule Take one tablet in am, three tables in pm   No facility-administered encounter medications on file as of 11/19/2018.     Review of Systems:  Review of Systems  Constitutional: Negative.   HENT: Negative.   Respiratory: Negative.   Gastrointestinal: Positive for constipation.  Genitourinary: Negative.   Musculoskeletal: Negative.   Neurological: Negative.   Psychiatric/Behavioral: Negative.   All other systems reviewed and are negative.   Health Maintenance  Topic Date Due  . TETANUS/TDAP  04/11/2019  . INFLUENZA VACCINE  Completed  . DEXA SCAN  Completed  . PNA vac Low Risk Adult  Completed    Physical Exam: Vitals:   11/19/18 0947  BP: 112/60  Pulse: 80  Temp: (!) 96.8 F (36 C)  SpO2: 98%  Weight: 127 lb 12.8 oz (58 kg)  Height: 5\' 3"  (1.6 m)   Body mass index is 22.64 kg/m. Physical Exam  Constitutional: Oriented to person, place, and time. Well-developed and well-nourished.  HENT:  Head: Normocephalic.  Mouth/Throat: Oropharynx is clear but dry Eyes: Pupils are equal, round, and reactive to light.  Neck: Neck supple.  Cardiovascular: Normal rate and normal heart sounds.  No murmur heard. Pulmonary/Chest: Effort normal and breath sounds normal. No respiratory distress. No wheezes. She has no rales.  Abdominal: Soft. Bowel sounds are normal. No distension. There is no tenderness. There is no rebound.  Musculoskeletal: No edema.  Lymphadenopathy: none Neurological: Alert and oriented to person, place, and time. Gait was stable. Walks withwith no Cane or walker Skin: Skin is warm and dry.  Psychiatric: Normal mood and affect. Behavior is normal. Thought content normal.    Labs reviewed: Basic Metabolic Panel: Recent Labs    12/10/17 0705  03/25/18 0941 05/20/18 1036 08/20/18 0942  NA 135   < > 137 135 132*  K 4.2   < > 3.8  4.0 4.0  CL 99   < > 100 98 94*  CO2 29   < > 28 30 28   GLUCOSE 103*   < > 93 91 92  BUN 14   < > 15 18 16   CREATININE 0.74   < > 0.78 0.84 0.87  CALCIUM 9.4   < > 8.7* 9.3 8.9  TSH 3.35  --   --   --   --    < > = values in this interval not displayed.   Liver Function Tests: Recent Labs    03/25/18 0941 05/20/18 1036 08/20/18 0942  AST 26 26 22   ALT 33 35 26  ALKPHOS 62 52 50  BILITOT 0.7 0.5 0.6  PROT 7.0 7.0 6.7  ALBUMIN 4.0 4.1 4.0   No results for input(s): LIPASE, AMYLASE in the last 8760 hours. No results for input(s): AMMONIA in the last 8760 hours. CBC: Recent Labs    03/25/18 0941 05/20/18 1036 08/20/18 0942  WBC 4.5 5.3 4.2  NEUTROABS 2.9 3.7 2.9  HGB 15.1* 13.1 13.1  HCT 45.0 38.8 37.9  MCV 104.4* 114.8* 118.8*  PLT 459* 481* 467*   Lipid Panel: Recent Labs    12/10/17 0705  CHOL 154  HDL 45*  LDLCALC 87  TRIG 122  CHOLHDL 3.4   No results found for: HGBA1C  Procedures since last visit: No results found.  Assessment/Plan Essential thrombocythemia ([place]) Follows with Dr Burr Medico On Hydrea  Essential hypertension, benign Continue HCTZ Repeat BMP  Restless leg syndrome On Neurontin 100 mg Qam and 300 mg QHS Prescription renewed for 3 months  Bilateral Breast cancer S/P Lumpectomy and Radiation no on Adjuvant Follows with Oncology  Urinary Incontinence On vesicare low dose Constipation  Continue Miralax    Osteopenia On Zumeta per Oncology     Labs/tests ordered:  * No order type specified * Next appt:  Visit date not found  Total time spent in this patient care encounter was  40_  minutes; greater than 50% of the visit spent counseling patient and staff, reviewing records , Labs and coordinating care for problems addressed at this encounter.

## 2019-01-19 ENCOUNTER — Other Ambulatory Visit: Payer: Self-pay | Admitting: Hematology

## 2019-01-19 DIAGNOSIS — Z9889 Other specified postprocedural states: Secondary | ICD-10-CM

## 2019-01-19 DIAGNOSIS — Z853 Personal history of malignant neoplasm of breast: Secondary | ICD-10-CM

## 2019-02-14 ENCOUNTER — Other Ambulatory Visit: Payer: Self-pay

## 2019-02-14 ENCOUNTER — Ambulatory Visit
Admission: RE | Admit: 2019-02-14 | Discharge: 2019-02-14 | Disposition: A | Discharge status: Home / Self Care | Payer: Medicare PPO | Source: Ambulatory Visit | Attending: Hematology | Admitting: Hematology

## 2019-02-14 DIAGNOSIS — Z853 Personal history of malignant neoplasm of breast: Secondary | ICD-10-CM | POA: Diagnosis not present

## 2019-02-14 DIAGNOSIS — R928 Other abnormal and inconclusive findings on diagnostic imaging of breast: Secondary | ICD-10-CM | POA: Diagnosis not present

## 2019-02-14 NOTE — Progress Notes (Signed)
[name]   [phone] [phone]   Clinic Follow up Note   Patient Care Team: Mast, [name], NP as PCP - General (Internal Medicine) [name], MD as Consulting Physician (Dermatology) [name], MD as Consulting Physician ([place]) Jovita Kussmaul, MD as Consulting Physician (General Surgery) Raye Sorrow, MD as Consulting Physician (Pulmonary Disease) Truitt Merle, MD as Consulting Physician (Hematology) Gery Pray, MD as Consulting Physician (Radiation Oncology) Delice Bison, Charlestine Massed, NP as Nurse Practitioner (Hematology and Oncology) Annia Belt, MD as Consulting Physician (Oncology)  Date of Service:  02/21/2019  CHIEF COMPLAINT: Follow up malignant neoplasm of right and left breastand ET  SUMMARY OF ONCOLOGIC HISTORY: Oncology History Overview Note  Cancer Staging Malignant neoplasm of upper-inner quadrant of right breast in female, estrogen receptor positive ([place]) Staging form: Breast, AJCC 8th Edition - Clinical stage from 01/07/2017: Stage IA (cT1c, cN0, cM0, G3, ER: Positive, PR: Positive, HER2: Negative) - Signed by Truitt Merle, MD on 01/20/2017  Malignant neoplasm of upper-outer quadrant of left breast in female, estrogen receptor positive ([place]) Staging form: Breast, AJCC 8th Edition - Clinical stage from 01/07/2017: Stage IA (cT1b, cN0, cM0, G2, ER: Positive, PR: Positive, HER2: Negative) - Signed by Truitt Merle, MD on 01/20/2017     Malignant neoplasm of upper-inner quadrant of right breast in female, estrogen receptor positive ([place])  01/01/2017 Mammogram   IMPRESSION: 1. 2.0 cm mass in the 2 o'clock position of the right breast with imaging features highly suspicious for malignancy. 2. 0.7 cm mass in the 2 o'clock position of the left breast with imaging features highly suspicious for malignancy. 3. No abnormal appearing axillary lymph nodes on either side.   01/07/2017 Receptors her2   Right Breast:  Prognostic indicators significant for: ER, 95% positive and PR, 50% positive, both with strong staining intensity. Proliferation marker Ki67 at 30. HER2 negative. RATIO OF HER2/CEP17 SIGNALS 0.81 AVERAGE HER2 COPY NUMBER PER CELL 1.70  Left Breast: Prognostic indicators significant for: ER, 100% positive and PR, 100% positive, both with strong staining intensity. Proliferation marker Ki67 at 10. HER2 negative. RATIO OF HER2/CEP17 SIGNALS 1.75 AVERAGE HER2 COPY NUMBER PER CELL 3.15   01/07/2017 Initial Biopsy   Diagnosis 1. Breast, right, needle core biopsy, 2:00 o'clock - INVASIVE DUCTAL CARCINOMA, SEE COMMENT. 2. Breast, left, needle core biopsy, 2:00 o'clock - INVASIVE MAMMARY CARCINOMA, SEE COMMENT.    01/20/2017 Initial Diagnosis   Malignant neoplasm of upper-inner quadrant of right breast in female, estrogen receptor positive (Jill Myers)   01/2017 - 03/2018 Anti-estrogen oral therapy   Anastrozole 1 mg daily started on 01/2017, stopped around 01/2018 due to diffuse joint pain. She switched to Exemestane on 03/26/2018 but stopped after 1 week due to gluteal pain.    02/20/2017 Surgery   RIGHT BREAST LUMPECTOMY (Right) LEFT BREAST LUMPECTOMY WITH RADIOACTIVE SEED LOCALIZATION (Left)  Per Dr. Marlou Starks    02/20/2017 Pathology Results    Diagnosis 1. Breast, lumpectomy, Left w/ seed - INVASIVE DUCTAL CARCINOMA, GRADE I/III, SPANNING 1.1 CM. - THE SURGICAL RESECTION MARGINS ARE NEGATIVE FOR CARCINOMA. - SEE ONCOLOGY TABLE BELOW. 2. Breast, lumpectomy, Right - INVASIVE DUCTAL CARCINOMA, GRADE II/III, SPANNING 2.1 CM. - DUCTAL CARCINOMA IN SITU, INTERMEDIATE GRADE. - PERINEURAL INVASION IS IDENTIFIED. - THE SURGICAL RESECTION MARGINS ARE NEGATIVE FOR CARCINOMA. - SEE ONCOLOGY TABLE BELOW. 3. Breast, excision, Right inferior - LOBULAR NEOPLASIA (ATYPICAL LOBULAR HYPERPLASIA). - FAT NECROSIS. - SEE COMMENT.  Microscopic Comment 1. BREAST, INVASIVE TUMOR (LEFT) Procedure:  Seed localized  lumpectomy Laterality: Left Tumor Size: 1.1 cm (gross measurement). Histologic Type: Ductal Grade: I Tubular Differentiation: 2 Nuclear Pleomorphism: 1 Mitotic Count: 1 Ductal Carcinoma in Situ (DCIS): Not identified Extent of Tumor: Confined to breast parenchyma. Margins: Greater than 0.2 cm to all margins. Regional Lymph Nodes: None examined. Breast Prognostic Profile: Case [id number] Estrogen Receptor: 100%, strong Progesterone Receptor: 100%, strong Her2: No amplification was detected. The ratio was 0.81 Ki-67: 10% Best tumor block for sendout testing: 1B Pathologic Stage Classification (pTNM, AJCC 8th Edition): Primary Tumor (pT): pT1c Regional Lymph Nodes (pN): pNX Distant Metastases (pM): pMX  2. BREAST, INVASIVE TUMOR (RIGHT) Procedure: Lumpectomy Laterality: Right Tumor Size: 2.1 cm (gross measurement) Histologic Type: Ductal Grade: II Tubular Differentiation: 3 Nuclear Pleomorphism: 2 Mitotic Count: 1 Ductal Carcinoma in Situ (DCIS): Present, intermediate grade Extent of Tumor: Confined to breast parenchyma. Margins: Greater than 0.2 cm to all margins Regional Lymph Nodes: None examined. Breast Prognostic Profile: Case [id number] Estrogen Receptor: 95%, strong Progesterone Receptor: 50%, strong Her2: No amplification was detected. The ratio was 0.81 Ki-67: 30% Best tumor block for sendout testing: 2A Pathologic Stage Classification (pTNM, AJCC 8th Edition): Primary Tumor (pT): pT2 Regional Lymph Nodes (pN): pNX Distant Metastases (pM): pMX 3. The surgical resection margin(s) of the specimen were inked and microscopically evaluated.   03/31/2017 - 05/11/2017 Radiation Therapy   1. Right Breast / 50 Gy in 25 fractions 2. Right Axilla / 45 Gy in 25 fractions 3. Right Breast Boost / 10 Gy in 5 fractions   Malignant neoplasm of upper-outer quadrant of left breast in female, estrogen receptor positive (Jill Myers)  01/20/2017 Initial Diagnosis   Malignant  neoplasm of upper-outer quadrant of left breast in female, estrogen receptor positive (Jill Myers)   02/20/2017 Surgery   RIGHT BREAST LUMPECTOMY (Right) LEFT BREAST LUMPECTOMY WITH RADIOACTIVE SEED LOCALIZATION (Left)  Per Dr. Marlou Starks     02/12/2018 Mammogram   IMPRESSION: No mammographic evidence of breast malignancy       CURRENT THERAPY:  -Breast Cancer Surveillance -Hydrea 1083m daily, dose increased in Jan 2020 -Zometa every 6 months for 2 years starting8/14/20  INTERVAL HISTORY:  LLake Breedingis here for a follow up ET and breast cancer. She was last seen by me 6 months ago. She presents to the clinic alone. She notes she is doing fair. She notes worsened arthritic pain lately. She has been using Glucosamine OTC. She is exercising with sit ups, weight lifting, yoga and cardio and eating well. She notes due to foot pain she does not walk to much more than 1 mile and stairs. She also notes knee pain so she does not take stairs down all the time. She notes her last mammogram was painful.   She notes she is on Vesicare for her urinary incontinence but this does cost a lot. I reviewed her medication list. She is on hydrea 10079mdaily. She does not miss a dose. She notes she tolerates her hydrea well.    REVIEW OF SYSTEMS:   Constitutional: Denies fevers, chills or abnormal weight loss Eyes: Denies blurriness of vision Ears, nose, mouth, throat, and face: Denies mucositis or sore throat Respiratory: Denies cough, dyspnea or wheezes Cardiovascular: Denies palpitation, chest discomfort or lower extremity swelling Gastrointestinal:  Denies nausea, heartburn or change in bowel habits Skin: Denies abnormal skin rashes MSK: (+) Arthritic pain of right foot and right hip and knee Lymphatics: Denies new lymphadenopathy or easy bruising Neurological:Denies numbness, tingling or new weaknesses Behavioral/Psych: Mood  is stable, no new changes  All other systems were reviewed with the  patient and are negative.  MEDICAL HISTORY:  Past Medical History:  Diagnosis Date  . Bladder cystocele 05/21/2010  . Complication of anesthesia    deglutition in history  . Deglutition disorder 08/17/2012   Single episode of choking/aspiration; preceded by trouble swallowing saliva (thin liquids).  For SLP evaluation. Aug 17, 2012.    [name] Disease of blood and blood forming organ 01/05/2007   Qualifier: Diagnosis of  By: Oneida Alar MD, KARL    . Diverticulitis   . DIVERTICULOSIS OF COLON 03/05/2006   Qualifier: Diagnosis of  By: Samara Snide    . DIZZINESS 01/18/2010   Qualifier: Diagnosis of  By: Lindell Noe MD, Jeneen Rinks    . Essential hypertension, benign 02/22/2008   Qualifier: Diagnosis of  By: Lindell Noe MD, Jeneen Rinks     . Essential thrombocythemia ([place]) 01/31/11  . Family history of Alzheimer's disease 11/11/2013   Patient with family history of Alzheimers disease. She herself is very highly functional.  Plans for future visit with MMSE as primary function of the visit.    [name] GERD (gastroesophageal reflux disease) 08/16/2010  . HALLUX VALGUS, ACQUIRED 04/10/2009   Qualifier: Diagnosis of  By: [name] CMA,, [place]    . Hypertension   . HYPERTRIGLYCERIDEMIA 01/05/2007   Qualifier: Diagnosis of  By: Oneida Alar MD, KARL    . Insomnia 02/10/2014  . Left-sided chest wall pain 12/19/2011    L sided chest wall pain follows a dermatomal distribution and raises the possibility of thoracic radiculopathic pain, which would be expected to improve with gabapentin as well. We discussed this at length and she will call to make me aware if this does not get better. Rib belt/girdle in the meantime; may consider rib films or CXR if not improving.     . Metatarsalgia of right foot 11/28/2013   Referral to [facility]   . Osteopenia   . Personal history of radiation therapy    03/2017 right breast  . Restless leg syndrome 06/09/2011  . Restless legs syndrome (RLS)   . URINARY INCONTINENCE, URGE, MILD 11/27/2009   Qualifier: Diagnosis of  By:  Zebedee Iba NP, Manuela Schwartz    . Vitamin D deficiency 08/16/2010    SURGICAL HISTORY: Past Surgical History:  Procedure Laterality Date  . BIOPSY BREAST Left 1998   Dr. Rebekah Chesterfield  . BREAST EXCISIONAL BIOPSY    . BREAST LUMPECTOMY Right 02/20/2017   Procedure: RIGHT BREAST LUMPECTOMY;  Surgeon: Jovita Kussmaul, MD;  Location: [facility];  Service: General;  Laterality: Right;  . BREAST LUMPECTOMY Left 02/20/2017   left lumpectomy  . BREAST LUMPECTOMY WITH RADIOACTIVE SEED LOCALIZATION Left 02/20/2017   Procedure: LEFT BREAST LUMPECTOMY WITH RADIOACTIVE SEED LOCALIZATION;  Surgeon: Jovita Kussmaul, MD;  Location: [facility];  Service: General;  Laterality: Left;  . CATARACT EXTRACTION  2010   Dr. [name]  . CHOLECYSTECTOMY  1992  . COLONOSCOPY  2008  . MOHS SURGERY  2012   on face [facility] [place]  . NAILBED REPAIR  12/05/2010   Procedure: NAILBED REPAIR;  Surgeon: Cammie Sickle., MD;  Location: [facility];  Service: Orthopedics;  Laterality: Right;  full thickness nail biopsy right hallux  . torn tenden  2012   hand Dr. Orie Rout    I have reviewed the social history and family history with the patient and they are unchanged from previous note.  ALLERGIES:  is allergic to  doxycycline; macrodantin [nitrofurantoin]; and sulfamethoxazole-trimethoprim.  MEDICATIONS:  Current Outpatient Medications  Medication Sig Dispense Refill  . aspirin 81 MG tablet Take 81 mg by mouth daily.      . calcium carbonate (OSCAL) 1500 (600 Ca) MG TABS tablet Take 600 mg of elemental calcium by mouth daily.    . cholecalciferol (VITAMIN D) 1000 UNITS tablet Take 1,000 Units by mouth daily.    [name] gabapentin (NEURONTIN) 300 MG capsule Take one tablet in am, three tables in pm 360 capsule 2  . hydrochlorothiazide (HYDRODIURIL) 25 MG tablet TAKE 1 TABLET BY MOUTH EVERY DAY 90 tablet 1  . hydroxyurea (HYDREA) 500 MG capsule Take 2 capsules (1,000 mg total) by mouth  daily. May take with food to minimize GI side effects. Take extra 1 tab at evening on Mondays and Thursdays 210 capsule 1  . ibuprofen (ADVIL,MOTRIN) 200 MG tablet Take 200 mg by mouth as needed.    . Magnesium 500 MG TABS Take 1 tablet by mouth daily.    . Omega-3 Fatty Acids (FISH OIL PO) Take by mouth.    [name] omeprazole (PRILOSEC) 20 MG capsule TAKE 1 CAPSULE BY MOUTH EVERY DAY 90 capsule 1  . polyethylene glycol (MIRALAX / GLYCOLAX) packet Take 17 g by mouth daily.      [name] pyridOXINE (VITAMIN B-6) 100 MG tablet Take 100 mg by mouth daily.    . solifenacin (VESICARE) 5 MG tablet Take 1 tablet (5 mg total) by mouth daily. Please send Versicare 60m x 30, brand name product only 30 tablet 2  . docusate sodium (COLACE) 250 MG capsule Take 250 mg by mouth daily.    . Glucosamine-Chondroitin 750-600 MG TABS Take 2 tablets by mouth. Take two tablet daily     No current facility-administered medications for this visit.    PHYSICAL EXAMINATION: ECOG PERFORMANCE STATUS: 1 - Symptomatic but completely ambulatory  Vitals:   02/21/19 1014  BP: (!) 164/81  Pulse: 73  Resp: 17  Temp: 97.8 F (36.6 C)  SpO2: 98%   Filed Weights   02/21/19 1014  Weight: 129 lb 3.2 oz (58.6 kg)    GENERAL:alert, no distress and comfortable SKIN: skin color, texture, turgor are normal, no rashes or significant lesions EYES: normal, Conjunctiva are pink and non-injected, sclera clear  NECK: supple, thyroid normal size, non-tender, without nodularity LYMPH:  no palpable lymphadenopathy in the cervical, axillary  LUNGS: clear to auscultation and percussion with normal breathing effort HEART: regular rate & rhythm and no murmurs and no lower extremity edema ABDOMEN:abdomen soft, non-tender and normal bowel sounds Musculoskeletal:no cyanosis of digits and no clubbing  NEURO: alert & oriented x 3 with fluent speech, no focal motor/sensory deficits BREAST: s/p b/l lumpectomy: Surgical incision healed well with mild  left breast scar tissue. No palpable mass, nodules or adenopathy bilaterally. Breast exam benign.   LABORATORY DATA:  I have reviewed the data as listed CBC Latest Ref Rng & Units 02/15/2019 08/20/2018 05/20/2018  WBC 3.8 - 10.8 Thousand/uL 4.8 4.2 5.3  Hemoglobin 11.7 - 15.5 g/dL 13.3 13.1 13.1  Hematocrit 35.0 - 45.0 % 38.2 37.9 38.8  Platelets 140 - 400 Thousand/uL 562(H) 467(H) 481(H)     CMP Latest Ref Rng & Units 02/21/2019 02/15/2019 08/20/2018  Glucose 70 - 99 mg/dL 82 90 92  BUN 8 - 23 mg/dL 10 14 16   Creatinine 0.44 - 1.00 mg/dL 0.74 0.74 0.87  Sodium 135 - 145 mmol/L 135 134(L) 132(L)  Potassium 3.5 - 5.1  mmol/L 3.2(L) 4.2 4.0  Chloride 98 - 111 mmol/L 96(L) 97(L) 94(L)  CO2 22 - 32 mmol/L 29 29 28   Calcium 8.9 - 10.3 mg/dL 8.8(L) 9.2 8.9  Total Protein 6.5 - 8.1 g/dL 7.0 6.3 6.7  Total Bilirubin 0.3 - 1.2 mg/dL 0.5 0.5 0.6  Alkaline Phos 38 - 126 U/L 46 - 50  AST 15 - 41 U/L 24 30 22   ALT 0 - 44 U/L 32 30(H) 26      RADIOGRAPHIC STUDIES: I have personally reviewed the radiological images as listed and agreed with the findings in the report. No results found.   ASSESSMENT & PLAN:  Jill Myers is a 86 y.o. female with    1. Bilateral breast cancer, invasive ductal carcinoma, right upper inner quadrant, pT2N0M0, stage IA, ER+/PR+/HER2-, G3, left upper-outer quadrant,pT1cN0M0, stage IA, ER+/PR+/HER2-, G2 -She was diagnosed on 01/20/2017, and is s/pbilateral lumpectomy with negative margins,followed by adjuvant right breastand axillaryradiation. -She startedadjuvantAnastrozole on 1/2019but held around 01/2018 due to bilateral hip pain. Her joint pain is now resolved. She started Exemestane in 03/2017, but stopped after 1 week due to gluteal pain.  -Given her very early stage breast cancer and her age, poor tolerance, will hold adjuvant antiestrogen therapy  -From a breast cancer standpoint she is clinically doing well. Lab reviewed from last week, her CBC and CMP  are within normal limits except plt 562K, ALT 30. Her physical exam and her 02/2019 mammogram were unremarkable. There is no clinical concern for recurrence. -Continue surveillance. Next mammogram in 02/2020 -f/u in 5 months.    2.Essential thrombocythemia -OnHydrea dose currently 1000 mg= 2 capsules daily  -She is on ASA to reduce risk of thrombosis -Goal to keep plt<500k. She has been on Hydrea 1040m daily, tolerating well.  -Lab from last week (02/15/19) show plt has increased to PLT 562K. Will increase hydrea to 10067min AM and 50082mn the PM on Mondays and Thursdays and 1000m14mily the rest of the week.  -Repeat labs in 1 and 3 months at [facility] or PCP. She can contact clinic after each labs.    3. Joint pain, Morton's neuroma of third interspaces of both feet,Restless leg syndrome  -Any execrated joint pain from AI improved since stopping it -She continues to take Gabapentin 300mg44mnight, prn Ibuprofen. -She completed PT for feet or neck, but with no improvement. She is able to mostly manage her arthritic pain with moderate [facility]     4. Osteopenia  -She had a DEXA on 02/12/2018 and it showed aT-score of -2.2at right hip. Her overall fracture risk of 16%. -To strengthen her bone she started Zometa infusion q6mont75month/14/20 for 2 years. I encouraged her to watch for bone pain. She can take ibuprofen. -I encouraged her to maintain dental health and hygiene.  -I encouraged her to continue her calcium and vitDsupplement   5. HTN, GERD -on HCTZ, Omeprazole  -F/u PCP    PLAN: -Labs reviewed from last week, plt 562K. Will increase hydrea to 1000mg i60m and 500mg in80m PM on Mondays and Thursdays and 1000mg dai44mhe rest of the week.  -Will proceed with Zometa today  -Lab CBC at [facility] in 1 and 3 months, will let my nurse fax the request over  -Lab and F/u and zometa  infusion in 6 months    No problem-specific Assessment & Plan notes  found for this encounter.   Orders Placed This Encounter  Procedures  . CBC with Differential (Cancer Center Only)    Standing Status:   Standing    Number of Occurrences:   50    Standing Expiration Date:   02/21/2024   All questions were answered. The patient knows to call the clinic with any problems, questions or concerns. No barriers to learning was detected. The total time spent in the appointment was 25 minutes.     Truitt Merle, MD 02/21/2019   I, Joslyn Devon, am acting as scribe for Truitt Merle, MD.   I have reviewed the above documentation for accuracy and completeness, and I agree with the above.

## 2019-02-15 ENCOUNTER — Other Ambulatory Visit: Payer: Self-pay

## 2019-02-15 DIAGNOSIS — D473 Essential (hemorrhagic) thrombocythemia: Secondary | ICD-10-CM | POA: Diagnosis not present

## 2019-02-16 LAB — LIPID PANEL
Cholesterol: 163 mg/dL (ref <200)
HDL: 50 mg/dL (ref >=50)
LDL Cholesterol (Calc): 94 mg/dL (calc)
Non-HDL Cholesterol (Calc): 113 mg/dL (calc) (ref <130)
Total CHOL/HDL Ratio: 3.3 (calc) (ref <5.0)
Triglycerides: 94 mg/dL (ref <150)

## 2019-02-16 LAB — COMPLETE METABOLIC PANEL WITH GFR
AG Ratio: 1.9 (calc) (ref 1.0–2.5)
ALT: 30 U/L — ABNORMAL HIGH (ref 6–29)
AST: 30 U/L (ref 10–35)
Albumin: 4.1 g/dL (ref 3.6–5.1)
Alkaline phosphatase (APISO): 43 U/L (ref 37–153)
BUN: 14 mg/dL (ref 7–25)
CO2: 29 mmol/L (ref 20–32)
Calcium: 9.2 mg/dL (ref 8.6–10.4)
Chloride: 97 mmol/L — ABNORMAL LOW (ref 98–110)
Creat: 0.74 mg/dL (ref 0.60–0.88)
GFR, Est African American: 85 mL/min/{1.73_m2} (ref >=60)
GFR, Est Non African American: 73 mL/min/{1.73_m2} (ref >=60)
Globulin: 2.2 g/dL (calc) (ref 1.9–3.7)
Glucose, Bld: 90 mg/dL (ref 65–99)
Potassium: 4.2 mmol/L (ref 3.5–5.3)
Sodium: 134 mmol/L — ABNORMAL LOW (ref 135–146)
Total Bilirubin: 0.5 mg/dL (ref 0.2–1.2)
Total Protein: 6.3 g/dL (ref 6.1–8.1)

## 2019-02-16 LAB — CBC WITH DIFFERENTIAL/PLATELET
Absolute Monocytes: 773 cells/uL (ref 200–950)
Basophils Absolute: 38 cells/uL (ref 0–200)
Basophils Relative: 0.8 %
Eosinophils Absolute: 62 cells/uL (ref 15–500)
Eosinophils Relative: 1.3 %
HCT: 38.2 % (ref 35.0–45.0)
Hemoglobin: 13.3 g/dL (ref 11.7–15.5)
Lymphs Abs: 710 cells/uL — ABNORMAL LOW (ref 850–3900)
MCH: 39.2 pg — ABNORMAL HIGH (ref 27.0–33.0)
MCHC: 34.8 g/dL (ref 32.0–36.0)
MCV: 112.7 fL — ABNORMAL HIGH (ref 80.0–100.0)
MPV: 9.6 fL (ref 7.5–12.5)
Monocytes Relative: 16.1 %
Neutro Abs: 3216 cells/uL (ref 1500–7800)
Neutrophils Relative %: 67 %
Platelets: 562 10*3/uL — ABNORMAL HIGH (ref 140–400)
RBC: 3.39 10*6/uL — ABNORMAL LOW (ref 3.80–5.10)
RDW: 13.1 % (ref 11.0–15.0)
Total Lymphocyte: 14.8 %
WBC: 4.8 10*3/uL (ref 3.8–10.8)

## 2019-02-16 LAB — TSH: TSH: 4.04 mIU/L (ref 0.40–4.50)

## 2019-02-17 ENCOUNTER — Encounter: Payer: Self-pay | Admitting: Nurse Practitioner

## 2019-02-17 ENCOUNTER — Non-Acute Institutional Stay: Payer: Medicare PPO | Admitting: Nurse Practitioner

## 2019-02-17 ENCOUNTER — Other Ambulatory Visit: Payer: Self-pay

## 2019-02-17 VITALS — BP 148/74 | HR 77 | Temp 97.8°F | Ht 63.0 in | Wt 131.6 lb

## 2019-02-17 DIAGNOSIS — K219 Gastro-esophageal reflux disease without esophagitis: Secondary | ICD-10-CM

## 2019-02-17 DIAGNOSIS — G2581 Restless legs syndrome: Secondary | ICD-10-CM | POA: Diagnosis not present

## 2019-02-17 DIAGNOSIS — I10 Essential (primary) hypertension: Secondary | ICD-10-CM

## 2019-02-17 DIAGNOSIS — K5909 Other constipation: Secondary | ICD-10-CM

## 2019-02-17 DIAGNOSIS — D473 Essential (hemorrhagic) thrombocythemia: Secondary | ICD-10-CM

## 2019-02-17 DIAGNOSIS — N3941 Urge incontinence: Secondary | ICD-10-CM

## 2019-02-17 MED ORDER — SOLIFENACIN SUCCINATE 5 MG PO TABS: 5.0000 mg | ORAL_TABLET | Freq: Every day | ORAL | 2 refills | Status: DC

## 2019-02-17 MED ORDER — GABAPENTIN 300 MG PO CAPS: ORAL_CAPSULE | ORAL | 2 refills | Status: DC

## 2019-02-17 NOTE — Patient Instructions (Addendum)
F/u in clinic [place] in 6 months.

## 2019-02-17 NOTE — Assessment & Plan Note (Addendum)
Stable, BM qod is her routine,  continue Colace, MiraLax.

## 2019-02-17 NOTE — Assessment & Plan Note (Signed)
Blood pressure is controlled, continue HCTZ 

## 2019-02-17 NOTE — Assessment & Plan Note (Signed)
Continue Hydrea, plt 562 02/15/19

## 2019-02-17 NOTE — Assessment & Plan Note (Signed)
Stable, continue Omeprazole.  

## 2019-02-17 NOTE — Assessment & Plan Note (Signed)
Continue Gabapentin 300mg  qam, 900mg  qpm, R vs B reviewed with the patient

## 2019-02-17 NOTE — Progress Notes (Signed)
Location:   clinic [place]   Place of Service:  Clinic (12) Provider: Marlana Latus NP  Code Status: DNR Goals of Care: [place] 06/24/2018  Does Patient Have a Medical Advance Directive? Yes  Type of Advance Directive Living will;Healthcare Power of Attorney  Does patient want to make changes to medical advance directive? No - Patient declined  Copy of Peter in Chart? Yes - validated most recent copy scanned in chart (See row information)  Would patient like information on creating a medical advance directive? -     Chief Complaint  Patient presents with  . Medical Management of Chronic Issues    3 month follow up. Medication review.    HPI: Patient is a 86 y.o. female seen today for medical management of chronic diseases.    The patient has history of HTN, blood pressure is controlled on HCTZ 25mg  qd. Constipation, stable on MiraLax qd, Colace 250mg  qd. GERD, stable, on Omeprazole 20mg  qd. Cystocele/urinary incontinence, stable on Vesicare. Breast cancer, left and right lumpectomy, radiation on the right,  f/u oncology, Mammogram negative for malignancy. Essential thrombocythemia, on Hydrea. RLE Gabapentin 300mg  qam, 900mg  qhs.    Past Medical History:  Diagnosis Date  . Bladder cystocele 05/21/2010  . Complication of anesthesia    deglutition in history  . Deglutition disorder 08/17/2012   Single episode of choking/aspiration; preceded by trouble swallowing saliva (thin liquids).  For SLP evaluation. Aug 17, 2012.    [name] Disease of blood and blood forming organ 01/05/2007   Qualifier: Diagnosis of  By: Oneida Alar MD, KARL    . Diverticulitis   . DIVERTICULOSIS OF COLON 03/05/2006   Qualifier: Diagnosis of  By: Samara Snide    . DIZZINESS 01/18/2010   Qualifier: Diagnosis of  By: Lindell Noe MD, Jeneen Rinks    . Essential hypertension, benign 02/22/2008   Qualifier: Diagnosis of  By: Lindell Noe MD, Jeneen Rinks     . Essential thrombocythemia ([place]) 01/31/11  . Family history  of Alzheimer's disease 11/11/2013   Patient with family history of Alzheimers disease. She herself is very highly functional.  Plans for future visit with MMSE as primary function of the visit.    [name] GERD (gastroesophageal reflux disease) 08/16/2010  . HALLUX VALGUS, ACQUIRED 04/10/2009   Qualifier: Diagnosis of  By: [name] CMA,, [place]    . Hypertension   . HYPERTRIGLYCERIDEMIA 01/05/2007   Qualifier: Diagnosis of  By: Oneida Alar MD, KARL    . Insomnia 02/10/2014  . Left-sided chest wall pain 12/19/2011    L sided chest wall pain follows a dermatomal distribution and raises the possibility of thoracic radiculopathic pain, which would be expected to improve with gabapentin as well. We discussed this at length and she will call to make me aware if this does not get better. Rib belt/girdle in the meantime; may consider rib films or CXR if not improving.     . Metatarsalgia of right foot 11/28/2013   Referral to [facility]   . Osteopenia   . Personal history of radiation therapy    03/2017 right breast  . Restless leg syndrome 06/09/2011  . Restless legs syndrome (RLS)   . URINARY INCONTINENCE, URGE, MILD 11/27/2009   Qualifier: Diagnosis of  By: Zebedee Iba NP, Manuela Schwartz    . Vitamin D deficiency 08/16/2010    Past Surgical History:  Procedure Laterality Date  . BIOPSY BREAST Left 1998   Dr. Rebekah Chesterfield  . BREAST EXCISIONAL BIOPSY    . BREAST LUMPECTOMY Right  02/20/2017   Procedure: RIGHT BREAST LUMPECTOMY;  Surgeon: Jovita Kussmaul, MD;  Location: [place];  Service: General;  Laterality: Right;  . BREAST LUMPECTOMY Left 02/20/2017   left lumpectomy  . BREAST LUMPECTOMY WITH RADIOACTIVE SEED LOCALIZATION Left 02/20/2017   Procedure: LEFT BREAST LUMPECTOMY WITH RADIOACTIVE SEED LOCALIZATION;  Surgeon: Jovita Kussmaul, MD;  Location: [place];  Service: General;  Laterality: Left;  . CATARACT EXTRACTION  2010   Dr. [name]  . CHOLECYSTECTOMY  1992  . COLONOSCOPY  2008  . MOHS SURGERY  2012    on face [facility] [place]  . NAILBED REPAIR  12/05/2010   Procedure: NAILBED REPAIR;  Surgeon: Cammie Sickle., MD;  Location: [place];  Service: Orthopedics;  Laterality: Right;  full thickness nail biopsy right hallux  . torn tenden  2012   hand Dr. Orie Rout    Allergies  Allergen Reactions  . Doxycycline Nausea And Vomiting  . Macrodantin [Nitrofurantoin] Nausea And Vomiting  . Sulfamethoxazole-Trimethoprim Rash    REACTION: Rash after completing course of Septra    Allergies as of 02/17/2019      Reactions   Doxycycline Nausea And Vomiting   Macrodantin [nitrofurantoin] Nausea And Vomiting   Sulfamethoxazole-trimethoprim Rash   REACTION: Rash after completing course of Septra      Medication List       Accurate as of February 17, 2019 11:59 PM. If you have any questions, ask your nurse or doctor.        aspirin 81 MG tablet Take 81 mg by mouth daily.   calcium carbonate 1500 (600 Ca) MG Tabs tablet Commonly known as: OSCAL Take 600 mg of elemental calcium by mouth daily.   cholecalciferol 1000 units tablet Commonly known as: VITAMIN D Take 1,000 Units by mouth daily.   docusate sodium 250 MG capsule Commonly known as: COLACE Take 250 mg by mouth daily.   FISH OIL PO Take by mouth.   gabapentin 300 MG capsule Commonly known as: Neurontin Take one tablet in am, three tables in pm   Glucosamine-Chondroitin 750-600 MG Tabs Take 1 tablet by mouth. Take two tablet daily   hydrochlorothiazide 25 MG tablet Commonly known as: HYDRODIURIL TAKE 1 TABLET BY MOUTH EVERY DAY   hydroxyurea 500 MG capsule Commonly known as: HYDREA TAKE 2 CAPSULES (1,000 MG TOTAL) BY MOUTH DAILY. MAY TAKE WITH FOOD TO MINIMIZE GI SIDE EFFECTS.   ibuprofen 200 MG tablet Commonly known as: ADVIL Take 200 mg by mouth as needed.   Magnesium 500 MG Tabs Take 1 tablet by mouth daily.   omeprazole 20 MG capsule Commonly known as: PRILOSEC TAKE 1  CAPSULE BY MOUTH EVERY DAY   polyethylene glycol 17 g packet Commonly known as: MIRALAX / GLYCOLAX Take 17 g by mouth daily.   pyridOXINE 100 MG tablet Commonly known as: VITAMIN B-6 Take 100 mg by mouth daily.   solifenacin 5 MG tablet Commonly known as: VESIcare Take 1 tablet (5 mg total) by mouth daily. Please send Versicare 5mg  x 30, brand name product only What changed:   medication strength  additional instructions Changed by: Anterio Scheel X Zyanya Glaza, NP       Review of Systems:  Review of Systems  Constitutional: Negative for activity change, appetite change, chills, diaphoresis, fatigue and fever.  HENT: Positive for hearing loss and postnasal drip. Negative for congestion and voice change.   Eyes: Negative for visual disturbance.  Respiratory: Positive for cough.  Negative for shortness of breath and wheezing.        Chronic cough, post nasal drip  Cardiovascular: Negative for chest pain, palpitations and leg swelling.  Gastrointestinal: Negative for abdominal distention, abdominal pain, constipation and vomiting.  Genitourinary: Positive for frequency. Negative for difficulty urinating, dysuria and urgency.  Musculoskeletal: Positive for arthralgias and gait problem.       R+L foot MTJ pain, chronic.   Skin: Negative for color change and pallor.  Neurological: Negative for dizziness, speech difficulty, weakness and headaches.  Psychiatric/Behavioral: Negative for agitation, behavioral problems, hallucinations and sleep disturbance. The patient is not nervous/anxious.     Health Maintenance  Topic Date Due  . TETANUS/TDAP  04/11/2019  . INFLUENZA VACCINE  Completed  . DEXA SCAN  Completed  . PNA vac Low Risk Adult  Completed    Physical Exam: Vitals:   02/17/19 1310  BP: (!) 148/74  Pulse: 77  Temp: 97.8 F (36.6 C)  SpO2: 98%  Weight: 131 lb 9.6 oz (59.7 kg)  Height: 5\' 3"  (1.6 m)   Body mass index is 23.31 kg/m. Physical Exam Vitals and nursing note  reviewed.  Constitutional:      General: She is not in acute distress.    Appearance: Normal appearance. She is not ill-appearing or diaphoretic.  HENT:     Head: Normocephalic and atraumatic.     Nose: Nose normal.     Mouth/Throat:     Mouth: Mucous membranes are moist.  Eyes:     Extraocular Movements: Extraocular movements intact.     Conjunctiva/sclera: Conjunctivae normal.     Pupils: Pupils are equal, round, and reactive to light.  Cardiovascular:     Rate and Rhythm: Normal rate and regular rhythm.     Heart sounds: No murmur.  Pulmonary:     Breath sounds: No wheezing, rhonchi or rales.  Abdominal:     General: Bowel sounds are normal. There is no distension.     Palpations: Abdomen is soft.     Tenderness: There is no abdominal tenderness. There is no right CVA tenderness, left CVA tenderness, guarding or rebound.  Musculoskeletal:     Cervical back: Normal range of motion and neck supple.     Right lower leg: No edema.     Left lower leg: No edema.     Comments: MTJ R+L pain is chronic.   Skin:    General: Skin is warm and dry.  Neurological:     General: No focal deficit present.     Mental Status: She is alert and oriented to person, place, and time. Mental status is at baseline.     Motor: No weakness.     Coordination: Coordination normal.     Gait: Gait abnormal.  Psychiatric:        Mood and Affect: Mood normal.        Behavior: Behavior normal.        Thought Content: Thought content normal.        Judgment: Judgment normal.     Labs reviewed: Basic Metabolic Panel: Recent Labs    05/20/18 1036 08/20/18 0942 02/15/19 0705  NA 135 132* 134*  K 4.0 4.0 4.2  CL 98 94* 97*  CO2 30 28 29   GLUCOSE 91 92 90  BUN 18 16 14   CREATININE 0.84 0.87 0.74  CALCIUM 9.3 8.9 9.2  TSH  --   --  4.04   Liver Function Tests: Recent Labs    03/25/18  [id number] 03/25/18 0941 05/20/18 1036 08/20/18 0942 02/15/19 0705  AST 26   < > 26 22 30   ALT 33   < > 35 26  30*  ALKPHOS 62  --  52 50  --   BILITOT 0.7   < > 0.5 0.6 0.5  PROT 7.0   < > 7.0 6.7 6.3  ALBUMIN 4.0  --  4.1 4.0  --    < > = values in this interval not displayed.   No results for input(s): LIPASE, AMYLASE in the last 8760 hours. No results for input(s): AMMONIA in the last 8760 hours. CBC: Recent Labs    05/20/18 1036 08/20/18 0942 02/15/19 0705  WBC 5.3 4.2 4.8  NEUTROABS 3.7 2.9 3,216  HGB 13.1 13.1 13.3  HCT 38.8 37.9 38.2  MCV 114.8* 118.8* 112.7*  PLT 481* 467* 562*   Lipid Panel: Recent Labs    02/15/19 0705  CHOL 163  HDL 50  LDLCALC 94  TRIG 94  CHOLHDL 3.3   No results found for: HGBA1C  Procedures since last visit: MM DIAG BREAST TOMO BILATERAL  Result Date: 02/14/2019 CLINICAL DATA:  Status post bilateral lumpectomies for breast cancer in 2019. EXAM: DIGITAL DIAGNOSTIC BILATERAL MAMMOGRAM WITH CAD AND TOMO COMPARISON:  Previous exam(s). ACR Breast Density Category b: There are scattered areas of fibroglandular density. FINDINGS: Stable bilateral post lumpectomy changes. No interval findings suspicious for malignancy in either breast. Mammographic images were processed with CAD. IMPRESSION: No evidence of malignancy. RECOMMENDATION: Bilateral diagnostic mammogram in 1 year. I have discussed the findings and recommendations with the patient. If applicable, a reminder letter will be sent to the patient regarding the next appointment. BI-RADS CATEGORY  2: Benign. Electronically Signed   By: Claudie Revering M.D.   On: 02/14/2019 11:47    Assessment/Plan  Hypertension Blood pressure is controlled, continue HCTZ  Chronic constipation Stable, BM qod is her routine,  continue Colace, MiraLax.   GERD (gastroesophageal reflux disease) Stable, continue Omeprazole.   Essential thrombocythemia ([place]) Continue Hydrea, plt 562 02/15/19  URINARY INCONTINENCE, URGE, MILD Cystocele, urinary frequency, underwent Urology, continue Vesicare.   Restless leg  syndrome Continue Gabapentin 300mg  qam, 900mg  qpm, R vs B reviewed with the patient   Labs/tests ordered:  Labs at cancer center.   Next appt:  6  months.

## 2019-02-17 NOTE — Assessment & Plan Note (Addendum)
Cystocele, urinary frequency, underwent Urology, continue Vesicare.

## 2019-02-18 ENCOUNTER — Encounter: Payer: Self-pay | Admitting: Nurse Practitioner

## 2019-02-21 ENCOUNTER — Other Ambulatory Visit: Payer: Self-pay

## 2019-02-21 ENCOUNTER — Encounter: Payer: Self-pay | Admitting: Hematology

## 2019-02-21 ENCOUNTER — Inpatient Hospital Stay: Payer: Medicare PPO | Admitting: Hematology

## 2019-02-21 ENCOUNTER — Telehealth: Payer: Self-pay

## 2019-02-21 ENCOUNTER — Inpatient Hospital Stay: Payer: Medicare PPO

## 2019-02-21 ENCOUNTER — Inpatient Hospital Stay: Payer: Medicare PPO | Attending: Hematology

## 2019-02-21 VITALS — BP 164/81 | HR 73 | Temp 97.8°F | Resp 17 | Ht 63.0 in | Wt 129.2 lb

## 2019-02-21 DIAGNOSIS — C50211 Malignant neoplasm of upper-inner quadrant of right female breast: Secondary | ICD-10-CM | POA: Diagnosis not present

## 2019-02-21 DIAGNOSIS — Z923 Personal history of irradiation: Secondary | ICD-10-CM | POA: Diagnosis not present

## 2019-02-21 DIAGNOSIS — Z79811 Long term (current) use of aromatase inhibitors: Secondary | ICD-10-CM | POA: Diagnosis not present

## 2019-02-21 DIAGNOSIS — C50212 Malignant neoplasm of upper-inner quadrant of left female breast: Secondary | ICD-10-CM | POA: Insufficient documentation

## 2019-02-21 DIAGNOSIS — Z791 Long term (current) use of non-steroidal anti-inflammatories (NSAID): Secondary | ICD-10-CM | POA: Diagnosis not present

## 2019-02-21 DIAGNOSIS — M85851 Other specified disorders of bone density and structure, right thigh: Secondary | ICD-10-CM

## 2019-02-21 DIAGNOSIS — M25569 Pain in unspecified knee: Secondary | ICD-10-CM | POA: Diagnosis not present

## 2019-02-21 DIAGNOSIS — C50412 Malignant neoplasm of upper-outer quadrant of left female breast: Secondary | ICD-10-CM | POA: Diagnosis not present

## 2019-02-21 DIAGNOSIS — M25552 Pain in left hip: Secondary | ICD-10-CM | POA: Insufficient documentation

## 2019-02-21 DIAGNOSIS — G2581 Restless legs syndrome: Secondary | ICD-10-CM | POA: Insufficient documentation

## 2019-02-21 DIAGNOSIS — C50411 Malignant neoplasm of upper-outer quadrant of right female breast: Secondary | ICD-10-CM | POA: Insufficient documentation

## 2019-02-21 DIAGNOSIS — Z79899 Other long term (current) drug therapy: Secondary | ICD-10-CM | POA: Diagnosis not present

## 2019-02-21 DIAGNOSIS — D473 Essential (hemorrhagic) thrombocythemia: Secondary | ICD-10-CM | POA: Diagnosis not present

## 2019-02-21 DIAGNOSIS — G5760 Lesion of plantar nerve, unspecified lower limb: Secondary | ICD-10-CM | POA: Diagnosis not present

## 2019-02-21 DIAGNOSIS — Z170 Estrogen receptor positive status [ER+]: Secondary | ICD-10-CM

## 2019-02-21 DIAGNOSIS — Z7982 Long term (current) use of aspirin: Secondary | ICD-10-CM | POA: Diagnosis not present

## 2019-02-21 DIAGNOSIS — M25551 Pain in right hip: Secondary | ICD-10-CM | POA: Insufficient documentation

## 2019-02-21 DIAGNOSIS — K219 Gastro-esophageal reflux disease without esophagitis: Secondary | ICD-10-CM | POA: Insufficient documentation

## 2019-02-21 DIAGNOSIS — M8580 Other specified disorders of bone density and structure, unspecified site: Secondary | ICD-10-CM | POA: Diagnosis not present

## 2019-02-21 DIAGNOSIS — R32 Unspecified urinary incontinence: Secondary | ICD-10-CM | POA: Diagnosis not present

## 2019-02-21 DIAGNOSIS — I10 Essential (primary) hypertension: Secondary | ICD-10-CM | POA: Diagnosis not present

## 2019-02-21 LAB — CMP (CANCER CENTER ONLY)
ALT: 32 U/L (ref 0–44)
AST: 24 U/L (ref 15–41)
Albumin: 4.2 g/dL (ref 3.5–5.0)
Alkaline Phosphatase: 46 U/L (ref 38–126)
Anion gap: 10 (ref 5–15)
BUN: 10 mg/dL (ref 8–23)
CO2: 29 mmol/L (ref 22–32)
Calcium: 8.8 mg/dL — ABNORMAL LOW (ref 8.9–10.3)
Chloride: 96 mmol/L — ABNORMAL LOW (ref 98–111)
Creatinine: 0.74 mg/dL (ref 0.44–1.00)
GFR, Est AFR Am: >60 mL/min (ref >60)
GFR, Estimated: >60 mL/min (ref >60)
Glucose, Bld: 82 mg/dL (ref 70–99)
Potassium: 3.2 mmol/L — ABNORMAL LOW (ref 3.5–5.1)
Sodium: 135 mmol/L (ref 135–145)
Total Bilirubin: 0.5 mg/dL (ref 0.3–1.2)
Total Protein: 7 g/dL (ref 6.5–8.1)

## 2019-02-21 MED ORDER — HYDROXYUREA 500 MG PO CAPS: 1000.0000 mg | ORAL_CAPSULE | Freq: Every day | ORAL | 1 refills | Status: DC

## 2019-02-21 MED ORDER — ZOLEDRONIC ACID 4 MG/100ML IV SOLN
INTRAVENOUS | Status: AC
  Filled 2019-02-21: qty 100

## 2019-02-21 MED ORDER — SODIUM CHLORIDE 0.9 % IV SOLN
Freq: Once | INTRAVENOUS | Status: AC
  Filled 2019-02-21: qty 250

## 2019-02-21 MED ORDER — ZOLEDRONIC ACID 4 MG/100ML IV SOLN
4.0000 mg | Freq: Once | INTRAVENOUS | Status: AC
  Administered 2019-02-21: 4 mg via INTRAVENOUS

## 2019-02-21 NOTE — Telephone Encounter (Signed)
Per Dr. Burr Medico order faxed to [facility] att Angelica Pou [phone] for CBC to be drawn 1 month from now and in 3 months.

## 2019-02-21 NOTE — Patient Instructions (Signed)
Zoledronic Acid injection (Hypercalcemia, Oncology) What is this medicine? ZOLEDRONIC ACID (ZOE le dron ik AS id) lowers the amount of calcium loss from bone. It is used to treat too much calcium in your blood from cancer. It is also used to prevent complications of cancer that has spread to the bone. This medicine may be used for other purposes; ask your health care provider or pharmacist if you have questions. COMMON BRAND NAME(S): Zometa What should I tell my health care provider before I take this medicine? They need to know if you have any of these conditions:  aspirin-sensitive asthma  cancer, especially if you are receiving medicines used to treat cancer  dental disease or wear dentures  infection  kidney disease  receiving corticosteroids like dexamethasone or prednisone  an unusual or allergic reaction to zoledronic acid, other medicines, foods, dyes, or preservatives  pregnant or trying to get pregnant  breast-feeding How should I use this medicine? This medicine is for infusion into a vein. It is given by a health care professional in a hospital or clinic setting. Talk to your pediatrician regarding the use of this medicine in children. Special care may be needed. Overdosage: If you think you have taken too much of this medicine contact a poison control center or emergency room at once. NOTE: This medicine is only for you. Do not share this medicine with others. What if I miss a dose? It is important not to miss your dose. Call your doctor or health care professional if you are unable to keep an appointment. What may interact with this medicine?  certain antibiotics given by injection  NSAIDs, medicines for pain and inflammation, like ibuprofen or naproxen  some diuretics like bumetanide, furosemide  teriparatide  thalidomide This list may not describe all possible interactions. Give your health care provider a list of all the medicines, herbs, non-prescription  drugs, or dietary supplements you use. Also tell them if you smoke, drink alcohol, or use illegal drugs. Some items may interact with your medicine. What should I watch for while using this medicine? Visit your doctor or health care professional for regular checkups. It may be some time before you see the benefit from this medicine. Do not stop taking your medicine unless your doctor tells you to. Your doctor may order blood tests or other tests to see how you are doing. Women should inform their doctor if they wish to become pregnant or think they might be pregnant. There is a potential for serious side effects to an unborn child. Talk to your health care professional or pharmacist for more information. You should make sure that you get enough calcium and vitamin D while you are taking this medicine. Discuss the foods you eat and the vitamins you take with your health care professional. Some people who take this medicine have severe bone, joint, and/or muscle pain. This medicine may also increase your risk for jaw problems or a broken thigh bone. Tell your doctor right away if you have severe pain in your jaw, bones, joints, or muscles. Tell your doctor if you have any pain that does not go away or that gets worse. Tell your dentist and dental surgeon that you are taking this medicine. You should not have major dental surgery while on this medicine. See your dentist to have a dental exam and fix any dental problems before starting this medicine. Take good care of your teeth while on this medicine. Make sure you see your dentist for regular follow-up   appointments. What side effects may I notice from receiving this medicine? Side effects that you should report to your doctor or health care professional as soon as possible:  allergic reactions like skin rash, itching or hives, swelling of the face, lips, or tongue  anxiety, confusion, or depression  breathing problems  changes in vision  eye  pain  feeling faint or lightheaded, falls  jaw pain, especially after dental work  mouth sores  muscle cramps, stiffness, or weakness  redness, blistering, peeling or loosening of the skin, including inside the mouth  trouble passing urine or change in the amount of urine Side effects that usually do not require medical attention (report to your doctor or health care professional if they continue or are bothersome):  bone, joint, or muscle pain  constipation  diarrhea  fever  hair loss  irritation at site where injected  loss of appetite  nausea, vomiting  stomach upset  trouble sleeping  trouble swallowing  weak or tired This list may not describe all possible side effects. Call your doctor for medical advice about side effects. You may report side effects to FDA at 1-800-FDA-1088. Where should I keep my medicine? This drug is given in a hospital or clinic and will not be stored at home. NOTE: This sheet is a summary. It may not cover all possible information. If you have questions about this medicine, talk to your doctor, pharmacist, or health care provider.  2020 Elsevier/Gold Standard (2013-05-21 14:19:39)  

## 2019-02-22 ENCOUNTER — Telehealth: Payer: Self-pay

## 2019-02-22 ENCOUNTER — Telehealth: Payer: Self-pay | Admitting: Hematology

## 2019-02-22 ENCOUNTER — Other Ambulatory Visit: Payer: Self-pay | Admitting: *Deleted

## 2019-02-22 ENCOUNTER — Other Ambulatory Visit: Payer: Self-pay | Admitting: Hematology

## 2019-02-22 ENCOUNTER — Telehealth: Payer: Self-pay | Admitting: *Deleted

## 2019-02-22 MED ORDER — POTASSIUM CHLORIDE CRYS ER 20 MEQ PO TBCR: 20.0000 meq | EXTENDED_RELEASE_TABLET | Freq: Every day | ORAL | 0 refills | Status: DC

## 2019-02-22 NOTE — Telephone Encounter (Signed)
Please contact her MD who prescribed HCTZ and let them manage her hypokalemia then. Encourage her to have high K diet, thanks   Truitt Merle MD

## 2019-02-22 NOTE — Telephone Encounter (Signed)
Routed labs to PCP & called pt & informed to eat foods hight in K+ & discussed some of these & let her know that PCP will handle hypokalemia.  Pharmacy notified to cancel K+ script.

## 2019-02-22 NOTE — Telephone Encounter (Signed)
-----   Message from Truitt Merle, MD sent at 02/22/2019 10:51 AM EST ----- Please let pt know her K is slightly low,probably related to her HCTZ, I will call in KCL 7meq daily for 2 weeks for her, encourage her to have high K diet, and let her f/u with PCP, thanks   Truitt Merle  02/22/2019

## 2019-02-22 NOTE — Telephone Encounter (Signed)
Received call from [facility] that potassium script received & pt is on Vesicare from another provider & asking for alternative or instructions since contraindicated due to causing gastric ulcers, stenosis & ?.  Message routed to Dr Burr Medico.

## 2019-02-22 NOTE — Telephone Encounter (Signed)
VMM left for Pt. About slightly low potassium level relayed message that Dr. Burr Medico thinks it probably from the HCTZ that Pt. Is taking informed her that Dr. Burr Medico will send in a prescription to pharmacy for KCL 10 meq daily for 2 weeks and left some examples of High potassium foods on her answering machine. Informed Pt if she has any questions or concerns to give a return call to [facility].

## 2019-02-22 NOTE — Telephone Encounter (Signed)
Scheduled appt per 2/15 los. ° °Sent a message to HIM pool to get a calendar mailed out. ° °

## 2019-02-24 ENCOUNTER — Telehealth: Payer: Self-pay

## 2019-02-24 NOTE — Telephone Encounter (Signed)
Received VM for patient that she has been having significant right hip pain since her zometa infusion on 02/21/19. She also mentioned that the copy of lab work she had showed that her potassium level was 4.2 (not 3.2) so she was confused as to why she needed to increase the potassium in her diet.   Attempted to call patient's PCP to make sure provider received patient's most recent CMP results in case they wanted to adjust hydrochlorothiazide dose,  but received message that office is closed due to inclement weather. Returned patient's call to let her know that per Dr. Burr Medico some bone pain can be expected after zometa infusion, and that she recommends patient take her usual OTC pain medicine to help manage discomfort and encourage activity. Also clarified that lab work patient was referencing was from 02/15/19 and that her more recent results on 02/21/19 showed a decrease in her potassium level to 3.2. Reiterated potassium rich foods to add to her diet, and patient stated she would work on purchasing some from the store once weather was improved. Patient denied any other needs/concerns at this time, but knows to call out office should something arise in the future.   CMP results routed to Dr. Betsey Amen Mast and clinic number provided incase she has any questions for Dr. Burr Medico.

## 2019-03-11 ENCOUNTER — Other Ambulatory Visit: Payer: Self-pay | Admitting: Nurse Practitioner

## 2019-03-11 NOTE — Telephone Encounter (Signed)
Refill denied becasue Rx was already done.

## 2019-03-18 ENCOUNTER — Telehealth: Payer: Self-pay

## 2019-03-18 NOTE — Telephone Encounter (Signed)
Patient PA for VESIcare is Approved. PA Case: [id number], Status: Approved, Coverage Starts on: 01/07/2019 12:00:00 AM, Coverage Ends on: 01/06/2020 12:00:00 AM.

## 2019-03-19 ENCOUNTER — Other Ambulatory Visit: Payer: Self-pay | Admitting: Nurse Practitioner

## 2019-03-21 MED ORDER — SOLIFENACIN SUCCINATE 10 MG PO TABS: 10.0000 mg | ORAL_TABLET | Freq: Every day | ORAL | 3 refills | Status: DC

## 2019-03-21 NOTE — Telephone Encounter (Signed)
Non-Urgent Medical Question  Mast, [name], NP  You 7 minutes ago (10:41 AM)   Open, please send order in for Solifenacin 10mg  po

## 2019-03-21 NOTE — Telephone Encounter (Signed)
rx sent to pharmacy by e-script  

## 2019-04-14 ENCOUNTER — Other Ambulatory Visit: Payer: Self-pay

## 2019-04-14 ENCOUNTER — Encounter: Payer: Self-pay | Admitting: Nurse Practitioner

## 2019-04-14 ENCOUNTER — Non-Acute Institutional Stay: Payer: Medicare PPO | Admitting: Nurse Practitioner

## 2019-04-14 DIAGNOSIS — M549 Dorsalgia, unspecified: Secondary | ICD-10-CM | POA: Insufficient documentation

## 2019-04-14 DIAGNOSIS — G2581 Restless legs syndrome: Secondary | ICD-10-CM | POA: Diagnosis not present

## 2019-04-14 DIAGNOSIS — I10 Essential (primary) hypertension: Secondary | ICD-10-CM

## 2019-04-14 DIAGNOSIS — M79604 Pain in right leg: Secondary | ICD-10-CM | POA: Diagnosis not present

## 2019-04-14 DIAGNOSIS — M8949 Other hypertrophic osteoarthropathy, multiple sites: Secondary | ICD-10-CM

## 2019-04-14 DIAGNOSIS — M159 Polyosteoarthritis, unspecified: Secondary | ICD-10-CM

## 2019-04-14 NOTE — Assessment & Plan Note (Signed)
Right hip pain  

## 2019-04-14 NOTE — Progress Notes (Signed)
Location:   clinic [place]   Place of Service:  Clinic (12) Provider: Marlana Latus NP  Code Status: DNR Goals of Care:  Advanced Directives 04/14/2019  Does Patient Have a Medical Advance Directive? Yes  Type of Paramedic of [place];Living will  Does patient want to make changes to medical advance directive? No - Patient declined  Copy of [place] in Chart? Yes - validated most recent copy scanned in chart (See row information)  Would patient like information on creating a medical advance directive? -     Chief Complaint  Patient presents with  . Acute Visit    Right hip pain    HPI: Patient is a 86 y.o. female seen today for an acute visit for right hip pain, prn Ibuprofen 200mg  helped, ice pk helped, onset the 2nd day after Zoledronic acid infusion 02/21/19,, denied injury. Pain with weight bearing, not interfering her daily routine, 8-9/10, sharp and steady, sometime the entire leg hurts.   Hx of HTN, well controlled, on HCTZ. RLS, managed on Gabapentin 300mg  am, 900mg  pm.   Past Medical History:  Diagnosis Date  . Bladder cystocele 05/21/2010  . Complication of anesthesia    deglutition in history  . Deglutition disorder 08/17/2012   Single episode of choking/aspiration; preceded by trouble swallowing saliva (thin liquids).  For SLP evaluation. Aug 17, 2012.    [name] Disease of blood and blood forming organ 01/05/2007   Qualifier: Diagnosis of  By: Oneida Alar MD, KARL    . Diverticulitis   . DIVERTICULOSIS OF COLON 03/05/2006   Qualifier: Diagnosis of  By: Samara Snide    . DIZZINESS 01/18/2010   Qualifier: Diagnosis of  By: Lindell Noe MD, Jeneen Rinks    . Essential hypertension, benign 02/22/2008   Qualifier: Diagnosis of  By: Lindell Noe MD, Jeneen Rinks     . Essential thrombocythemia ([place]) 01/31/11  . Family history of Alzheimer's disease 11/11/2013   Patient with family history of Alzheimers disease. She herself is very highly functional.  Plans for future visit  with MMSE as primary function of the visit.    [name] GERD (gastroesophageal reflux disease) 08/16/2010  . HALLUX VALGUS, ACQUIRED 04/10/2009   Qualifier: Diagnosis of  By: [name] CMA,, Thekla    . Hypertension   . HYPERTRIGLYCERIDEMIA 01/05/2007   Qualifier: Diagnosis of  By: Oneida Alar MD, KARL    . Insomnia 02/10/2014  . Left-sided chest wall pain 12/19/2011    L sided chest wall pain follows a dermatomal distribution and raises the possibility of thoracic radiculopathic pain, which would be expected to improve with gabapentin as well. We discussed this at length and she will call to make me aware if this does not get better. Rib belt/girdle in the meantime; may consider rib films or CXR if not improving.     . Metatarsalgia of right foot 11/28/2013   Referral to [facility]   . Osteopenia   . Personal history of radiation therapy    03/2017 right breast  . Restless leg syndrome 06/09/2011  . Restless legs syndrome (RLS)   . URINARY INCONTINENCE, URGE, MILD 11/27/2009   Qualifier: Diagnosis of  By: Zebedee Iba NP, Manuela Schwartz    . Vitamin D deficiency 08/16/2010    Past Surgical History:  Procedure Laterality Date  . BIOPSY BREAST Left 1998   Dr. Rebekah Chesterfield  . BREAST EXCISIONAL BIOPSY    . BREAST LUMPECTOMY Right 02/20/2017   Procedure: RIGHT BREAST LUMPECTOMY;  Surgeon: Jovita Kussmaul, MD;  Location:  [place];  Service: General;  Laterality: Right;  . BREAST LUMPECTOMY Left 02/20/2017   left lumpectomy  . BREAST LUMPECTOMY WITH RADIOACTIVE SEED LOCALIZATION Left 02/20/2017   Procedure: LEFT BREAST LUMPECTOMY WITH RADIOACTIVE SEED LOCALIZATION;  Surgeon: Jovita Kussmaul, MD;  Location: [place];  Service: General;  Laterality: Left;  . CATARACT EXTRACTION  2010   Dr. [name]  . CHOLECYSTECTOMY  1992  . COLONOSCOPY  2008  . MOHS SURGERY  2012   on face [facility] [place]  . NAILBED REPAIR  12/05/2010   Procedure: NAILBED REPAIR;  Surgeon: Cammie Sickle., MD;  Location:  [place];  Service: Orthopedics;  Laterality: Right;  full thickness nail biopsy right hallux  . torn tenden  2012   hand Dr. Orie Rout    Allergies  Allergen Reactions  . Doxycycline Nausea And Vomiting  . Macrodantin [Nitrofurantoin] Nausea And Vomiting  . Sulfamethoxazole-Trimethoprim Rash    REACTION: Rash after completing course of Septra    Allergies as of 04/14/2019      Reactions   Doxycycline Nausea And Vomiting   Macrodantin [nitrofurantoin] Nausea And Vomiting   Sulfamethoxazole-trimethoprim Rash   REACTION: Rash after completing course of Septra      Medication List       Accurate as of April 14, 2019  1:41 PM. If you have any questions, ask your nurse or doctor.        aspirin 81 MG tablet Take 81 mg by mouth daily.   calcium carbonate 1500 (600 Ca) MG Tabs tablet Commonly known as: OSCAL Take 600 mg of elemental calcium by mouth in the morning and at bedtime.   cholecalciferol 1000 units tablet Commonly known as: VITAMIN D Take 2,000 Units by mouth daily.   docusate sodium 250 MG capsule Commonly known as: COLACE Take 250 mg by mouth daily.   FISH OIL PO Take by mouth.   gabapentin 300 MG capsule Commonly known as: Neurontin Take one tablet in am, three tables in pm   GLUCOSAMINE-CHONDROITIN PO Take by mouth. 1500-1200  Take two tablet daily   hydrochlorothiazide 25 MG tablet Commonly known as: HYDRODIURIL TAKE 1 TABLET BY MOUTH EVERY DAY   hydroxyurea 500 MG capsule Commonly known as: HYDREA Take 2 capsules (1,000 mg total) by mouth daily. May take with food to minimize GI side effects. Take extra 1 tab at evening on Mondays and Thursdays   ibuprofen 200 MG tablet Commonly known as: ADVIL Take 400 mg by mouth as needed.   Magnesium 500 MG Tabs Take 1 tablet by mouth daily.   omeprazole 20 MG capsule Commonly known as: PRILOSEC TAKE 1 CAPSULE BY MOUTH EVERY DAY   polyethylene glycol 17 g packet Commonly known as:  MIRALAX / GLYCOLAX Take 17 g by mouth daily.   pyridOXINE 100 MG tablet Commonly known as: VITAMIN B-6 Take 100 mg by mouth daily.   solifenacin 10 MG tablet Commonly known as: VESIcare Take 1 tablet (10 mg total) by mouth daily.       Review of Systems:  Review of Systems  Constitutional: Negative for activity change, appetite change, fatigue and fever.  HENT: Positive for hearing loss and postnasal drip. Negative for congestion and voice change.   Eyes: Negative for visual disturbance.  Respiratory: Positive for cough. Negative for shortness of breath and wheezing.        Chronic cough, post nasal drip  Cardiovascular: Positive for leg swelling. Negative for chest  pain and palpitations.  Gastrointestinal: Negative for abdominal distention, abdominal pain and constipation.  Genitourinary: Positive for frequency. Negative for difficulty urinating, dysuria and urgency.  Musculoskeletal: Positive for arthralgias, back pain and gait problem.       R+L foot MTJ pain, chronic. Right lower back, right hip, right leg pain x 2 months.   Skin: Negative for color change.  Neurological: Negative for speech difficulty, weakness, light-headedness and headaches.  Psychiatric/Behavioral: Negative for agitation, behavioral problems and sleep disturbance. The patient is not nervous/anxious.     Health Maintenance  Topic Date Due  . TETANUS/TDAP  04/11/2019  . INFLUENZA VACCINE  08/07/2019  . DEXA SCAN  Completed  . PNA vac Low Risk Adult  Completed    Physical Exam: Vitals:   04/14/19 1252  BP: 138/74  Pulse: 75  Temp: 97.8 F (36.6 C)  SpO2: 98%  Weight: 132 lb 6.4 oz (60.1 kg)  Height: 5\' 3"  (1.6 m)   Body mass index is 23.45 kg/m. Physical Exam Vitals and nursing note reviewed.  Constitutional:      General: She is not in acute distress.    Appearance: Normal appearance. She is not ill-appearing.  HENT:     Head: Normocephalic and atraumatic.     Nose: Nose normal.      Mouth/Throat:     Mouth: Mucous membranes are moist.  Eyes:     Extraocular Movements: Extraocular movements intact.     Conjunctiva/sclera: Conjunctivae normal.     Pupils: Pupils are equal, round, and reactive to light.  Cardiovascular:     Rate and Rhythm: Normal rate and regular rhythm.     Heart sounds: Murmur present.  Pulmonary:     Breath sounds: No rales.  Abdominal:     General: Bowel sounds are normal. There is no distension.     Palpations: Abdomen is soft.     Tenderness: There is no abdominal tenderness. There is no right CVA tenderness, left CVA tenderness, guarding or rebound.  Musculoskeletal:     Cervical back: Normal range of motion and neck supple.     Right lower leg: Edema present.     Left lower leg: Edema present.     Comments: Very mild edema BLE. No pain with leg raise or PROM of there right hip/leg upon my examination. The patient stated her pain is sharp/steady 7-9/10 with weight bearing, Ibuprofen and ice pk relieves pain.   Skin:    General: Skin is warm and dry.  Neurological:     General: No focal deficit present.     Mental Status: She is alert and oriented to person, place, and time. Mental status is at baseline.     Motor: No weakness.     Coordination: Coordination normal.     Gait: Gait abnormal.  Psychiatric:        Mood and Affect: Mood normal.        Behavior: Behavior normal.        Thought Content: Thought content normal.        Judgment: Judgment normal.     Labs reviewed: Basic Metabolic Panel: Recent Labs    08/20/18 0942 02/15/19 0705 02/21/19 0949  NA 132* 134* 135  K 4.0 4.2 3.2*  CL 94* 97* 96*  CO2 28 29 29   GLUCOSE 92 90 82  BUN 16 14 10   CREATININE 0.87 0.74 0.74  CALCIUM 8.9 9.2 8.8*  TSH  --  4.04  --    Liver Function  Tests: Recent Labs    05/20/18 1036 05/20/18 1036 08/20/18 0942 02/15/19 0705 02/21/19 0949  AST 26   < > 22 30 24   ALT 35   < > 26 30* 32  ALKPHOS 52  --  50  --  46  BILITOT 0.5   <  > 0.6 0.5 0.5  PROT 7.0   < > 6.7 6.3 7.0  ALBUMIN 4.1  --  4.0  --  4.2   < > = values in this interval not displayed.   No results for input(s): LIPASE, AMYLASE in the last 8760 hours. No results for input(s): AMMONIA in the last 8760 hours. CBC: Recent Labs    05/20/18 1036 08/20/18 0942 02/15/19 0705  WBC 5.3 4.2 4.8  NEUTROABS 3.7 2.9 3,216  HGB 13.1 13.1 13.3  HCT 38.8 37.9 38.2  MCV 114.8* 118.8* 112.7*  PLT 481* 467* 562*   Lipid Panel: Recent Labs    02/15/19 0705  CHOL 163  HDL 50  LDLCALC 94  TRIG 94  CHOLHDL 3.3   No results found for: HGBA1C  Procedures since last visit: No results found.  Assessment/Plan Osteoarthritis, multiple sites Right hip pain  Restless leg syndrome Stable, continue Gabapentin 300mg  qam, 900mg  qpm  Hypertension Blood pressure is controlled, continue HCTZ  Pain of back and right lower extremity Right SIJ region, sometime the entire RLE for 2 months, will obtain X-ray Lumbar spine, pelvis, R hip.     Labs/tests ordered:  X-ray lumbar spine, pelvis, right hip  Next appt:  04/22/2019

## 2019-04-14 NOTE — Assessment & Plan Note (Signed)
Stable, continue Gabapentin 300mg  qam, 900mg  qpm

## 2019-04-14 NOTE — Patient Instructions (Signed)
Right lower back/hip/SIJ pain x 2 months, not interfering daily routines, will obtain X-ray of the lumbar spine, pelvis, and right hip to r/o fractures.

## 2019-04-14 NOTE — Assessment & Plan Note (Signed)
Blood pressure is controlled, continue HCTZ 

## 2019-04-14 NOTE — Assessment & Plan Note (Signed)
Right SIJ region, sometime the entire RLE for 2 months, will obtain X-ray Lumbar spine, pelvis, R hip.

## 2019-04-18 ENCOUNTER — Other Ambulatory Visit: Payer: Self-pay

## 2019-04-18 ENCOUNTER — Other Ambulatory Visit: Payer: Self-pay | Admitting: Nurse Practitioner

## 2019-04-18 ENCOUNTER — Ambulatory Visit (HOSPITAL_COMMUNITY)
Admission: RE | Admit: 2019-04-18 | Discharge: 2019-04-18 | Disposition: A | Discharge status: Home / Self Care | Payer: Medicare PPO | Source: Ambulatory Visit | Attending: Nurse Practitioner | Admitting: Nurse Practitioner

## 2019-04-18 DIAGNOSIS — M549 Dorsalgia, unspecified: Secondary | ICD-10-CM

## 2019-04-18 DIAGNOSIS — M79604 Pain in right leg: Secondary | ICD-10-CM

## 2019-04-18 DIAGNOSIS — M545 Low back pain: Secondary | ICD-10-CM | POA: Diagnosis not present

## 2019-04-18 DIAGNOSIS — M25551 Pain in right hip: Secondary | ICD-10-CM | POA: Diagnosis not present

## 2019-04-19 ENCOUNTER — Other Ambulatory Visit: Payer: Self-pay | Admitting: Nurse Practitioner

## 2019-04-19 DIAGNOSIS — M79604 Pain in right leg: Secondary | ICD-10-CM

## 2019-04-19 DIAGNOSIS — M549 Dorsalgia, unspecified: Secondary | ICD-10-CM

## 2019-04-19 NOTE — Progress Notes (Signed)
Referral to Ortho for lower back pain

## 2019-04-21 DIAGNOSIS — D696 Thrombocytopenia, unspecified: Secondary | ICD-10-CM | POA: Diagnosis not present

## 2019-04-21 LAB — CBC AND DIFFERENTIAL
HCT: 39 (ref 36–46)
Hemoglobin: 13.5 (ref 12.0–16.0)
Neutrophils Absolute: 2698
Platelets: 520 — AB (ref 150–399)
WBC: 4.1

## 2019-04-21 LAB — CBC: RBC: 3.43 — AB (ref 3.87–5.11)

## 2019-04-22 ENCOUNTER — Encounter: Payer: Self-pay | Admitting: Internal Medicine

## 2019-04-22 ENCOUNTER — Other Ambulatory Visit: Payer: Self-pay

## 2019-04-22 ENCOUNTER — Non-Acute Institutional Stay: Payer: Medicare PPO | Admitting: Internal Medicine

## 2019-04-22 VITALS — BP 148/80 | HR 74 | Temp 97.5°F | Ht 63.0 in | Wt 127.6 lb

## 2019-04-22 DIAGNOSIS — I10 Essential (primary) hypertension: Secondary | ICD-10-CM | POA: Diagnosis not present

## 2019-04-22 DIAGNOSIS — M25551 Pain in right hip: Secondary | ICD-10-CM | POA: Diagnosis not present

## 2019-04-22 DIAGNOSIS — G2581 Restless legs syndrome: Secondary | ICD-10-CM

## 2019-04-22 DIAGNOSIS — D473 Essential (hemorrhagic) thrombocythemia: Secondary | ICD-10-CM

## 2019-04-22 DIAGNOSIS — K219 Gastro-esophageal reflux disease without esophagitis: Secondary | ICD-10-CM

## 2019-04-22 DIAGNOSIS — N3941 Urge incontinence: Secondary | ICD-10-CM

## 2019-04-22 NOTE — Progress Notes (Signed)
Location: [place] of Service:  Clinic (12)  Provider:   Code Status:  Goals of Care:  Advanced Directives 04/14/2019  Does Patient Have a Medical Advance Directive? Yes  Type of Paramedic of [place];Living will  Does patient want to make changes to medical advance directive? No - Patient declined  Copy of [place] in Chart? Yes - validated most recent copy scanned in chart (See row information)  Would patient like information on creating a medical advance directive? -     Chief Complaint  Patient presents with  . Acute Visit    Patient would like to discuss her medications, hip/spine xray results.     HPI: Patient is a 86 y.o. female seen today for an acute visit for Right Hip Pain Patient has a history of bilateral breast cancer s/p lumpectomy followed by lymph radiation.  Follows with Dr. Annamaria Boots, essential thrombocythemia on Hydrea, restless leg syndrome, hypertension, osteopenia and GERD  She received IV Zometa she states immediately after the infusion she had severe pain in her right hip.  She came to see Manxi few weeks ago and she did x-ray for her.  The x-rays have not shown any kind of acute process.  Patient states her pain did get better by taking ibuprofen.  At one time she was taking 8 tablets.  But now she only takes 1 or 2.  She was having difficult time in weightbearing and was limping but now she is able to walk.  She is getting another infusion in August.  Was very concerned about the pain. Today patient walked from her apartment to the clinic with no issues.  Has not had any falls or any other kind of injuries.  Past Medical History:  Diagnosis Date  . Bladder cystocele 05/21/2010  . Complication of anesthesia    deglutition in history  . Deglutition disorder 08/17/2012   Single episode of choking/aspiration; preceded by trouble swallowing saliva (thin liquids).  For SLP evaluation. Aug 17, 2012.      [name] Disease of blood and blood forming organ 01/05/2007   Qualifier: Diagnosis of  By: Oneida Alar MD, KARL    . Diverticulitis   . DIVERTICULOSIS OF COLON 03/05/2006   Qualifier: Diagnosis of  By: Samara Snide    . DIZZINESS 01/18/2010   Qualifier: Diagnosis of  By: Lindell Noe MD, Jeneen Rinks    . Essential hypertension, benign 02/22/2008   Qualifier: Diagnosis of  By: Lindell Noe MD, Jeneen Rinks     . Essential thrombocythemia ([place]) 01/31/11  . Family history of Alzheimer's disease 11/11/2013   Patient with family history of Alzheimers disease. She herself is very highly functional.  Plans for future visit with MMSE as primary function of the visit.    [name] GERD (gastroesophageal reflux disease) 08/16/2010  . HALLUX VALGUS, ACQUIRED 04/10/2009   Qualifier: Diagnosis of  By: [name] CMA,, Thekla    . Hypertension   . HYPERTRIGLYCERIDEMIA 01/05/2007   Qualifier: Diagnosis of  By: Oneida Alar MD, KARL    . Insomnia 02/10/2014  . Left-sided chest wall pain 12/19/2011    L sided chest wall pain follows a dermatomal distribution and raises the possibility of thoracic radiculopathic pain, which would be expected to improve with gabapentin as well. We discussed this at length and she will call to make me aware if this does not get better. Rib belt/girdle in the meantime; may consider rib films or CXR if not improving.     [name]  Metatarsalgia of right foot 11/28/2013   Referral to [facility]   . Osteopenia   . Personal history of radiation therapy    03/2017 right breast  . Restless leg syndrome 06/09/2011  . Restless legs syndrome (RLS)   . URINARY INCONTINENCE, URGE, MILD 11/27/2009   Qualifier: Diagnosis of  By: Zebedee Iba NP, Manuela Schwartz    . Vitamin D deficiency 08/16/2010    Past Surgical History:  Procedure Laterality Date  . BIOPSY BREAST Left 1998   Dr. Rebekah Chesterfield  . BREAST EXCISIONAL BIOPSY    . BREAST LUMPECTOMY Right 02/20/2017   Procedure: RIGHT BREAST LUMPECTOMY;  Surgeon: Jovita Kussmaul, MD;  Location: [place];  Service: General;   Laterality: Right;  . BREAST LUMPECTOMY Left 02/20/2017   left lumpectomy  . BREAST LUMPECTOMY WITH RADIOACTIVE SEED LOCALIZATION Left 02/20/2017   Procedure: LEFT BREAST LUMPECTOMY WITH RADIOACTIVE SEED LOCALIZATION;  Surgeon: Jovita Kussmaul, MD;  Location: [place];  Service: General;  Laterality: Left;  . CATARACT EXTRACTION  2010   Dr. [name]  . CHOLECYSTECTOMY  1992  . COLONOSCOPY  2008  . MOHS SURGERY  2012   on face [facility] [place]  . NAILBED REPAIR  12/05/2010   Procedure: NAILBED REPAIR;  Surgeon: Cammie Sickle., MD;  Location: [place];  Service: Orthopedics;  Laterality: Right;  full thickness nail biopsy right hallux  . torn tenden  2012   hand Dr. Orie Rout    Allergies  Allergen Reactions  . Doxycycline Nausea And Vomiting  . Macrodantin [Nitrofurantoin] Nausea And Vomiting  . Sulfamethoxazole-Trimethoprim Rash    REACTION: Rash after completing course of Septra    Outpatient Encounter Medications as of 04/22/2019  Medication Sig  . aspirin 81 MG tablet Take 81 mg by mouth daily.    . calcium carbonate (OSCAL) 1500 (600 Ca) MG TABS tablet Take 600 mg of elemental calcium by mouth in the morning and at bedtime.   . cholecalciferol (VITAMIN D) 1000 UNITS tablet Take 2,000 Units by mouth daily.   [name] docusate sodium (COLACE) 250 MG capsule Take 250 mg by mouth daily.  [name] gabapentin (NEURONTIN) 300 MG capsule Take one tablet in am, three tables in pm  . GLUCOSAMINE-CHONDROITIN PO Take by mouth. 1500-1200  Take two tablet daily  . hydrochlorothiazide (HYDRODIURIL) 25 MG tablet TAKE 1 TABLET BY MOUTH EVERY DAY  . hydroxyurea (HYDREA) 500 MG capsule Take 2 capsules (1,000 mg total) by mouth daily. May take with food to minimize GI side effects. Take extra 1 tab at evening on Mondays and Thursdays  . ibuprofen (ADVIL,MOTRIN) 200 MG tablet Take 400 mg by mouth as needed.   . Magnesium 500 MG TABS Take 1 tablet by mouth daily.    . Omega-3 Fatty Acids (FISH OIL PO) Take by mouth.  [name] omeprazole (PRILOSEC) 20 MG capsule TAKE 1 CAPSULE BY MOUTH EVERY DAY  . polyethylene glycol (MIRALAX / GLYCOLAX) packet Take 17 g by mouth daily.    [name] pyridOXINE (VITAMIN B-6) 100 MG tablet Take 100 mg by mouth daily.  . solifenacin (VESICARE) 10 MG tablet Take 1 tablet (10 mg total) by mouth daily. (Patient taking differently: Take by mouth daily. Patient currently taking 2.5 mg daily)   No facility-administered encounter medications on file as of 04/22/2019.    Review of Systems:  Review of Systems  Constitutional: Negative.   HENT: Negative.   Respiratory: Negative.   Cardiovascular: Negative.   Gastrointestinal: Negative.  Genitourinary: Negative.   Musculoskeletal: Positive for arthralgias, back pain, gait problem and myalgias.  Neurological: Positive for weakness.  Psychiatric/Behavioral: The patient is nervous/anxious.     Health Maintenance  Topic Date Due  . TETANUS/TDAP  04/11/2019  . INFLUENZA VACCINE  08/07/2019  . DEXA SCAN  Completed  . PNA vac Low Risk Adult  Completed    Physical Exam: Vitals:   04/22/19 1131  BP: (!) 148/80  Pulse: 74  Temp: (!) 97.5 F (36.4 C)  SpO2: 94%  Weight: 127 lb 9.6 oz (57.9 kg)  Height: 5\' 3"  (1.6 m)   Body mass index is 22.6 kg/m. Physical Exam Constitutional: Oriented to person, place, and time. Well-developed and well-nourished.  HENT:  Head: Normocephalic.  Mouth/Throat: Oropharynx is clear and moist.  Eyes: Pupils are equal, round, and reactive to light.  Neck: Neck supple.  Cardiovascular: Normal rate and normal heart sounds.  No murmur heard. Pulmonary/Chest: Effort normal and breath sounds normal. No respiratory distress. No wheezes. She has no rales.  Abdominal: Soft. Bowel sounds are normal. No distension. There is no tenderness. There is no rebound.  Musculoskeletal: Mild Edema Bilateral  Complete Addiction and Abduction of the Hip was negative for any  Pain or restrictions. Gait was normal with no Limping Lymphadenopathy: none Neurological: Alert and oriented to person, place, and time.  Skin: Skin is warm and dry.  Psychiatric: Normal mood and affect. Behavior is normal. Thought content normal.    Labs reviewed: Basic Metabolic Panel: Recent Labs    08/20/18 0942 02/15/19 0705 02/21/19 0949  NA 132* 134* 135  K 4.0 4.2 3.2*  CL 94* 97* 96*  CO2 28 29 29   GLUCOSE 92 90 82  BUN 16 14 10   CREATININE 0.87 0.74 0.74  CALCIUM 8.9 9.2 8.8*  TSH  --  4.04  --    Liver Function Tests: Recent Labs    05/20/18 1036 05/20/18 1036 08/20/18 0942 02/15/19 0705 02/21/19 0949  AST 26   < > 22 30 24   ALT 35   < > 26 30* 32  ALKPHOS 52  --  50  --  46  BILITOT 0.5   < > 0.6 0.5 0.5  PROT 7.0   < > 6.7 6.3 7.0  ALBUMIN 4.1  --  4.0  --  4.2   < > = values in this interval not displayed.   No results for input(s): LIPASE, AMYLASE in the last 8760 hours. No results for input(s): AMMONIA in the last 8760 hours. CBC: Recent Labs    05/20/18 1036 05/20/18 1036 08/20/18 0942 02/15/19 0705 04/21/19 0000  WBC 5.3   < > 4.2 4.8 4.1  NEUTROABS 3.7   < > 2.9 3,216 2,698  HGB 13.1   < > 13.1 13.3 13.5  HCT 38.8   < > 37.9 38.2 39  MCV 114.8*  --  118.8* 112.7*  --   PLT 481*   < > 467* 562* 520*   < > = values in this interval not displayed.   Lipid Panel: Recent Labs    02/15/19 0705  CHOL 163  HDL 50  LDLCALC 94  TRIG 94  CHOLHDL 3.3   No results found for: HGBA1C  Procedures since last visit: DG Lumbar Spine Complete  Result Date: 04/18/2019 CLINICAL DATA:  Low back pain for 2 months EXAM: LUMBAR SPINE - COMPLETE 4+ VIEW COMPARISON:  None. FINDINGS: Five lumbar type vertebral segments. Vertebral body heights and alignment are maintained.  No fracture identified. Multilevel intervertebral disc space loss with associated degenerative endplate changes, most pronounced at L2-3. Lower lumbar facet arthrosis. Calcified  uterine fibroid. IMPRESSION: Mild-to-moderate multilevel lumbar spondylosis. Electronically Signed   By: Davina Poke D.O.   On: 04/18/2019 15:06   DG Pelvis 1-2 Views  Result Date: 04/18/2019 CLINICAL DATA:  Right low back and right hip pain 2 months. EXAM: PELVIS - 1-2 VIEW COMPARISON:  CT 11/25/2006 FINDINGS: Bone density is within normal. There is no fracture or dislocation over the hips. Remaining pelvic bones are unremarkable. Surgical clips over the pelvis. Known 4.5 cm calcified uterine fibroid. IMPRESSION: No acute findings. Electronically Signed   By: Marin Olp M.D.   On: 04/18/2019 15:03    Assessment/Plan Right hip pain Xray Negative for any Acute Process Per patient her pain has definitely improved. She has appointment with Dr. Erlinda Hong in 2 weeks I told her to take as needed ibuprofen but not exceed more than 600 mg a day Also to make sure Dr. Burr Medico is aware.  I we had discussed the possibility that this could be because of the Zumeta infusion  Other Issues were stable. Will be discussed in Next Appointment  Essential hypertension On HCTZ Restless leg syndrome Control on Neurontin Gastroesophageal reflux disease without esophagitis  URINARY INCONTINENCE, URGE, MILD On Vesicare Essential thrombocythemia ([place]) Continue Hydrea    Labs/tests ordered:  * No order type specified * Next appt:  08/18/2019

## 2019-04-26 ENCOUNTER — Telehealth: Payer: Self-pay | Admitting: *Deleted

## 2019-04-26 NOTE — Telephone Encounter (Signed)
Called pt & informed of slightly better platelet count from labs done 04/21/19 from [facility].  Last platelet count 562 & this one 520.  Per Dr Burr Medico, informed to cont current dose of hydrea which is 1000 mg daily except 1500 mg on Mondays & Thursdays.  She expressed understanding but wanted Dr Burr Medico to know that she has been having R hip pain since zometa infusion.  She states that it really got bad & was taking pain killers but has tried to back off & use ice.  She states it is some better but still has sharp jabbing pain.  She had some xrays of pelvis & lower back.  She has an appt with Dr Domingo Madeira this week & hopes to get hip x-ray.  She is concerned that this is related to Zometa. Message routed to Dr Feng/Pod RN

## 2019-04-27 DIAGNOSIS — Z823 Family history of stroke: Secondary | ICD-10-CM | POA: Diagnosis not present

## 2019-04-27 DIAGNOSIS — K219 Gastro-esophageal reflux disease without esophagitis: Secondary | ICD-10-CM | POA: Diagnosis not present

## 2019-04-27 DIAGNOSIS — Z7982 Long term (current) use of aspirin: Secondary | ICD-10-CM | POA: Diagnosis not present

## 2019-04-27 DIAGNOSIS — D473 Essential (hemorrhagic) thrombocythemia: Secondary | ICD-10-CM | POA: Diagnosis not present

## 2019-04-27 DIAGNOSIS — Z8249 Family history of ischemic heart disease and other diseases of the circulatory system: Secondary | ICD-10-CM | POA: Diagnosis not present

## 2019-04-27 DIAGNOSIS — I10 Essential (primary) hypertension: Secondary | ICD-10-CM | POA: Diagnosis not present

## 2019-04-27 DIAGNOSIS — N3941 Urge incontinence: Secondary | ICD-10-CM | POA: Diagnosis not present

## 2019-04-27 DIAGNOSIS — G629 Polyneuropathy, unspecified: Secondary | ICD-10-CM | POA: Diagnosis not present

## 2019-04-27 DIAGNOSIS — M1990 Unspecified osteoarthritis, unspecified site: Secondary | ICD-10-CM | POA: Diagnosis not present

## 2019-04-28 ENCOUNTER — Ambulatory Visit: Payer: Medicare PPO | Admitting: Orthopaedic Surgery

## 2019-04-28 ENCOUNTER — Encounter: Payer: Self-pay | Admitting: Orthopaedic Surgery

## 2019-04-28 ENCOUNTER — Other Ambulatory Visit: Payer: Self-pay

## 2019-04-28 DIAGNOSIS — M5416 Radiculopathy, lumbar region: Secondary | ICD-10-CM | POA: Diagnosis not present

## 2019-04-28 MED ORDER — PREDNISONE 5 MG (21) PO TBPK: ORAL_TABLET | ORAL | 0 refills | Status: DC

## 2019-04-28 MED ORDER — METHOCARBAMOL 500 MG PO TABS: 500.0000 mg | ORAL_TABLET | Freq: Two times a day (BID) | ORAL | 0 refills | Status: DC | PRN

## 2019-04-28 NOTE — Progress Notes (Signed)
Office Visit Note   Patient: Jill Myers           Date of Birth: Jun 27, 1930           MRN: [id number] Visit Date: 04/28/2019              Requested by: Mast, [name], NP [address],  [place] [id number] PCP: Mast, [name], NP   Assessment & Plan: Visit Diagnoses:  1. Radiculopathy, lumbar region     Plan: Impression is chronic right lower back pain and right lower extremity radiculopathy.  We have discussed a Sterapred taper, muscle relaxer and physical therapy, but the patient does not feel her symptoms are bad enough at this point.  She would like hard copies of each for if this flares back up.  She will follow up with [place] as needed.  Follow-Up Instructions: Return if symptoms worsen or fail to improve.   Orders:  No orders of the defined types were placed in this encounter.  Meds ordered this encounter  Medications  . predniSONE (STERAPRED UNI-PAK 21 TAB) 5 MG (21) TBPK tablet    Sig: Take as directed    Dispense:  21 tablet    Refill:  0  . methocarbamol (ROBAXIN) 500 MG tablet    Sig: Take 1 tablet (500 mg total) by mouth 2 (two) times daily as needed.    Dispense:  20 tablet    Refill:  0      Procedures: No procedures performed   Clinical Data: No additional findings.   Subjective: Chief Complaint  Patient presents with  . Lower Back - Pain  . Right Hip - Pain    HPI patient is a pleasant 86 year old female who comes in today with right hip pain.  The pain she is actually having is to the right buttocks and runs down the anterior thigh and into the shin.  She noticed this approximately 2 months ago.  Coincidentally it was the same day as her calcium infusion.  She has had an infusion in the past without any issues.  The pain was actually improving quite a bit until she reaggravated it doing the warrior pose in yoga 4 days ago.  She denies any pain to the groin.  No weakness to the right lower extremity.  No numbness, tingling or burning.  Her  pain is aggravated with bearing weight on the right side as well as going from a seated to standing position.  She has been taking ibuprofen and using ice which has helped quite a bit.  No previous lumbar pathology.  Previous x-rays obtained showed mild to moderate multilevel lumbar spondylosis worse at L2-3.  No previous ESI or surgical intervention.  Review of Systems as detailed in HPI.  All others reviewed and are negative.   Objective: Vital Signs: There were no vitals taken for this visit.  Physical Exam well-developed well-nourished female no acute distress.  Alert and oriented x3.  Ortho Exam examination of the lumbar spine reveals no spinous or paraspinous tenderness.  She has mild tenderness to the greater trochanter.  Negative logroll, negative FADIR negative straight leg raise.  She is neurovascular intact distally.  Specialty Comments:  No specialty comments available.  Imaging: No new imaging   PMFS History: Patient Active Problem List   Diagnosis Date Noted  . Pain of back and right lower extremity 04/14/2019  . Osteoarthritis, multiple sites 12/24/2017  . Malignant neoplasm of upper-inner quadrant of right breast  in female, estrogen receptor positive ([place]) 01/20/2017  . Malignant neoplasm of upper-outer quadrant of left breast in female, estrogen receptor positive ([place]) 01/20/2017  . Chronic constipation 06/19/2016  . Morton's neuroma of third interspaces of both feet 04/16/2015  . Osteopenia 10/06/2014  . Hypertension   . Essential thrombocythemia ([place]) 05/08/2014  . Cystitis 02/20/2014  . Insomnia 02/10/2014  . Metatarsalgia of both feet 11/28/2013  . Family history of Alzheimer's disease 11/11/2013  . Deglutition disorder 08/17/2012  . Left-sided chest wall pain 12/19/2011  . Restless leg syndrome 06/09/2011  . GERD (gastroesophageal reflux disease) 08/16/2010  . Vitamin D deficiency 08/16/2010  . Advanced directives, counseling/discussion 08/16/2010  .  Dysuria 05/21/2010  . Cystocele, unspecified (CODE) 05/21/2010  . DIZZINESS 01/18/2010  . URINARY INCONTINENCE, URGE, MILD 11/27/2009  . Hallux valgus, acquired 04/10/2009  . Essential hypertension, benign 02/22/2008  . HYPERTRIGLYCERIDEMIA 01/05/2007  . Disease of blood and blood forming organ 01/05/2007  . DIVERTICULOSIS OF COLON 03/05/2006   Past Medical History:  Diagnosis Date  . Bladder cystocele 05/21/2010  . Complication of anesthesia    deglutition in history  . Deglutition disorder 08/17/2012   Single episode of choking/aspiration; preceded by trouble swallowing saliva (thin liquids).  For SLP evaluation. Aug 17, 2012.    [name] Disease of blood and blood forming organ 01/05/2007   Qualifier: Diagnosis of  By: Oneida Alar MD, KARL    . Diverticulitis   . DIVERTICULOSIS OF COLON 03/05/2006   Qualifier: Diagnosis of  By: Samara Snide    . DIZZINESS 01/18/2010   Qualifier: Diagnosis of  By: Lindell Noe MD, Jeneen Rinks    . Essential hypertension, benign 02/22/2008   Qualifier: Diagnosis of  By: Lindell Noe MD, Jeneen Rinks     . Essential thrombocythemia ([place]) 01/31/11  . Family history of Alzheimer's disease 11/11/2013   Patient with family history of Alzheimers disease. She herself is very highly functional.  Plans for future visit with MMSE as primary function of the visit.    [name] GERD (gastroesophageal reflux disease) 08/16/2010  . HALLUX VALGUS, ACQUIRED 04/10/2009   Qualifier: Diagnosis of  By: [name] CMA,, Thekla    . Hypertension   . HYPERTRIGLYCERIDEMIA 01/05/2007   Qualifier: Diagnosis of  By: Oneida Alar MD, KARL    . Insomnia 02/10/2014  . Left-sided chest wall pain 12/19/2011    L sided chest wall pain follows a dermatomal distribution and raises the possibility of thoracic radiculopathic pain, which would be expected to improve with gabapentin as well. We discussed this at length and she will call to make me aware if this does not get better. Rib belt/girdle in the meantime; may consider rib films or CXR if not  improving.     . Metatarsalgia of right foot 11/28/2013   Referral to [facility]   . Osteopenia   . Personal history of radiation therapy    03/2017 right breast  . Restless leg syndrome 06/09/2011  . Restless legs syndrome (RLS)   . URINARY INCONTINENCE, URGE, MILD 11/27/2009   Qualifier: Diagnosis of  By: Zebedee Iba NP, Manuela Schwartz    . Vitamin D deficiency 08/16/2010    Family History  Problem Relation Age of Onset  . Heart disease Mother   . Tuberculosis Father   . Heart disease Sister   . Heart disease Brother   . Alzheimer's disease Brother   . Alzheimer's disease Brother     Past Surgical History:  Procedure Laterality Date  . BIOPSY BREAST Left 1998   Dr. Rebekah Chesterfield  .  BREAST EXCISIONAL BIOPSY    . BREAST LUMPECTOMY Right 02/20/2017   Procedure: RIGHT BREAST LUMPECTOMY;  Surgeon: Jovita Kussmaul, MD;  Location: [place];  Service: General;  Laterality: Right;  . BREAST LUMPECTOMY Left 02/20/2017   left lumpectomy  . BREAST LUMPECTOMY WITH RADIOACTIVE SEED LOCALIZATION Left 02/20/2017   Procedure: LEFT BREAST LUMPECTOMY WITH RADIOACTIVE SEED LOCALIZATION;  Surgeon: Jovita Kussmaul, MD;  Location: [place];  Service: General;  Laterality: Left;  . CATARACT EXTRACTION  2010   Dr. [name]  . CHOLECYSTECTOMY  1992  . COLONOSCOPY  2008  . MOHS SURGERY  2012   on face [facility]  . NAILBED REPAIR  12/05/2010   Procedure: NAILBED REPAIR;  Surgeon: Cammie Sickle., MD;  Location: [place];  Service: Orthopedics;  Laterality: Right;  full thickness nail biopsy right hallux  . torn tenden  2012   hand Dr. Orie Rout   Social History   Occupational History  . Occupation: Tree surgeon: UNEMPLOYED  . Occupation: Retired- Paramedic  Tobacco Use  . Smoking status: Former Smoker    Years: 3.00    Quit date: 1958    Years since quitting: 63.3  . Smokeless tobacco: Never Used  Substance and Sexual Activity  .  Alcohol use: Yes    Alcohol/week: 0.0 standard drinks    Comment: Wine rarely.  . Drug use: No  . Sexual activity: Not Currently

## 2019-05-16 ENCOUNTER — Other Ambulatory Visit: Payer: Self-pay | Admitting: Nurse Practitioner

## 2019-05-16 NOTE — Telephone Encounter (Signed)
Last prescribed by another prescriber, will send to [facility] for review

## 2019-05-22 ENCOUNTER — Other Ambulatory Visit: Payer: Self-pay | Admitting: Nurse Practitioner

## 2019-05-22 DIAGNOSIS — K219 Gastro-esophageal reflux disease without esophagitis: Secondary | ICD-10-CM

## 2019-05-24 ENCOUNTER — Non-Acute Institutional Stay: Payer: Medicare PPO | Admitting: Nurse Practitioner

## 2019-05-24 ENCOUNTER — Other Ambulatory Visit: Payer: Self-pay

## 2019-05-24 ENCOUNTER — Encounter: Payer: Self-pay | Admitting: Nurse Practitioner

## 2019-05-24 DIAGNOSIS — K219 Gastro-esophageal reflux disease without esophagitis: Secondary | ICD-10-CM

## 2019-05-24 DIAGNOSIS — N3941 Urge incontinence: Secondary | ICD-10-CM

## 2019-05-24 DIAGNOSIS — W19XXXA Unspecified fall, initial encounter: Secondary | ICD-10-CM | POA: Diagnosis not present

## 2019-05-24 DIAGNOSIS — I10 Essential (primary) hypertension: Secondary | ICD-10-CM | POA: Diagnosis not present

## 2019-05-24 DIAGNOSIS — D473 Essential (hemorrhagic) thrombocythemia: Secondary | ICD-10-CM

## 2019-05-24 DIAGNOSIS — K5909 Other constipation: Secondary | ICD-10-CM | POA: Diagnosis not present

## 2019-05-24 DIAGNOSIS — M549 Dorsalgia, unspecified: Secondary | ICD-10-CM | POA: Diagnosis not present

## 2019-05-24 DIAGNOSIS — G2581 Restless legs syndrome: Secondary | ICD-10-CM | POA: Diagnosis not present

## 2019-05-24 DIAGNOSIS — M79604 Pain in right leg: Secondary | ICD-10-CM

## 2019-05-24 MED ORDER — METOPROLOL TARTRATE 25 MG PO TABS: 12.5000 mg | ORAL_TABLET | Freq: Two times a day (BID) | ORAL | 0 refills | Status: DC

## 2019-05-24 NOTE — Progress Notes (Signed)
Location:   [place] of Service:  Clinic (12) Provider: Marlana Latus NP  Code Status: DNR Goals of Care: [place] 04/14/2019  Does Patient Have a Medical Advance Directive? Yes  Type of Paramedic of [place];Living will  Does patient want to make changes to medical advance directive? No - Patient declined  Copy of [place] in Chart? Yes - validated most recent copy scanned in chart (See row information)  Would patient like information on creating a medical advance directive? -     Chief Complaint  Patient presents with  . Acute Visit    Muscle spasms and bruises post fall on hands and face    HPI: Patient is a 86 y.o. female seen today for an acute visit for s/p fall last Thr, ecchymoses in face, bridge of nose tenderness, healing abrasion. The patient stated her feet got caught when walking, denied headache, dizziness, focal weakness, chest pain/pressure, or palpitation associated with the fall  Hx of RLS,  stable, on Gabapentin 300mg  qam 900mg  qpm. HTN, blood pressure is not controlled, on HCTZ 25mg  qd. Essential thrombocythemia stable, on Hydrea, plt. GERD, stable, on Omeprazole. Constipation, stable, on MiraLax qd, Colace qd. Urinary frequency, stable, on Vesicare 10mg  qd.    Lower back pain, radiculopathy R, Ortho 04/28/19, better, finished  Prednisone taper, muscle relaxer up to 2 x day, tolerated, Ibuprofen II up to 2x/day, pending PT.   Past Medical History:  Diagnosis Date  . Bladder cystocele 05/21/2010  . Complication of anesthesia    deglutition in history  . Deglutition disorder 08/17/2012   Single episode of choking/aspiration; preceded by trouble swallowing saliva (thin liquids).  For SLP evaluation. Aug 17, 2012.    [name] Disease of blood and blood forming organ 01/05/2007   Qualifier: Diagnosis of  By: Oneida Alar MD, KARL    . Diverticulitis   . DIVERTICULOSIS OF COLON 03/05/2006   Qualifier: Diagnosis of   By: Samara Snide    . DIZZINESS 01/18/2010   Qualifier: Diagnosis of  By: Lindell Noe MD, Jeneen Rinks    . Essential hypertension, benign 02/22/2008   Qualifier: Diagnosis of  By: Lindell Noe MD, Jeneen Rinks     . Essential thrombocythemia ([place]) 01/31/11  . Family history of Alzheimer's disease 11/11/2013   Patient with family history of Alzheimers disease. She herself is very highly functional.  Plans for future visit with MMSE as primary function of the visit.    [name] GERD (gastroesophageal reflux disease) 08/16/2010  . HALLUX VALGUS, ACQUIRED 04/10/2009   Qualifier: Diagnosis of  By: [name] CMA,, Thekla    . Hypertension   . HYPERTRIGLYCERIDEMIA 01/05/2007   Qualifier: Diagnosis of  By: Oneida Alar MD, KARL    . Insomnia 02/10/2014  . Left-sided chest wall pain 12/19/2011    L sided chest wall pain follows a dermatomal distribution and raises the possibility of thoracic radiculopathic pain, which would be expected to improve with gabapentin as well. We discussed this at length and she will call to make me aware if this does not get better. Rib belt/girdle in the meantime; may consider rib films or CXR if not improving.     . Metatarsalgia of right foot 11/28/2013   Referral to [facility]   . Osteopenia   . Personal history of radiation therapy    03/2017 right breast  . Restless leg syndrome 06/09/2011  . Restless legs syndrome (RLS)   . URINARY INCONTINENCE, [place], MILD 11/27/2009   Qualifier:  Diagnosis of  By: Zebedee Iba NP, Manuela Schwartz    . Vitamin D deficiency 08/16/2010    Past Surgical History:  Procedure Laterality Date  . BIOPSY BREAST Left 1998   Dr. Rebekah Chesterfield  . BREAST EXCISIONAL BIOPSY    . BREAST LUMPECTOMY Right 02/20/2017   Procedure: RIGHT BREAST LUMPECTOMY;  Surgeon: Jovita Kussmaul, MD;  Location: [place];  Service: General;  Laterality: Right;  . BREAST LUMPECTOMY Left 02/20/2017   left lumpectomy  . BREAST LUMPECTOMY WITH RADIOACTIVE SEED LOCALIZATION Left 02/20/2017   Procedure: LEFT BREAST LUMPECTOMY WITH  RADIOACTIVE SEED LOCALIZATION;  Surgeon: Jovita Kussmaul, MD;  Location: [place];  Service: General;  Laterality: Left;  . CATARACT EXTRACTION  2010   Dr. [name]  . CHOLECYSTECTOMY  1992  . COLONOSCOPY  2008  . MOHS SURGERY  2012   on face [facility]  . NAILBED REPAIR  12/05/2010   Procedure: NAILBED REPAIR;  Surgeon: Cammie Sickle., MD;  Location: [place];  Service: Orthopedics;  Laterality: Right;  full thickness nail biopsy right hallux  . torn tenden  2012   hand Dr. Orie Rout    Allergies  Allergen Reactions  . Doxycycline Nausea And Vomiting  . Macrodantin [Nitrofurantoin] Nausea And Vomiting  . Sulfamethoxazole-Trimethoprim Rash    REACTION: Rash after completing course of Septra    Allergies as of 05/24/2019      Reactions   Doxycycline Nausea And Vomiting   Macrodantin [nitrofurantoin] Nausea And Vomiting   Sulfamethoxazole-trimethoprim Rash   REACTION: Rash after completing course of Septra      Medication List       Accurate as of May 24, 2019  5:04 PM. If you have any questions, ask your nurse or doctor.        STOP taking these medications   docusate sodium 250 MG capsule Commonly known as: COLACE Stopped by: Demarius Archila X Korban Shearer, NP   predniSONE 5 MG (21) Tbpk tablet Commonly known as: STERAPRED UNI-PAK 21 TAB Stopped by: Santiago Graf X Mason Dibiasio, NP     TAKE these medications   aspirin 81 MG tablet Take 81 mg by mouth daily.   calcium carbonate 1500 (600 Ca) MG Tabs tablet Commonly known as: OSCAL Take 600 mg of elemental calcium by mouth in the morning and at bedtime.   cholecalciferol 1000 units tablet Commonly known as: VITAMIN D Take 2,000 Units by mouth daily.   FISH OIL PO Take by mouth.   gabapentin 300 MG capsule Commonly known as: Neurontin Take one tablet in am, three tables in pm   GLUCOSAMINE-CHONDROITIN PO Take by mouth. 1500-1200  Take two tablet daily   hydrochlorothiazide 25 MG  tablet Commonly known as: HYDRODIURIL TAKE 1 TABLET BY MOUTH EVERY DAY   hydroxyurea 500 MG capsule Commonly known as: HYDREA Take 2 capsules (1,000 mg total) by mouth daily. May take with food to minimize GI side effects. Take extra 1 tab at evening on Mondays and Thursdays   ibuprofen 200 MG tablet Commonly known as: ADVIL Take 400 mg by mouth as needed.   Magnesium 500 MG Tabs Take 1 tablet by mouth daily.   methocarbamol 500 MG tablet Commonly known as: ROBAXIN TAKE 1 TABLET BY MOUTH TWICE A DAY AS NEEDED FOR MUSCLE SPASM   metoprolol tartrate 25 MG tablet Commonly known as: LOPRESSOR Take 0.5 tablets (12.5 mg total) by mouth 2 (two) times daily. Started by: Brodee Mauritz X Zaccheaus Storlie, NP   omeprazole  20 MG capsule Commonly known as: PRILOSEC TAKE 1 CAPSULE BY MOUTH EVERY DAY   polyethylene glycol 17 g packet Commonly known as: MIRALAX / GLYCOLAX Take 17 g by mouth daily.   pyridOXINE 100 MG tablet Commonly known as: VITAMIN B-6 Take 100 mg by mouth daily.   solifenacin 10 MG tablet Commonly known as: VESIcare Take 1 tablet (10 mg total) by mouth daily. What changed:   how much to take  additional instructions       Review of Systems:  Review of Systems  Constitutional: Negative for activity change, appetite change, fatigue and fever.  HENT: Positive for hearing loss and postnasal drip. Negative for congestion and voice change.   Eyes: Negative for visual disturbance.  Respiratory: Positive for cough. Negative for shortness of breath and wheezing.        Chronic cough, post nasal drip  Cardiovascular: Positive for leg swelling. Negative for chest pain and palpitations.  Gastrointestinal: Negative for abdominal distention, abdominal pain and constipation.  Genitourinary: Positive for frequency. Negative for difficulty urinating, dysuria and urgency.  Musculoskeletal: Positive for arthralgias, back pain and gait problem.       R+L foot MTJ pain, chronic. Right lower  back, right hip, right leg pain x 2 months.   Skin: Negative for color change.  Neurological: Negative for speech difficulty, weakness, light-headedness and headaches.  Psychiatric/Behavioral: Negative for agitation, behavioral problems and sleep disturbance. The patient is not nervous/anxious.     Health Maintenance  Topic Date Due  . TETANUS/TDAP  04/11/2019  . INFLUENZA VACCINE  08/07/2019  . DEXA SCAN  Completed  . COVID-19 Vaccine  Completed  . PNA vac Low Risk Adult  Completed    Physical Exam: Vitals:   05/24/19 1604 05/24/19 1649  BP: (!) 172/70 (!) 180/88  Pulse: 81   Temp: 98.1 F (36.7 C)   SpO2: 95%   Weight: 130 lb 6.4 oz (59.1 kg)   Height: 5\' 3"  (1.6 m)    Body mass index is 23.1 kg/m. Physical Exam Vitals and nursing note reviewed.  Constitutional:      General: She is not in acute distress.    Appearance: Normal appearance. She is not ill-appearing.  HENT:     Head: Normocephalic and atraumatic.     Nose: Nose normal.     Mouth/Throat:     Mouth: Mucous membranes are moist.  Eyes:     Extraocular Movements: Extraocular movements intact.     Conjunctiva/sclera: Conjunctivae normal.     Pupils: Pupils are equal, round, and reactive to light.  Cardiovascular:     Rate and Rhythm: Normal rate and regular rhythm.     Heart sounds: Murmur present.  Pulmonary:     Breath sounds: No rales.  Abdominal:     General: Bowel sounds are normal. There is no distension.     Palpations: Abdomen is soft.     Tenderness: There is no abdominal tenderness. There is no right CVA tenderness, left CVA tenderness, guarding or rebound.  Musculoskeletal:     Cervical back: Normal range of motion and neck supple.     Right lower leg: Edema present.     Left lower leg: Edema present.     Comments: Very mild edema BLE. Lower back pain, radiculopathy R, Ortho 04/28/19, better Prednisone taper, muscle relaxer, PT.    Skin:    General: Skin is warm and dry.     Findings:  Bruising present.     Comments: Facial  bruise/contusion of nose  Neurological:     General: No focal deficit present.     Mental Status: She is alert and oriented to person, place, and time. Mental status is at baseline.     Motor: No weakness.     Coordination: Coordination normal.     Gait: Gait abnormal.  Psychiatric:        Mood and Affect: Mood normal.        Behavior: Behavior normal.        Thought Content: Thought content normal.        Judgment: Judgment normal.     Labs reviewed: Basic Metabolic Panel: Recent Labs    08/20/18 0942 02/15/19 0705 02/21/19 0949  NA 132* 134* 135  K 4.0 4.2 3.2*  CL 94* 97* 96*  CO2 28 29 29   GLUCOSE 92 90 82  BUN 16 14 10   CREATININE 0.87 0.74 0.74  CALCIUM 8.9 9.2 8.8*  TSH  --  4.04  --    Liver Function Tests: Recent Labs    08/20/18 0942 02/15/19 0705 02/21/19 0949  AST 22 30 24   ALT 26 30* 32  ALKPHOS 50  --  46  BILITOT 0.6 0.5 0.5  PROT 6.7 6.3 7.0  ALBUMIN 4.0  --  4.2   No results for input(s): LIPASE, AMYLASE in the last 8760 hours. No results for input(s): AMMONIA in the last 8760 hours. CBC: Recent Labs    08/20/18 0942 02/15/19 0705 04/21/19 0000  WBC 4.2 4.8 4.1  NEUTROABS 2.9 3,216 2,698  HGB 13.1 13.3 13.5  HCT 37.9 38.2 39  MCV 118.8* 112.7*  --   PLT 467* 562* 520*   Lipid Panel: Recent Labs    02/15/19 0705  CHOL 163  HDL 50  LDLCALC 94  TRIG 94  CHOLHDL 3.3   No results found for: HGBA1C  Procedures since last visit: No results found.  Assessment/Plan Essential hypertension, benign Blood pressure is not controlled, continue HCTZ, will start Metoprolol 12.5mg  bid, monitor Bp and hypertensive symptoms, headache, dizziness, change of vision, chest pain/pressure, palpitation. Call EMS, ED eval if needed.  The patient declined infirmary stay at [facility]  Chronic constipation Stable, continue MiraLax, Colace.   GERD (gastroesophageal reflux disease) Stable, continue Omeprazole.    Essential thrombocythemia ([place]) plt 520 04/21/2019, continue Hydrea.   URINARY INCONTINENCE, URGE, MILD Stable, continue Vesicare  Restless leg syndrome Stable, continue Gabapentin.   Pain of back and right lower extremity Lower back pain, radiculopathy R, Ortho 04/28/19, better, completed Prednisone taper, continue prn  muscle relaxer, Ibuprofen, pending.  PT.    Fall Last thr when her feet got caught while walking, resulted in facial contusion, no neurological deficits. Will have PT to eval, treat, assistive device recommendation.     Labs/tests ordered:  None  Next appt:  08/18/2019

## 2019-05-24 NOTE — Assessment & Plan Note (Signed)
Stable, continue Omeprazole.  

## 2019-05-24 NOTE — Assessment & Plan Note (Signed)
Stable, continue MiraLax, Colace.  

## 2019-05-24 NOTE — Assessment & Plan Note (Addendum)
Blood pressure is not controlled, continue HCTZ, will start Metoprolol 12.5mg  bid, monitor Bp and hypertensive symptoms, headache, dizziness, change of vision, chest pain/pressure, palpitation. Call EMS, ED eval if needed.  The patient declined infirmary stay at [place]

## 2019-05-24 NOTE — Assessment & Plan Note (Signed)
Stable, continue Gabapentin.

## 2019-05-24 NOTE — Assessment & Plan Note (Addendum)
Lower back pain, radiculopathy R, Ortho 04/28/19, better, completed Prednisone taper, continue prn  muscle relaxer, Ibuprofen, pending.  PT.

## 2019-05-24 NOTE — Assessment & Plan Note (Signed)
Stable, continue Vesicare

## 2019-05-24 NOTE — Assessment & Plan Note (Addendum)
plt 520 04/21/2019, continue Hydrea.

## 2019-05-24 NOTE — Assessment & Plan Note (Signed)
Last thr when her feet got caught while walking, resulted in facial contusion, no neurological deficits. Will have PT to eval, treat, assistive device recommendation.

## 2019-05-25 ENCOUNTER — Other Ambulatory Visit: Payer: Self-pay | Admitting: Hematology

## 2019-05-25 DIAGNOSIS — D473 Essential (hemorrhagic) thrombocythemia: Secondary | ICD-10-CM

## 2019-05-30 ENCOUNTER — Telehealth: Payer: Self-pay | Admitting: *Deleted

## 2019-05-30 NOTE — Telephone Encounter (Signed)
Patient called and stated that her blood pressure is still running high. Stated that it is ranging around 160/78.  Stated that she is/ was taking Metoprolol 25 mg twice daily and then she realized that she was suppose to be taking ONLY 1/2 tablet twice daily. Stated that her blood pressure was still up on the 25mg  twice daily.  Please Advise.

## 2019-05-31 ENCOUNTER — Non-Acute Institutional Stay: Payer: Medicare PPO | Admitting: Nurse Practitioner

## 2019-05-31 ENCOUNTER — Other Ambulatory Visit: Payer: Self-pay

## 2019-05-31 ENCOUNTER — Encounter: Payer: Self-pay | Admitting: Nurse Practitioner

## 2019-05-31 DIAGNOSIS — K5909 Other constipation: Secondary | ICD-10-CM

## 2019-05-31 DIAGNOSIS — N8110 Cystocele, unspecified: Secondary | ICD-10-CM | POA: Diagnosis not present

## 2019-05-31 DIAGNOSIS — I10 Essential (primary) hypertension: Secondary | ICD-10-CM | POA: Diagnosis not present

## 2019-05-31 DIAGNOSIS — D473 Essential (hemorrhagic) thrombocythemia: Secondary | ICD-10-CM | POA: Diagnosis not present

## 2019-05-31 DIAGNOSIS — G2581 Restless legs syndrome: Secondary | ICD-10-CM

## 2019-05-31 DIAGNOSIS — M8949 Other hypertrophic osteoarthropathy, multiple sites: Secondary | ICD-10-CM | POA: Diagnosis not present

## 2019-05-31 DIAGNOSIS — M150 Primary generalized (osteo)arthritis: Secondary | ICD-10-CM

## 2019-05-31 DIAGNOSIS — M159 Polyosteoarthritis, unspecified: Secondary | ICD-10-CM

## 2019-05-31 DIAGNOSIS — K219 Gastro-esophageal reflux disease without esophagitis: Secondary | ICD-10-CM

## 2019-05-31 NOTE — Assessment & Plan Note (Signed)
Stable, continue Gabapentin.

## 2019-05-31 NOTE — Assessment & Plan Note (Signed)
plt 520 04/21/19, continue Hydrea

## 2019-05-31 NOTE — Assessment & Plan Note (Signed)
Stable, continue MiraLax, Colace.  

## 2019-05-31 NOTE — Assessment & Plan Note (Signed)
Prn muscle relaxer, working with therapy.

## 2019-05-31 NOTE — Telephone Encounter (Signed)
Patient scheduled to be seen today in clinic

## 2019-05-31 NOTE — Assessment & Plan Note (Signed)
Urinary frequency is managed, continue Vesicare

## 2019-05-31 NOTE — Telephone Encounter (Signed)
Patient called back. Haven't heard from anyone regarding her message. Requested an appointment to see Jill Myers today at [facility]. Appointment scheduled for today at [facility]. Stated that she was seen there last time and knows where to go and will have someone bring her.  Patient is concerned with her blood pressure running high.

## 2019-05-31 NOTE — Telephone Encounter (Signed)
Mast, [name], NP  You 9 minutes ago (11:00 AM)   Please advise the patient to keep a log of BP in morning and evening, see me or Dr. Lyndel Safe ASAP. The patient Jill Myers needs [facility] infirmary stay to monitory BP if needed.   Message text         Patient has an appointment scheduled for today to be seen at [facility] as before.

## 2019-05-31 NOTE — Progress Notes (Signed)
Location:   clinic [place] of Service:  Clinic (12) Provider: Marlana Latus NP  Code Status: DNR Goals of Care: [place] Advanced Directives 04/14/2019  Does Patient Have a Medical Advance Directive? Yes  Type of Paramedic of [place];Living will  Does patient want to make changes to medical advance directive? No - Patient declined  Copy of [place] in Chart? Yes - validated most recent copy scanned in chart (See row information)  Would patient like information on creating a medical advance directive? -     Chief Complaint  Patient presents with  . Acute Visit    Consistent high blood pressure    HPI: Patient is a 86 y.o. female seen today for an acute visit for HTN, blood pressure is not well controlled, started Metoprolol 12.5mg  bid since 05/24/19.  Hx of RLS,  stable, on Gabapentin 300mg  qam 900mg  qpm. HTN, blood pressure is not controlled, on HCTZ 25mg  qd. Essential thrombocythemia stable, on Hydrea, plt 520 04/21/19/  GERD, stable, on Omeprazole. Constipation, stable, on MiraLax qd, Colace qd. Urinary frequency, stable, on Vesicare 10mg  qd.                        Lower back pain, radiculopathy R, Ortho 04/28/19, better, finished  Prednisone taper, muscle relaxer up to 2 x day, tolerated, Ibuprofen II up to 2x/day, working with therapy.    Past Medical History:  Diagnosis Date  . Bladder cystocele 05/21/2010  . Complication of anesthesia    deglutition in history  . Deglutition disorder 08/17/2012   Single episode of choking/aspiration; preceded by trouble swallowing saliva (thin liquids).  For SLP evaluation. Aug 17, 2012.    [name] Disease of blood and blood forming organ 01/05/2007   Qualifier: Diagnosis of  By: Oneida Alar MD, KARL    . Diverticulitis   . DIVERTICULOSIS OF COLON 03/05/2006   Qualifier: Diagnosis of  By: Samara Snide    . DIZZINESS 01/18/2010   Qualifier: Diagnosis of  By: Lindell Noe MD, Jeneen Rinks    . Essential hypertension, benign  02/22/2008   Qualifier: Diagnosis of  By: Lindell Noe MD, Jeneen Rinks     . Essential thrombocythemia ([place]) 01/31/11  . Family history of Alzheimer's disease 11/11/2013   Patient with family history of Alzheimers disease. She herself is very highly functional.  Plans for future visit with MMSE as primary function of the visit.    [name] GERD (gastroesophageal reflux disease) 08/16/2010  . HALLUX VALGUS, ACQUIRED 04/10/2009   Qualifier: Diagnosis of  By: [name] CMA,, [place]la    . Hypertension   . HYPERTRIGLYCERIDEMIA 01/05/2007   Qualifier: Diagnosis of  By: Oneida Alar MD, KARL    . Insomnia 02/10/2014  . Left-sided chest wall pain 12/19/2011    L sided chest wall pain follows a dermatomal distribution and raises the possibility of thoracic radiculopathic pain, which would be expected to improve with gabapentin as well. We discussed this at length and she will call to make me aware if this does not get better. Rib belt/girdle in the meantime; may consider rib films or CXR if not improving.     . Metatarsalgia of right foot 11/28/2013   Referral to [facility]   . Osteopenia   . Personal history of radiation therapy    03/2017 right breast  . Restless leg syndrome 06/09/2011  . Restless legs syndrome (RLS)   . URINARY INCONTINENCE, URGE, MILD 11/27/2009   Qualifier: Diagnosis of  By: Zebedee Iba NP, Manuela Schwartz    . Vitamin D deficiency 08/16/2010    Past Surgical History:  Procedure Laterality Date  . BIOPSY BREAST Left 1998   Dr. Rebekah Chesterfield  . BREAST EXCISIONAL BIOPSY    . BREAST LUMPECTOMY Right 02/20/2017   Procedure: RIGHT BREAST LUMPECTOMY;  Surgeon: Jovita Kussmaul, MD;  Location: [place];  Service: General;  Laterality: Right;  . BREAST LUMPECTOMY Left 02/20/2017   left lumpectomy  . BREAST LUMPECTOMY WITH RADIOACTIVE SEED LOCALIZATION Left 02/20/2017   Procedure: LEFT BREAST LUMPECTOMY WITH RADIOACTIVE SEED LOCALIZATION;  Surgeon: Jovita Kussmaul, MD;  Location: [place];  Service: General;   Laterality: Left;  . CATARACT EXTRACTION  2010   Dr. [name]  . CHOLECYSTECTOMY  1992  . COLONOSCOPY  2008  . MOHS SURGERY  2012   on face [facility]  . NAILBED REPAIR  12/05/2010   Procedure: NAILBED REPAIR;  Surgeon: Cammie Sickle., MD;  Location: [place];  Service: Orthopedics;  Laterality: Right;  full thickness nail biopsy right hallux  . torn tenden  2012   hand Dr. Orie Rout    Allergies  Allergen Reactions  . Doxycycline Nausea And Vomiting  . Macrodantin [Nitrofurantoin] Nausea And Vomiting  . Sulfamethoxazole-Trimethoprim Rash    REACTION: Rash after completing course of Septra    Allergies as of 05/31/2019      Reactions   Doxycycline Nausea And Vomiting   Macrodantin [nitrofurantoin] Nausea And Vomiting   Sulfamethoxazole-trimethoprim Rash   REACTION: Rash after completing course of Septra      Medication List       Accurate as of May 31, 2019  2:50 PM. If you have any questions, ask your nurse or doctor.        aspirin 81 MG tablet Take 81 mg by mouth daily.   calcium carbonate 1500 (600 Ca) MG Tabs tablet Commonly known as: OSCAL Take 600 mg of elemental calcium by mouth in the morning and at bedtime.   cholecalciferol 1000 units tablet Commonly known as: VITAMIN D Take 2,000 Units by mouth daily.   FISH OIL PO Take by mouth.   gabapentin 300 MG capsule Commonly known as: Neurontin Take one tablet in am, three tables in pm   GLUCOSAMINE-CHONDROITIN PO Take by mouth. 1500-1200  Take two tablet daily   hydrochlorothiazide 25 MG tablet Commonly known as: HYDRODIURIL TAKE 1 TABLET BY MOUTH EVERY DAY   hydroxyurea 500 MG capsule Commonly known as: HYDREA TAKE 2 CAPS BY MOUTH DAILY WITH FOOD TO MINIMIZE GI SIDE EFFECTS, MAY TAKE EXTRA CAP MON AND THUR   ibuprofen 200 MG tablet Commonly known as: ADVIL Take 400 mg by mouth as needed.   Magnesium 500 MG Tabs Take 1 tablet by mouth daily.     methocarbamol 500 MG tablet Commonly known as: ROBAXIN TAKE 1 TABLET BY MOUTH TWICE A DAY AS NEEDED FOR MUSCLE SPASM   metoprolol tartrate 25 MG tablet Commonly known as: LOPRESSOR Take 0.5 tablets (12.5 mg total) by mouth 2 (two) times daily.   omeprazole 20 MG capsule Commonly known as: PRILOSEC TAKE 1 CAPSULE BY MOUTH EVERY DAY   polyethylene glycol 17 g packet Commonly known as: MIRALAX / GLYCOLAX Take 17 g by mouth daily.   pyridOXINE 100 MG tablet Commonly known as: VITAMIN B-6 Take 100 mg by mouth daily.   solifenacin 10 MG tablet Commonly known as: VESIcare Take 1 tablet (10 mg total) by  mouth daily. What changed:   how much to take  additional instructions       Review of Systems:  Review of Systems  Constitutional: Negative for activity change, appetite change and fever.  HENT: Positive for hearing loss and postnasal drip. Negative for congestion and voice change.   Eyes: Negative for visual disturbance.  Respiratory: Positive for cough. Negative for chest tightness, shortness of breath and wheezing.        Chronic cough  Cardiovascular: Positive for leg swelling. Negative for chest pain and palpitations.  Gastrointestinal: Negative for abdominal distention, abdominal pain and constipation.  Genitourinary: Positive for frequency. Negative for dysuria and urgency.  Musculoskeletal: Positive for arthralgias, back pain and gait problem.       R+L foot MTJ pain, chronic. Right lower back, right hip, right leg pain is chronic, positional.   Skin: Negative for color change.  Neurological: Negative for dizziness, speech difficulty, weakness and headaches.  Psychiatric/Behavioral: Negative for agitation, behavioral problems and sleep disturbance. The patient is not nervous/anxious.     Health Maintenance  Topic Date Due  . TETANUS/TDAP  04/11/2019  . INFLUENZA VACCINE  08/07/2019  . DEXA SCAN  Completed  . COVID-19 Vaccine  Completed  . PNA vac Low Risk  Adult  Completed    Physical Exam: Vitals:   05/31/19 1418  BP: (!) 148/72  Pulse: 78  Temp: 97.9 F (36.6 C)  SpO2: 93%  Weight: 126 lb 14.4 oz (57.6 kg)  Height: 5\' 3"  (1.6 m)   Body mass index is 22.48 kg/m. Physical Exam Vitals and nursing note reviewed.  Constitutional:      Appearance: Normal appearance.  HENT:     Head: Normocephalic and atraumatic.     Mouth/Throat:     Mouth: Mucous membranes are moist.  Eyes:     Extraocular Movements: Extraocular movements intact.     Conjunctiva/sclera: Conjunctivae normal.     Pupils: Pupils are equal, round, and reactive to light.  Cardiovascular:     Rate and Rhythm: Normal rate and regular rhythm.     Heart sounds: Murmur present.  Pulmonary:     Breath sounds: No rales.  Abdominal:     General: Bowel sounds are normal. There is no distension.     Palpations: Abdomen is soft.     Tenderness: There is no abdominal tenderness.  Musculoskeletal:     Cervical back: Normal range of motion and neck supple.     Right lower leg: Edema present.     Left lower leg: Edema present.     Comments: Very mild edema BLE. Lower back pain, radiculopathy R, Ortho 04/28/19, better Prednisone taper, muscle relaxer, PT.    Skin:    General: Skin is warm and dry.  Neurological:     General: No focal deficit present.     Mental Status: She is alert and oriented to person, place, and time. Mental status is at baseline.     Motor: No weakness.     Coordination: Coordination normal.     Gait: Gait abnormal.  Psychiatric:        Mood and Affect: Mood normal.        Behavior: Behavior normal.        Thought Content: Thought content normal.        Judgment: Judgment normal.     Labs reviewed: Basic Metabolic Panel: Recent Labs    08/20/18 0942 02/15/19 0705 02/21/19 0949  NA 132* 134* 135  K 4.0  4.2 3.2*  CL 94* 97* 96*  CO2 28 29 29   GLUCOSE 92 90 82  BUN 16 14 10   CREATININE 0.87 0.74 0.74  CALCIUM 8.9 9.2 8.8*  TSH  --   4.04  --    Liver Function Tests: Recent Labs    08/20/18 0942 02/15/19 0705 02/21/19 0949  AST 22 30 24   ALT 26 30* 32  ALKPHOS 50  --  46  BILITOT 0.6 0.5 0.5  PROT 6.7 6.3 7.0  ALBUMIN 4.0  --  4.2   No results for input(s): LIPASE, AMYLASE in the last 8760 hours. No results for input(s): AMMONIA in the last 8760 hours. CBC: Recent Labs    08/20/18 0942 02/15/19 0705 04/21/19 0000  WBC 4.2 4.8 4.1  NEUTROABS 2.9 3,216 2,698  HGB 13.1 13.3 13.5  HCT 37.9 38.2 39  MCV 118.8* 112.7*  --   PLT 467* 562* 520*   Lipid Panel: Recent Labs    02/15/19 0705  CHOL 163  HDL 50  LDLCALC 94  TRIG 94  CHOLHDL 3.3   No results found for: HGBA1C  Procedures since last visit: No results found.  Assessment/Plan Essential hypertension, benign Improved Bp. Continue Metoprolol 12.5mg  bid, HCTZ 25mg  qd.   Chronic constipation Stable, continue MiraLax ,Colace.   GERD (gastroesophageal reflux disease) Stable, continue Omeprazole.   Osteoarthritis, multiple sites Prn muscle relaxer, working with therapy.  Cystocele, unspecified (CODE) Urinary frequency is managed, continue Vesicare  Essential thrombocythemia ([place]) plt 520 04/21/19, continue Hydrea  Restless leg syndrome Stable, continue Gabapentin.     Labs/tests ordered:  None  Next appt:  08/18/2019

## 2019-05-31 NOTE — Assessment & Plan Note (Signed)
Stable, continue Omeprazole.  

## 2019-05-31 NOTE — Assessment & Plan Note (Addendum)
Improved Bp. Continue Metoprolol 12.5mg  bid, HCTZ 25mg  qd.

## 2019-06-02 ENCOUNTER — Encounter: Payer: Self-pay | Admitting: Nurse Practitioner

## 2019-06-02 DIAGNOSIS — L720 Epidermal cyst: Secondary | ICD-10-CM | POA: Diagnosis not present

## 2019-06-02 DIAGNOSIS — L821 Other seborrheic keratosis: Secondary | ICD-10-CM | POA: Diagnosis not present

## 2019-06-02 DIAGNOSIS — D692 Other nonthrombocytopenic purpura: Secondary | ICD-10-CM | POA: Diagnosis not present

## 2019-06-02 DIAGNOSIS — L814 Other melanin hyperpigmentation: Secondary | ICD-10-CM | POA: Diagnosis not present

## 2019-06-02 DIAGNOSIS — D225 Melanocytic nevi of trunk: Secondary | ICD-10-CM | POA: Diagnosis not present

## 2019-06-02 DIAGNOSIS — D485 Neoplasm of uncertain behavior of skin: Secondary | ICD-10-CM | POA: Diagnosis not present

## 2019-06-02 DIAGNOSIS — Z85828 Personal history of other malignant neoplasm of skin: Secondary | ICD-10-CM | POA: Diagnosis not present

## 2019-06-02 DIAGNOSIS — L570 Actinic keratosis: Secondary | ICD-10-CM | POA: Diagnosis not present

## 2019-06-02 DIAGNOSIS — D1801 Hemangioma of skin and subcutaneous tissue: Secondary | ICD-10-CM | POA: Diagnosis not present

## 2019-06-09 DIAGNOSIS — C50512 Malignant neoplasm of lower-outer quadrant of left female breast: Secondary | ICD-10-CM | POA: Diagnosis not present

## 2019-06-09 DIAGNOSIS — Z170 Estrogen receptor positive status [ER+]: Secondary | ICD-10-CM | POA: Diagnosis not present

## 2019-06-09 DIAGNOSIS — C50211 Malignant neoplasm of upper-inner quadrant of right female breast: Secondary | ICD-10-CM | POA: Diagnosis not present

## 2019-06-16 ENCOUNTER — Other Ambulatory Visit: Payer: Self-pay | Admitting: Nurse Practitioner

## 2019-06-16 DIAGNOSIS — I10 Essential (primary) hypertension: Secondary | ICD-10-CM

## 2019-06-28 DIAGNOSIS — D696 Thrombocytopenia, unspecified: Secondary | ICD-10-CM | POA: Diagnosis not present

## 2019-07-12 ENCOUNTER — Other Ambulatory Visit: Payer: Self-pay | Admitting: Nurse Practitioner

## 2019-07-12 DIAGNOSIS — I10 Essential (primary) hypertension: Secondary | ICD-10-CM

## 2019-08-03 ENCOUNTER — Other Ambulatory Visit: Payer: Self-pay | Admitting: Nurse Practitioner

## 2019-08-03 DIAGNOSIS — I10 Essential (primary) hypertension: Secondary | ICD-10-CM

## 2019-08-18 ENCOUNTER — Other Ambulatory Visit: Payer: Self-pay

## 2019-08-18 ENCOUNTER — Encounter: Payer: Self-pay | Admitting: Nurse Practitioner

## 2019-08-18 ENCOUNTER — Non-Acute Institutional Stay: Payer: Medicare PPO | Admitting: Nurse Practitioner

## 2019-08-18 DIAGNOSIS — G2581 Restless legs syndrome: Secondary | ICD-10-CM

## 2019-08-18 DIAGNOSIS — M549 Dorsalgia, unspecified: Secondary | ICD-10-CM | POA: Diagnosis not present

## 2019-08-18 DIAGNOSIS — D473 Essential (hemorrhagic) thrombocythemia: Secondary | ICD-10-CM | POA: Diagnosis not present

## 2019-08-18 DIAGNOSIS — N3941 Urge incontinence: Secondary | ICD-10-CM

## 2019-08-18 DIAGNOSIS — K5909 Other constipation: Secondary | ICD-10-CM

## 2019-08-18 DIAGNOSIS — K219 Gastro-esophageal reflux disease without esophagitis: Secondary | ICD-10-CM | POA: Diagnosis not present

## 2019-08-18 DIAGNOSIS — M79604 Pain in right leg: Secondary | ICD-10-CM

## 2019-08-18 DIAGNOSIS — I10 Essential (primary) hypertension: Secondary | ICD-10-CM | POA: Diagnosis not present

## 2019-08-18 NOTE — Assessment & Plan Note (Signed)
Stable, continue Omeprazole.  

## 2019-08-18 NOTE — Assessment & Plan Note (Signed)
Lower back pain, radiculopathy R, Ortho 04/28/19, improved s/p Prednisone taper, not taking muscle relaxer,  Ibuprofen II prn working with therapy.

## 2019-08-18 NOTE — Progress Notes (Signed)
Location:   Clinic [facility]   Place of Service:  Clinic (12) Provider: Marlana Latus NP  Code Status: DNR Goals of Care:  Advanced Directives 04/14/2019  Does Patient Have a Medical Advance Directive? Yes  Type of Paramedic of [place];Living will  Does patient want to make changes to medical advance directive? No - Patient declined  Copy of [place] in Chart? Yes - validated most recent copy scanned in chart (See row information)  Would patient like information on creating a medical advance directive? -     Chief Complaint  Patient presents with   Medical Management of Chronic Issues    Patient returns to the clinic for follow up. She would like to discuss her routine with Lopressor and her arthrisits.    Health Maintenance    TDAP    HPI: Patient is a 86 y.o. female seen today for medical management of chronic diseases.    Top of feet L>R pain, ball part feet pain when walking, not new, Hx of Metatarsalgia, Morton's neuroma,  therapist suggested she might have psoriatic arthritis, no hx of psoriasis or present skin rash. Has dermatology 12/2019. Pain has no significant change, the patient declined workup for inflammatory process.  HTN, Metoprolol 12.5mg  bid since 05/24/19, HCTZ 25mg  qd             Hx of RLS, stable, on Gabapentin 300mg  qam 900mg  qpm.  Essential thrombocythemia stable, on Hydrea, plt 520 04/21/19/    GERD, stable, on Omeprazole.  Constipation, stable, on MiraLax qd, Colace qd.   Urinary frequency, stable, on Vesicare 10mg  qd.  Lower back pain, radiculopathy R, Ortho 04/28/19, improved s/p Prednisone taper, not taking muscle relaxer,  Ibuprofen II prn working with therapy.      Past Medical History:  Diagnosis Date   Bladder cystocele 07/05/5282   Complication of anesthesia    deglutition in history   Deglutition disorder 08/17/2012   Single episode of choking/aspiration; preceded by trouble  swallowing saliva (thin liquids).  For SLP evaluation. Aug 17, 2012.     Disease of blood and blood forming organ 01/05/2007   Qualifier: Diagnosis of  By: FIELDS MD, KARL     Diverticulitis    DIVERTICULOSIS OF COLON 03/05/2006   Qualifier: Diagnosis of  By: Samara Snide     DIZZINESS 01/18/2010   Qualifier: Diagnosis of  By: Lindell Noe MD, [place]     Essential hypertension, benign 02/22/2008   Qualifier: Diagnosis of  By: Lindell Noe MD, Jeneen Rinks      Essential thrombocythemia ([place]) 01/31/11   Family history of Alzheimer's disease 11/11/2013   Patient with family history of Alzheimers disease. She herself is very highly functional.  Plans for future visit with MMSE as primary function of the visit.     GERD (gastroesophageal reflux disease) 08/16/2010   HALLUX VALGUS, ACQUIRED 04/10/2009   Qualifier: Diagnosis of  By: [name] CMA,, Thekla     Hypertension    HYPERTRIGLYCERIDEMIA 01/05/2007   Qualifier: Diagnosis of  By: Oneida Alar MD, KARL     Insomnia 02/10/2014   Left-sided chest wall pain 12/19/2011    L sided chest wall pain follows a dermatomal distribution and raises the possibility of thoracic radiculopathic pain, which would be expected to improve with gabapentin as well. We discussed this at length and she will call to make me aware if this does not get better. Rib belt/girdle in the meantime; may consider rib films or CXR if not improving.  Metatarsalgia of right foot 11/28/2013   Referral to [facility]    Osteopenia    Personal history of radiation therapy    03/2017 right breast   Restless leg syndrome 06/09/2011   Restless legs syndrome (RLS)    URINARY INCONTINENCE, URGE, MILD 11/27/2009   Qualifier: Diagnosis of  By: Zebedee Iba NP, Clista Bernhardt D deficiency 08/16/2010    Past Surgical History:  Procedure Laterality Date   BIOPSY BREAST Left 1998   Dr. Rebekah Chesterfield   BREAST EXCISIONAL BIOPSY     BREAST LUMPECTOMY Right 02/20/2017   Procedure: RIGHT BREAST LUMPECTOMY;  Surgeon: Jovita Kussmaul, MD;  Location: [place];  Service: General;  Laterality: Right;   BREAST LUMPECTOMY Left 02/20/2017   left lumpectomy   BREAST LUMPECTOMY WITH RADIOACTIVE SEED LOCALIZATION Left 02/20/2017   Procedure: LEFT BREAST LUMPECTOMY WITH RADIOACTIVE SEED LOCALIZATION;  Surgeon: Jovita Kussmaul, MD;  Location: [place];  Service: General;  Laterality: Left;   CATARACT EXTRACTION  2010   Dr. [name]   CHOLECYSTECTOMY  1992   COLONOSCOPY  2008   [place]  2012   on face [facility]   NAILBED REPAIR  12/05/2010   Procedure: NAILBED REPAIR;  Surgeon: Cammie Sickle., MD;  Location: [place];  Service: Orthopedics;  Laterality: Right;  full thickness nail biopsy right hallux   torn tenden  2012   hand Dr. Orie Rout    Allergies  Allergen Reactions   Doxycycline Nausea And Vomiting   Macrodantin [Nitrofurantoin] Nausea And Vomiting   Sulfamethoxazole-Trimethoprim Rash    REACTION: Rash after completing course of Septra    Allergies as of 08/18/2019      Reactions   Doxycycline Nausea And Vomiting   Macrodantin [nitrofurantoin] Nausea And Vomiting   Sulfamethoxazole-trimethoprim Rash   REACTION: Rash after completing course of Septra      Medication List       Accurate as of August 18, 2019  4:59 PM. If you have any questions, ask your nurse or doctor.        aspirin 81 MG tablet Take 81 mg by mouth daily.   calcium carbonate 1500 (600 Ca) MG Tabs tablet Commonly known as: OSCAL Take 600 mg of elemental calcium by mouth in the morning and at bedtime.   cholecalciferol 1000 units tablet Commonly known as: VITAMIN D Take 2,000 Units by mouth daily.   FISH OIL PO Take by mouth.   gabapentin 300 MG capsule Commonly known as: Neurontin Take one tablet in am, three tables in pm   GLUCOSAMINE-CHONDROITIN PO Take by mouth. 1500-1200  Take two tablet daily   hydrochlorothiazide 25 MG  tablet Commonly known as: HYDRODIURIL TAKE 1 TABLET BY MOUTH EVERY DAY   hydroxyurea 500 MG capsule Commonly known as: HYDREA TAKE 2 CAPS BY MOUTH DAILY WITH FOOD TO MINIMIZE GI SIDE EFFECTS, MAY TAKE EXTRA CAP MON AND THUR   ibuprofen 200 MG tablet Commonly known as: ADVIL Take 400 mg by mouth as needed.   Magnesium 500 MG Tabs Take 1 tablet by mouth daily.   methocarbamol 500 MG tablet Commonly known as: ROBAXIN TAKE 1 TABLET BY MOUTH TWICE A DAY AS NEEDED FOR MUSCLE SPASM   metoprolol tartrate 25 MG tablet Commonly known as: LOPRESSOR TAKE 1/2 TABLET BY MOUTH TWICE A DAY   omeprazole 20 MG capsule Commonly known as: PRILOSEC TAKE 1 CAPSULE BY MOUTH EVERY DAY   polyethylene glycol  17 g packet Commonly known as: MIRALAX / GLYCOLAX Take 17 g by mouth daily.   pyridOXINE 100 MG tablet Commonly known as: VITAMIN B-6 Take 100 mg by mouth daily.   solifenacin 10 MG tablet Commonly known as: VESIcare Take 1 tablet (10 mg total) by mouth daily. What changed:   how much to take  additional instructions       Review of Systems:  Review of Systems  Constitutional: Negative for appetite change, fatigue and fever.  HENT: Positive for hearing loss and postnasal drip. Negative for congestion and voice change.   Eyes: Negative for visual disturbance.  Respiratory: Positive for cough. Negative for chest tightness, shortness of breath and wheezing.        Chronic cough  Cardiovascular: Positive for leg swelling. Negative for chest pain and palpitations.  Gastrointestinal: Negative for abdominal pain and constipation.  Genitourinary: Positive for frequency. Negative for dysuria and urgency.  Musculoskeletal: Positive for arthralgias, back pain and gait problem.       R+L foot MTJ pain, top of feet pain  chronic. Right lower back, right hip, right leg pain is chronic, positional improved.   Skin: Negative for color change.  Neurological: Negative for speech difficulty,  weakness, light-headedness and headaches.  Psychiatric/Behavioral: Negative for agitation, behavioral problems and sleep disturbance. The patient is not nervous/anxious.     Health Maintenance  Topic Date Due   TETANUS/TDAP  04/11/2019   INFLUENZA VACCINE  08/07/2019   DEXA SCAN  Completed   COVID-19 Vaccine  Completed   PNA vac Low Risk Adult  Completed    Physical Exam: Vitals:   08/18/19 1330  BP: 118/66  Pulse: 65  Temp: 97.8 F (36.6 C)  SpO2: 95%  Weight: 127 lb 3.2 oz (57.7 kg)  Height: 5\' 3"  (1.6 m)   Body mass index is 22.53 kg/m. Physical Exam Vitals and nursing note reviewed.  Constitutional:      Appearance: Normal appearance.  HENT:     Head: Normocephalic and atraumatic.     Mouth/Throat:     Mouth: Mucous membranes are moist.  Eyes:     Extraocular Movements: Extraocular movements intact.     Conjunctiva/sclera: Conjunctivae normal.     Pupils: Pupils are equal, round, and reactive to light.  Cardiovascular:     Rate and Rhythm: Normal rate and regular rhythm.     Heart sounds: Murmur heard.   Pulmonary:     Breath sounds: No rales.  Abdominal:     General: Bowel sounds are normal. There is no distension.     Palpations: Abdomen is soft.     Tenderness: There is no abdominal tenderness.  Musculoskeletal:     Cervical back: Normal range of motion and neck supple.     Right lower leg: Edema present.     Left lower leg: Edema present.     Comments: Very mild edema BLE. Lower back pain, radiculopathy R, Ortho 04/28/19, better Prednisone taper, muscle relaxer, PT.    Skin:    General: Skin is warm and dry.  Neurological:     General: No focal deficit present.     Mental Status: She is alert and oriented to person, place, and time. Mental status is at baseline.     Motor: No weakness.     Coordination: Coordination normal.     Gait: Gait abnormal.  Psychiatric:        Mood and Affect: Mood normal.        Behavior:  Behavior normal.         Thought Content: Thought content normal.        Judgment: Judgment normal.     Labs reviewed: Basic Metabolic Panel: Recent Labs    08/20/18 0942 02/15/19 0705 02/21/19 0949  NA 132* 134* 135  K 4.0 4.2 3.2*  CL 94* 97* 96*  CO2 28 29 29   GLUCOSE 92 90 82  BUN 16 14 10   CREATININE 0.87 0.74 0.74  CALCIUM 8.9 9.2 8.8*  TSH  --  4.04  --    Liver Function Tests: Recent Labs    08/20/18 0942 02/15/19 0705 02/21/19 0949  AST 22 30 24   ALT 26 30* 32  ALKPHOS 50  --  46  BILITOT 0.6 0.5 0.5  PROT 6.7 6.3 7.0  ALBUMIN 4.0  --  4.2   No results for input(s): LIPASE, AMYLASE in the last 8760 hours. No results for input(s): AMMONIA in the last 8760 hours. CBC: Recent Labs    08/20/18 0942 02/15/19 0705 04/21/19 0000  WBC 4.2 4.8 4.1  NEUTROABS 2.9 3,216 2,698  HGB 13.1 13.3 13.5  HCT 37.9 38.2 39  MCV 118.8* 112.7*  --   PLT 467* 562* 520*   Lipid Panel: Recent Labs    02/15/19 0705  CHOL 163  HDL 50  LDLCALC 94  TRIG 94  CHOLHDL 3.3   No results found for: HGBA1C  Procedures since last visit: No results found.  Assessment/Plan  Pain of back and right lower extremity Lower back pain, radiculopathy R, Ortho 04/28/19, improved s/p Prednisone taper, not taking muscle relaxer,  Ibuprofen II prn working with therapy.    URINARY INCONTINENCE, URGE, MILD Urinary frequency, stable, on Vesicare 10mg  qd.   Chronic constipation Constipation, stable, on MiraLax qd, Colace qd.    GERD (gastroesophageal reflux disease) Stable, continue Omeprazole.   Hypertension Blood pressure is controlled, continue Metoprolol, HCTZ  Essential thrombocythemia ([facility]) Essential thrombocythemia stable, on Hydrea, plt 520 04/21/19/    Restless leg syndrome Hx of RLS, stable, on Gabapentin 300mg  qam 900mg  qpm.    Labs/tests ordered: none  Next appt:  3-4 months with Dr. Lyndel Safe

## 2019-08-18 NOTE — Assessment & Plan Note (Signed)
Constipation, stable, on MiraLax qd, Colace qd.

## 2019-08-18 NOTE — Assessment & Plan Note (Signed)
Hx of RLS, stable, on Gabapentin 300mg  qam 900mg  qpm.

## 2019-08-18 NOTE — Assessment & Plan Note (Signed)
Blood pressure is controlled, continue Metoprolol, HCTZ

## 2019-08-18 NOTE — Assessment & Plan Note (Signed)
Urinary frequency, stable, on Vesicare 10mg  qd.

## 2019-08-18 NOTE — Assessment & Plan Note (Signed)
Essential thrombocythemia stable, on Hydrea, plt 520 04/21/19/

## 2019-08-19 NOTE — Progress Notes (Signed)
[place]   [phone] [phone]   Clinic Follow up Note   Patient Care Team: Mast, [name], NP as PCP - General (Internal Medicine) [name], MD as Consulting Physician (Dermatology) [name], MD as Consulting Physician ([place]) Jovita Kussmaul, MD as Consulting Physician (General Surgery) Raye Sorrow, MD as Consulting Physician (Pulmonary Disease) Truitt Merle, MD as Consulting Physician (Hematology) Gery Pray, MD as Consulting Physician (Radiation Oncology) Delice Bison, Charlestine Massed, NP as Nurse Practitioner (Hematology and Oncology) Annia Belt, MD as Consulting Physician (Oncology)  Date of Service:  08/22/2019  CHIEF COMPLAINT: Follow up of right and left breastand ET  SUMMARY OF ONCOLOGIC HISTORY: Oncology History Overview Note  Cancer Staging Malignant neoplasm of upper-inner quadrant of right breast in female, estrogen receptor positive ([place]) Staging form: Breast, AJCC 8th Edition - Clinical stage from 01/07/2017: Stage IA (cT1c, cN0, cM0, G3, ER: Positive, PR: Positive, HER2: Negative) - Signed by Truitt Merle, MD on 01/20/2017  Malignant neoplasm of upper-outer quadrant of left breast in female, estrogen receptor positive ([place]) Staging form: Breast, AJCC 8th Edition - Clinical stage from 01/07/2017: Stage IA (cT1b, cN0, cM0, G2, ER: Positive, PR: Positive, HER2: Negative) - Signed by Truitt Merle, MD on 01/20/2017     Malignant neoplasm of upper-inner quadrant of right breast in female, estrogen receptor positive ([place])  01/01/2017 Mammogram   IMPRESSION: 1. 2.0 cm mass in the 2 o'clock position of the right breast with imaging features highly suspicious for malignancy. 2. 0.7 cm mass in the 2 o'clock position of the left breast with imaging features highly suspicious for malignancy. 3. No abnormal appearing axillary lymph nodes on either side.   01/07/2017 Receptors her2   Right Breast: Prognostic  indicators significant for: ER, 95% positive and PR, 50% positive, both with strong staining intensity. Proliferation marker Ki67 at 30. HER2 negative. RATIO OF HER2/CEP17 SIGNALS 0.81 AVERAGE HER2 COPY NUMBER PER CELL 1.70  Left Breast: Prognostic indicators significant for: ER, 100% positive and PR, 100% positive, both with strong staining intensity. Proliferation marker Ki67 at 10. HER2 negative. RATIO OF HER2/CEP17 SIGNALS 1.75 AVERAGE HER2 COPY NUMBER PER CELL 3.15   01/07/2017 Initial Biopsy   Diagnosis 1. Breast, right, needle core biopsy, 2:00 o'clock - INVASIVE DUCTAL CARCINOMA, SEE COMMENT. 2. Breast, left, needle core biopsy, 2:00 o'clock - INVASIVE MAMMARY CARCINOMA, SEE COMMENT.    01/20/2017 Initial Diagnosis   Malignant neoplasm of upper-inner quadrant of right breast in female, estrogen receptor positive ([place])   01/2017 - 03/2018 Anti-estrogen oral therapy   Anastrozole 1 mg daily started on 01/2017, stopped around 01/2018 due to diffuse joint pain. She switched to Exemestane on 03/26/2018 but stopped after 1 week due to gluteal pain.    02/20/2017 Surgery   RIGHT BREAST LUMPECTOMY (Right) LEFT BREAST LUMPECTOMY WITH RADIOACTIVE SEED LOCALIZATION (Left)  Per Dr. Marlou Starks    02/20/2017 Pathology Results    Diagnosis 1. Breast, lumpectomy, Left w/ seed - INVASIVE DUCTAL CARCINOMA, GRADE I/III, SPANNING 1.1 CM. - THE SURGICAL RESECTION MARGINS ARE NEGATIVE FOR CARCINOMA. - SEE ONCOLOGY TABLE BELOW. 2. Breast, lumpectomy, Right - INVASIVE DUCTAL CARCINOMA, GRADE II/III, SPANNING 2.1 CM. - DUCTAL CARCINOMA IN SITU, INTERMEDIATE GRADE. - PERINEURAL INVASION IS IDENTIFIED. - THE SURGICAL RESECTION MARGINS ARE NEGATIVE FOR CARCINOMA. - SEE ONCOLOGY TABLE BELOW. 3. Breast, excision, Right inferior - LOBULAR NEOPLASIA (ATYPICAL LOBULAR HYPERPLASIA). - FAT NECROSIS. - SEE COMMENT.  Microscopic Comment 1. BREAST, INVASIVE TUMOR (LEFT) Procedure: Seed localized  lumpectomy Laterality: Left Tumor Size: 1.1 cm (gross measurement). Histologic Type: Ductal Grade: I Tubular Differentiation: 2 Nuclear Pleomorphism: 1 Mitotic Count: 1 Ductal Carcinoma in Situ (DCIS): Not identified Extent of Tumor: Confined to breast parenchyma. Margins: Greater than 0.2 cm to all margins. Regional Lymph Nodes: None examined. Breast Prognostic Profile: Case [id number] Estrogen Receptor: 100%, strong Progesterone Receptor: 100%, strong Her2: No amplification was detected. The ratio was 0.81 Ki-67: 10% Best tumor block for sendout testing: 1B Pathologic Stage Classification (pTNM, AJCC 8th Edition): Primary Tumor (pT): pT1c Regional Lymph Nodes (pN): pNX Distant Metastases (pM): pMX  2. BREAST, INVASIVE TUMOR (RIGHT) Procedure: Lumpectomy Laterality: Right Tumor Size: 2.1 cm (gross measurement) Histologic Type: Ductal Grade: II Tubular Differentiation: 3 Nuclear Pleomorphism: 2 Mitotic Count: 1 Ductal Carcinoma in Situ (DCIS): Present, intermediate grade Extent of Tumor: Confined to breast parenchyma. Margins: Greater than 0.2 cm to all margins Regional Lymph Nodes: None examined. Breast Prognostic Profile: Case [id number] Estrogen Receptor: 95%, strong Progesterone Receptor: 50%, strong Her2: No amplification was detected. The ratio was 0.81 Ki-67: 30% Best tumor block for sendout testing: 2A Pathologic Stage Classification (pTNM, AJCC 8th Edition): Primary Tumor (pT): pT2 Regional Lymph Nodes (pN): pNX Distant Metastases (pM): pMX 3. The surgical resection margin(s) of the specimen were inked and microscopically evaluated.   03/31/2017 - 05/11/2017 Radiation Therapy   1. Right Breast / 50 Gy in 25 fractions 2. Right Axilla / 45 Gy in 25 fractions 3. Right Breast Boost / 10 Gy in 5 fractions   Malignant neoplasm of upper-outer quadrant of left breast in female, estrogen receptor positive ([place])  01/20/2017 Initial Diagnosis   Malignant  neoplasm of upper-outer quadrant of left breast in female, estrogen receptor positive ([place])   02/20/2017 Surgery   RIGHT BREAST LUMPECTOMY (Right) LEFT BREAST LUMPECTOMY WITH RADIOACTIVE SEED LOCALIZATION (Left)  Per Dr. Marlou Starks     02/12/2018 Mammogram   IMPRESSION: No mammographic evidence of breast malignancy       CURRENT THERAPY:  -Breast Cancer Surveillance -Hydrea 1070m daily, dose increased in Jan 2020. Increased to Hydrea dose of 5062mBID except 100033mn the AM on Mondays and Thursdays on 02/21/19.  -Zometa every 6 months for 2 years starting8/14/20    INTERVAL HISTORY: Jill Myers here for a follow up ET and Breast cancer. She presents to the clinic alone. She notes she is doing well. She has Sciatica pain and has been seeing orthopedist. She was given 5 day course steroids and muscle relaxer as needed. She did PT which resolved the pain but since completing her pain returned. She continues to do exercise. She notes she has cane but does not use is all the time.  She had a bad fall 2 months ago after left leg weakness. She fell on her face and injured her nose. She recovered well.  She takes Hydrea 1000m56md with extra 500mg68mMondays and Thursdays. She is tolerating well.    REVIEW OF SYSTEMS:   Constitutional: Denies fevers, chills or abnormal weight loss Eyes: Denies blurriness of vision Ears, nose, mouth, throat, and face: Denies mucositis or sore throat Respiratory: Denies cough, dyspnea or wheezes Cardiovascular: Denies palpitation, chest discomfort or lower extremity swelling Gastrointestinal:  Denies nausea, heartburn or change in bowel habits Skin: Denies abnormal skin rashes MSK: (+) Right hip Sciatica pain  Lymphatics: Denies new lymphadenopathy or easy bruising Neurological:Denies numbness, tingling or new weaknesses Behavioral/Psych: Mood is stable, no new changes  All other systems were reviewed  with the patient and are negative.  MEDICAL  HISTORY:  Past Medical History:  Diagnosis Date  . Bladder cystocele 05/21/2010  . Complication of anesthesia    deglutition in history  . Deglutition disorder 08/17/2012   Single episode of choking/aspiration; preceded by trouble swallowing saliva (thin liquids).  For SLP evaluation. Aug 17, 2012.    [name] Disease of blood and blood forming organ 01/05/2007   Qualifier: Diagnosis of  By: Oneida Alar MD, KARL    . Diverticulitis   . DIVERTICULOSIS OF COLON 03/05/2006   Qualifier: Diagnosis of  By: Samara Snide    . DIZZINESS 01/18/2010   Qualifier: Diagnosis of  By: Lindell Noe MD, Jeneen Rinks    . Essential hypertension, benign 02/22/2008   Qualifier: Diagnosis of  By: Lindell Noe MD, Jeneen Rinks     . Essential thrombocythemia ([place]) 01/31/11  . Family history of Alzheimer's disease 11/11/2013   Patient with family history of Alzheimers disease. She herself is very highly functional.  Plans for future visit with MMSE as primary function of the visit.    [name] GERD (gastroesophageal reflux disease) 08/16/2010  . HALLUX VALGUS, ACQUIRED 04/10/2009   Qualifier: Diagnosis of  By: [name] CMA,, [place]    . Hypertension   . HYPERTRIGLYCERIDEMIA 01/05/2007   Qualifier: Diagnosis of  By: Oneida Alar MD, KARL    . Insomnia 02/10/2014  . Left-sided chest wall pain 12/19/2011    L sided chest wall pain follows a dermatomal distribution and raises the possibility of thoracic radiculopathic pain, which would be expected to improve with gabapentin as well. We discussed this at length and she will call to make me aware if this does not get better. Rib belt/girdle in the meantime; may consider rib films or CXR if not improving.     . Metatarsalgia of right foot 11/28/2013   Ref[facility]   . Osteopenia   . Personal history of radiation therapy    03/2017 right breast  . Restless leg syndrome 06/09/2011  . Restless legs syndrome (RLS)   . URINARY INCONTINENCE, URGE, MILD 11/27/2009   Qualifier: Diagnosis of  By: Zebedee Iba NP, Manuela Schwartz    . Vitamin D  deficiency 08/16/2010    SURGICAL HISTORY: Past Surgical History:  Procedure Laterality Date  . BIOPSY BREAST Left 1998   Dr. Rebekah Chesterfield  . BREAST EXCISIONAL BIOPSY    . BREAST LUMPECTOMY Right 02/20/2017   Procedure: RIGHT BREAST LUMPECTOMY;  Surgeon: Jovita Kussmaul, MD;  Location: [facility];  Service: General;  Laterality: Right;  . BREAST LUMPECTOMY Left 02/20/2017   left lumpectomy  . BREAST LUMPECTOMY WITH RADIOACTIVE SEED LOCALIZATION Left 02/20/2017   Procedure: LEFT BREAST LUMPECTOMY WITH RADIOACTIVE SEED LOCALIZATION;  Surgeon: Jovita Kussmaul, MD;  Location: [facility];  Service: General;  Laterality: Left;  . CATARACT EXTRACTION  2010   Dr. [name]  . CHOLECYSTECTOMY  1992  . COLONOSCOPY  2008  . MOHS SURGERY  2012   on face [facility] [place]  . NAILBED REPAIR  12/05/2010   Procedure: NAILBED REPAIR;  Surgeon: Cammie Sickle., MD;  Location: [facility];  Service: Orthopedics;  Laterality: Right;  full thickness nail biopsy right hallux  . torn tenden  2012   hand Dr. Orie Rout    I have reviewed the social history and family history with the patient and they are unchanged from previous note.  ALLERGIES:  is allergic to doxycycline, macrodantin [nitrofurantoin], and sulfamethoxazole-trimethoprim.  MEDICATIONS:  Current Outpatient Medications  Medication Sig Dispense Refill  . aspirin 81 MG tablet Take 81 mg by mouth daily.      . calcium carbonate (OSCAL) 1500 (600 Ca) MG TABS tablet Take 600 mg of elemental calcium by mouth in the morning and at bedtime.     . cholecalciferol (VITAMIN D) 1000 UNITS tablet Take 2,000 Units by mouth daily.     [name] gabapentin (NEURONTIN) 300 MG capsule Take one tablet in am, three tables in pm 360 capsule 2  . GLUCOSAMINE-CHONDROITIN PO Take by mouth. 1500-1200  Take two tablet daily    . hydrochlorothiazide (HYDRODIURIL) 25 MG tablet TAKE 1 TABLET BY MOUTH EVERY DAY 90 tablet 1  .  hydroxyurea (HYDREA) 500 MG capsule TAKE 2 CAPS BY MOUTH DAILY WITH FOOD TO MINIMIZE GI SIDE EFFECTS, MAY TAKE EXTRA CAP MON AND THUR 210 capsule 1  . ibuprofen (ADVIL,MOTRIN) 200 MG tablet Take 400 mg by mouth as needed.     . Magnesium 500 MG TABS Take 1 tablet by mouth daily.    . methocarbamol (ROBAXIN) 500 MG tablet TAKE 1 TABLET BY MOUTH TWICE A DAY AS NEEDED FOR MUSCLE SPASM 60 tablet 1  . metoprolol tartrate (LOPRESSOR) 25 MG tablet TAKE 1/2 TABLET BY MOUTH TWICE A DAY 30 tablet 0  . Omega-3 Fatty Acids (FISH OIL PO) Take by mouth.    [name] omeprazole (PRILOSEC) 20 MG capsule TAKE 1 CAPSULE BY MOUTH EVERY DAY 90 capsule 1  . polyethylene glycol (MIRALAX / GLYCOLAX) packet Take 17 g by mouth daily.      . potassium chloride SA (KLOR-CON) 20 MEQ tablet Take 1 tablet (20 mEq total) by mouth 2 (two) times daily. 30 tablet 0  . pyridOXINE (VITAMIN B-6) 100 MG tablet Take 100 mg by mouth daily.    . solifenacin (VESICARE) 10 MG tablet Take 1 tablet (10 mg total) by mouth daily. (Patient taking differently: Take by mouth daily. Patient currently taking 2.5 mg daily) 45 tablet 3   No current facility-administered medications for this visit.    PHYSICAL EXAMINATION: ECOG PERFORMANCE STATUS: 2 - Symptomatic, <50% confined to bed  Vitals:   08/22/19 1034  BP: 130/67  Pulse: 62  Resp: 16  Temp: 97.9 F (36.6 C)  SpO2: 96%   Filed Weights   08/22/19 1034  Weight: 125 lb 14.4 oz (57.1 kg)    GENERAL:alert, no distress and comfortable SKIN: skin color, texture, turgor are normal, no rashes or significant lesions EYES: normal, Conjunctiva are pink and non-injected, sclera clear  NECK: supple, thyroid normal size, non-tender, without nodularity LYMPH:  no palpable lymphadenopathy in the cervical, axillary  LUNGS: clear to auscultation and percussion with normal breathing effort HEART: regular rate & rhythm and no murmurs and no lower extremity edema ABDOMEN:abdomen soft, non-tender and  normal bowel sounds  Musculoskeletal:no cyanosis of digits and no clubbing  NEURO: alert & oriented x 3 with fluent speech, no focal motor/sensory deficits BREAST: S/p b/l lumpectomy: Surgical incision healed well. Breast exam benign.    LABORATORY DATA:  I have reviewed the data as listed CBC Latest Ref Rng & Units 08/22/2019 04/21/2019 02/15/2019  WBC 4.0 - 10.5 K/uL 3.4(L) 4.1 4.8  Hemoglobin 12.0 - 15.0 g/dL 12.7 13.5 13.3  Hematocrit 36 - 46 % 35.9(L) 39 38.2  Platelets 150 - 400 K/uL 358 520(A) 562(H)     CMP Latest Ref Rng & Units 08/22/2019 02/21/2019 02/15/2019  Glucose 70 - 99 mg/dL 105(H) 82 90  BUN 8 -  23 mg/dL 12 10 14   Creatinine 0.44 - 1.00 mg/dL 0.78 0.74 0.74  Sodium 135 - 145 mmol/L 130(L) 135 134(L)  Potassium 3.5 - 5.1 mmol/L 3.2(L) 3.2(L) 4.2  Chloride 98 - 111 mmol/L 96(L) 96(L) 97(L)  CO2 22 - 32 mmol/L 24 29 29   Calcium 8.9 - 10.3 mg/dL 9.2 8.8(L) 9.2  Total Protein 6.5 - 8.1 g/dL 6.5 7.0 6.3  Total Bilirubin 0.3 - 1.2 mg/dL 0.7 0.5 0.5  Alkaline Phos 38 - 126 U/L 39 46 -  AST 15 - 41 U/L 18 24 30   ALT 0 - 44 U/L 19 32 30(H)      RADIOGRAPHIC STUDIES: I have personally reviewed the radiological images as listed and agreed with the findings in the report. No results found.   ASSESSMENT & PLAN:  Jill Myers is a 86 y.o. female with    1. Bilateral breast cancer, invasive ductal carcinoma, right upper inner quadrant, pT2N0M0, stage IA, ER+/PR+/HER2-, G3, left upper-outer quadrant,pT1cN0M0, stage IA, ER+/PR+/HER2-, G2 -She was diagnosed on 01/20/2017, and is s/pbilateral lumpectomy with negative margins,followed by adjuvant right breastand axillaryradiation. -She startedadjuvantAnastrozole on 1/2019but held around 01/2018 due to bilateral hip pain. Her joint pain is now resolved. She started Exemestane in 03/2017, but stopped after 1 week due to gluteal pain.  -Given her very early stage breast cancer and her age,poor tolerance, will hold  adjuvant antiestrogen therapy. -From a breast cancer standpoint, she is clinically doing well. Lab reviewed, her CBC and CMP are within normal limits except WBC 3.4, MCV 116.6, plt 358K, K 3.2, BG 105, Sodium 130. Her physical exam and her 02/2019 mammogram were unremarkable. There is no clinical concern for recurrence. -I think her right hip/buttock pain is unlikely related to her breast cancer, will  -Continue surveillance. Next mammogram in 02/2020 -f/u in 6 months    2.Essential thrombocythemia -OnHydrea dose currently 1000 mg= 2 capsules daily  -She is on ASA to reduce risk of thrombosis -Goal to keep plt<500k. She has been on Hydrea 1023m daily, tolerating well. Currently on Hydrea dose of 5065mBID except 100039mn the AM on Mondays and Thursdays.  -Labs reviewed, Her platelet normal at 358K today. Will continue Hydrea at same dose, 1000m3md with extra 500mg64mMondays and Thursdays -Repeat labs in 1 and 3 months at [facility] or PCP. She can contact clinic after each labs.    3.Joint pain,Morton's neuroma of third interspaces of both feet,Restless leg syndrome, recent right sciatica pain  -Any execrated joint pain from AI improved since stopping it -She continues to takeGabapentin 300mga50mght, prn Ibuprofen. -She is able to mostly manage her arthritic pain with moderate [facility]  -She has Sciatica pain in 02/2019, mainly of her right hip. She will continue seeing orthopedic surgeon, muscle relaxer and PT exercises. It's overall better, intermittent  -After fall 2 months ago she mildly injured her nose. I strongly recommend she ambulate with cane and try to keep her phone nearby her.    4. Osteopenia  -She had a DEXA on 02/12/2018 and it showed aT-score of -2.2at right hip. Her overall fracture risk of 16%. -To strengthen her bone she started Zometa infusion q6month42month14/20 for 2  years. I encouraged her to watch for bone pain. She can take ibuprofen. -I encouraged her to maintain dental health and hygiene. -I encouraged her to continue her calcium and vitDsupplement  5. HTN, GERD -on HCTZ, Omeprazole  -F/u PCP  6. Hypokalemia  -Her K is 3.2 today (08/22/19).  -She is on HCTZ which can lower her potassium level. I will call in oral potassium   PLAN:  -I called in oral potassium today for a month, she will f/u with PCP with repeated lab   -Labs reviewed, will continue Hydrea at same dose 1072m BID with extra 5040mon Mondays and Thursdays.  -Will proceed with Zometa today  -Lab in 3 months  -Lab and F/u and Zometa infusion in 6 months    No problem-specific Assessment & Plan notes found for this encounter.   No orders of the defined types were placed in this encounter.  All questions were answered. The patient knows to call the clinic with any problems, questions or concerns. No barriers to learning was detected. The total time spent in the appointment was 30 minutes.     YaTruitt MerleMD 08/22/2019   I, AmJoslyn Devonam acting as scribe for YaTruitt MerleMD.   I have reviewed the above documentation for accuracy and completeness, and I agree with the above.

## 2019-08-22 ENCOUNTER — Inpatient Hospital Stay: Payer: Medicare PPO | Admitting: Hematology

## 2019-08-22 ENCOUNTER — Inpatient Hospital Stay: Payer: Medicare PPO

## 2019-08-22 ENCOUNTER — Inpatient Hospital Stay: Payer: Medicare PPO | Attending: Hematology

## 2019-08-22 ENCOUNTER — Other Ambulatory Visit: Payer: Self-pay

## 2019-08-22 ENCOUNTER — Encounter: Payer: Self-pay | Admitting: Hematology

## 2019-08-22 VITALS — BP 130/67 | HR 62 | Temp 97.9°F | Resp 16 | Ht 63.0 in | Wt 125.9 lb

## 2019-08-22 DIAGNOSIS — M8580 Other specified disorders of bone density and structure, unspecified site: Secondary | ICD-10-CM | POA: Insufficient documentation

## 2019-08-22 DIAGNOSIS — I10 Essential (primary) hypertension: Secondary | ICD-10-CM | POA: Insufficient documentation

## 2019-08-22 DIAGNOSIS — C50412 Malignant neoplasm of upper-outer quadrant of left female breast: Secondary | ICD-10-CM

## 2019-08-22 DIAGNOSIS — Z79899 Other long term (current) drug therapy: Secondary | ICD-10-CM | POA: Diagnosis not present

## 2019-08-22 DIAGNOSIS — Z7982 Long term (current) use of aspirin: Secondary | ICD-10-CM | POA: Insufficient documentation

## 2019-08-22 DIAGNOSIS — Z9049 Acquired absence of other specified parts of digestive tract: Secondary | ICD-10-CM | POA: Insufficient documentation

## 2019-08-22 DIAGNOSIS — C50211 Malignant neoplasm of upper-inner quadrant of right female breast: Secondary | ICD-10-CM | POA: Insufficient documentation

## 2019-08-22 DIAGNOSIS — M85851 Other specified disorders of bone density and structure, right thigh: Secondary | ICD-10-CM

## 2019-08-22 DIAGNOSIS — K219 Gastro-esophageal reflux disease without esophagitis: Secondary | ICD-10-CM | POA: Insufficient documentation

## 2019-08-22 DIAGNOSIS — Z79811 Long term (current) use of aromatase inhibitors: Secondary | ICD-10-CM | POA: Diagnosis not present

## 2019-08-22 DIAGNOSIS — Z170 Estrogen receptor positive status [ER+]: Secondary | ICD-10-CM

## 2019-08-22 LAB — CBC WITH DIFFERENTIAL (CANCER CENTER ONLY)
Abs Immature Granulocytes: 0 10*3/uL (ref 0.00–0.07)
Band Neutrophils: 1 %
Basophils Absolute: 0 10*3/uL (ref 0.0–0.1)
Basophils Relative: 1 %
Eosinophils Absolute: 0 10*3/uL (ref 0.0–0.5)
Eosinophils Relative: 1 %
HCT: 35.9 % — ABNORMAL LOW (ref 36.0–46.0)
Hemoglobin: 12.7 g/dL (ref 12.0–15.0)
Lymphocytes Relative: 13 %
Lymphs Abs: 0.4 10*3/uL — ABNORMAL LOW (ref 0.7–4.0)
MCH: 41.2 pg — ABNORMAL HIGH (ref 26.0–34.0)
MCHC: 35.4 g/dL (ref 30.0–36.0)
MCV: 116.6 fL — ABNORMAL HIGH (ref 80.0–100.0)
Monocytes Absolute: 0.5 10*3/uL (ref 0.1–1.0)
Monocytes Relative: 14 %
Neutro Abs: 2.4 10*3/uL (ref 1.7–7.7)
Neutrophils Relative %: 70 %
Platelet Count: 358 10*3/uL (ref 150–400)
RBC: 3.08 MIL/uL — ABNORMAL LOW (ref 3.87–5.11)
RDW: 13.1 % (ref 11.5–15.5)
WBC Count: 3.4 10*3/uL — ABNORMAL LOW (ref 4.0–10.5)
nRBC: 0 % (ref 0.0–0.2)

## 2019-08-22 LAB — CMP (CANCER CENTER ONLY)
ALT: 19 U/L (ref 0–44)
AST: 18 U/L (ref 15–41)
Albumin: 3.9 g/dL (ref 3.5–5.0)
Alkaline Phosphatase: 39 U/L (ref 38–126)
Anion gap: 10 (ref 5–15)
BUN: 12 mg/dL (ref 8–23)
CO2: 24 mmol/L (ref 22–32)
Calcium: 9.2 mg/dL (ref 8.9–10.3)
Chloride: 96 mmol/L — ABNORMAL LOW (ref 98–111)
Creatinine: 0.78 mg/dL (ref 0.44–1.00)
GFR, Est AFR Am: >60 mL/min (ref >60)
GFR, Estimated: >60 mL/min (ref >60)
Glucose, Bld: 105 mg/dL — ABNORMAL HIGH (ref 70–99)
Potassium: 3.2 mmol/L — ABNORMAL LOW (ref 3.5–5.1)
Sodium: 130 mmol/L — ABNORMAL LOW (ref 135–145)
Total Bilirubin: 0.7 mg/dL (ref 0.3–1.2)
Total Protein: 6.5 g/dL (ref 6.5–8.1)

## 2019-08-22 MED ORDER — SODIUM CHLORIDE 0.9 % IV SOLN
Freq: Once | INTRAVENOUS | Status: AC
  Filled 2019-08-22: qty 250

## 2019-08-22 MED ORDER — ZOLEDRONIC ACID 4 MG/100ML IV SOLN
4.0000 mg | Freq: Once | INTRAVENOUS | Status: AC
  Administered 2019-08-22: 4 mg via INTRAVENOUS

## 2019-08-22 MED ORDER — ZOLEDRONIC ACID 4 MG/100ML IV SOLN
INTRAVENOUS | Status: AC
  Filled 2019-08-22: qty 100

## 2019-08-22 MED ORDER — POTASSIUM CHLORIDE CRYS ER 20 MEQ PO TBCR: 20.0000 meq | EXTENDED_RELEASE_TABLET | Freq: Two times a day (BID) | ORAL | 0 refills | Status: DC

## 2019-08-22 NOTE — Patient Instructions (Signed)
Zoledronic Acid injection (Hypercalcemia, Oncology) What is this medicine? ZOLEDRONIC ACID (ZOE le dron ik AS id) lowers the amount of calcium loss from bone. It is used to treat too much calcium in your blood from cancer. It is also used to prevent complications of cancer that has spread to the bone. This medicine may be used for other purposes; ask your health care provider or pharmacist if you have questions. COMMON BRAND NAME(S): Zometa What should I tell my health care provider before I take this medicine? They need to know if you have any of these conditions:  aspirin-sensitive asthma  cancer, especially if you are receiving medicines used to treat cancer  dental disease or wear dentures  infection  kidney disease  receiving corticosteroids like dexamethasone or prednisone  an unusual or allergic reaction to zoledronic acid, other medicines, foods, dyes, or preservatives  pregnant or trying to get pregnant  breast-feeding How should I use this medicine? This medicine is for infusion into a vein. It is given by a health care professional in a hospital or clinic setting. Talk to your pediatrician regarding the use of this medicine in children. Special care may be needed. Overdosage: If you think you have taken too much of this medicine contact a poison control center or emergency room at once. NOTE: This medicine is only for you. Do not share this medicine with others. What if I miss a dose? It is important not to miss your dose. Call your doctor or health care professional if you are unable to keep an appointment. What may interact with this medicine?  certain antibiotics given by injection  NSAIDs, medicines for pain and inflammation, like ibuprofen or naproxen  some diuretics like bumetanide, furosemide  teriparatide  thalidomide This list may not describe all possible interactions. Give your health care provider a list of all the medicines, herbs, non-prescription  drugs, or dietary supplements you use. Also tell them if you smoke, drink alcohol, or use illegal drugs. Some items may interact with your medicine. What should I watch for while using this medicine? Visit your doctor or health care professional for regular checkups. It may be some time before you see the benefit from this medicine. Do not stop taking your medicine unless your doctor tells you to. Your doctor may order blood tests or other tests to see how you are doing. Women should inform their doctor if they wish to become pregnant or think they might be pregnant. There is a potential for serious side effects to an unborn child. Talk to your health care professional or pharmacist for more information. You should make sure that you get enough calcium and vitamin D while you are taking this medicine. Discuss the foods you eat and the vitamins you take with your health care professional. Some people who take this medicine have severe bone, joint, and/or muscle pain. This medicine may also increase your risk for jaw problems or a broken thigh bone. Tell your doctor right away if you have severe pain in your jaw, bones, joints, or muscles. Tell your doctor if you have any pain that does not go away or that gets worse. Tell your dentist and dental surgeon that you are taking this medicine. You should not have major dental surgery while on this medicine. See your dentist to have a dental exam and fix any dental problems before starting this medicine. Take good care of your teeth while on this medicine. Make sure you see your dentist for regular follow-up   appointments. What side effects may I notice from receiving this medicine? Side effects that you should report to your doctor or health care professional as soon as possible:  allergic reactions like skin rash, itching or hives, swelling of the face, lips, or tongue  anxiety, confusion, or depression  breathing problems  changes in vision  eye  pain  feeling faint or lightheaded, falls  jaw pain, especially after dental work  mouth sores  muscle cramps, stiffness, or weakness  redness, blistering, peeling or loosening of the skin, including inside the mouth  trouble passing urine or change in the amount of urine Side effects that usually do not require medical attention (report to your doctor or health care professional if they continue or are bothersome):  bone, joint, or muscle pain  constipation  diarrhea  fever  hair loss  irritation at site where injected  loss of appetite  nausea, vomiting  stomach upset  trouble sleeping  trouble swallowing  weak or tired This list may not describe all possible side effects. Call your doctor for medical advice about side effects. You may report side effects to FDA at 1-800-FDA-1088. Where should I keep my medicine? This drug is given in a hospital or clinic and will not be stored at home. NOTE: This sheet is a summary. It may not cover all possible information. If you have questions about this medicine, talk to your doctor, pharmacist, or health care provider.  2020 Elsevier/Gold Standard (2013-05-21 14:19:39)  

## 2019-08-23 ENCOUNTER — Telehealth: Payer: Self-pay | Admitting: Hematology

## 2019-08-23 NOTE — Telephone Encounter (Signed)
Scheduled per 8/16 los. Unable to contact pt. Left voicemail with appt times and dates

## 2019-08-26 ENCOUNTER — Other Ambulatory Visit: Payer: Self-pay | Admitting: Nurse Practitioner

## 2019-08-26 DIAGNOSIS — I10 Essential (primary) hypertension: Secondary | ICD-10-CM

## 2019-08-30 ENCOUNTER — Other Ambulatory Visit: Payer: Self-pay | Admitting: Hematology

## 2019-09-19 ENCOUNTER — Other Ambulatory Visit: Payer: Self-pay | Admitting: Hematology

## 2019-09-21 DIAGNOSIS — Z83511 Family history of glaucoma: Secondary | ICD-10-CM | POA: Diagnosis not present

## 2019-09-21 DIAGNOSIS — H02834 Dermatochalasis of left upper eyelid: Secondary | ICD-10-CM | POA: Diagnosis not present

## 2019-09-21 DIAGNOSIS — H0102A Squamous blepharitis right eye, upper and lower eyelids: Secondary | ICD-10-CM | POA: Diagnosis not present

## 2019-09-21 DIAGNOSIS — H04123 Dry eye syndrome of bilateral lacrimal glands: Secondary | ICD-10-CM | POA: Diagnosis not present

## 2019-09-21 DIAGNOSIS — Z961 Presence of intraocular lens: Secondary | ICD-10-CM | POA: Diagnosis not present

## 2019-09-21 DIAGNOSIS — H0102B Squamous blepharitis left eye, upper and lower eyelids: Secondary | ICD-10-CM | POA: Diagnosis not present

## 2019-09-21 DIAGNOSIS — H02831 Dermatochalasis of right upper eyelid: Secondary | ICD-10-CM | POA: Diagnosis not present

## 2019-09-27 ENCOUNTER — Other Ambulatory Visit: Payer: Self-pay | Admitting: Nurse Practitioner

## 2019-09-27 DIAGNOSIS — I10 Essential (primary) hypertension: Secondary | ICD-10-CM

## 2019-10-03 ENCOUNTER — Encounter: Payer: Self-pay | Admitting: Hematology

## 2019-10-03 NOTE — Telephone Encounter (Signed)
I spoke with Jill Myers.  Appt for lab and f/u with YF have been made for 10/10/2019.  She verbalized understanding.

## 2019-10-07 NOTE — Progress Notes (Signed)
[place]   [phone] Fax:([phone]) [phone]   Clinic Follow up Note   Patient Care Team: Mast, [name], NP as PCP - General (Internal Medicine) [name], MD as Consulting Physician (Dermatology) [name], MD as Consulting Physician ([place]) Jovita Kussmaul, MD as Consulting Physician (General Surgery) Raye Sorrow, MD as Consulting Physician (Pulmonary Disease) Truitt Merle, MD as Consulting Physician (Hematology) Gery Pray, MD as Consulting Physician (Radiation Oncology) Delice Bison, Charlestine Massed, NP as Nurse Practitioner (Hematology and Oncology) Annia Belt, MD as Consulting Physician (Oncology)  Date of Service:  10/10/2019  CHIEF COMPLAINT: right breat lump and pain   SUMMARY OF ONCOLOGIC HISTORY: Oncology History Overview Note  Cancer Staging Malignant neoplasm of upper-inner quadrant of right breast in female, estrogen receptor positive ([place]) Staging form: Breast, AJCC 8th Edition - Clinical stage from 01/07/2017: Stage IA (cT1c, cN0, cM0, G3, ER: Positive, PR: Positive, HER2: Negative) - Signed by Truitt Merle, MD on 01/20/2017  Malignant neoplasm of upper-outer quadrant of left breast in female, estrogen receptor positive ([place]) Staging form: Breast, AJCC 8th Edition - Clinical stage from 01/07/2017: Stage IA (cT1b, cN0, cM0, G2, ER: Positive, PR: Positive, HER2: Negative) - Signed by Truitt Merle, MD on 01/20/2017     Malignant neoplasm of upper-inner quadrant of right breast in female, estrogen receptor positive ([place])  01/01/2017 Mammogram   IMPRESSION: 1. 2.0 cm mass in the 2 o'clock position of the right breast with imaging features highly suspicious for malignancy. 2. 0.7 cm mass in the 2 o'clock position of the left breast with imaging features highly suspicious for malignancy. 3. No abnormal appearing axillary lymph nodes on either side.   01/07/2017 Receptors her2   Right Breast: Prognostic indicators significant  for: ER, 95% positive and PR, 50% positive, both with strong staining intensity. Proliferation marker Ki67 at 30. HER2 negative. RATIO OF HER2/CEP17 SIGNALS 0.81 AVERAGE HER2 COPY NUMBER PER CELL 1.70  Left Breast: Prognostic indicators significant for: ER, 100% positive and PR, 100% positive, both with strong staining intensity. Proliferation marker Ki67 at 10. HER2 negative. RATIO OF HER2/CEP17 SIGNALS 1.75 AVERAGE HER2 COPY NUMBER PER CELL 3.15   01/07/2017 Initial Biopsy   Diagnosis 1. Breast, right, needle core biopsy, 2:00 o'clock - INVASIVE DUCTAL CARCINOMA, SEE COMMENT. 2. Breast, left, needle core biopsy, 2:00 o'clock - INVASIVE MAMMARY CARCINOMA, SEE COMMENT.    01/20/2017 Initial Diagnosis   Malignant neoplasm of upper-inner quadrant of right breast in female, estrogen receptor positive ([place])   01/2017 - 03/2018 Anti-estrogen oral therapy   Anastrozole 1 mg daily started on 01/2017, stopped around 01/2018 due to diffuse joint pain. She switched to Exemestane on 03/26/2018 but stopped after 1 week due to gluteal pain.    02/20/2017 Surgery   RIGHT BREAST LUMPECTOMY (Right) LEFT BREAST LUMPECTOMY WITH RADIOACTIVE SEED LOCALIZATION (Left)  Per Dr. Marlou Starks    02/20/2017 Pathology Results    Diagnosis 1. Breast, lumpectomy, Left w/ seed - INVASIVE DUCTAL CARCINOMA, GRADE I/III, SPANNING 1.1 CM. - THE SURGICAL RESECTION MARGINS ARE NEGATIVE FOR CARCINOMA. - SEE ONCOLOGY TABLE BELOW. 2. Breast, lumpectomy, Right - INVASIVE DUCTAL CARCINOMA, GRADE II/III, SPANNING 2.1 CM. - DUCTAL CARCINOMA IN SITU, INTERMEDIATE GRADE. - PERINEURAL INVASION IS IDENTIFIED. - THE SURGICAL RESECTION MARGINS ARE NEGATIVE FOR CARCINOMA. - SEE ONCOLOGY TABLE BELOW. 3. Breast, excision, Right inferior - LOBULAR NEOPLASIA (ATYPICAL LOBULAR HYPERPLASIA). - FAT NECROSIS. - SEE COMMENT.  Microscopic Comment 1. BREAST, INVASIVE TUMOR (LEFT) Procedure: Seed localized lumpectomy Laterality:  Left Tumor  Size: 1.1 cm (gross measurement). Histologic Type: Ductal Grade: I Tubular Differentiation: 2 Nuclear Pleomorphism: 1 Mitotic Count: 1 Ductal Carcinoma in Situ (DCIS): Not identified Extent of Tumor: Confined to breast parenchyma. Margins: Greater than 0.2 cm to all margins. Regional Lymph Nodes: None examined. Breast Prognostic Profile: Case [id number] Estrogen Receptor: 100%, strong Progesterone Receptor: 100%, strong Her2: No amplification was detected. The ratio was 0.81 Ki-67: 10% Best tumor block for sendout testing: 1B Pathologic Stage Classification (pTNM, AJCC 8th Edition): Primary Tumor (pT): pT1c Regional Lymph Nodes (pN): pNX Distant Metastases (pM): pMX  2. BREAST, INVASIVE TUMOR (RIGHT) Procedure: Lumpectomy Laterality: Right Tumor Size: 2.1 cm (gross measurement) Histologic Type: Ductal Grade: II Tubular Differentiation: 3 Nuclear Pleomorphism: 2 Mitotic Count: 1 Ductal Carcinoma in Situ (DCIS): Present, intermediate grade Extent of Tumor: Confined to breast parenchyma. Margins: Greater than 0.2 cm to all margins Regional Lymph Nodes: None examined. Breast Prognostic Profile: Case [id number] Estrogen Receptor: 95%, strong Progesterone Receptor: 50%, strong Her2: No amplification was detected. The ratio was 0.81 Ki-67: 30% Best tumor block for sendout testing: 2A Pathologic Stage Classification (pTNM, AJCC 8th Edition): Primary Tumor (pT): pT2 Regional Lymph Nodes (pN): pNX Distant Metastases (pM): pMX 3. The surgical resection margin(s) of the specimen were inked and microscopically evaluated.   03/31/2017 - 05/11/2017 Radiation Therapy   1. Right Breast / 50 Gy in 25 fractions 2. Right Axilla / 45 Gy in 25 fractions 3. Right Breast Boost / 10 Gy in 5 fractions   Malignant neoplasm of upper-outer quadrant of left breast in female, estrogen receptor positive ([place])  01/20/2017 Initial Diagnosis   Malignant neoplasm of upper-outer quadrant of left  breast in female, estrogen receptor positive ([place])   02/20/2017 Surgery   RIGHT BREAST LUMPECTOMY (Right) LEFT BREAST LUMPECTOMY WITH RADIOACTIVE SEED LOCALIZATION (Left)  Per Dr. Marlou Starks     02/12/2018 Mammogram   IMPRESSION: No mammographic evidence of breast malignancy       CURRENT THERAPY:  -Breast Cancer Surveillance -Hydrea 1051m daily, dose increased in Jan 2020. Currently on10053mdaily in morning with extra 50078mn Mondays and Thursdays. -Zometa every 6 months for 2 years starting8/14/20  INTERVAL HISTORY:  Jill Myers here for a new concern of right breast pain and a lump at her scar. She presents to the clinic alone. She notes 1 week ago she felt a right upper inner breast mass. She denies nipple discharge or skin change. She other wise feels stable and no other new changes. She is taking hydrea. She notes she last saw her surgeon 4 months ago. She notes she has gotten labs at [facility] occasionally.    REVIEW OF SYSTEMS:   Constitutional: Denies fevers, chills or abnormal weight loss Eyes: Denies blurriness of vision Ears, nose, mouth, throat, and face: Denies mucositis or sore throat Respiratory: Denies cough, dyspnea or wheezes Cardiovascular: Denies palpitation, chest discomfort or lower extremity swelling Gastrointestinal:  Denies nausea, heartburn or change in bowel habits Skin: Denies abnormal skin rashes Lymphatics: Denies new lymphadenopathy or easy bruising Neurological:Denies numbness, tingling or new weaknesses Behavioral/Psych: Mood is stable, no new changes  All other systems were reviewed with the patient and are negative. Breast: (+) Right breast pain with mass  MEDICAL HISTORY:  Past Medical History:  Diagnosis Date  . Bladder cystocele 05/21/2010  . Complication of anesthesia    deglutition in history  . Deglutition disorder 08/17/2012   Single episode of choking/aspiration; preceded by trouble swallowing saliva (thin  liquids).   For SLP evaluation. Aug 17, 2012.    [name] Disease of blood and blood forming organ 01/05/2007   Qualifier: Diagnosis of  By: Oneida Alar MD, KARL    . Diverticulitis   . DIVERTICULOSIS OF COLON 03/05/2006   Qualifier: Diagnosis of  By: Samara Snide    . DIZZINESS 01/18/2010   Qualifier: Diagnosis of  By: Lindell Noe MD, Jeneen Rinks    . Essential hypertension, benign 02/22/2008   Qualifier: Diagnosis of  By: Lindell Noe MD, Jeneen Rinks     . Essential thrombocythemia ([place]) 01/31/11  . Family history of Alzheimer's disease 11/11/2013   Patient with family history of Alzheimers disease. She herself is very highly functional.  Plans for future visit with MMSE as primary function of the visit.    [name] GERD (gastroesophageal reflux disease) 08/16/2010  . HALLUX VALGUS, ACQUIRED 04/10/2009   Qualifier: Diagnosis of  By: [name] CMA,, [place]    . Hypertension   . HYPERTRIGLYCERIDEMIA 01/05/2007   Qualifier: Diagnosis of  By: Oneida Alar MD, KARL    . Insomnia 02/10/2014  . Left-sided chest wall pain 12/19/2011    L sided chest wall pain follows a dermatomal distribution and raises the possibility of thoracic radiculopathic pain, which would be expected to improve with gabapentin as well. We discussed this at length and she will call to make me aware if this does not get better. Rib belt/girdle in the meantime; may consider rib films or CXR if not improving.     . Metatarsalgia of right foot 11/28/2013   Referral to [facility]   . Osteopenia   . Personal history of radiation therapy    03/2017 right breast  . Restless leg syndrome 06/09/2011  . Restless legs syndrome (RLS)   . URINARY INCONTINENCE, URGE, MILD 11/27/2009   Qualifier: Diagnosis of  By: Zebedee Iba NP, Manuela Schwartz    . Vitamin D deficiency 08/16/2010    SURGICAL HISTORY: Past Surgical History:  Procedure Laterality Date  . BIOPSY BREAST Left 1998   Dr. Rebekah Chesterfield  . BREAST EXCISIONAL BIOPSY    . BREAST LUMPECTOMY Right 02/20/2017   Procedure: RIGHT BREAST LUMPECTOMY;  Surgeon: Jovita Kussmaul, MD;   Location: [place];  Service: General;  Laterality: Right;  . BREAST LUMPECTOMY Left 02/20/2017   left lumpectomy  . BREAST LUMPECTOMY WITH RADIOACTIVE SEED LOCALIZATION Left 02/20/2017   Procedure: LEFT BREAST LUMPECTOMY WITH RADIOACTIVE SEED LOCALIZATION;  Surgeon: Jovita Kussmaul, MD;  Location: [place];  Service: General;  Laterality: Left;  . CATARACT EXTRACTION  2010   Dr. [name]  . CHOLECYSTECTOMY  1992  . COLONOSCOPY  2008  . MOHS SURGERY  2012   on face [facility] [place]  . NAILBED REPAIR  12/05/2010   Procedure: NAILBED REPAIR;  Surgeon: Cammie Sickle., MD;  Location: [place];  Service: Orthopedics;  Laterality: Right;  full thickness nail biopsy right hallux  . torn tenden  2012   hand Dr. Orie Rout    I have reviewed the social history and family history with the patient and they are unchanged from previous note.  ALLERGIES:  is allergic to doxycycline, macrodantin [nitrofurantoin], and sulfamethoxazole-trimethoprim.  MEDICATIONS:  Current Outpatient Medications  Medication Sig Dispense Refill  . aspirin 81 MG tablet Take 81 mg by mouth daily.      . calcium carbonate (OSCAL) 1500 (600 Ca) MG TABS tablet Take 600 mg of elemental calcium by mouth in the morning and at bedtime.     [name]  cholecalciferol (VITAMIN D) 1000 UNITS tablet Take 2,000 Units by mouth daily.     [name] gabapentin (NEURONTIN) 300 MG capsule TAKE 1 CAPSULE BY MOUTH EVERY MORNING AND TAKE 3 CAPSULES BY MOUTH EVERY EVENING 360 capsule 1  . GLUCOSAMINE-CHONDROITIN PO Take by mouth. 1500-1200  Take two tablet daily    . hydrochlorothiazide (HYDRODIURIL) 25 MG tablet TAKE 1 TABLET BY MOUTH EVERY DAY 90 tablet 1  . hydroxyurea (HYDREA) 500 MG capsule TAKE 2 CAPS BY MOUTH DAILY WITH FOOD TO MINIMIZE GI SIDE EFFECTS, MAY TAKE EXTRA CAP MON AND THUR 210 capsule 1  . ibuprofen (ADVIL,MOTRIN) 200 MG tablet Take 400 mg by mouth as needed.     . Magnesium  500 MG TABS Take 1 tablet by mouth daily.    . methocarbamol (ROBAXIN) 500 MG tablet TAKE 1 TABLET BY MOUTH TWICE A DAY AS NEEDED FOR MUSCLE SPASM 60 tablet 1  . metoprolol tartrate (LOPRESSOR) 25 MG tablet TAKE 1/2 TABLET BY MOUTH TWICE A DAY 30 tablet 3  . Omega-3 Fatty Acids (FISH OIL PO) Take by mouth.    [name] omeprazole (PRILOSEC) 20 MG capsule TAKE 1 CAPSULE BY MOUTH EVERY DAY 90 capsule 1  . polyethylene glycol (MIRALAX / GLYCOLAX) packet Take 17 g by mouth daily.      . potassium chloride SA (KLOR-CON) 20 MEQ tablet Take 1 tablet (20 mEq total) by mouth 2 (two) times daily. 30 tablet 0  . pyridOXINE (VITAMIN B-6) 100 MG tablet Take 100 mg by mouth daily.    . solifenacin (VESICARE) 10 MG tablet Take 1 tablet (10 mg total) by mouth daily. (Patient taking differently: Take by mouth daily. Patient currently taking 5 mg daily) 45 tablet 3   No current facility-administered medications for this visit.    PHYSICAL EXAMINATION: ECOG PERFORMANCE STATUS: 1 - Symptomatic but completely ambulatory  Vitals:   10/10/19 1445  BP: (!) 146/69  Pulse: 69  Resp: 17  Temp: 99.5 F (37.5 C)  SpO2: 97%   Filed Weights   10/10/19 1445  Weight: 126 lb 1.6 oz (57.2 kg)    GENERAL:alert, no distress and comfortable SKIN: skin color, texture, turgor are normal, no rashes or significant lesions EYES: normal, Conjunctiva are pink and non-injected, sclera clear  NECK: supple, thyroid normal size, non-tender, without nodularity LYMPH:  no palpable lymphadenopathy in the cervical, axillary  LUNGS: clear to auscultation and percussion with normal breathing effort HEART: regular rate & rhythm and no murmurs and no lower extremity edema ABDOMEN:abdomen soft, non-tender and normal bowel sounds Musculoskeletal:no cyanosis of digits and no clubbing  NEURO: alert & oriented x 3 with fluent speech, no focal motor/sensory deficits BREAST: S/p b/l lumpectomies: Surgical incision healed well with mild scar tissue  at left incision. (+) lateral right breast tenderness 1:00 position 5cmfn, with 1x1.5cm scar tissue along 7cm well healed incision   LABORATORY DATA:  I have reviewed the data as listed CBC Latest Ref Rng & Units 10/10/2019 08/22/2019 04/21/2019  WBC 4.0 - 10.5 K/uL 3.3(L) 3.4(L) 4.1  Hemoglobin 12.0 - 15.0 g/dL 11.8(L) 12.7 13.5  Hematocrit 36 - 46 % 34.1(L) 35.9(L) 39  Platelets 150 - 400 K/uL 309 358 520(A)     CMP Latest Ref Rng & Units 10/10/2019 08/22/2019 02/21/2019  Glucose 70 - 99 mg/dL 95 105(H) 82  BUN 8 - 23 mg/dL _0 Creatinine 0.44 - 1.00 mg/dL 0.73 0.78 0.74  Sodium 135 - 145 mmol/L 129(L) 130(L) 135  Potassium  3.5 - 5.1 mmol/L 4.0 3.2(L) 3.2(L)  Chloride 98 - 111 mmol/L 95(L) 96(L) 96(L)  CO2 22 - 32 mmol/L _0 Calcium 8.9 - 10.3 mg/dL 9.0 9.2 8.8(L)  Total Protein 6.5 - 8.1 g/dL 6.5 6.5 7.0  Total Bilirubin 0.3 - 1.2 mg/dL 0.6 0.7 0.5  Alkaline Phos 38 - 126 U/L 43 39 46  AST 15 - 41 U/L _1 ALT 0 - 44 U/L 25 19 32      RADIOGRAPHIC STUDIES: I have personally reviewed the radiological images as listed and agreed with the findings in the report. No results found.   ASSESSMENT & PLAN:  Jill Myers is a 86 y.o. female with    1. Bilateral breast cancer, invasive ductal carcinoma, right upper inner quadrant, pT2N0M0, stage IA, ER+/PR+/HER2-, G3, left upper-outer quadrant,pT1cN0M0, stage IA, ER+/PR+/HER2-, G2 -She was diagnosed on 01/20/2017, and is s/pbilateral lumpectomy with negative margins,followed by adjuvant right breastand axillaryradiation. -She tried anastrozole and Exemestane from 01/2017-01/2018 but stopped due to MSK pain. Given her very early stage breast cancer and her age,poor tolerance, will hold adjuvant antiestrogen therapy. -1 week ago she started feeling breast pain with mass. On exam today she has 1x1.5cm scar tissue at lateral end of right breast incision which is the likely cause of pain. This was evaluated by  mammogram and [place] in 2020, and her 02/2019 Mammogram was benign. I do not suspect this is cancer recurrence. Labs reviewed, CBC and CMP WNL except Hg 11.8 -Continue surveillance. Next mammogram in 02/2020 -f/u in 4 months   2.Essential thrombocythemia -OnHydrea dose currently 1000 mg= 2 capsules daily  -She is on ASA to reduce risk of thrombosis -Goal to keep plt<500k. She has been on Hydrea 1064m daily, except 15067mday on Mondays and Thursdays and tolerating well.  -Labs reviewed, Her platelet normal, but has developed mild anemia with Hg 11.8 today (10/10/19). -Will reduce hydrea to 100029maily with extra 500m66m Mondays only. She is agreeable.  -will repeat labs at [facility] in 2 months and return to clinic in 4 months  3.Joint pain,Morton's neuroma of third interspaces of both feet,Restless leg syndrome, sciatica pain  -She continues to takeGabapentin 300mg29might, prn Ibuprofen. -She is able to mostly manage her arthritic pain with moderate exercise at least 3-[facility] a [facility] continue. -After fall in early 2021. I strongly recommend she ambulate with cane and try to keep her phone nearby.  4. Osteopenia  -She had a DEXA on 02/12/2018 and it showed aT-score of -2.2at right hip. Her overall fracture risk of 16%. -To strengthen her bone shestartedZometa infusion q6mont7month14/20 for 2 years. I encouraged her to watch for bone pain. She can take ibuprofen. -I encouraged her to maintain dental health and hygiene. -I encouraged her to continue her calcium and vitDsupplement  5. HTN, GERD -on HCTZ, Omeprazole  -F/u PCP  6. Hypokalemia  -She is on HCTZ which can lower her potassium level. She is now on OTC oral potassium  -K 4.0 today, she does not need rescription oral potassium    PLAN:  -Labs reviewed, Hg 11.8. Reduce Hydrea to 1000mg d36m in the morning with extra 500mg on46mdays afternoon only.  -CBC in 2  months at [facility], will fax the request  -Lab and F/uand Zometa infusionin 4 months  -Diagnostic mammogram in February 2022   No problem-specific Assessment & Plan notes found for this encounter.  Orders Placed This Encounter  Procedures  . MM DIAG BREAST TOMO BILATERAL    Standing Status:   Future    Standing Expiration Date:   10/09/2020    Order Specific Question:   Reason for Exam (SYMPTOM  OR DIAGNOSIS REQUIRED)    Answer:   screening    Order Specific Question:   Preferred imaging location?    Answer:   [facility]   All questions were answered. The patient knows to call the clinic with any problems, questions or concerns. No barriers to learning was detected. The total time spent in the appointment was 30 minutes.     Truitt Merle, MD 10/10/2019   I, Joslyn Devon, am acting as scribe for Truitt Merle, MD.   I have reviewed the above documentation for accuracy and completeness, and I agree with the above.

## 2019-10-08 ENCOUNTER — Other Ambulatory Visit: Payer: Self-pay | Admitting: Nurse Practitioner

## 2019-10-08 DIAGNOSIS — I10 Essential (primary) hypertension: Secondary | ICD-10-CM

## 2019-10-08 DIAGNOSIS — G2581 Restless legs syndrome: Secondary | ICD-10-CM

## 2019-10-10 ENCOUNTER — Encounter: Payer: Self-pay | Admitting: Hematology

## 2019-10-10 ENCOUNTER — Other Ambulatory Visit: Payer: Self-pay

## 2019-10-10 ENCOUNTER — Inpatient Hospital Stay: Payer: Medicare PPO | Attending: Hematology

## 2019-10-10 ENCOUNTER — Inpatient Hospital Stay (HOSPITAL_BASED_OUTPATIENT_CLINIC_OR_DEPARTMENT_OTHER): Payer: Medicare PPO | Admitting: Hematology

## 2019-10-10 VITALS — BP 146/69 | HR 69 | Temp 99.5°F | Resp 17 | Ht 63.0 in | Wt 126.1 lb

## 2019-10-10 DIAGNOSIS — C50412 Malignant neoplasm of upper-outer quadrant of left female breast: Secondary | ICD-10-CM | POA: Insufficient documentation

## 2019-10-10 DIAGNOSIS — G2581 Restless legs syndrome: Secondary | ICD-10-CM | POA: Insufficient documentation

## 2019-10-10 DIAGNOSIS — M2550 Pain in unspecified joint: Secondary | ICD-10-CM | POA: Insufficient documentation

## 2019-10-10 DIAGNOSIS — Z79899 Other long term (current) drug therapy: Secondary | ICD-10-CM | POA: Insufficient documentation

## 2019-10-10 DIAGNOSIS — Z510 Encounter for antineoplastic radiation therapy: Secondary | ICD-10-CM | POA: Insufficient documentation

## 2019-10-10 DIAGNOSIS — Z170 Estrogen receptor positive status [ER+]: Secondary | ICD-10-CM | POA: Insufficient documentation

## 2019-10-10 DIAGNOSIS — Z79811 Long term (current) use of aromatase inhibitors: Secondary | ICD-10-CM | POA: Diagnosis not present

## 2019-10-10 DIAGNOSIS — K219 Gastro-esophageal reflux disease without esophagitis: Secondary | ICD-10-CM | POA: Diagnosis not present

## 2019-10-10 DIAGNOSIS — I10 Essential (primary) hypertension: Secondary | ICD-10-CM | POA: Diagnosis not present

## 2019-10-10 DIAGNOSIS — G5760 Lesion of plantar nerve, unspecified lower limb: Secondary | ICD-10-CM | POA: Diagnosis not present

## 2019-10-10 DIAGNOSIS — D75839 Thrombocytosis, unspecified: Secondary | ICD-10-CM | POA: Insufficient documentation

## 2019-10-10 DIAGNOSIS — C50211 Malignant neoplasm of upper-inner quadrant of right female breast: Secondary | ICD-10-CM | POA: Diagnosis not present

## 2019-10-10 DIAGNOSIS — E876 Hypokalemia: Secondary | ICD-10-CM | POA: Diagnosis not present

## 2019-10-10 DIAGNOSIS — M8580 Other specified disorders of bone density and structure, unspecified site: Secondary | ICD-10-CM | POA: Diagnosis not present

## 2019-10-10 DIAGNOSIS — Z791 Long term (current) use of non-steroidal anti-inflammatories (NSAID): Secondary | ICD-10-CM | POA: Diagnosis not present

## 2019-10-10 LAB — CBC WITH DIFFERENTIAL (CANCER CENTER ONLY)
Abs Immature Granulocytes: 0 10*3/uL (ref 0.00–0.07)
Basophils Absolute: 0.1 10*3/uL (ref 0.0–0.1)
Basophils Relative: 2 %
Eosinophils Absolute: 0.1 10*3/uL (ref 0.0–0.5)
Eosinophils Relative: 4 %
HCT: 34.1 % — ABNORMAL LOW (ref 36.0–46.0)
Hemoglobin: 11.8 g/dL — ABNORMAL LOW (ref 12.0–15.0)
Lymphocytes Relative: 17 %
Lymphs Abs: 0.6 10*3/uL — ABNORMAL LOW (ref 0.7–4.0)
MCH: 41.8 pg — ABNORMAL HIGH (ref 26.0–34.0)
MCHC: 34.6 g/dL (ref 30.0–36.0)
MCV: 120.9 fL — ABNORMAL HIGH (ref 80.0–100.0)
Monocytes Absolute: 0.3 10*3/uL (ref 0.1–1.0)
Monocytes Relative: 10 %
Neutro Abs: 2.2 10*3/uL (ref 1.7–7.7)
Neutrophils Relative %: 67 %
Platelet Count: 309 10*3/uL (ref 150–400)
RBC: 2.82 MIL/uL — ABNORMAL LOW (ref 3.87–5.11)
RDW: 13.6 % (ref 11.5–15.5)
WBC Count: 3.3 10*3/uL — ABNORMAL LOW (ref 4.0–10.5)
nRBC: 0 % (ref 0.0–0.2)

## 2019-10-10 LAB — CMP (CANCER CENTER ONLY)
ALT: 25 U/L (ref 0–44)
AST: 25 U/L (ref 15–41)
Albumin: 3.9 g/dL (ref 3.5–5.0)
Alkaline Phosphatase: 43 U/L (ref 38–126)
Anion gap: 5 (ref 5–15)
BUN: 15 mg/dL (ref 8–23)
CO2: 29 mmol/L (ref 22–32)
Calcium: 9 mg/dL (ref 8.9–10.3)
Chloride: 95 mmol/L — ABNORMAL LOW (ref 98–111)
Creatinine: 0.73 mg/dL (ref 0.44–1.00)
GFR, Est AFR Am: >60 mL/min (ref >60)
GFR, Estimated: >60 mL/min (ref >60)
Glucose, Bld: 95 mg/dL (ref 70–99)
Potassium: 4 mmol/L (ref 3.5–5.1)
Sodium: 129 mmol/L — ABNORMAL LOW (ref 135–145)
Total Bilirubin: 0.6 mg/dL (ref 0.3–1.2)
Total Protein: 6.5 g/dL (ref 6.5–8.1)

## 2019-10-12 ENCOUNTER — Telehealth: Payer: Self-pay | Admitting: Hematology

## 2019-10-12 NOTE — Telephone Encounter (Signed)
No 10/4 los °

## 2019-11-01 DIAGNOSIS — D696 Thrombocytopenia, unspecified: Secondary | ICD-10-CM | POA: Diagnosis not present

## 2019-11-02 ENCOUNTER — Telehealth: Payer: Self-pay

## 2019-11-02 NOTE — Telephone Encounter (Signed)
Labs received from [facility] done on 11/01/2019 reviewed by Dr Burr Medico.  WBC 2.8, Hgb 11.5 PLT 275K.  Per Dr Burr Medico patient to stop hydrea 500mg  on Monday pm.  I reviewed this with Jill Myers and she verbalized understanding.n

## 2019-11-22 ENCOUNTER — Inpatient Hospital Stay: Payer: Medicare PPO | Attending: Hematology

## 2019-11-22 ENCOUNTER — Other Ambulatory Visit: Payer: Self-pay

## 2019-11-22 DIAGNOSIS — Z170 Estrogen receptor positive status [ER+]: Secondary | ICD-10-CM | POA: Insufficient documentation

## 2019-11-22 DIAGNOSIS — C50412 Malignant neoplasm of upper-outer quadrant of left female breast: Secondary | ICD-10-CM | POA: Insufficient documentation

## 2019-11-22 DIAGNOSIS — C50211 Malignant neoplasm of upper-inner quadrant of right female breast: Secondary | ICD-10-CM | POA: Insufficient documentation

## 2019-11-22 LAB — CBC WITH DIFFERENTIAL (CANCER CENTER ONLY)
Abs Immature Granulocytes: 0 10*3/uL (ref 0.00–0.07)
Basophils Absolute: 0 10*3/uL (ref 0.0–0.1)
Basophils Relative: 1 %
Eosinophils Absolute: 0 10*3/uL (ref 0.0–0.5)
Eosinophils Relative: 1 %
HCT: 35.2 % — ABNORMAL LOW (ref 36.0–46.0)
Hemoglobin: 12 g/dL (ref 12.0–15.0)
Lymphocytes Relative: 18 %
Lymphs Abs: 0.7 10*3/uL (ref 0.7–4.0)
MCH: 40.8 pg — ABNORMAL HIGH (ref 26.0–34.0)
MCHC: 34.1 g/dL (ref 30.0–36.0)
MCV: 119.7 fL — ABNORMAL HIGH (ref 80.0–100.0)
Monocytes Absolute: 0.4 10*3/uL (ref 0.1–1.0)
Monocytes Relative: 10 %
Neutro Abs: 2.9 10*3/uL (ref 1.7–7.7)
Neutrophils Relative %: 70 %
Platelet Count: 371 10*3/uL (ref 150–400)
RBC: 2.94 MIL/uL — ABNORMAL LOW (ref 3.87–5.11)
RDW: 13.9 % (ref 11.5–15.5)
WBC Count: 4.1 10*3/uL (ref 4.0–10.5)
nRBC: 0 % (ref 0.0–0.2)

## 2019-11-25 ENCOUNTER — Other Ambulatory Visit: Payer: Self-pay

## 2019-11-25 ENCOUNTER — Encounter: Payer: Self-pay | Admitting: Internal Medicine

## 2019-11-25 ENCOUNTER — Non-Acute Institutional Stay: Payer: Medicare PPO | Admitting: Internal Medicine

## 2019-11-25 VITALS — BP 142/74 | HR 62 | Temp 96.8°F | Ht 63.0 in | Wt 129.2 lb

## 2019-11-25 DIAGNOSIS — I10 Essential (primary) hypertension: Secondary | ICD-10-CM | POA: Diagnosis not present

## 2019-11-25 DIAGNOSIS — D473 Essential (hemorrhagic) thrombocythemia: Secondary | ICD-10-CM | POA: Diagnosis not present

## 2019-11-25 DIAGNOSIS — E871 Hypo-osmolality and hyponatremia: Secondary | ICD-10-CM | POA: Diagnosis not present

## 2019-11-25 DIAGNOSIS — R682 Dry mouth, unspecified: Secondary | ICD-10-CM | POA: Diagnosis not present

## 2019-11-25 DIAGNOSIS — G2581 Restless legs syndrome: Secondary | ICD-10-CM

## 2019-11-25 DIAGNOSIS — M159 Polyosteoarthritis, unspecified: Secondary | ICD-10-CM

## 2019-11-25 DIAGNOSIS — M8949 Other hypertrophic osteoarthropathy, multiple sites: Secondary | ICD-10-CM

## 2019-11-25 DIAGNOSIS — N3941 Urge incontinence: Secondary | ICD-10-CM | POA: Diagnosis not present

## 2019-11-25 DIAGNOSIS — K219 Gastro-esophageal reflux disease without esophagitis: Secondary | ICD-10-CM | POA: Diagnosis not present

## 2019-11-25 MED ORDER — TETANUS-DIPHTH-ACELL PERTUSSIS 5-2.5-18.5 LF-MCG/0.5 IM SUSP: 0.5000 mL | Freq: Once | INTRAMUSCULAR | 0 refills | Status: AC

## 2019-11-27 NOTE — Progress Notes (Signed)
Location:  [place] of Service:  Clinic (12)  Provider:   Code Status:  Goals of Care:  Advanced Directives 08/22/2019  Does Patient Have a Medical Advance Directive? Yes  Type of Advance Directive -  Does patient want to make changes to medical advance directive? -  Copy of Ferdinand in Chart? -  Would patient like information on creating a medical advance directive? -     Chief Complaint  Patient presents with  . Medical Management of Chronic Issues    Patient returns to the clinic for follow up. She would like to discuss Cough, dry muth, hip/spine.  [name] Health Maintenance    TDAP    HPI: Patient is a 86 y.o. female seen today for medical management of chronic diseases.    Patient has a history of bilateral breast cancer s/p lumpectomy followed by lymph radiation.  Follows with Dr. Annamaria Boots, essential thrombocythemia on Hydrea, restless leg syndrome, hypertension, osteopenia and GERD  Dry mouth continues to be a problem.  She says it started on Vesicare History of bilateral cancer s/p lumpectomy followed by radiation Intolerability to adjuvant  therapy Follows with Dr. Pilar Plate Essential thrombocythemia Hydrea dose recently was changed Right hip pain Doing better now states therapy helped Hyponatremia Osteopenia on Zometa infusion  No other issues today Lives [place] Very walks without any cane or walker.  No recent falls.  Independent with ADLs and IADLs and still drives  Past Medical History:  Diagnosis Date  . Bladder cystocele 05/21/2010  . Complication of anesthesia    deglutition in history  . Deglutition disorder 08/17/2012   Single episode of choking/aspiration; preceded by trouble swallowing saliva (thin liquids).  For SLP evaluation. Aug 17, 2012.    [name] Disease of blood and blood forming organ 01/05/2007   Qualifier: Diagnosis of  By: Oneida Alar MD, KARL    . Diverticulitis   . DIVERTICULOSIS OF COLON 03/05/2006    Qualifier: Diagnosis of  By: Samara Snide    . DIZZINESS 01/18/2010   Qualifier: Diagnosis of  By: Lindell Noe MD, Jeneen Rinks    . Essential hypertension, benign 02/22/2008   Qualifier: Diagnosis of  By: Lindell Noe MD, Jeneen Rinks     . Essential thrombocythemia ([place]) 01/31/11  . Family history of Alzheimer's disease 11/11/2013   Patient with family history of Alzheimers disease. She herself is very highly functional.  Plans for future visit with MMSE as primary function of the visit.    [name] GERD (gastroesophageal reflux disease) 08/16/2010  . HALLUX VALGUS, ACQUIRED 04/10/2009   Qualifier: Diagnosis of  By: [name] CMA,, [place]    . Hypertension   . HYPERTRIGLYCERIDEMIA 01/05/2007   Qualifier: Diagnosis of  By: Oneida Alar MD, KARL    . Insomnia 02/10/2014  . Left-sided chest wall pain 12/19/2011    L sided chest wall pain follows a dermatomal distribution and raises the possibility of thoracic radiculopathic pain, which would be expected to improve with gabapentin as well. We discussed this at length and she will call to make me aware if this does not get better. Rib belt/girdle in the meantime; may consider rib films or CXR if not improving.     . Metatarsalgia of right foot 11/28/2013   Referral to [facility]   . Osteopenia   . Personal history of radiation therapy    03/2017 right breast  . Restless leg syndrome 06/09/2011  . Restless legs syndrome (RLS)   . URINARY INCONTINENCE,  URGE, MILD 11/27/2009   Qualifier: Diagnosis of  By: Zebedee Iba NP, Manuela Schwartz    . Vitamin D deficiency 08/16/2010    Past Surgical History:  Procedure Laterality Date  . BIOPSY BREAST Left 1998   Dr. Rebekah Chesterfield  . BREAST EXCISIONAL BIOPSY    . BREAST LUMPECTOMY Right 02/20/2017   Procedure: RIGHT BREAST LUMPECTOMY;  Surgeon: Jovita Kussmaul, MD;  Location: [place];  Service: General;  Laterality: Right;  . BREAST LUMPECTOMY Left 02/20/2017   left lumpectomy  . BREAST LUMPECTOMY WITH RADIOACTIVE SEED LOCALIZATION Left 02/20/2017   Procedure:  LEFT BREAST LUMPECTOMY WITH RADIOACTIVE SEED LOCALIZATION;  Surgeon: Jovita Kussmaul, MD;  Location: [facility];  Service: General;  Laterality: Left;  . CATARACT EXTRACTION  2010   Dr. [name]  . CHOLECYSTECTOMY  1992  . COLONOSCOPY  2008  . MOHS SURGERY  2012   on face [facility] [place]  . NAILBED REPAIR  12/05/2010   Procedure: NAILBED REPAIR;  Surgeon: Cammie Sickle., MD;  Location: [place];  Service: Orthopedics;  Laterality: Right;  full thickness nail biopsy right hallux  . torn tenden  2012   hand Dr. Orie Rout    Allergies  Allergen Reactions  . Doxycycline Nausea And Vomiting  . Macrodantin [Nitrofurantoin] Nausea And Vomiting  . Sulfamethoxazole-Trimethoprim Rash    REACTION: Rash after completing course of Septra    Outpatient Encounter Medications as of 11/25/2019  Medication Sig  . aspirin 81 MG tablet Take 81 mg by mouth daily.    . calcium carbonate (OSCAL) 1500 (600 Ca) MG TABS tablet Take 600 mg of elemental calcium by mouth in the morning and at bedtime.   . cholecalciferol (VITAMIN D) 1000 UNITS tablet Take 2,000 Units by mouth daily.   [name] gabapentin (NEURONTIN) 300 MG capsule TAKE 1 CAPSULE BY MOUTH EVERY MORNING AND TAKE 3 CAPSULES BY MOUTH EVERY EVENING  . GLUCOSAMINE-CHONDROITIN PO Take by mouth. 1500-1200  Take two tablet daily  . hydrochlorothiazide (HYDRODIURIL) 25 MG tablet TAKE 1 TABLET BY MOUTH EVERY DAY  . hydroxyurea (HYDREA) 500 MG capsule TAKE 2 CAPS BY MOUTH DAILY WITH FOOD TO MINIMIZE GI SIDE EFFECTS, MAY TAKE EXTRA CAP MON AND THUR  . ibuprofen (ADVIL,MOTRIN) 200 MG tablet Take 400 mg by mouth as needed.   . Magnesium 500 MG TABS Take 1 tablet by mouth daily.  . methocarbamol (ROBAXIN) 500 MG tablet TAKE 1 TABLET BY MOUTH TWICE A DAY AS NEEDED FOR MUSCLE SPASM  . metoprolol tartrate (LOPRESSOR) 25 MG tablet TAKE 1/2 TABLET BY MOUTH TWICE A DAY  . Omega-3 Fatty Acids (FISH OIL PO) Take by mouth.  [name]  omeprazole (PRILOSEC) 20 MG capsule TAKE 1 CAPSULE BY MOUTH EVERY DAY  . polyethylene glycol (MIRALAX / GLYCOLAX) packet Take 17 g by mouth daily.    . potassium chloride SA (KLOR-CON) 20 MEQ tablet Take 20 mEq by mouth daily.  [name] pyridOXINE (VITAMIN B-6) 100 MG tablet Take 100 mg by mouth daily.  . solifenacin (VESICARE) 10 MG tablet Take 1 tablet (10 mg total) by mouth daily. (Patient taking differently: Take by mouth daily. Patient currently taking 5 mg daily)  . [EXPIRED] Tdap (BOOSTRIX) 5-2.5-18.5 LF-MCG/0.5 injection Inject 0.5 mLs into the muscle once for 1 dose.  . [DISCONTINUED] Tdap (BOOSTRIX) 5-2.5-18.5 LF-MCG/0.5 injection Inject 0.5 mLs into the muscle once.  . [DISCONTINUED] potassium chloride SA (KLOR-CON) 20 MEQ tablet Take 1 tablet (20 mEq total) by mouth 2 (  two) times daily. (Patient taking differently: Take 20 mEq by mouth daily. )   No facility-administered encounter medications on file as of 11/25/2019.    Review of Systems:  Review of Systems  Review of Systems  Constitutional: Negative for activity change, appetite change, chills, diaphoresis, fatigue and fever.  HENT: Negative for mouth sores, postnasal drip, rhinorrhea, sinus pain and sore throat.   Respiratory: Negative for apnea, cough, chest tightness, shortness of breath and wheezing.   Cardiovascular: Negative for chest pain, palpitations and leg swelling.  Gastrointestinal: Negative for abdominal distention, abdominal pain, constipation, diarrhea, nausea and vomiting.  Genitourinary: Negative for dysuria and frequency.  Musculoskeletal: Negative for arthralgias, joint swelling and myalgias.  Skin: Negative for rash.  Neurological: Negative for dizziness, syncope, weakness, light-headedness and numbness.  Psychiatric/Behavioral: Negative for behavioral problems, confusion and sleep disturbance.     Health Maintenance  Topic Date Due  . TETANUS/TDAP  04/11/2019  . INFLUENZA VACCINE  Completed  . DEXA SCAN   Completed  . COVID-19 Vaccine  Completed  . PNA vac Low Risk Adult  Completed    Physical Exam: Vitals:   11/25/19 1105  BP: (!) 142/74  Pulse: 62  Temp: (!) 96.8 F (36 C)  SpO2: 100%  Weight: 129 lb 3.2 oz (58.6 kg)  Height: 5\' 3"  (1.6 m)   Body mass index is 22.89 kg/m. Physical Exam  Constitutional: Oriented to person, place, and time. Well-developed and well-nourished.  HENT:  Head: Normocephalic.  Mouth/Throat: Oropharynx is clear and moist.  Eyes: Pupils are equal, round, and reactive to light.  Neck: Neck supple.  Cardiovascular: Normal rate and normal heart sounds.  No murmur heard. Pulmonary/Chest: Effort normal and breath sounds normal. No respiratory distress. No wheezes. She has no rales.  Abdominal: Soft. Bowel sounds are normal. No distension. There is no tenderness. There is no rebound.  Musculoskeletal: No edema.  Lymphadenopathy: none Neurological: Alert and oriented to person, place, and time. Gait was stable. Walks withwith no Cane or walker Skin: Skin is warm and dry.  Psychiatric: Normal mood and affect. Behavior is normal. Thought content normal.    Labs reviewed: Basic Metabolic Panel: Recent Labs    02/15/19 0705 02/15/19 0705 02/21/19 0949 08/22/19 1016 10/10/19 1434  NA 134*   < > 135 130* 129*  K 4.2   < > 3.2* 3.2* 4.0  CL 97*   < > 96* 96* 95*  CO2 29   < > 29 24 29   GLUCOSE 90   < > 82 105* 95  BUN 14   < > 10 12 15   CREATININE 0.74  --  0.74 0.78 0.73  CALCIUM 9.2   < > 8.8* 9.2 9.0  TSH 4.04  --   --   --   --    < > = values in this interval not displayed.   Liver Function Tests: Recent Labs    02/21/19 0949 08/22/19 1016 10/10/19 1434  AST 24 18 25   ALT 32 19 25  ALKPHOS 46 39 43  BILITOT 0.5 0.7 0.6  PROT 7.0 6.5 6.5  ALBUMIN 4.2 3.9 3.9   No results for input(s): LIPASE, AMYLASE in the last 8760 hours. No results for input(s): AMMONIA in the last 8760 hours. CBC: Recent Labs    08/22/19 1016 10/10/19 1434  11/22/19 1034  WBC 3.4* 3.3* 4.1  NEUTROABS 2.4 2.2 2.9  HGB 12.7 11.8* 12.0  HCT 35.9* 34.1* 35.2*  MCV 116.6* 120.9* 119.7*  PLT  358 309 371   Lipid Panel: Recent Labs    02/15/19 0705  CHOL 163  HDL 50  LDLCALC 94  TRIG 94  CHOLHDL 3.3   No results found for: HGBA1C  Procedures since last visit: No results found.  Assessment/Plan Dry mouth Will try Biotene Follows with Dentist Hyponatremia Decrease the free water intake. Increase the salt HCTZ can also cause low sodium Repeat BMP URINARY INCONTINENCE, URGE, MILD On Vesicare Gastroesophageal reflux disease without esophagitis On Prilosec Essential hypertension On HCTZ Essential thrombocythemia (HCC) Hydrea Restless leg syndrome Neurontin Primary osteoarthritis involving multiple joints Did well with therapy  Bilateral Breast cancer S/P Lumpectomy and Radiation no on Adjuvant Follows with Oncology Osteopenia On Zumeta per Oncology    Labs/tests ordered:  * No order type specified * Next appt:  04/05/2020

## 2019-12-07 ENCOUNTER — Encounter: Payer: Self-pay | Admitting: Hematology

## 2019-12-07 DIAGNOSIS — L7211 Pilar cyst: Secondary | ICD-10-CM | POA: Diagnosis not present

## 2019-12-07 DIAGNOSIS — D485 Neoplasm of uncertain behavior of skin: Secondary | ICD-10-CM | POA: Diagnosis not present

## 2019-12-07 DIAGNOSIS — L570 Actinic keratosis: Secondary | ICD-10-CM | POA: Diagnosis not present

## 2019-12-07 DIAGNOSIS — Z85828 Personal history of other malignant neoplasm of skin: Secondary | ICD-10-CM | POA: Diagnosis not present

## 2019-12-07 DIAGNOSIS — D044 Carcinoma in situ of skin of scalp and neck: Secondary | ICD-10-CM | POA: Diagnosis not present

## 2019-12-29 DIAGNOSIS — D696 Thrombocytopenia, unspecified: Secondary | ICD-10-CM | POA: Diagnosis not present

## 2020-01-04 ENCOUNTER — Other Ambulatory Visit: Payer: Self-pay | Admitting: Nurse Practitioner

## 2020-01-04 DIAGNOSIS — K219 Gastro-esophageal reflux disease without esophagitis: Secondary | ICD-10-CM

## 2020-01-17 ENCOUNTER — Other Ambulatory Visit: Payer: Self-pay | Admitting: Nurse Practitioner

## 2020-01-17 DIAGNOSIS — I10 Essential (primary) hypertension: Secondary | ICD-10-CM

## 2020-01-23 ENCOUNTER — Telehealth: Payer: Self-pay | Admitting: *Deleted

## 2020-01-23 NOTE — Telephone Encounter (Signed)
Notified that CBC is good and continue taking current dose of hydrea. Verbalized understanding

## 2020-01-24 ENCOUNTER — Other Ambulatory Visit: Payer: Self-pay | Admitting: Hematology

## 2020-01-24 DIAGNOSIS — D473 Essential (hemorrhagic) thrombocythemia: Secondary | ICD-10-CM

## 2020-01-30 ENCOUNTER — Telehealth: Payer: Self-pay | Admitting: Nurse Practitioner

## 2020-01-30 NOTE — Telephone Encounter (Signed)
Called to schedule an AWV. Patient stated she does not want to do these and does not want to be called about this again.

## 2020-02-02 ENCOUNTER — Encounter: Payer: Self-pay | Admitting: Nurse Practitioner

## 2020-02-02 ENCOUNTER — Other Ambulatory Visit: Payer: Self-pay

## 2020-02-02 ENCOUNTER — Non-Acute Institutional Stay: Payer: Medicare PPO | Admitting: Nurse Practitioner

## 2020-02-02 DIAGNOSIS — E871 Hypo-osmolality and hyponatremia: Secondary | ICD-10-CM | POA: Diagnosis not present

## 2020-02-02 DIAGNOSIS — C50211 Malignant neoplasm of upper-inner quadrant of right female breast: Secondary | ICD-10-CM

## 2020-02-02 DIAGNOSIS — N3941 Urge incontinence: Secondary | ICD-10-CM

## 2020-02-02 DIAGNOSIS — D473 Essential (hemorrhagic) thrombocythemia: Secondary | ICD-10-CM

## 2020-02-02 DIAGNOSIS — I10 Essential (primary) hypertension: Secondary | ICD-10-CM | POA: Diagnosis not present

## 2020-02-02 DIAGNOSIS — M159 Polyosteoarthritis, unspecified: Secondary | ICD-10-CM

## 2020-02-02 DIAGNOSIS — M8949 Other hypertrophic osteoarthropathy, multiple sites: Secondary | ICD-10-CM | POA: Diagnosis not present

## 2020-02-02 DIAGNOSIS — M25461 Effusion, right knee: Secondary | ICD-10-CM

## 2020-02-02 DIAGNOSIS — K5909 Other constipation: Secondary | ICD-10-CM | POA: Diagnosis not present

## 2020-02-02 DIAGNOSIS — G5763 Lesion of plantar nerve, bilateral lower limbs: Secondary | ICD-10-CM

## 2020-02-02 DIAGNOSIS — M85851 Other specified disorders of bone density and structure, right thigh: Secondary | ICD-10-CM

## 2020-02-02 DIAGNOSIS — K219 Gastro-esophageal reflux disease without esophagitis: Secondary | ICD-10-CM | POA: Diagnosis not present

## 2020-02-02 DIAGNOSIS — Z170 Estrogen receptor positive status [ER+]: Secondary | ICD-10-CM

## 2020-02-02 DIAGNOSIS — G2581 Restless legs syndrome: Secondary | ICD-10-CM

## 2020-02-02 NOTE — Assessment & Plan Note (Addendum)
Osteopenia, takes Zometa.

## 2020-02-02 NOTE — Assessment & Plan Note (Addendum)
The R+L ball part feet pain when walking, not new, Hx of Metatarsalgia, Morton's neuroma 

## 2020-02-02 NOTE — Assessment & Plan Note (Signed)
HTN, Metoprolol 12.5mg  bid since 05/24/19, HCTZ 25mg  qd

## 2020-02-02 NOTE — Assessment & Plan Note (Signed)
Bilateral breat cancer, s/p lumpectomy, radiation, on Adjuvant, f/u oncology.

## 2020-02-02 NOTE — Assessment & Plan Note (Signed)
Hx of RLS, stable, on Gabapentin 300mg qam 900mg qpm.  

## 2020-02-02 NOTE — Assessment & Plan Note (Signed)
the  right knee pain about 2-3 months, worsening, able to bear weight and walking as usual. It was noted knee prepatellar bursitis/effusion, pain when palpated. No redness or warmth. F/u Ortho.

## 2020-02-02 NOTE — Progress Notes (Signed)
Location:   clinic [place]   Place of Service:  Clinic (12) Provider: Marlana Latus NP  Code Status: DNR Goals of Care: [place] 08/22/2019  Does Patient Have a Medical Advance Directive? Yes  Type of Advance Directive -  Does patient want to make changes to medical advance directive? -  Copy of [place] in Chart? -  Would patient like information on creating a medical advance directive? -     Chief Complaint  Patient presents with  . Medical Management of Chronic Issues    Patient returns to the clinic for follow up.   [name] Health Maintenance    TDAP     HPI: Patient is a 87 y.o. female seen today for the  right knee pain about 2-3 months, worsening, able to bear weight and walking as usual. It was noted knee prepatellar bursitis/effusion, pain when palpated. No redness or warmth.   The R+L ball part feet pain when walking, not new, Hx of Metatarsalgia, Morton's neuroma  Bilateral breast cancer, s/p lumpectomy, radiation, on Adjuvant, f/u oncology.              HTN, Metoprolol 12.5mg  bid since 05/24/19, HCTZ 25mg  qd Hx of RLS, stable, on Gabapentin 300mg  qam 900mg  qpm.             Essential thrombocythemia stable, on Hydrea, plt371 11/22/19             GERD, stable, on Omeprazole.           Constipation, stable, on MiraLax qd, Colace qd.              Urinary frequency, stable, on Vesicare 10mg  qd.  Lower back pain, radiculopathy R, Ortho 04/28/19, Ibuprofen II prn working with therapy.             Hyponatremia, Na 129 10/10/19  Osteopenia, takes Zometa.     Past Medical History:  Diagnosis Date  . Bladder cystocele 05/21/2010  . Complication of anesthesia    deglutition in history  . Deglutition disorder 08/17/2012   Single episode of choking/aspiration; preceded by trouble swallowing saliva (thin liquids).  For SLP evaluation. Aug 17, 2012.    [name] Disease of blood and blood forming organ 01/05/2007   Qualifier:  Diagnosis of  By: Oneida Alar MD, KARL    . Diverticulitis   . DIVERTICULOSIS OF COLON 03/05/2006   Qualifier: Diagnosis of  By: Samara Snide    . DIZZINESS 01/18/2010   Qualifier: Diagnosis of  By: Lindell Noe MD, Jeneen Rinks    . Essential hypertension, benign 02/22/2008   Qualifier: Diagnosis of  By: Lindell Noe MD, Jeneen Rinks     . Essential thrombocythemia ([place]) 01/31/11  . Family history of Alzheimer's disease 11/11/2013   Patient with family history of Alzheimers disease. She herself is very highly functional.  Plans for future visit with MMSE as primary function of the visit.    [name] GERD (gastroesophageal reflux disease) 08/16/2010  . HALLUX VALGUS, ACQUIRED 04/10/2009   Qualifier: Diagnosis of  By: [name] CMA,, Thekla    . Hypertension   . HYPERTRIGLYCERIDEMIA 01/05/2007   Qualifier: Diagnosis of  By: Oneida Alar MD, KARL    . Insomnia 02/10/2014  . Left-sided chest wall pain 12/19/2011    L sided chest wall pain follows a dermatomal distribution and raises the possibility of thoracic radiculopathic pain, which would be expected to improve with gabapentin as well. We discussed this at length and she will call to make me aware  if this does not get better. Rib belt/girdle in the meantime; may consider rib films or CXR if not improving.     . Metatarsalgia of right foot 11/28/2013   Referral to [facility]   . Osteopenia   . Personal history of radiation therapy    03/2017 right breast  . Restless leg syndrome 06/09/2011  . Restless legs syndrome (RLS)   . URINARY INCONTINENCE, URGE, MILD 11/27/2009   Qualifier: Diagnosis of  By: Zebedee Iba NP, Manuela Schwartz    . Vitamin D deficiency 08/16/2010    Past Surgical History:  Procedure Laterality Date  . BIOPSY BREAST Left 1998   Dr. Rebekah Chesterfield  . BREAST EXCISIONAL BIOPSY    . BREAST LUMPECTOMY Right 02/20/2017   Procedure: RIGHT BREAST LUMPECTOMY;  Surgeon: Jovita Kussmaul, MD;  Location: [place];  Service: General;  Laterality: Right;  . BREAST LUMPECTOMY Left 02/20/2017   left  lumpectomy  . BREAST LUMPECTOMY WITH RADIOACTIVE SEED LOCALIZATION Left 02/20/2017   Procedure: LEFT BREAST LUMPECTOMY WITH RADIOACTIVE SEED LOCALIZATION;  Surgeon: Jovita Kussmaul, MD;  Location: [place];  Service: General;  Laterality: Left;  . CATARACT EXTRACTION  2010   Dr. [name]  . CHOLECYSTECTOMY  1992  . COLONOSCOPY  2008  . MOHS SURGERY  2012   on face [facility] [place]  . NAILBED REPAIR  12/05/2010   Procedure: NAILBED REPAIR;  Surgeon: Cammie Sickle., MD;  Location: [place];  Service: Orthopedics;  Laterality: Right;  full thickness nail biopsy right hallux  . torn tenden  2012   hand Dr. Orie Rout    Allergies  Allergen Reactions  . Doxycycline Nausea And Vomiting  . Macrodantin [Nitrofurantoin] Nausea And Vomiting  . Sulfamethoxazole-Trimethoprim Rash    REACTION: Rash after completing course of Septra    Allergies as of 02/02/2020      Reactions   Doxycycline Nausea And Vomiting   Macrodantin [nitrofurantoin] Nausea And Vomiting   Sulfamethoxazole-trimethoprim Rash   REACTION: Rash after completing course of Septra      Medication List       Accurate as of February 02, 2020  4:04 PM. If you have any questions, ask your nurse or doctor.        STOP taking these medications   methocarbamol 500 MG tablet Commonly known as: ROBAXIN Stopped by: Franci Oshana X Jahanna Raether, NP     TAKE these medications   aspirin 81 MG tablet Take 81 mg by mouth daily.   calcium carbonate 1500 (600 Ca) MG Tabs tablet Commonly known as: OSCAL Take 600 mg of elemental calcium by mouth in the morning and at bedtime.   cholecalciferol 1000 units tablet Commonly known as: VITAMIN D Take 2,000 Units by mouth daily.   FISH OIL PO Take by mouth.   gabapentin 300 MG capsule Commonly known as: NEURONTIN TAKE 1 CAPSULE BY MOUTH EVERY MORNING AND TAKE 3 CAPSULES BY MOUTH EVERY EVENING   GLUCOSAMINE-CHONDROITIN PO Take by mouth. 1500-1200   Take two tablet daily   hydrochlorothiazide 25 MG tablet Commonly known as: HYDRODIURIL TAKE 1 TABLET BY MOUTH EVERY DAY   hydroxyurea 500 MG capsule Commonly known as: HYDREA TAKE 2 CAPS BY MOUTH DAILY WITH FOOD TO MINIMIZE GI SIDE EFFECTS, MAY TAKE EXTRA CAP MON AND THUR   ibuprofen 200 MG tablet Commonly known as: ADVIL Take 400 mg by mouth as needed.   Magnesium 500 MG Tabs Take 1 tablet by mouth daily.   metoprolol  tartrate 25 MG tablet Commonly known as: LOPRESSOR TAKE 1/2 TABLET BY MOUTH TWICE A DAY   omeprazole 20 MG capsule Commonly known as: PRILOSEC TAKE 1 CAPSULE BY MOUTH EVERY DAY   polyethylene glycol 17 g packet Commonly known as: MIRALAX / GLYCOLAX Take 17 g by mouth daily.   potassium chloride SA 20 MEQ tablet Commonly known as: KLOR-CON Take 20 mEq by mouth daily.   pyridOXINE 100 MG tablet Commonly known as: VITAMIN B-6 Take 100 mg by mouth daily.   solifenacin 5 MG tablet Commonly known as: VESICARE Take 5 mg by mouth daily. What changed: Another medication with the same name was removed. Continue taking this medication, and follow the directions you see here. Changed by: Dejanique Ruehl X Iyanni Hepp, NP       Review of Systems:  Review of Systems  Constitutional: Negative for appetite change, fatigue and fever.  HENT: Positive for hearing loss and postnasal drip. Negative for congestion and voice change.   Eyes: Negative for visual disturbance.  Respiratory: Positive for cough. Negative for shortness of breath.        Chronic cough  Cardiovascular: Positive for leg swelling. Negative for chest pain and palpitations.  Gastrointestinal: Negative for abdominal pain and constipation.  Genitourinary: Positive for frequency. Negative for dysuria and urgency.  Musculoskeletal: Positive for arthralgias, back pain, gait problem and joint swelling.       R+L foot MTJ pain, top of feet pain  chronic. Right lower back, right hip, right leg pain is chronic, positional  improved. Right knee pain/prepetellar effusion.   Skin: Negative for color change.  Neurological: Negative for speech difficulty, weakness and light-headedness.  Psychiatric/Behavioral: Negative for agitation, behavioral problems and sleep disturbance. The patient is not nervous/anxious.     Health Maintenance  Topic Date Due  . TETANUS/TDAP  04/11/2019  . COVID-19 Vaccine (4 - Booster for Moderna series) 05/14/2020  . INFLUENZA VACCINE  Completed  . DEXA SCAN  Completed  . PNA vac Low Risk Adult  Completed    Physical Exam: Vitals:   02/02/20 1506  BP: 128/68  Pulse: 67  Temp: (!) 97.5 F (36.4 C)  SpO2: 99%  Weight: 126 lb 6.4 oz (57.3 kg)  Height: 5\' 3"  (1.6 m)   Body mass index is 22.39 kg/m. Physical Exam Vitals and nursing note reviewed.  Constitutional:      Appearance: Normal appearance.  HENT:     Head: Normocephalic and atraumatic.     Mouth/Throat:     Mouth: Mucous membranes are moist.  Eyes:     Extraocular Movements: Extraocular movements intact.     Conjunctiva/sclera: Conjunctivae normal.     Pupils: Pupils are equal, round, and reactive to light.  Cardiovascular:     Rate and Rhythm: Normal rate and regular rhythm.     Heart sounds: Murmur heard.    Pulmonary:     Breath sounds: No rales.  Abdominal:     General: Bowel sounds are normal.     Palpations: Abdomen is soft.     Tenderness: There is no abdominal tenderness.  Musculoskeletal:        General: Swelling and tenderness present.     Cervical back: Normal range of motion and neck supple.     Right lower leg: Edema present.     Left lower leg: Edema present.     Comments: Very mild edema BLE. Lower back pain, radiculopathy R, Ortho 04/28/19. Right knee pain/prepetellar effusion.    Skin:  General: Skin is warm and dry.  Neurological:     General: No focal deficit present.     Mental Status: She is alert and oriented to person, place, and time. Mental status is at baseline.      Motor: No weakness.     Coordination: Coordination normal.     Gait: Gait abnormal.  Psychiatric:        Mood and Affect: Mood normal.        Behavior: Behavior normal.        Thought Content: Thought content normal.        Judgment: Judgment normal.     Labs reviewed: Basic Metabolic Panel: Recent Labs    02/15/19 0705 02/21/19 0949 08/22/19 1016 10/10/19 1434  NA 134* 135 130* 129*  K 4.2 3.2* 3.2* 4.0  CL 97* 96* 96* 95*  CO2 29 29 24 29   GLUCOSE 90 82 105* 95  BUN 14 10 12 15   CREATININE 0.74 0.74 0.78 0.73  CALCIUM 9.2 8.8* 9.2 9.0  TSH 4.04  --   --   --    Liver Function Tests: Recent Labs    02/21/19 0949 08/22/19 1016 10/10/19 1434  AST 24 18 25   ALT 32 19 25  ALKPHOS 46 39 43  BILITOT 0.5 0.7 0.6  PROT 7.0 6.5 6.5  ALBUMIN 4.2 3.9 3.9   No results for input(s): LIPASE, AMYLASE in the last 8760 hours. No results for input(s): AMMONIA in the last 8760 hours. CBC: Recent Labs    08/22/19 1016 10/10/19 1434 11/22/19 1034  WBC 3.4* 3.3* 4.1  NEUTROABS 2.4 2.2 2.9  HGB 12.7 11.8* 12.0  HCT 35.9* 34.1* 35.2*  MCV 116.6* 120.9* 119.7*  PLT 358 309 371   Lipid Panel: Recent Labs    02/15/19 0705  CHOL 163  HDL 50  LDLCALC 94  TRIG 94  CHOLHDL 3.3   No results found for: HGBA1C  Procedures since last visit: No results found.  Assessment/Plan  Osteoarthritis, multiple sites Lower back pain, radiculopathy R, Ortho 04/28/19, Ibuprofen II prn working with therapy.   Hyponatremia Hyponatremia, Na 129 10/10/19, f/u labs at oncology.    Osteopenia Osteopenia, takes Zometa.    URINARY INCONTINENCE, URGE, MILD Urinary frequency, stable, on Vesicare 10mg  qd.  Chronic constipation Constipation, stable, on MiraLax qd, Colace qd.  GERD (gastroesophageal reflux disease) GERD, stable, on Omeprazole.       Essential thrombocythemia ([place]) Essential thrombocythemia stable, on Hydrea, plt371 11/22/19   Restless leg  syndrome Hx of RLS, stable, on Gabapentin 300mg  qam 900mg  qpm.  Essential hypertension, benign HTN, Metoprolol 12.5mg  bid since 05/24/19, HCTZ 25mg  qd   Malignant neoplasm of upper-inner quadrant of right breast in female, estrogen receptor positive ([place]) Bilateral breat cancer, s/p lumpectomy, radiation, on Adjuvant, f/u oncology.   Morton's neuroma of third interspaces of both feet The R+L ball part feet pain when walking, not new, Hx of Metatarsalgia, Morton's neuroma   Prepatellar effusion of right knee the  right knee pain about 2-3 months, worsening, able to bear weight and walking as usual. It was noted knee prepatellar bursitis/effusion, pain when palpated. No redness or warmth. F/u Ortho.     Labs/tests ordered:  None  Next appt:  04/05/2020

## 2020-02-02 NOTE — Assessment & Plan Note (Signed)
Lower back pain, radiculopathy R, Ortho 04/28/19, Ibuprofen II prn working with therapy.

## 2020-02-02 NOTE — Assessment & Plan Note (Signed)
GERD, stable, on Omeprazole  

## 2020-02-02 NOTE — Assessment & Plan Note (Addendum)
Hyponatremia, Na 129 10/10/19, f/u labs at oncology.

## 2020-02-02 NOTE — Assessment & Plan Note (Addendum)
Essential thrombocythemia stable, on Hydrea, plt371 11/22/19

## 2020-02-02 NOTE — Assessment & Plan Note (Signed)
Urinary frequency, stable, on Vesicare 10mg qd.  

## 2020-02-02 NOTE — Assessment & Plan Note (Signed)
Constipation, stable, on MiraLax qd, Colace qd.   

## 2020-02-05 ENCOUNTER — Other Ambulatory Visit: Payer: Self-pay | Admitting: Hematology

## 2020-02-05 ENCOUNTER — Other Ambulatory Visit: Payer: Self-pay | Admitting: Nurse Practitioner

## 2020-02-06 ENCOUNTER — Telehealth: Payer: Self-pay | Admitting: Orthopaedic Surgery

## 2020-02-06 NOTE — Telephone Encounter (Signed)
Pt called wanting to know if she will be able to drive herself home after having the fluid drawn from her knee?  [phone]

## 2020-02-07 ENCOUNTER — Ambulatory Visit (INDEPENDENT_AMBULATORY_CARE_PROVIDER_SITE_OTHER): Payer: Medicare PPO

## 2020-02-07 ENCOUNTER — Other Ambulatory Visit: Payer: Self-pay

## 2020-02-07 ENCOUNTER — Encounter: Payer: Self-pay | Admitting: Orthopaedic Surgery

## 2020-02-07 ENCOUNTER — Ambulatory Visit: Payer: Medicare PPO | Admitting: Orthopaedic Surgery

## 2020-02-07 DIAGNOSIS — M25561 Pain in right knee: Secondary | ICD-10-CM | POA: Diagnosis not present

## 2020-02-07 DIAGNOSIS — M7041 Prepatellar bursitis, right knee: Secondary | ICD-10-CM

## 2020-02-07 MED ORDER — BUPIVACAINE HCL 0.25 % IJ SOLN
2.0000 mL | INTRAMUSCULAR | Status: AC | PRN
  Administered 2020-02-07: 2 mL via INTRA_ARTICULAR

## 2020-02-07 MED ORDER — LIDOCAINE HCL 1 % IJ SOLN
2.0000 mL | INTRAMUSCULAR | Status: AC | PRN
Start: 2020-02-07 — End: 2020-02-07
  Administered 2020-02-07: 2 mL

## 2020-02-07 MED ORDER — METHYLPREDNISOLONE ACETATE 40 MG/ML IJ SUSP
40.0000 mg | INTRAMUSCULAR | Status: AC | PRN
  Administered 2020-02-07: 40 mg via INTRA_ARTICULAR

## 2020-02-07 NOTE — Progress Notes (Signed)
Office Visit Note   Patient: Jill Myers           Date of Birth: Dec 27, 1930           MRN: [id number] Visit Date: 02/07/2020              Requested by: Mast, [name], NP [address],  [place] [address] PCP: Mast, [name], NP   Assessment & Plan: Visit Diagnoses:  1. Prepatellar bursitis of right knee   2. Right knee pain, unspecified chronicity     Plan: Impression is right knee traumatic prepatellar bursitis. Today, we aspirated 25 cc of blood-tinged fluid from the prepatellar bursa and then injected it with cortisone. We then applied a compression wrap. She will ice and take it easy over the next few days. Follow-up with [place] as needed.  Follow-Up Instructions: Return if symptoms worsen or fail to improve.   Orders:  Orders Placed This Encounter  Procedures  . Large Joint Inj: R knee  . XR KNEE 3 VIEW RIGHT   No orders of the defined types were placed in this encounter.     Procedures: Large Joint Inj: R knee on 02/07/2020 1:14 PM Indications: pain Details: 22 G needle, anterolateral approach Medications: 2 mL lidocaine 1 %; 2 mL bupivacaine 0.25 %; 40 mg methylPREDNISolone acetate 40 MG/ML      Clinical Data: No additional findings.   Subjective: Chief Complaint  Patient presents with  . Right Knee - Pain    HPI pleasant 87 year old female who comes in today with pain and swelling to the anterior right knee. She fell landing straight on her face several months ago and is noticed pain to her knee since. The pain became very tender to the touch over the past 2 to 3 weeks. The pain only occurs if she is applying pressure to the area. No pain with movement of the knee. No fevers or chills. No history of gout.  Review of Systems as detailed in HPI. All others reviewed and are negative.   Objective: Vital Signs: There were no vitals taken for this visit.  Physical Exam well-developed well-nourished female no acute distress. Alert and oriented  x3.  Ortho Exam right knee exam shows no joint effusion. Range of motion from 0 to 115 degrees. No joint line tenderness. She does have a moderate sized prepatellar bursa is moderately tender to the touch. No warmth or erythema. She is neurovascular intact distally.  Specialty Comments:  No specialty comments available.  Imaging: XR KNEE 3 VIEW RIGHT  Result Date: 02/07/2020 Advanced degenerative changes to the patellofemoral compartment with periarticular osteophytes    PMFS History: Patient Active Problem List   Diagnosis Date Noted  . Hyponatremia 02/02/2020  . Prepatellar effusion of right knee 02/02/2020  . Fall 05/24/2019  . Pain of back and right lower extremity 04/14/2019  . Osteoarthritis, multiple sites 12/24/2017  . Malignant neoplasm of upper-inner quadrant of right breast in female, estrogen receptor positive ([place]) 01/20/2017  . Malignant neoplasm of upper-outer quadrant of left breast in female, estrogen receptor positive ([place]) 01/20/2017  . Chronic constipation 06/19/2016  . Morton's neuroma of third interspaces of both feet 04/16/2015  . Osteopenia 10/06/2014  . Hypertension   . Essential thrombocythemia ([place]) 05/08/2014  . Cystitis 02/20/2014  . Insomnia 02/10/2014  . Metatarsalgia of both feet 11/28/2013  . Family history of Alzheimer's disease 11/11/2013  . Deglutition disorder 08/17/2012  . Left-sided chest wall pain 12/19/2011  .  Restless leg syndrome 06/09/2011  . GERD (gastroesophageal reflux disease) 08/16/2010  . Vitamin D deficiency 08/16/2010  . Advanced directives, counseling/discussion 08/16/2010  . Dysuria 05/21/2010  . Cystocele, unspecified ([place]) 05/21/2010  . DIZZINESS 01/18/2010  . URINARY INCONTINENCE, URGE, MILD 11/27/2009  . Hallux valgus, acquired 04/10/2009  . Essential hypertension, benign 02/22/2008  . HYPERTRIGLYCERIDEMIA 01/05/2007  . Disease of blood and blood forming organ 01/05/2007  . DIVERTICULOSIS OF COLON 03/05/2006    Past Medical History:  Diagnosis Date  . Bladder cystocele 05/21/2010  . Complication of anesthesia    deglutition in history  . Deglutition disorder 08/17/2012   Single episode of choking/aspiration; preceded by trouble swallowing saliva (thin liquids).  For SLP evaluation. Aug 17, 2012.    [name] Disease of blood and blood forming organ 01/05/2007   Qualifier: Diagnosis of  By: Oneida Alar MD, KARL    . Diverticulitis   . DIVERTICULOSIS OF COLON 03/05/2006   Qualifier: Diagnosis of  By: Samara Snide    . DIZZINESS 01/18/2010   Qualifier: Diagnosis of  By: Lindell Noe MD, Jeneen Rinks    . Essential hypertension, benign 02/22/2008   Qualifier: Diagnosis of  By: Lindell Noe MD, Jeneen Rinks     . Essential thrombocythemia ([place]) 01/31/11  . Family history of Alzheimer's disease 11/11/2013   Patient with family history of Alzheimers disease. She herself is very highly functional.  Plans for future visit with MMSE as primary function of the visit.    [name] GERD (gastroesophageal reflux disease) 08/16/2010  . HALLUX VALGUS, ACQUIRED 04/10/2009   Qualifier: Diagnosis of  By: [name] CMA,, Thekla    . Hypertension   . HYPERTRIGLYCERIDEMIA 01/05/2007   Qualifier: Diagnosis of  By: Oneida Alar MD, KARL    . Insomnia 02/10/2014  . Left-sided chest wall pain 12/19/2011    L sided chest wall pain follows a dermatomal distribution and raises the possibility of thoracic radiculopathic pain, which would be expected to improve with gabapentin as well. We discussed this at length and she will call to make me aware if this does not get better. Rib belt/girdle in the meantime; may consider rib films or CXR if not improving.     . Metatarsalgia of right foot 11/28/2013   Referral to [facility]   . Osteopenia   . Personal history of radiation therapy    03/2017 right breast  . Restless leg syndrome 06/09/2011  . Restless legs syndrome (RLS)   . URINARY INCONTINENCE, URGE, MILD 11/27/2009   Qualifier: Diagnosis of  By: Zebedee Iba NP, Manuela Schwartz    . Vitamin D deficiency  08/16/2010    Family History  Problem Relation Age of Onset  . Heart disease Mother   . Tuberculosis Father   . Heart disease Sister   . Heart disease Brother   . Alzheimer's disease Brother   . Alzheimer's disease Brother     Past Surgical History:  Procedure Laterality Date  . BIOPSY BREAST Left 1998   Dr. Rebekah Chesterfield  . BREAST EXCISIONAL BIOPSY    . BREAST LUMPECTOMY Right 02/20/2017   Procedure: RIGHT BREAST LUMPECTOMY;  Surgeon: Jovita Kussmaul, MD;  Location: [place];  Service: General;  Laterality: Right;  . BREAST LUMPECTOMY Left 02/20/2017   left lumpectomy  . BREAST LUMPECTOMY WITH RADIOACTIVE SEED LOCALIZATION Left 02/20/2017   Procedure: LEFT BREAST LUMPECTOMY WITH RADIOACTIVE SEED LOCALIZATION;  Surgeon: Jovita Kussmaul, MD;  Location: [place];  Service: General;  Laterality: Left;  . CATARACT EXTRACTION  2010  Dr. [name]  . CHOLECYSTECTOMY  1992  . COLONOSCOPY  2008  . MOHS SURGERY  2012   on face [facility] [place]  . NAILBED REPAIR  12/05/2010   Procedure: NAILBED REPAIR;  Surgeon: Cammie Sickle., MD;  Location: [place];  Service: Orthopedics;  Laterality: Right;  full thickness nail biopsy right hallux  . torn tenden  2012   hand Dr. Orie Rout   Social History   Occupational History  . Occupation: Tree surgeon: UNEMPLOYED  . Occupation: Retired- Paramedic  Tobacco Use  . Smoking status: Former Smoker    Years: 3.00    Quit date: 1958    Years since quitting: 64.1  . Smokeless tobacco: Never Used  Vaping Use  . Vaping Use: Never used  Substance and Sexual Activity  . Alcohol use: Yes    Alcohol/week: 0.0 standard drinks    Comment: Wine rarely.  . Drug use: No  . Sexual activity: Not Currently

## 2020-02-07 NOTE — Telephone Encounter (Signed)
Patient aware.

## 2020-02-08 DIAGNOSIS — Z85828 Personal history of other malignant neoplasm of skin: Secondary | ICD-10-CM | POA: Diagnosis not present

## 2020-02-08 DIAGNOSIS — L570 Actinic keratosis: Secondary | ICD-10-CM | POA: Diagnosis not present

## 2020-02-17 ENCOUNTER — Encounter: Payer: Self-pay | Admitting: Orthopaedic Surgery

## 2020-02-17 ENCOUNTER — Ambulatory Visit (INDEPENDENT_AMBULATORY_CARE_PROVIDER_SITE_OTHER): Payer: Medicare PPO | Admitting: Orthopaedic Surgery

## 2020-02-17 ENCOUNTER — Other Ambulatory Visit: Payer: Self-pay | Admitting: Hematology

## 2020-02-17 ENCOUNTER — Other Ambulatory Visit: Payer: Self-pay

## 2020-02-17 VITALS — Ht 63.0 in | Wt 126.0 lb

## 2020-02-17 DIAGNOSIS — M7041 Prepatellar bursitis, right knee: Secondary | ICD-10-CM | POA: Diagnosis not present

## 2020-02-17 DIAGNOSIS — D473 Essential (hemorrhagic) thrombocythemia: Secondary | ICD-10-CM

## 2020-02-17 MED ORDER — BUPIVACAINE HCL 0.25 % IJ SOLN
2.0000 mL | INTRAMUSCULAR | Status: AC | PRN
  Administered 2020-02-17: 2 mL via INTRA_ARTICULAR

## 2020-02-17 MED ORDER — LIDOCAINE HCL 1 % IJ SOLN
2.0000 mL | INTRAMUSCULAR | Status: AC | PRN
  Administered 2020-02-17: 2 mL

## 2020-02-17 MED ORDER — METHYLPREDNISOLONE ACETATE 40 MG/ML IJ SUSP
40.0000 mg | INTRAMUSCULAR | Status: AC | PRN
  Administered 2020-02-17: 40 mg via INTRA_ARTICULAR

## 2020-02-17 NOTE — Progress Notes (Signed)
Office Visit Note   Patient: Jill Myers           Date of Birth: 16-Apr-1930           MRN: [id number] Visit Date: 02/17/2020              Requested by: Mast, [name], NP [id number],  [place] [id number] PCP: Mast, [name], NP   Assessment & Plan: Visit Diagnoses:  1. Prepatellar bursitis of right knee     Plan: Impression is recurrent right knee prepatellar bursitis.  We reaspirated this today and obtain 20 cc of serous fluid.  I then injected this with cortisone.  Compression wrap applied which she will leave on for the next week.  She will avoid any exercises or activities where she is putting pressure on her knee.  Follow-up with [place] as needed.  Follow-Up Instructions: Return if symptoms worsen or fail to improve.   Orders:  Orders Placed This Encounter  Procedures  . Large Joint Inj: R knee   No orders of the defined types were placed in this encounter.     Procedures: Large Joint Inj: R knee on 02/17/2020 10:08 AM Indications: pain Details: 22 G needle, anterolateral approach Medications: 2 mL lidocaine 1 %; 2 mL bupivacaine 0.25 %; 40 mg methylPREDNISolone acetate 40 MG/ML      Clinical Data: No additional findings.   Subjective: Chief Complaint  Patient presents with  . Right Knee - Pain    HPI patient is a pleasant 87 year old female who comes in today with recurrent swelling to the right prepatellar bursa.  She was seen by [place] about a week and a half ago for this.  This was a little more tender at that point.  25 cc of fluid was aspirated and then cortisone was injected.  She was wrapped with a compression dressing which she left on for about 2 days.  She comes in today with recurrent swelling.  No pain.  She does note that she has been doing floor exercises where she puts a lot of pressure on her knees.  Review of Systems as detailed in HPI.  All others reviewed and are negative.   Objective: Vital Signs: Ht 5\' 3"  (1.6 m)   Wt 126 lb  (57.2 kg)   BMI 22.32 kg/m   Physical Exam well-developed well-nourished female no acute distress.  Alert and oriented x3.  Ortho Exam right knee exam shows fluctuance to th prepatellar bursa.  This is nontender.  No erythema or signs of infection or cellulitis.  Full range of motion of the knee.  She is neurovascular intact distally.  Specialty Comments:  No specialty comments available.  Imaging: No new imaging   PMFS History: Patient Active Problem List   Diagnosis Date Noted  . Hyponatremia 02/02/2020  . Prepatellar effusion of right knee 02/02/2020  . Fall 05/24/2019  . Pain of back and right lower extremity 04/14/2019  . Osteoarthritis, multiple sites 12/24/2017  . Malignant neoplasm of upper-inner quadrant of right breast in female, estrogen receptor positive ([place]) 01/20/2017  . Malignant neoplasm of upper-outer quadrant of left breast in female, estrogen receptor positive ([place]) 01/20/2017  . Chronic constipation 06/19/2016  . Morton's neuroma of third interspaces of both feet 04/16/2015  . Osteopenia 10/06/2014  . Hypertension   . Essential thrombocythemia ([place]) 05/08/2014  . Cystitis 02/20/2014  . Insomnia 02/10/2014  . Metatarsalgia of both feet 11/28/2013  . Family history of Alzheimer's disease 11/11/2013  .  Deglutition disorder 08/17/2012  . Left-sided chest wall pain 12/19/2011  . Restless leg syndrome 06/09/2011  . GERD (gastroesophageal reflux disease) 08/16/2010  . Vitamin D deficiency 08/16/2010  . Advanced directives, counseling/discussion 08/16/2010  . Dysuria 05/21/2010  . Cystocele, unspecified (CODE) 05/21/2010  . DIZZINESS 01/18/2010  . URINARY INCONTINENCE, URGE, MILD 11/27/2009  . Hallux valgus, acquired 04/10/2009  . Essential hypertension, benign 02/22/2008  . HYPERTRIGLYCERIDEMIA 01/05/2007  . Disease of blood and blood forming organ 01/05/2007  . DIVERTICULOSIS OF COLON 03/05/2006   Past Medical History:  Diagnosis Date  . Bladder  cystocele 05/21/2010  . Complication of anesthesia    deglutition in history  . Deglutition disorder 08/17/2012   Single episode of choking/aspiration; preceded by trouble swallowing saliva (thin liquids).  For SLP evaluation. Aug 17, 2012.    [name] Disease of blood and blood forming organ 01/05/2007   Qualifier: Diagnosis of  By: Oneida Alar MD, KARL    . Diverticulitis   . DIVERTICULOSIS OF COLON 03/05/2006   Qualifier: Diagnosis of  By: Samara Snide    . DIZZINESS 01/18/2010   Qualifier: Diagnosis of  By: Lindell Noe MD, Jeneen Rinks    . Essential hypertension, benign 02/22/2008   Qualifier: Diagnosis of  By: Lindell Noe MD, Jeneen Rinks     . Essential thrombocythemia ([place]) 01/31/11  . Family history of Alzheimer's disease 11/11/2013   Patient with family history of Alzheimers disease. She herself is very highly functional.  Plans for future visit with [facility] as primary function of the visit.    [name] GERD (gastroesophageal reflux disease) 08/16/2010  . HALLUX VALGUS, ACQUIRED 04/10/2009   Qualifier: Diagnosis of  By: [name] CMA,, [place]    . Hypertension   . HYPERTRIGLYCERIDEMIA 01/05/2007   Qualifier: Diagnosis of  By: Oneida Alar MD, KARL    . Insomnia 02/10/2014  . Left-sided chest wall pain 12/19/2011    L sided chest wall pain follows a dermatomal distribution and raises the possibility of thoracic radiculopathic pain, which would be expected to improve with gabapentin as well. We discussed this at length and she will call to make me aware if this does not get better. Rib belt/girdle in the meantime; may consider rib films or CXR if not improving.     . Metatarsalgia of right foot 11/28/2013   Referral to [facility]   . Osteopenia   . Personal history of radiation therapy    03/2017 right breast  . Restless leg syndrome 06/09/2011  . Restless legs syndrome (RLS)   . URINARY INCONTINENCE, URGE, MILD 11/27/2009   Qualifier: Diagnosis of  By: Zebedee Iba NP, Manuela Schwartz    . Vitamin D deficiency 08/16/2010    Family History  Problem Relation Age of  Onset  . Heart disease Mother   . Tuberculosis Father   . Heart disease Sister   . Heart disease Brother   . Alzheimer's disease Brother   . Alzheimer's disease Brother     Past Surgical History:  Procedure Laterality Date  . BIOPSY BREAST Left 1998   Dr. Rebekah Chesterfield  . BREAST EXCISIONAL BIOPSY    . BREAST LUMPECTOMY Right 02/20/2017   Procedure: RIGHT BREAST LUMPECTOMY;  Surgeon: Jovita Kussmaul, MD;  Location: [place];  Service: General;  Laterality: Right;  . BREAST LUMPECTOMY Left 02/20/2017   left lumpectomy  . BREAST LUMPECTOMY WITH RADIOACTIVE SEED LOCALIZATION Left 02/20/2017   Procedure: LEFT BREAST LUMPECTOMY WITH RADIOACTIVE SEED LOCALIZATION;  Surgeon: Jovita Kussmaul, MD;  Location: [place];  Service:  General;  Laterality: Left;  . CATARACT EXTRACTION  2010   Dr. [name]  . CHOLECYSTECTOMY  1992  . COLONOSCOPY  2008  . MOHS SURGERY  2012   on face [facility] [place]  . NAILBED REPAIR  12/05/2010   Procedure: NAILBED REPAIR;  Surgeon: Cammie Sickle., MD;  Location: [place];  Service: Orthopedics;  Laterality: Right;  full thickness nail biopsy right hallux  . torn tenden  2012   hand Dr. Orie Rout   Social History   Occupational History  . Occupation: Tree surgeon: UNEMPLOYED  . Occupation: Retired- Paramedic  Tobacco Use  . Smoking status: Former Smoker    Years: 3.00    Quit date: 1958    Years since quitting: 64.1  . Smokeless tobacco: Never Used  Vaping Use  . Vaping Use: Never used  Substance and Sexual Activity  . Alcohol use: Yes    Alcohol/week: 0.0 standard drinks    Comment: Wine rarely.  . Drug use: No  . Sexual activity: Not Currently

## 2020-02-17 NOTE — Progress Notes (Signed)
[place]   [phone] [phone]   Clinic Follow up Note   Patient Care Team: Mast, [name], NP as PCP - General (Internal Medicine) [name], MD as Consulting Physician (Dermatology) [name], MD as Consulting Physician ([place]) Jovita Kussmaul, MD as Consulting Physician (General Surgery) Raye Sorrow, MD as Consulting Physician (Pulmonary Disease) Truitt Merle, MD as Consulting Physician (Hematology) Gery Pray, MD as Consulting Physician (Radiation Oncology) Delice Bison, Charlestine Massed, NP as Nurse Practitioner (Hematology and Oncology) Annia Belt, MD as Consulting Physician (Oncology)  Date of Service:  02/22/2020  CHIEF COMPLAINT: Follow up of right and left breastand ET  SUMMARY OF ONCOLOGIC HISTORY: Oncology History Overview Note  Cancer Staging Malignant neoplasm of upper-inner quadrant of right breast in female, estrogen receptor positive ([place]) Staging form: Breast, AJCC 8th Edition - Clinical stage from 01/07/2017: Stage IA (cT1c, cN0, cM0, G3, ER: Positive, PR: Positive, HER2: Negative) - Signed by Truitt Merle, MD on 01/20/2017  Malignant neoplasm of upper-outer quadrant of left breast in female, estrogen receptor positive ([place]) Staging form: Breast, AJCC 8th Edition - Clinical stage from 01/07/2017: Stage IA (cT1b, cN0, cM0, G2, ER: Positive, PR: Positive, HER2: Negative) - Signed by Truitt Merle, MD on 01/20/2017     Malignant neoplasm of upper-inner quadrant of right breast in female, estrogen receptor positive ([place])  01/01/2017 Mammogram   IMPRESSION: 1. 2.0 cm mass in the 2 o'clock position of the right breast with imaging features highly suspicious for malignancy. 2. 0.7 cm mass in the 2 o'clock position of the left breast with imaging features highly suspicious for malignancy. 3. No abnormal appearing axillary lymph nodes on either side.   01/07/2017 Receptors her2   Right Breast: Prognostic  indicators significant for: ER, 95% positive and PR, 50% positive, both with strong staining intensity. Proliferation marker Ki67 at 30. HER2 negative. RATIO OF HER2/CEP17 SIGNALS 0.81 AVERAGE HER2 COPY NUMBER PER CELL 1.70  Left Breast: Prognostic indicators significant for: ER, 100% positive and PR, 100% positive, both with strong staining intensity. Proliferation marker Ki67 at 10. HER2 negative. RATIO OF HER2/CEP17 SIGNALS 1.75 AVERAGE HER2 COPY NUMBER PER CELL 3.15   01/07/2017 Initial Biopsy   Diagnosis 1. Breast, right, needle core biopsy, 2:00 o'clock - INVASIVE DUCTAL CARCINOMA, SEE COMMENT. 2. Breast, left, needle core biopsy, 2:00 o'clock - INVASIVE MAMMARY CARCINOMA, SEE COMMENT.    01/20/2017 Initial Diagnosis   Malignant neoplasm of upper-inner quadrant of right breast in female, estrogen receptor positive ([place])   01/2017 - 03/2018 Anti-estrogen oral therapy   Anastrozole 1 mg daily started on 01/2017, stopped around 01/2018 due to diffuse joint pain. She switched to Exemestane on 03/26/2018 but stopped after 1 week due to gluteal pain.    02/20/2017 Surgery   RIGHT BREAST LUMPECTOMY (Right) LEFT BREAST LUMPECTOMY WITH RADIOACTIVE SEED LOCALIZATION (Left)  Per Dr. Marlou Starks    02/20/2017 Pathology Results    Diagnosis 1. Breast, lumpectomy, Left w/ seed - INVASIVE DUCTAL CARCINOMA, GRADE I/III, SPANNING 1.1 CM. - THE SURGICAL RESECTION MARGINS ARE NEGATIVE FOR CARCINOMA. - SEE ONCOLOGY TABLE BELOW. 2. Breast, lumpectomy, Right - INVASIVE DUCTAL CARCINOMA, GRADE II/III, SPANNING 2.1 CM. - DUCTAL CARCINOMA IN SITU, INTERMEDIATE GRADE. - PERINEURAL INVASION IS IDENTIFIED. - THE SURGICAL RESECTION MARGINS ARE NEGATIVE FOR CARCINOMA. - SEE ONCOLOGY TABLE BELOW. 3. Breast, excision, Right inferior - LOBULAR NEOPLASIA (ATYPICAL LOBULAR HYPERPLASIA). - FAT NECROSIS. - SEE COMMENT.  Microscopic Comment 1. BREAST, INVASIVE TUMOR (LEFT) Procedure: Seed localized  lumpectomy Laterality: Left Tumor Size: 1.1 cm (gross measurement). Histologic Type: Ductal Grade: I Tubular Differentiation: 2 Nuclear Pleomorphism: 1 Mitotic Count: 1 Ductal Carcinoma in Situ (DCIS): Not identified Extent of Tumor: Confined to breast parenchyma. Margins: Greater than 0.2 cm to all margins. Regional Lymph Nodes: None examined. Breast Prognostic Profile: Case [id number] Estrogen Receptor: 100%, strong Progesterone Receptor: 100%, strong Her2: No amplification was detected. The ratio was 0.81 Ki-67: 10% Best tumor block for sendout testing: 1B Pathologic Stage Classification (pTNM, AJCC 8th Edition): Primary Tumor (pT): pT1c Regional Lymph Nodes (pN): pNX Distant Metastases (pM): pMX  2. BREAST, INVASIVE TUMOR (RIGHT) Procedure: Lumpectomy Laterality: Right Tumor Size: 2.1 cm (gross measurement) Histologic Type: Ductal Grade: II Tubular Differentiation: 3 Nuclear Pleomorphism: 2 Mitotic Count: 1 Ductal Carcinoma in Situ (DCIS): Present, intermediate grade Extent of Tumor: Confined to breast parenchyma. Margins: Greater than 0.2 cm to all margins Regional Lymph Nodes: None examined. Breast Prognostic Profile: Case [id number] Estrogen Receptor: 95%, strong Progesterone Receptor: 50%, strong Her2: No amplification was detected. The ratio was 0.81 Ki-67: 30% Best tumor block for sendout testing: 2A Pathologic Stage Classification (pTNM, AJCC 8th Edition): Primary Tumor (pT): pT2 Regional Lymph Nodes (pN): pNX Distant Metastases (pM): pMX 3. The surgical resection margin(s) of the specimen were inked and microscopically evaluated.   03/31/2017 - 05/11/2017 Radiation Therapy   1. Right Breast / 50 Gy in 25 fractions 2. Right Axilla / 45 Gy in 25 fractions 3. Right Breast Boost / 10 Gy in 5 fractions   Malignant neoplasm of upper-outer quadrant of left breast in female, estrogen receptor positive ([place])  01/20/2017 Initial Diagnosis   Malignant  neoplasm of upper-outer quadrant of left breast in female, estrogen receptor positive ([place])   02/20/2017 Surgery   RIGHT BREAST LUMPECTOMY (Right) LEFT BREAST LUMPECTOMY WITH RADIOACTIVE SEED LOCALIZATION (Left)  Per Dr. Marlou Starks     02/12/2018 Mammogram   IMPRESSION: No mammographic evidence of breast malignancy       CURRENT THERAPY:  -Breast Cancer Surveillance -Hydrea 1030m daily, dose increased in Jan 2020. Then increased to 10072mdaily in morning with extra 50022mn Mondays and Thursdays. Reduced to 1000m19mce daily everyday since 10/2019. Reduced Hydrea to 1000mg11me daily except 500mg 81mondays and Thursdays starting 02/22/20.  -Zometa every 6 months for 2 years starting8/14/20-2/16/22  INTERVAL HISTORY:  Jill RCatriona Dillenbeckre for a follow up of right breast caner. She was last seen by me 4 months ago. She presents to the clinic alone. She notes she is doing well. She notes no recent health complications recently. She notes she is able to take care of herself well in independent living. She notes she is not able to get any nursing help on site. She does exercise classes on site and PT exercises herself. She cooks mostly for herself with 1 given meal by facility. She notes inflammation in her right knee after fall. She continues to f/u with ortho for draining as needed. She has only minimal pain when she walks. She notes the right breast tenderness with palpable at scar is stable and mild. She notes one of her breast is droopier than the other. She takes hydrea 1000mg e20mday.      REVIEW OF SYSTEMS:   Constitutional: Denies fevers, chills or abnormal weight loss Eyes: Denies blurriness of vision Ears, nose, mouth, throat, and face: Denies mucositis or sore throat Respiratory: Denies cough, dyspnea or wheezes Cardiovascular: Denies palpitation, chest discomfort or lower extremity swelling Gastrointestinal:  Denies nausea, heartburn or change in bowel habits Skin:  Denies abnormal skin rashes MKS: (+) right Knee inflammation  Lymphatics: Denies new lymphadenopathy or easy bruising Neurological:Denies numbness, tingling or new weaknesses Behavioral/Psych: Mood is stable, no new changes  Breast: (+) Tenderness around right breast scar  All other systems were reviewed with the patient and are negative.  MEDICAL HISTORY:  Past Medical History:  Diagnosis Date  . Bladder cystocele 05/21/2010  . Complication of anesthesia    deglutition in history  . Deglutition disorder 08/17/2012   Single episode of choking/aspiration; preceded by trouble swallowing saliva (thin liquids).  For SLP evaluation. Aug 17, 2012.    [name] Disease of blood and blood forming organ 01/05/2007   Qualifier: Diagnosis of  By: Oneida Alar MD, KARL    . Diverticulitis   . DIVERTICULOSIS OF COLON 03/05/2006   Qualifier: Diagnosis of  By: Samara Snide    . DIZZINESS 01/18/2010   Qualifier: Diagnosis of  By: Lindell Noe MD, Jeneen Rinks    . Essential hypertension, benign 02/22/2008   Qualifier: Diagnosis of  By: Lindell Noe MD, Jeneen Rinks     . Essential thrombocythemia ([place]) 01/31/11  . Family history of Alzheimer's disease 11/11/2013   Patient with family history of Alzheimers disease. She herself is very highly functional.  Plans for future visit with MMSE as primary function of the visit.    [name] GERD (gastroesophageal reflux disease) 08/16/2010  . HALLUX VALGUS, ACQUIRED 04/10/2009   Qualifier: Diagnosis of  By: [name] CMA,, [place]    . Hypertension   . HYPERTRIGLYCERIDEMIA 01/05/2007   Qualifier: Diagnosis of  By: Oneida Alar MD, KARL    . Insomnia 02/10/2014  . Left-sided chest wall pain 12/19/2011    L sided chest wall pain follows a dermatomal distribution and raises the possibility of thoracic radiculopathic pain, which would be expected to improve with gabapentin as well. We discussed this at length and she will call to make me aware if this does not get better. Rib belt/girdle in the meantime; may consider rib films or  CXR if not improving.     . Metatarsalgia of right foot 11/28/2013   Referral to [facility]   . Osteopenia   . Personal history of radiation therapy    03/2017 right breast  . Restless leg syndrome 06/09/2011  . Restless legs syndrome (RLS)   . URINARY INCONTINENCE, URGE, MILD 11/27/2009   Qualifier: Diagnosis of  By: Zebedee Iba NP, Manuela Schwartz    . Vitamin D deficiency 08/16/2010    SURGICAL HISTORY: Past Surgical History:  Procedure Laterality Date  . BIOPSY BREAST Left 1998   Dr. Rebekah Chesterfield  . BREAST EXCISIONAL BIOPSY    . BREAST LUMPECTOMY Right 02/20/2017   Procedure: RIGHT BREAST LUMPECTOMY;  Surgeon: Jovita Kussmaul, MD;  Location: [place];  Service: General;  Laterality: Right;  . BREAST LUMPECTOMY Left 02/20/2017   left lumpectomy  . BREAST LUMPECTOMY WITH RADIOACTIVE SEED LOCALIZATION Left 02/20/2017   Procedure: LEFT BREAST LUMPECTOMY WITH RADIOACTIVE SEED LOCALIZATION;  Surgeon: Jovita Kussmaul, MD;  Location: [place];  Service: General;  Laterality: Left;  . CATARACT EXTRACTION  2010   Dr. [name]  . CHOLECYSTECTOMY  1992  . COLONOSCOPY  2008  . MOHS SURGERY  2012   on face [facility] [place]  . NAILBED REPAIR  12/05/2010   Procedure: NAILBED REPAIR;  Surgeon: Cammie Sickle., MD;  Location: [place];  Service: Orthopedics;  Laterality: Right;  full thickness nail  biopsy right hallux  . torn tenden  2012   hand Dr. Orie Rout    I have reviewed the social history and family history with the patient and they are unchanged from previous note.  ALLERGIES:  is allergic to doxycycline, macrodantin [nitrofurantoin], and sulfamethoxazole-trimethoprim.  MEDICATIONS:  Current Outpatient Medications  Medication Sig Dispense Refill  . hydroxyurea (HYDREA) 500 MG capsule Take 500 mg by mouth daily. May take with food to minimize GI side effects. Take 1 tablet (5102m) on Monday and Thursday, take 2 talbets (10021m on Sunday, Tuesday,  Wednesday, Friday, and Saturday    . aspirin 81 MG tablet Take 81 mg by mouth daily.    . calcium carbonate (OSCAL) 1500 (600 Ca) MG TABS tablet Take 600 mg of elemental calcium by mouth in the morning and at bedtime.     . cholecalciferol (VITAMIN D) 1000 UNITS tablet Take 2,000 Units by mouth daily.     . Marland Kitchenabapentin (NEURONTIN) 300 MG capsule TAKE 1 CAPSULE BY MOUTH EVERY MORNING AND TAKE 3 CAPSULES BY MOUTH EVERY EVENING 360 capsule 1  . GLUCOSAMINE-CHONDROITIN PO Take by mouth. 1500-1200  Take two tablet daily    . hydrochlorothiazide (HYDRODIURIL) 25 MG tablet TAKE 1 TABLET BY MOUTH EVERY DAY 90 tablet 1  . ibuprofen (ADVIL,MOTRIN) 200 MG tablet Take 400 mg by mouth as needed.     . Magnesium 500 MG TABS Take 1 tablet by mouth daily.    . metoprolol tartrate (LOPRESSOR) 25 MG tablet TAKE 1/2 TABLET BY MOUTH TWICE A DAY 90 tablet 1  . Omega-3 Fatty Acids (FISH OIL PO) Take by mouth.    . Marland Kitchenmeprazole (PRILOSEC) 20 MG capsule TAKE 1 CAPSULE BY MOUTH EVERY DAY 90 capsule 1  . polyethylene glycol (MIRALAX / GLYCOLAX) packet Take 17 g by mouth daily.    . potassium chloride SA (KLOR-CON) 20 MEQ tablet Take 20 mEq by mouth daily.    . Marland KitchenyridOXINE (VITAMIN B-6) 100 MG tablet Take 100 mg by mouth daily.    . solifenacin (VESICARE) 5 MG tablet Take 5 mg by mouth daily.     No current facility-administered medications for this visit.    PHYSICAL EXAMINATION: ECOG PERFORMANCE STATUS: 1 - Symptomatic but completely ambulatory  Vitals:   02/22/20 1049  BP: 133/83  Pulse: 69  Resp: 15  Temp: 98.6 F (37 C)  SpO2: 98%   Filed Weights   02/22/20 1049  Weight: 125 lb 9.6 oz (57 kg)    GENERAL:alert, no distress and comfortable SKIN: skin color, texture, turgor are normal, no rashes or significant lesions EYES: normal, Conjunctiva are pink and non-injected, sclera clear  NECK: supple, thyroid normal size, non-tender, without nodularity LYMPH:  no palpable lymphadenopathy in the cervical,  axillary  LUNGS: clear to auscultation and percussion with normal breathing effort HEART: regular rate & rhythm and no murmurs (+) Mild edema of right knee  ABDOMEN:abdomen soft, non-tender and normal bowel sounds Musculoskeletal:no cyanosis of digits and no clubbing  NEURO: alert & oriented x 3 with fluent speech, no focal motor/sensory deficits BREAST: S/p b/l lumpectomy: Surgical incisions healed well with mild scar tissue in right breast incision (+) Mild tenderness of right breast incision. No palpable mass, nodules or adenopathy bilaterally. Breast exam benign.   LABORATORY DATA:  I have reviewed the data as listed CBC Latest Ref Rng & Units 02/22/2020 11/22/2019 10/10/2019  WBC 4.0 - 10.5 K/uL 5.6 4.1 3.3(L)  Hemoglobin 12.0 - 15.0 g/dL 10.9(L) 12.0 11.8(L)  Hematocrit 36.0 - 46.0 % 32.4(L) 35.2(L) 34.1(L)  Platelets 150 - 400 K/uL 193 371 309     CMP Latest Ref Rng & Units 02/22/2020 10/10/2019 08/22/2019  Glucose 70 - 99 mg/dL 84 95 105(H)  BUN 8 - 23 mg/dL 20 15 12   Creatinine 0.44 - 1.00 mg/dL 0.78 0.73 0.78  Sodium 135 - 145 mmol/L 131(L) 129(L) 130(L)  Potassium 3.5 - 5.1 mmol/L 3.3(L) 4.0 3.2(L)  Chloride 98 - 111 mmol/L 98 95(L) 96(L)  CO2 22 - 32 mmol/L 27 29 24   Calcium 8.9 - 10.3 mg/dL 8.4(L) 9.0 9.2  Total Protein 6.5 - 8.1 g/dL 6.4(L) 6.5 6.5  Total Bilirubin 0.3 - 1.2 mg/dL 0.7 0.6 0.7  Alkaline Phos 38 - 126 U/L 42 43 39  AST 15 - 41 U/L 22 25 18   ALT 0 - 44 U/L 31 25 19       RADIOGRAPHIC STUDIES: I have personally reviewed the radiological images as listed and agreed with the findings in the report. No results found.   ASSESSMENT & PLAN:  Vienne Corcoran is a 87 y.o. female with   1. Bilateral breast cancer, invasive ductal carcinoma, right upper inner quadrant, pT2N0M0, stage IA, ER+/PR+/HER2-, G3, left upper-outer quadrant,pT1cN0M0, stage IA, ER+/PR+/HER2-, G2 -She was diagnosed on 01/20/2017, and is s/pbilateral lumpectomy with negative  margins,followed by adjuvant right breastand axillaryradiation. -She tried anastrozole and Exemestane from 01/2017-01/2018 but stopped due to MSK pain. Given her very early stage breast cancer and her age,poor tolerance, will hold adjuvant antiestrogen therapy. -From a breast cancer standpoint she is stable. Her right breast tenderness around right breast incision is stable and related to surgery. Physical exam benign.  -Continue surveillance. Next mammogram on 03/23/20 -f/u in3 months with NP Lacie   2.Essential thrombocythemia -OnHydrea. Dose is adjusted as needed.  -She is on ASA to reduce risk of thrombosis -Goal to keep plt<500k. She has been on Hydrea 1031m daily everyday since 10/2019. She tolerates well  -Labs reviewed today, Hg 10.9, plt 193K, Sodium 131, K 3.3, Ca 8.4, protein 6.4. Given increasing anemia, will reduce Hydrea to 10059mdaily except 50037mn Mondays and Thursdays.  -Will monitor with labs in 6 and 12 weeks   3.Joint pain,Morton's neuroma of third interspaces of both feet,Restless leg syndrome, sciatica pain -She continues to takeGabapentin 300m51mnight, prn Ibuprofen. -She is able to mostly manage her arthritic pain with moderate [facility]. -After fall in early 2021. I strongly recommend she ambulate with cane and try to keep her phone nearby. -She has had right knee swelling after injury. She will continue to wrap knee and f/u with ortho as needed.   4. Osteopenia  -She had a DEXA on 02/12/2018 and it showed aT-score of -2.2at right hip. Her overall fracture risk of 16%. -To strengthen her bone she completed Zometa infusion q6mon16month4/20-2/16/22 for 2 years. -I encouraged her to continue her calcium and vitDsupplement -Next DEXA on 3/01/14/20 with Mammogram   5. HTN, GERD -on HCTZ, Omeprazole. Will continue to F/u with PCP  6. Hypokalemia  -She is on HCTZ which can  lower her potassium level. She is now on OTC oral potassium  -K 3.3 today (02/22/20), I encouraged her to increase K supplement   7. Social Support  -[facility] lives in [facility] in independently living and able to take care of herself well.  -She still drives herself but has had recent  car trouble's. If needed, SW can get her ride service covered to clinic.    PLAN: -Labs reviewed, Hg 10.9. Reduce Hydrea from 1068m daily to 10055monce daily except 500108mn Mondays and Thursdays.  -Proceed with final Zometa today  -Mammogram and DEXA on 03/23/20 -Lab in 6 weeks  -Lab and F/u with NP Lacie in 3 months    No problem-specific Assessment & Plan notes found for this encounter.   Orders Placed This Encounter  Procedures  . DG Bone Density    Standing Status:   Future    Standing Expiration Date:   02/21/2021    Scheduling Instructions:     Please schedule around same time of her mammogram on 3/18    Order Specific Question:   Reason for Exam (SYMPTOM  OR DIAGNOSIS REQUIRED)    Answer:   screening    Order Specific Question:   [facility]    [facility] The patient knows to call the clinic with any problems, questions or concerns. No barriers to learning was detected. The total time spent in the appointment was 30 minutes.     YanTruitt MerleD 02/22/2020   I, AmoJoslyn Devonm acting as scribe for YanTruitt MerleD.   I have reviewed the above documentation for accuracy and completeness, and I agree with the above.

## 2020-02-22 ENCOUNTER — Inpatient Hospital Stay: Payer: Medicare PPO

## 2020-02-22 ENCOUNTER — Telehealth: Payer: Self-pay | Admitting: Hematology

## 2020-02-22 ENCOUNTER — Inpatient Hospital Stay: Payer: Medicare PPO | Admitting: Hematology

## 2020-02-22 ENCOUNTER — Inpatient Hospital Stay: Payer: Medicare PPO | Attending: Hematology

## 2020-02-22 ENCOUNTER — Other Ambulatory Visit: Payer: Self-pay

## 2020-02-22 ENCOUNTER — Encounter: Payer: Self-pay | Admitting: Hematology

## 2020-02-22 VITALS — BP 133/83 | HR 69 | Temp 98.6°F | Resp 15 | Ht 63.0 in | Wt 125.6 lb

## 2020-02-22 DIAGNOSIS — K219 Gastro-esophageal reflux disease without esophagitis: Secondary | ICD-10-CM | POA: Insufficient documentation

## 2020-02-22 DIAGNOSIS — Z170 Estrogen receptor positive status [ER+]: Secondary | ICD-10-CM | POA: Diagnosis not present

## 2020-02-22 DIAGNOSIS — G5760 Lesion of plantar nerve, unspecified lower limb: Secondary | ICD-10-CM | POA: Diagnosis not present

## 2020-02-22 DIAGNOSIS — C50211 Malignant neoplasm of upper-inner quadrant of right female breast: Secondary | ICD-10-CM

## 2020-02-22 DIAGNOSIS — Z79811 Long term (current) use of aromatase inhibitors: Secondary | ICD-10-CM | POA: Insufficient documentation

## 2020-02-22 DIAGNOSIS — D473 Essential (hemorrhagic) thrombocythemia: Secondary | ICD-10-CM | POA: Insufficient documentation

## 2020-02-22 DIAGNOSIS — Z79899 Other long term (current) drug therapy: Secondary | ICD-10-CM | POA: Insufficient documentation

## 2020-02-22 DIAGNOSIS — E876 Hypokalemia: Secondary | ICD-10-CM | POA: Diagnosis not present

## 2020-02-22 DIAGNOSIS — G2581 Restless legs syndrome: Secondary | ICD-10-CM | POA: Insufficient documentation

## 2020-02-22 DIAGNOSIS — I10 Essential (primary) hypertension: Secondary | ICD-10-CM | POA: Diagnosis not present

## 2020-02-22 DIAGNOSIS — C50412 Malignant neoplasm of upper-outer quadrant of left female breast: Secondary | ICD-10-CM | POA: Diagnosis not present

## 2020-02-22 DIAGNOSIS — Z923 Personal history of irradiation: Secondary | ICD-10-CM | POA: Diagnosis not present

## 2020-02-22 DIAGNOSIS — Z7982 Long term (current) use of aspirin: Secondary | ICD-10-CM | POA: Diagnosis not present

## 2020-02-22 DIAGNOSIS — E2839 Other primary ovarian failure: Secondary | ICD-10-CM

## 2020-02-22 DIAGNOSIS — M2550 Pain in unspecified joint: Secondary | ICD-10-CM | POA: Diagnosis not present

## 2020-02-22 DIAGNOSIS — M8580 Other specified disorders of bone density and structure, unspecified site: Secondary | ICD-10-CM | POA: Insufficient documentation

## 2020-02-22 DIAGNOSIS — M85851 Other specified disorders of bone density and structure, right thigh: Secondary | ICD-10-CM

## 2020-02-22 LAB — CBC WITH DIFFERENTIAL (CANCER CENTER ONLY)
Abs Immature Granulocytes: 0.46 10*3/uL — ABNORMAL HIGH (ref 0.00–0.07)
Basophils Absolute: 0.2 10*3/uL — ABNORMAL HIGH (ref 0.0–0.1)
Basophils Relative: 4 %
Eosinophils Absolute: 0.2 10*3/uL (ref 0.0–0.5)
Eosinophils Relative: 3 %
HCT: 32.4 % — ABNORMAL LOW (ref 36.0–46.0)
Hemoglobin: 10.9 g/dL — ABNORMAL LOW (ref 12.0–15.0)
Immature Granulocytes: 8 %
Lymphocytes Relative: 21 %
Lymphs Abs: 1.2 10*3/uL (ref 0.7–4.0)
MCH: 40.2 pg — ABNORMAL HIGH (ref 26.0–34.0)
MCHC: 33.6 g/dL (ref 30.0–36.0)
MCV: 119.6 fL — ABNORMAL HIGH (ref 80.0–100.0)
Monocytes Absolute: 1.1 10*3/uL — ABNORMAL HIGH (ref 0.1–1.0)
Monocytes Relative: 19 %
Neutro Abs: 2.5 10*3/uL (ref 1.7–7.7)
Neutrophils Relative %: 45 %
Platelet Count: 193 10*3/uL (ref 150–400)
RBC: 2.71 MIL/uL — ABNORMAL LOW (ref 3.87–5.11)
RDW: 16.1 % — ABNORMAL HIGH (ref 11.5–15.5)
WBC Count: 5.6 10*3/uL (ref 4.0–10.5)
nRBC: 0.5 % — ABNORMAL HIGH (ref 0.0–0.2)

## 2020-02-22 LAB — CMP (CANCER CENTER ONLY)
ALT: 31 U/L (ref 0–44)
AST: 22 U/L (ref 15–41)
Albumin: 3.8 g/dL (ref 3.5–5.0)
Alkaline Phosphatase: 42 U/L (ref 38–126)
Anion gap: 6 (ref 5–15)
BUN: 20 mg/dL (ref 8–23)
CO2: 27 mmol/L (ref 22–32)
Calcium: 8.4 mg/dL — ABNORMAL LOW (ref 8.9–10.3)
Chloride: 98 mmol/L (ref 98–111)
Creatinine: 0.78 mg/dL (ref 0.44–1.00)
GFR, Estimated: >60 mL/min (ref >60)
Glucose, Bld: 84 mg/dL (ref 70–99)
Potassium: 3.3 mmol/L — ABNORMAL LOW (ref 3.5–5.1)
Sodium: 131 mmol/L — ABNORMAL LOW (ref 135–145)
Total Bilirubin: 0.7 mg/dL (ref 0.3–1.2)
Total Protein: 6.4 g/dL — ABNORMAL LOW (ref 6.5–8.1)

## 2020-02-22 MED ORDER — ZOLEDRONIC ACID 4 MG/100ML IV SOLN: 4.0000 mg | Freq: Once | INTRAVENOUS | Status: DC

## 2020-02-22 MED ORDER — ZOLEDRONIC ACID 4 MG/100ML IV SOLN
INTRAVENOUS | Status: AC
  Filled 2020-02-22: qty 100

## 2020-02-22 MED ORDER — SODIUM CHLORIDE 0.9 % IV SOLN
Freq: Once | INTRAVENOUS | Status: AC
  Filled 2020-02-22: qty 250

## 2020-02-22 NOTE — Progress Notes (Signed)
Per Dr Burr Medico pt to take 2 potassium tablets for the next week.  Double calcium and vitamin d supplements. Hold zometa and rescheduled for 3 months.  MS Raffo verbalized understanding.

## 2020-02-22 NOTE — Patient Instructions (Signed)

## 2020-02-22 NOTE — Telephone Encounter (Signed)
Scheduled follow-up appointments per 2/16 los. Patient is aware. 

## 2020-02-22 NOTE — Progress Notes (Signed)
Corr Ca = 8.56.  Hypocalcemic.  Hold Zometa today and repeat lab in 6 weeks per Dr. Burr Medico. Incr Ca++ dose at home to TID.  Kennith Center, Pharm.D., CPP 02/22/2020@11 :48 AM

## 2020-02-22 NOTE — Progress Notes (Signed)
Per Dr. Burr Medico no zometa today.  Patient was counseled to take Vitamin D and Calcium.  Patient verbalized understanding.

## 2020-02-27 ENCOUNTER — Telehealth: Payer: Self-pay | Admitting: Physician Assistant

## 2020-02-27 NOTE — Telephone Encounter (Signed)
Please see below and advise. Would you like for me to make follow up appointment?

## 2020-02-27 NOTE — Telephone Encounter (Signed)
I called patient and advised. She is not having a lot of pain, however, feels that the swelling is worse and that she knows that "something isn't right".  She states that the ace wrap is not really working for her as it moves around. I explained that it was not tight enough, and she states that she wore it tighter and had swelling in the foot. She is going to put the ace wrap back on (she removed it 6 days post appt) and has made a follow up visit for Friday morning. She would like to know if there is something else that you recommend? She does not want to come in for the appt if you are just going to tell her the same things that I have.  Please let me know if there is something else that you recommend her trying instead of appt.

## 2020-02-27 NOTE — Telephone Encounter (Signed)
Patient called asked for a call back from [place]. Patient said the fluid is back in her right knee and she want to know what's the next plan of care for her. The number to contact patient is [phone]

## 2020-02-27 NOTE — Telephone Encounter (Signed)
If she has used compression wrap and she has been avoiding her knee exercises maybe come back in if it is painful.  If it's not painful, would be ok to continue compression wrap for a little longer and continue to avoid exercises

## 2020-02-28 NOTE — Telephone Encounter (Signed)
Ok thanks 

## 2020-02-28 NOTE — Telephone Encounter (Signed)
FYI   I called patient to advise, had to leave voicemail. Just asked that patient keep follow up appointment scheduled on Friday as we discussed yesterday. The only other option that she has at this time is to wait and see if it gets better. She was very concerned when I spoke with her yesterday afternoon, so I told her to come in.

## 2020-02-28 NOTE — Telephone Encounter (Signed)
We can give it more time to see if it gets better on its own or we can have her come back in to discuss surgical excision

## 2020-02-29 DIAGNOSIS — L7211 Pilar cyst: Secondary | ICD-10-CM | POA: Diagnosis not present

## 2020-02-29 DIAGNOSIS — L570 Actinic keratosis: Secondary | ICD-10-CM | POA: Diagnosis not present

## 2020-02-29 DIAGNOSIS — Z85828 Personal history of other malignant neoplasm of skin: Secondary | ICD-10-CM | POA: Diagnosis not present

## 2020-03-02 ENCOUNTER — Ambulatory Visit: Payer: Medicare PPO | Admitting: Orthopaedic Surgery

## 2020-03-02 DIAGNOSIS — M7041 Prepatellar bursitis, right knee: Secondary | ICD-10-CM

## 2020-03-02 NOTE — Progress Notes (Signed)
Office Visit Note   Patient: Jill Myers           Date of Birth: 1930/10/16           MRN: [id number] Visit Date: 03/02/2020              Requested by: Mast, [name], NP [address],  [place] [address] PCP: Mast, [name], NP   Assessment & Plan: Visit Diagnoses:  1. Prepatellar bursitis of right knee     Plan: Impression is recurrent right prepatellar bursitis.  We talked about treatment options to include continuing conservative treatment in the form of compression and ice versus surgical excision.  Overall she mainly wants reassurance that it is not harmful to have the bursitis but is not interested in undergoing surgery.  I am in agreement that surgery should be the last resort.  I gave her extra 6 inch Ace wraps and showed her how to wrap her knee properly.  She will follow-up as needed.  Follow-Up Instructions: Return if symptoms worsen or fail to improve.   Orders:  No orders of the defined types were placed in this encounter.  No orders of the defined types were placed in this encounter.     Procedures: No procedures performed   Clinical Data: No additional findings.   Subjective: Chief Complaint  Patient presents with  . Right Knee - Pain    [name] returns today for follow-up of recurrent right prepatellar bursitis.  She has undergone 2 aspirations with cortisone injection for the first 1 and a Toradol injection the second 1.  She states that it helped temporarily.  She denies any constitutional symptoms.   Review of Systems   Objective: Vital Signs: There were no vitals taken for this visit.  Physical Exam  Ortho Exam Right knee shows a small prepatellar bursitis with fluctuance and fluid wave.  There is no evidence of infection.  Range of motion of the knee is painless. Specialty Comments:  No specialty comments available.  Imaging: No results found.   PMFS History: Patient Active Problem List   Diagnosis Date Noted  .  Hyponatremia 02/02/2020  . Prepatellar effusion of right knee 02/02/2020  . Fall 05/24/2019  . Pain of back and right lower extremity 04/14/2019  . Osteoarthritis, multiple sites 12/24/2017  . Malignant neoplasm of upper-inner quadrant of right breast in female, estrogen receptor positive ([place]) 01/20/2017  . Malignant neoplasm of upper-outer quadrant of left breast in female, estrogen receptor positive ([place]) 01/20/2017  . Chronic constipation 06/19/2016  . Morton's neuroma of third interspaces of both feet 04/16/2015  . Osteopenia 10/06/2014  . Hypertension   . Essential thrombocythemia ([place]) 05/08/2014  . Cystitis 02/20/2014  . Insomnia 02/10/2014  . Metatarsalgia of both feet 11/28/2013  . Family history of Alzheimer's disease 11/11/2013  . Deglutition disorder 08/17/2012  . Left-sided chest wall pain 12/19/2011  . Restless leg syndrome 06/09/2011  . GERD (gastroesophageal reflux disease) 08/16/2010  . Vitamin D deficiency 08/16/2010  . Advanced directives, counseling/discussion 08/16/2010  . Dysuria 05/21/2010  . Cystocele, unspecified (CODE) 05/21/2010  . DIZZINESS 01/18/2010  . URINARY INCONTINENCE, URGE, MILD 11/27/2009  . Hallux valgus, acquired 04/10/2009  . Essential hypertension, benign 02/22/2008  . HYPERTRIGLYCERIDEMIA 01/05/2007  . Disease of blood and blood forming organ 01/05/2007  . DIVERTICULOSIS OF COLON 03/05/2006   Past Medical History:  Diagnosis Date  . Bladder cystocele 05/21/2010  . Complication of anesthesia    deglutition in history  .  Deglutition disorder 08/17/2012   Single episode of choking/aspiration; preceded by trouble swallowing saliva (thin liquids).  For SLP evaluation. Aug 17, 2012.    [name] Disease of blood and blood forming organ 01/05/2007   Qualifier: Diagnosis of  By: Oneida Alar MD, KARL    . Diverticulitis   . DIVERTICULOSIS OF COLON 03/05/2006   Qualifier: Diagnosis of  By: Samara Snide    . DIZZINESS 01/18/2010   Qualifier: Diagnosis of   By: Lindell Noe MD, Jeneen Rinks    . Essential hypertension, benign 02/22/2008   Qualifier: Diagnosis of  By: Lindell Noe MD, Jeneen Rinks     . Essential thrombocythemia ([place]) 01/31/11  . Family history of Alzheimer's disease 11/11/2013   Patient with family history of Alzheimers disease. She herself is very highly functional.  Plans for future visit with MMSE as primary function of the visit.    [name] GERD (gastroesophageal reflux disease) 08/16/2010  . HALLUX VALGUS, ACQUIRED 04/10/2009   Qualifier: Diagnosis of  By: [name] CMA,, Thekla    . Hypertension   . HYPERTRIGLYCERIDEMIA 01/05/2007   Qualifier: Diagnosis of  By: Oneida Alar MD, KARL    . Insomnia 02/10/2014  . Left-sided chest wall pain 12/19/2011    L sided chest wall pain follows a dermatomal distribution and raises the possibility of thoracic radiculopathic pain, which would be expected to improve with gabapentin as well. We discussed this at length and she will call to make me aware if this does not get better. Rib belt/girdle in the meantime; may consider rib films or CXR if not improving.     . Metatarsalgia of right foot 11/28/2013   Referral to [facility]   . Osteopenia   . Personal history of radiation therapy    03/2017 right breast  . Restless leg syndrome 06/09/2011  . Restless legs syndrome (RLS)   . URINARY INCONTINENCE, URGE, MILD 11/27/2009   Qualifier: Diagnosis of  By: Zebedee Iba NP, Manuela Schwartz    . Vitamin D deficiency 08/16/2010    Family History  Problem Relation Age of Onset  . Heart disease Mother   . Tuberculosis Father   . Heart disease Sister   . Heart disease Brother   . Alzheimer's disease Brother   . Alzheimer's disease Brother     Past Surgical History:  Procedure Laterality Date  . BIOPSY BREAST Left 1998   Dr. Rebekah Chesterfield  . BREAST EXCISIONAL BIOPSY    . BREAST LUMPECTOMY Right 02/20/2017   Procedure: RIGHT BREAST LUMPECTOMY;  Surgeon: Jovita Kussmaul, MD;  Location: [place];  Service: General;  Laterality: Right;  . BREAST LUMPECTOMY  Left 02/20/2017   left lumpectomy  . BREAST LUMPECTOMY WITH RADIOACTIVE SEED LOCALIZATION Left 02/20/2017   Procedure: LEFT BREAST LUMPECTOMY WITH RADIOACTIVE SEED LOCALIZATION;  Surgeon: Jovita Kussmaul, MD;  Location: [place];  Service: General;  Laterality: Left;  . CATARACT EXTRACTION  2010   Dr. [name]  . CHOLECYSTECTOMY  1992  . COLONOSCOPY  2008  . MOHS SURGERY  2012   on face [facility] [place]  . NAILBED REPAIR  12/05/2010   Procedure: NAILBED REPAIR;  Surgeon: Cammie Sickle., MD;  Location: [place];  Service: Orthopedics;  Laterality: Right;  full thickness nail biopsy right hallux  . torn tenden  2012   hand Dr. Orie Rout   Social History   Occupational History  . Occupation: Tree surgeon: UNEMPLOYED  . Occupation: Retired- Paramedic  Tobacco Use  . Smoking  status: Former Smoker    Years: 3.00    Quit date: 1958    Years since quitting: 64.1  . Smokeless tobacco: Never Used  Vaping Use  . Vaping Use: Never used  Substance and Sexual Activity  . Alcohol use: Yes    Alcohol/week: 0.0 standard drinks    Comment: Wine rarely.  . Drug use: No  . Sexual activity: Not Currently

## 2020-03-12 ENCOUNTER — Other Ambulatory Visit: Payer: Self-pay

## 2020-03-12 ENCOUNTER — Encounter: Payer: Self-pay | Admitting: Family

## 2020-03-12 ENCOUNTER — Ambulatory Visit: Payer: Medicare PPO | Admitting: Family

## 2020-03-12 VITALS — BP 116/70 | HR 81 | Temp 97.5°F | Resp 16 | Ht 63.0 in | Wt 125.4 lb

## 2020-03-12 DIAGNOSIS — R399 Unspecified symptoms and signs involving the genitourinary system: Secondary | ICD-10-CM

## 2020-03-12 DIAGNOSIS — H9193 Unspecified hearing loss, bilateral: Secondary | ICD-10-CM

## 2020-03-12 LAB — POCT URINALYSIS DIPSTICK
Bilirubin, UA: NEGATIVE
Glucose, UA: NEGATIVE
Nitrite, UA: POSITIVE
Protein, UA: POSITIVE — AB
Spec Grav, UA: <=1.005 — AB (ref 1.010–1.025)
Urobilinogen, UA: NEGATIVE E.U./dL — AB
pH, UA: 8 (ref 5.0–8.0)

## 2020-03-12 MED ORDER — CRANBERRY 475 MG PO CAPS: 475.0000 mg | ORAL_CAPSULE | Freq: Two times a day (BID) | ORAL | 3 refills | Status: AC

## 2020-03-12 NOTE — Progress Notes (Signed)
Provider: Marlowe Sax FNP-C  Mast, [name], NP  Patient Care Team: Mast, [name], NP as PCP - General (Internal Medicine) [name], MD as Consulting Physician (Dermatology) [name], MD as Consulting Physician (Sports Medicine) Jovita Kussmaul, MD as Consulting Physician (General Surgery) Raye Sorrow, MD as Consulting Physician (Pulmonary Disease) Truitt Merle, MD as Consulting Physician (Hematology) Gery Pray, MD as Consulting Physician (Radiation Oncology) Delice Bison, Charlestine Massed, NP as Nurse Practitioner (Hematology and Oncology) Annia Belt, MD as Consulting Physician (Oncology)  Extended Emergency Contact Information Primary Emergency Contact: Trotti,Marty Address: [place] [address] [name] of [place] Phone: [phone] Mobile Phone: [phone] Relation: Son  Code Status: Full Code  Goals of care: Advanced Directive information Advanced Directives 03/12/2020  Does Patient Have a Medical Advance Directive? Yes  Type of Paramedic of [place];Living will  Does patient want to make changes to medical advance directive? No - Patient declined  Copy of [place] in Chart? Yes - validated most recent copy scanned in chart (See row information)  Would patient like information on creating a medical advance directive? -     Chief Complaint  Patient presents with  . Acute Visit    Possible UTI and ear concerns.    HPI:  Pt is a 87 y.o. female seen today for an acute visit for evaluation of possible UTI x 4 days.she is here with her friend.Both of them resides at [place] states this several days patient has been acting like she is " spaced out " .Has not been able to sleep due to having frequent urination.gets up every 5 minutes to use the bathroom.Feels like she emptying her bladder well.appetite not so good.she denies any  dysuria,urgency,burning,nausea,vomiting or bladder distension.Has chronic back pain which has not worsen.   Decreased hearing loss.It been fading for so long.thinks ears need cleaning. Does let shower water run into her ears to try and clean them.she denies any pain or ringing in the ears.   Past Medical History:  Diagnosis Date  . Bladder cystocele 05/21/2010  . Complication of anesthesia    deglutition in history  . Deglutition disorder 08/17/2012   Single episode of choking/aspiration; preceded by trouble swallowing saliva (thin liquids).  For SLP evaluation. Aug 17, 2012.    [name] Disease of blood and blood forming organ 01/05/2007   Qualifier: Diagnosis of  By: Oneida Alar MD, KARL    . Diverticulitis   . DIVERTICULOSIS OF COLON 03/05/2006   Qualifier: Diagnosis of  By: Samara Snide    . DIZZINESS 01/18/2010   Qualifier: Diagnosis of  By: Lindell Noe MD, Jeneen Rinks    . Essential hypertension, benign 02/22/2008   Qualifier: Diagnosis of  By: Lindell Noe MD, Jeneen Rinks     . Essential thrombocythemia ([place]) 01/31/11  . Family history of Alzheimer's disease 11/11/2013   Patient with family history of Alzheimers disease. She herself is very highly functional.  Plans for future visit with MMSE as primary function of the visit.    [name] GERD (gastroesophageal reflux disease) 08/16/2010  . HALLUX VALGUS, ACQUIRED 04/10/2009   Qualifier: Diagnosis of  By: [name] CMA,, Thekla    . Hypertension   . HYPERTRIGLYCERIDEMIA 01/05/2007   Qualifier: Diagnosis of  By: Oneida Alar MD, KARL    . Insomnia 02/10/2014  . Left-sided chest wall pain 12/19/2011    L sided chest wall pain follows a dermatomal distribution  and raises the possibility of thoracic radiculopathic pain, which would be expected to improve with gabapentin as well. We discussed this at length and she will call to make me aware if this does not get better. Rib belt/girdle in the meantime; may consider rib films or CXR if not improving.     . Metatarsalgia of right foot 11/28/2013    Referral to [facility]   . Osteopenia   . Personal history of radiation therapy    03/2017 right breast  . Restless leg syndrome 06/09/2011  . Restless legs syndrome (RLS)   . URINARY INCONTINENCE, URGE, MILD 11/27/2009   Qualifier: Diagnosis of  By: Zebedee Iba NP, Manuela Schwartz    . Vitamin D deficiency 08/16/2010   Past Surgical History:  Procedure Laterality Date  . BIOPSY BREAST Left 1998   Dr. Rebekah Chesterfield  . BREAST EXCISIONAL BIOPSY    . BREAST LUMPECTOMY Right 02/20/2017   Procedure: RIGHT BREAST LUMPECTOMY;  Surgeon: Jovita Kussmaul, MD;  Location: [place];  Service: General;  Laterality: Right;  . BREAST LUMPECTOMY Left 02/20/2017   left lumpectomy  . BREAST LUMPECTOMY WITH RADIOACTIVE SEED LOCALIZATION Left 02/20/2017   Procedure: LEFT BREAST LUMPECTOMY WITH RADIOACTIVE SEED LOCALIZATION;  Surgeon: Jovita Kussmaul, MD;  Location: [place];  Service: General;  Laterality: Left;  . CATARACT EXTRACTION  2010   Dr. [name]  . CHOLECYSTECTOMY  1992  . COLONOSCOPY  2008  . MOHS SURGERY  2012   on face [facility] [place]  . NAILBED REPAIR  12/05/2010   Procedure: NAILBED REPAIR;  Surgeon: Cammie Sickle., MD;  Location: [place];  Service: Orthopedics;  Laterality: Right;  full thickness nail biopsy right hallux  . torn tenden  2012   hand Dr. Orie Rout    Allergies  Allergen Reactions  . Doxycycline Nausea And Vomiting  . Macrodantin [Nitrofurantoin] Nausea And Vomiting  . Sulfamethoxazole-Trimethoprim Rash    REACTION: Rash after completing course of Septra    Outpatient Encounter Medications as of 03/12/2020  Medication Sig  . aspirin 81 MG tablet Take 81 mg by mouth daily.  . calcium carbonate (OSCAL) 1500 (600 Ca) MG TABS tablet Take 600 mg of elemental calcium by mouth in the morning and at bedtime.   . cholecalciferol (VITAMIN D) 1000 UNITS tablet Take 2,000 Units by mouth daily.   [name] gabapentin (NEURONTIN) 300 MG capsule TAKE 1  CAPSULE BY MOUTH EVERY MORNING AND TAKE 3 CAPSULES BY MOUTH EVERY EVENING  . GLUCOSAMINE-CHONDROITIN PO Take by mouth. 1500-1200  Take two tablet daily  . hydrochlorothiazide (HYDRODIURIL) 25 MG tablet TAKE 1 TABLET BY MOUTH EVERY DAY  . hydroxyurea (HYDREA) 500 MG capsule Take 500 mg by mouth daily. May take with food to minimize GI side effects. Take 1 tablet (500mg ) on Monday and Thursday, take 2 talbets (1000mg ) on Sunday, Tuesday, Wednesday, Friday, and Saturday  . ibuprofen (ADVIL,MOTRIN) 200 MG tablet Take 400 mg by mouth as needed.   . Magnesium 500 MG TABS Take 1 tablet by mouth daily.  . metoprolol tartrate (LOPRESSOR) 25 MG tablet TAKE 1/2 TABLET BY MOUTH TWICE A DAY  . Omega-3 Fatty Acids (FISH OIL PO) Take by mouth.  [name] omeprazole (PRILOSEC) 20 MG capsule TAKE 1 CAPSULE BY MOUTH EVERY DAY  . polyethylene glycol (MIRALAX / GLYCOLAX) packet Take 17 g by mouth daily.  . potassium chloride SA (KLOR-CON) 20 MEQ tablet Take 20 mEq by mouth daily.  [name] pyridOXINE (VITAMIN B-6)  100 MG tablet Take 100 mg by mouth daily.  . solifenacin (VESICARE) 5 MG tablet Take 5 mg by mouth daily.   No facility-administered encounter medications on file as of 03/12/2020.    Review of Systems  Constitutional: Negative for appetite change, chills, fatigue and fever.  HENT: Positive for hearing loss. Negative for congestion, rhinorrhea, sinus pressure, sinus pain, sneezing and sore throat.   Eyes: Negative for pain, discharge, redness and itching.  Respiratory: Negative for cough, chest tightness, shortness of breath and wheezing.   Cardiovascular: Negative for chest pain, palpitations and leg swelling.  Gastrointestinal: Negative for abdominal distention, abdominal pain, constipation, diarrhea, nausea and vomiting.  Genitourinary: Positive for frequency. Negative for difficulty urinating, dysuria, flank pain, urgency and vaginal discharge.  Musculoskeletal: Negative for gait problem.       Chronic back pain    Skin: Negative for color change, pallor and rash.  Neurological: Negative for dizziness, speech difficulty, weakness, light-headedness and headaches.  Psychiatric/Behavioral: Negative for agitation and confusion. The patient is not nervous/anxious.        Not sleeping well due to frequent urination    Immunization History  Administered Date(s) Administered  . Fluad Quad(high Dose 65+) 10/20/2018  . Influenza Split 10/11/2010, 09/18/2011, 09/29/2013  . Influenza Whole 11/12/2009, 10/08/2017  . Influenza, High Dose Seasonal PF 10/15/2016, 10/10/2018, 10/19/2019  . Influenza-Unspecified 10/04/2014  . Moderna Sars-Covid-2 Vaccination 01/10/2019, 02/07/2019, 11/15/2019  . Pneumococcal Conjugate-13 06/13/2015  . Pneumococcal Polysaccharide-23 02/22/2008  . Td 04/10/2009  . Zoster 10/12/2007  . Zoster Recombinat (Shingrix) 11/06/2017, 01/06/2018, 01/07/2018   Pertinent  Health Maintenance Due  Topic Date Due  . INFLUENZA VACCINE  Completed  . DEXA SCAN  Completed  . PNA vac Low Risk Adult  Completed   Fall Risk  03/12/2020 02/02/2020 11/25/2019 08/18/2019 05/31/2019  Falls in the past year? 0 0 0 1 0  Number falls in past yr: 0 0 0 0 0  Injury with Fall? 0 - - 0 -  Comment - - - - -  Risk for fall due to : - - - - -  Risk for fall due to: Comment - - - - -  Follow up - - - - -   Functional Status Survey:    Vitals:   03/12/20 1325  BP: 116/70  Pulse: 81  Resp: 16  Temp: (!) 97.5 F (36.4 C)  SpO2: 95%  Weight: 125 lb 6.4 oz (56.9 kg)  Height: 5\' 3"  (1.6 m)   Body mass index is 22.21 kg/m. Physical Exam Vitals reviewed.  Constitutional:      General: She is not in acute distress.    Appearance: She is normal weight. She is not ill-appearing.  HENT:     Head: Normocephalic.     Nose: Nose normal. No congestion or rhinorrhea.     Mouth/Throat:     Mouth: Mucous membranes are moist.     Pharynx: Oropharynx is clear. No oropharyngeal exudate or posterior oropharyngeal  erythema.  Eyes:     General: No scleral icterus.       Right eye: No discharge.        Left eye: No discharge.     Extraocular Movements: Extraocular movements intact.     Conjunctiva/sclera: Conjunctivae normal.     Pupils: Pupils are equal, round, and reactive to light.  Cardiovascular:     Rate and Rhythm: Normal rate and regular rhythm.     Pulses: Normal pulses.     Heart  sounds: Murmur heard.  No friction rub. No gallop.   Pulmonary:     Effort: Pulmonary effort is normal. No respiratory distress.     Breath sounds: Normal breath sounds. No wheezing, rhonchi or rales.  Chest:     Chest wall: No tenderness.  Abdominal:     General: Bowel sounds are normal. There is no distension.     Palpations: Abdomen is soft. There is no mass.     Tenderness: There is abdominal tenderness in the right lower quadrant and suprapubic area. There is no right CVA tenderness, left CVA tenderness, guarding or rebound.  Musculoskeletal:        General: No swelling or tenderness. Normal range of motion.     Right lower leg: No edema.     Left lower leg: No edema.  Skin:    General: Skin is warm and dry.     Coloration: Skin is not pale.     Findings: No erythema or rash.  Neurological:     Mental Status: She is alert and oriented to person, place, and time.     Cranial Nerves: No cranial nerve deficit.     Sensory: No sensory deficit.     Motor: No weakness.     Gait: Gait normal.  Psychiatric:        Mood and Affect: Mood normal.        Behavior: Behavior normal.        Thought Content: Thought content normal.        Judgment: Judgment normal.     Labs reviewed: Recent Labs    08/22/19 1016 10/10/19 1434 02/22/20 1000  NA 130* 129* 131*  K 3.2* 4.0 3.3*  CL 96* 95* 98  CO2 24 29 27   GLUCOSE 105* 95 84  BUN 12 15 20   CREATININE 0.78 0.73 0.78  CALCIUM 9.2 9.0 8.4*   Recent Labs    08/22/19 1016 10/10/19 1434 02/22/20 1000  AST 18 25 22   ALT 19 25 31   ALKPHOS 39 43 42   BILITOT 0.7 0.6 0.7  PROT 6.5 6.5 6.4*  ALBUMIN 3.9 3.9 3.8   Recent Labs    10/10/19 1434 11/22/19 1034 02/22/20 1000  WBC 3.3* 4.1 5.6  NEUTROABS 2.2 2.9 2.5  HGB 11.8* 12.0 10.9*  HCT 34.1* 35.2* 32.4*  MCV 120.9* 119.7* 119.6*  PLT 309 371 193   Lab Results  Component Value Date   TSH 4.04 02/15/2019   No results found for: HGBA1C Lab Results  Component Value Date   CHOL 163 02/15/2019   HDL 50 02/15/2019   LDLCALC 94 02/15/2019   TRIG 94 02/15/2019   CHOLHDL 3.3 02/15/2019    Significant Diagnostic Results in last 30 days:  No results found.  Assessment/Plan 1. Symptoms of urinary tract infection Afebrile.Has had increased urine frequency.Unable to give enough urine sample during visit.Brought specimen from home but spilled in her hand bag.slight right lower and suprapubic tenderness on palpation.Non-dis tendered bladder. - Advised to increase water intake to 6-8 glasses daily - Urine specimen cup and a Hat and cleaning supplies given then will drop urine specimen for culture in the morning.will start on antibiotics if febrile or symptoms worsen.   - POC Urinalysis Dipstick indicates positive for large leukocytes,Positive Nitrites ,blood and trace ketones.   2. Decreased hearing of both ears Reports worsening hearing loss thought need ear lavage but no cerumen noted bilaterally.  - Ambulatory referral to ENT  Family/ staff Communication: Reviewed plan  of care with patient and friend verbalized understanding.   Labs/tests ordered: - POC Urinalysis Dipstick  Next Appointment: As needed if symptoms worsen or fail to improve.  Sandrea Hughs, NP

## 2020-03-12 NOTE — Patient Instructions (Addendum)
Increase water intake to at least 6-8 glasses of water daily  - Please bring urine specimen for culture in the morning. - Take Cranberry tablet one by mouth twice daily  - Notify provider if symptoms worsen or running any fever or chills.  Urinary Tract Infection, Adult A urinary tract infection (UTI) is an infection of any part of the urinary tract. The urinary tract includes:  The kidneys.  The ureters.  The bladder.  The urethra. These organs make, store, and get rid of pee (urine) in the body. What are the causes? This infection is caused by germs (bacteria) in your genital area. These germs grow and cause swelling (inflammation) of your urinary tract. What increases the risk? The following factors may make you more likely to develop this condition:  Using a small, thin tube (catheter) to drain pee.  Not being able to control when you pee or poop (incontinence).  Being female. If you are female, these things can increase the risk: ? Using these methods to prevent pregnancy:  A medicine that kills sperm (spermicide).  A device that blocks sperm (diaphragm). ? Having low levels of a female hormone (estrogen). ? Being pregnant. You are more likely to develop this condition if:  You have genes that add to your risk.  You are sexually active.  You take antibiotic medicines.  You have trouble peeing because of: ? A prostate that is bigger than normal, if you are female. ? A blockage in the part of your body that drains pee from the bladder. ? A kidney stone. ? A nerve condition that affects your bladder. ? Not getting enough to drink. ? Not peeing often enough.  You have other conditions, such as: ? Diabetes. ? A weak disease-fighting system (immune system). ? Sickle cell disease. ? Gout. ? Injury of the spine. What are the signs or symptoms? Symptoms of this condition include:  Needing to pee right away.  Peeing small amounts often.  Pain or burning when  peeing.  Blood in the pee.  Pee that smells bad or not like normal.  Trouble peeing.  Pee that is cloudy.  Fluid coming from the vagina, if you are female.  Pain in the belly or lower back. Other symptoms include:  Vomiting.  Not feeling hungry.  Feeling mixed up (confused). This may be the first symptom in older adults.  Being tired and grouchy (irritable).  A fever.  Watery poop (diarrhea). How is this treated?  Taking antibiotic medicine.  Taking other medicines.  Drinking enough water. In some cases, you may need to see a specialist. Follow these instructions at home: Medicines  Take over-the-counter and prescription medicines only as told by your doctor.  If you were prescribed an antibiotic medicine, take it as told by your doctor. Do not stop taking it even if you start to feel better. General instructions  Make sure you: ? Pee until your bladder is empty. ? Do not hold pee for a long time. ? Empty your bladder after sex. ? Wipe from front to back after peeing or pooping if you are a female. Use each tissue one time when you wipe.  Drink enough fluid to keep your pee pale yellow.  Keep all follow-up visits.   Contact a doctor if:  You do not get better after 1-2 days.  Your symptoms go away and then come back. Get help right away if:  You have very bad back pain.  You have very bad pain in  your lower belly.  You have a fever.  You have chills.  You feeling like you will vomit or you vomit. Summary  A urinary tract infection (UTI) is an infection of any part of the urinary tract.  This condition is caused by germs in your genital area.  There are many risk factors for a UTI.  Treatment includes antibiotic medicines.  Drink enough fluid to keep your pee pale yellow. This information is not intended to replace advice given to you by your health care provider. Make sure you discuss any questions you have with your health care  provider. Document Revised: 08/05/2019 Document Reviewed: 08/05/2019 Elsevier Patient Education  [place].

## 2020-03-13 ENCOUNTER — Other Ambulatory Visit: Payer: Medicare PPO

## 2020-03-13 ENCOUNTER — Other Ambulatory Visit: Payer: Self-pay

## 2020-03-13 DIAGNOSIS — R399 Unspecified symptoms and signs involving the genitourinary system: Secondary | ICD-10-CM

## 2020-03-14 ENCOUNTER — Telehealth: Payer: Self-pay

## 2020-03-14 NOTE — Telephone Encounter (Signed)
Incoming call received from patient requesting urine culture results. I informed patient that urine cultures take 48-72 hours to results back (we usually inform patients of this at the time of collection).  Patient states she was not told that by [place]  Patient aware to return call if she has not heard about results by Friday morning, patient verbalized

## 2020-03-16 ENCOUNTER — Other Ambulatory Visit: Payer: Self-pay

## 2020-03-16 LAB — URINE CULTURE
MICRO NUMBER:: 11623088
SPECIMEN QUALITY:: ADEQUATE

## 2020-03-16 MED ORDER — CIPROFLOXACIN HCL 500 MG PO TABS: 500.0000 mg | ORAL_TABLET | Freq: Two times a day (BID) | ORAL | 0 refills | Status: DC

## 2020-03-23 ENCOUNTER — Ambulatory Visit
Admission: RE | Admit: 2020-03-23 | Discharge: 2020-03-23 | Disposition: A | Discharge status: Home / Self Care | Payer: Medicare PPO | Source: Ambulatory Visit | Attending: Hematology | Admitting: Hematology

## 2020-03-23 ENCOUNTER — Other Ambulatory Visit: Payer: Self-pay | Admitting: Hematology

## 2020-03-23 ENCOUNTER — Other Ambulatory Visit: Payer: Self-pay

## 2020-03-23 DIAGNOSIS — C50211 Malignant neoplasm of upper-inner quadrant of right female breast: Secondary | ICD-10-CM

## 2020-03-23 DIAGNOSIS — Z853 Personal history of malignant neoplasm of breast: Secondary | ICD-10-CM | POA: Diagnosis not present

## 2020-03-23 DIAGNOSIS — Z170 Estrogen receptor positive status [ER+]: Secondary | ICD-10-CM

## 2020-03-23 DIAGNOSIS — R922 Inconclusive mammogram: Secondary | ICD-10-CM | POA: Diagnosis not present

## 2020-03-23 DIAGNOSIS — N6321 Unspecified lump in the left breast, upper outer quadrant: Secondary | ICD-10-CM | POA: Diagnosis not present

## 2020-03-23 DIAGNOSIS — R928 Other abnormal and inconclusive findings on diagnostic imaging of breast: Secondary | ICD-10-CM | POA: Diagnosis not present

## 2020-03-27 ENCOUNTER — Ambulatory Visit
Admission: RE | Admit: 2020-03-27 | Discharge: 2020-03-27 | Disposition: A | Discharge status: Home / Self Care | Payer: Medicare PPO | Source: Ambulatory Visit | Attending: Hematology | Admitting: Hematology

## 2020-03-27 ENCOUNTER — Other Ambulatory Visit: Payer: Self-pay

## 2020-03-27 DIAGNOSIS — Z170 Estrogen receptor positive status [ER+]: Secondary | ICD-10-CM

## 2020-03-27 DIAGNOSIS — C50211 Malignant neoplasm of upper-inner quadrant of right female breast: Secondary | ICD-10-CM

## 2020-03-27 DIAGNOSIS — D0512 Intraductal carcinoma in situ of left breast: Secondary | ICD-10-CM | POA: Diagnosis not present

## 2020-03-27 DIAGNOSIS — D0502 Lobular carcinoma in situ of left breast: Secondary | ICD-10-CM | POA: Diagnosis not present

## 2020-03-29 ENCOUNTER — Telehealth: Payer: Self-pay | Admitting: Hematology

## 2020-03-29 NOTE — Telephone Encounter (Signed)
Scheduled per sch msg. Called and spoke with patient. Confirmed change in time of appt

## 2020-03-31 ENCOUNTER — Other Ambulatory Visit: Payer: Self-pay | Admitting: Nurse Practitioner

## 2020-03-31 DIAGNOSIS — G2581 Restless legs syndrome: Secondary | ICD-10-CM

## 2020-04-02 ENCOUNTER — Telehealth: Payer: Self-pay

## 2020-04-02 NOTE — Telephone Encounter (Signed)
Spoke with pt regarding her request to move up her appt. Pt states that she has changed her mind and prefers to keep her 3/31 appt with MD Burr Medico at this time. Pt instructed to call [place] with any more questions/concerns.

## 2020-04-02 NOTE — Telephone Encounter (Signed)
-----   Message from Alla Feeling, NP sent at 04/02/2020  1:38 PM EDT ----- Regarding: RE: Pt's upcoming appt I can see her tomorrow, I have plenty of availability   Lacie ----- Message ----- From: Veverly Fells, RN Sent: 04/02/2020   1:21 PM EDT To: Truitt Merle, MD, [facility] Subject: Pt's upcoming appt                             Hi Dr. Burr Medico,   This pt left a VM stating that she wants to move up her upcoming appt on 04/05/20 due to "anemia concerns." Not sure there would be anywhere to even move her appointment up to.   Please advise. Thanks

## 2020-04-03 ENCOUNTER — Inpatient Hospital Stay: Payer: Medicare PPO | Attending: Hematology

## 2020-04-03 ENCOUNTER — Telehealth: Payer: Self-pay | Admitting: Hematology

## 2020-04-03 ENCOUNTER — Other Ambulatory Visit: Payer: Self-pay

## 2020-04-03 ENCOUNTER — Inpatient Hospital Stay: Payer: Medicare PPO

## 2020-04-03 ENCOUNTER — Inpatient Hospital Stay: Payer: Medicare PPO | Admitting: Hematology

## 2020-04-03 VITALS — BP 129/70 | HR 79 | Temp 97.8°F | Resp 15 | Ht 63.0 in | Wt 122.4 lb

## 2020-04-03 DIAGNOSIS — D473 Essential (hemorrhagic) thrombocythemia: Secondary | ICD-10-CM | POA: Insufficient documentation

## 2020-04-03 DIAGNOSIS — C50211 Malignant neoplasm of upper-inner quadrant of right female breast: Secondary | ICD-10-CM | POA: Insufficient documentation

## 2020-04-03 DIAGNOSIS — Z79899 Other long term (current) drug therapy: Secondary | ICD-10-CM | POA: Insufficient documentation

## 2020-04-03 DIAGNOSIS — D649 Anemia, unspecified: Secondary | ICD-10-CM

## 2020-04-03 DIAGNOSIS — M8580 Other specified disorders of bone density and structure, unspecified site: Secondary | ICD-10-CM | POA: Diagnosis not present

## 2020-04-03 DIAGNOSIS — R5383 Other fatigue: Secondary | ICD-10-CM | POA: Insufficient documentation

## 2020-04-03 DIAGNOSIS — Z170 Estrogen receptor positive status [ER+]: Secondary | ICD-10-CM

## 2020-04-03 DIAGNOSIS — Z791 Long term (current) use of non-steroidal anti-inflammatories (NSAID): Secondary | ICD-10-CM | POA: Insufficient documentation

## 2020-04-03 DIAGNOSIS — D75839 Thrombocytosis, unspecified: Secondary | ICD-10-CM | POA: Insufficient documentation

## 2020-04-03 DIAGNOSIS — D0512 Intraductal carcinoma in situ of left breast: Secondary | ICD-10-CM | POA: Diagnosis not present

## 2020-04-03 DIAGNOSIS — Z923 Personal history of irradiation: Secondary | ICD-10-CM | POA: Diagnosis not present

## 2020-04-03 DIAGNOSIS — R718 Other abnormality of red blood cells: Secondary | ICD-10-CM | POA: Diagnosis not present

## 2020-04-03 DIAGNOSIS — I10 Essential (primary) hypertension: Secondary | ICD-10-CM | POA: Diagnosis not present

## 2020-04-03 DIAGNOSIS — Z7982 Long term (current) use of aspirin: Secondary | ICD-10-CM | POA: Diagnosis not present

## 2020-04-03 LAB — CBC WITH DIFFERENTIAL (CANCER CENTER ONLY)
Abs Immature Granulocytes: 2 10*3/uL — ABNORMAL HIGH (ref 0.00–0.07)
Band Neutrophils: 13 %
Basophils Absolute: 0.8 10*3/uL — ABNORMAL HIGH (ref 0.0–0.1)
Basophils Relative: 10 %
Blasts: 2 %
Eosinophils Absolute: 0.1 10*3/uL (ref 0.0–0.5)
Eosinophils Relative: 1 %
HCT: 24.7 % — ABNORMAL LOW (ref 36.0–46.0)
Hemoglobin: 8 g/dL — ABNORMAL LOW (ref 12.0–15.0)
Lymphocytes Relative: 18 %
Lymphs Abs: 1.4 10*3/uL (ref 0.7–4.0)
MCH: 38.8 pg — ABNORMAL HIGH (ref 26.0–34.0)
MCHC: 32.4 g/dL (ref 30.0–36.0)
MCV: 119.9 fL — ABNORMAL HIGH (ref 80.0–100.0)
Metamyelocytes Relative: 18 %
Monocytes Absolute: 0.5 10*3/uL (ref 0.1–1.0)
Monocytes Relative: 6 %
Myelocytes: 7 %
Neutro Abs: 3 10*3/uL (ref 1.7–7.7)
Neutrophils Relative %: 25 %
Platelet Count: 131 10*3/uL — ABNORMAL LOW (ref 150–400)
RBC: 2.06 MIL/uL — ABNORMAL LOW (ref 3.87–5.11)
RDW: 18.1 % — ABNORMAL HIGH (ref 11.5–15.5)
WBC Count: 7.8 10*3/uL (ref 4.0–10.5)
nRBC: 2.6 % — ABNORMAL HIGH (ref 0.0–0.2)

## 2020-04-03 LAB — RETIC PANEL
Immature Retic Fract: 26.2 % — ABNORMAL HIGH (ref 2.3–15.9)
RBC.: 1.92 MIL/uL — ABNORMAL LOW (ref 3.87–5.11)
Retic Count, Absolute: 59.9 10*3/uL (ref 19.0–186.0)
Retic Ct Pct: 3.1 % (ref 0.4–3.1)
Reticulocyte Hemoglobin: 38.3 pg (ref >27.9)

## 2020-04-03 LAB — VITAMIN B12: Vitamin B-12: 535 pg/mL (ref 180–914)

## 2020-04-03 LAB — LACTATE DEHYDROGENASE: LDH: 493 U/L — ABNORMAL HIGH (ref 98–192)

## 2020-04-03 NOTE — Telephone Encounter (Signed)
Scheduled follow-up appointments per 3/29 los. Patient is aware. ?

## 2020-04-03 NOTE — Progress Notes (Signed)
[place]   [phone] [phone]   Clinic Follow up Note   Patient Care Team: Mast, [name], NP as PCP - General (Internal Medicine) [name], MD as Consulting Physician (Dermatology) [name], MD as Consulting Physician ([place]) Jovita Kussmaul, MD as Consulting Physician (General Surgery) Raye Sorrow, MD as Consulting Physician (Pulmonary Disease) Truitt Merle, MD as Consulting Physician (Hematology) Gery Pray, MD as Consulting Physician (Radiation Oncology) Delice Bison, Charlestine Massed, NP as Nurse Practitioner (Hematology and Oncology) Annia Belt, MD as Consulting Physician (Oncology)  Date of Service:  04/03/2020  CHIEF COMPLAINT: fatigue   SUMMARY OF ONCOLOGIC HISTORY: Oncology History Overview Note  Cancer Staging Malignant neoplasm of upper-inner quadrant of right breast in female, estrogen receptor positive ([place]) Staging form: Breast, AJCC 8th Edition - Clinical stage from 01/07/2017: Stage IA (cT1c, cN0, cM0, G3, ER: Positive, PR: Positive, HER2: Negative) - Signed by Truitt Merle, MD on 01/20/2017  Malignant neoplasm of upper-outer quadrant of left breast in female, estrogen receptor positive ([place]) Staging form: Breast, AJCC 8th Edition - Clinical stage from 01/07/2017: Stage IA (cT1b, cN0, cM0, G2, ER: Positive, PR: Positive, HER2: Negative) - Signed by Truitt Merle, MD on 01/20/2017     Malignant neoplasm of upper-inner quadrant of right breast in female, estrogen receptor positive ([place])  01/01/2017 Mammogram   IMPRESSION: 1. 2.0 cm mass in the 2 o'clock position of the right breast with imaging features highly suspicious for malignancy. 2. 0.7 cm mass in the 2 o'clock position of the left breast with imaging features highly suspicious for malignancy. 3. No abnormal appearing axillary lymph nodes on either side.   01/07/2017 Receptors her2   Right Breast: Prognostic indicators significant for: ER, 95%  positive and PR, 50% positive, both with strong staining intensity. Proliferation marker Ki67 at 30. HER2 negative. RATIO OF HER2/CEP17 SIGNALS 0.81 AVERAGE HER2 COPY NUMBER PER CELL 1.70  Left Breast: Prognostic indicators significant for: ER, 100% positive and PR, 100% positive, both with strong staining intensity. Proliferation marker Ki67 at 10. HER2 negative. RATIO OF HER2/CEP17 SIGNALS 1.75 AVERAGE HER2 COPY NUMBER PER CELL 3.15   01/07/2017 Initial Biopsy   Diagnosis 1. Breast, right, needle core biopsy, 2:00 o'clock - INVASIVE DUCTAL CARCINOMA, SEE COMMENT. 2. Breast, left, needle core biopsy, 2:00 o'clock - INVASIVE MAMMARY CARCINOMA, SEE COMMENT.    01/20/2017 Initial Diagnosis   Malignant neoplasm of upper-inner quadrant of right breast in female, estrogen receptor positive ([place])   01/2017 - 03/2018 Anti-estrogen oral therapy   Anastrozole 1 mg daily started on 01/2017, stopped around 01/2018 due to diffuse joint pain. She switched to Exemestane on 03/26/2018 but stopped after 1 week due to gluteal pain.    02/20/2017 Surgery   RIGHT BREAST LUMPECTOMY (Right) LEFT BREAST LUMPECTOMY WITH RADIOACTIVE SEED LOCALIZATION (Left)  Per Dr. Marlou Starks    02/20/2017 Pathology Results    Diagnosis 1. Breast, lumpectomy, Left w/ seed - INVASIVE DUCTAL CARCINOMA, GRADE I/III, SPANNING 1.1 CM. - THE SURGICAL RESECTION MARGINS ARE NEGATIVE FOR CARCINOMA. - SEE ONCOLOGY TABLE BELOW. 2. Breast, lumpectomy, Right - INVASIVE DUCTAL CARCINOMA, GRADE II/III, SPANNING 2.1 CM. - DUCTAL CARCINOMA IN SITU, INTERMEDIATE GRADE. - PERINEURAL INVASION IS IDENTIFIED. - THE SURGICAL RESECTION MARGINS ARE NEGATIVE FOR CARCINOMA. - SEE ONCOLOGY TABLE BELOW. 3. Breast, excision, Right inferior - LOBULAR NEOPLASIA (ATYPICAL LOBULAR HYPERPLASIA). - FAT NECROSIS. - SEE COMMENT.  Microscopic Comment 1. BREAST, INVASIVE TUMOR (LEFT) Procedure: Seed localized lumpectomy Laterality: Left Tumor Size: 1.1  cm  (gross measurement). Histologic Type: Ductal Grade: I Tubular Differentiation: 2 Nuclear Pleomorphism: 1 Mitotic Count: 1 Ductal Carcinoma in Situ (DCIS): Not identified Extent of Tumor: Confined to breast parenchyma. Margins: Greater than 0.2 cm to all margins. Regional Lymph Nodes: None examined. Breast Prognostic Profile: Case [id number] Estrogen Receptor: 100%, strong Progesterone Receptor: 100%, strong Her2: No amplification was detected. The ratio was 0.81 Ki-67: 10% Best tumor block for sendout testing: 1B Pathologic Stage Classification (pTNM, AJCC 8th Edition): Primary Tumor (pT): pT1c Regional Lymph Nodes (pN): pNX Distant Metastases (pM): pMX  2. BREAST, INVASIVE TUMOR (RIGHT) Procedure: Lumpectomy Laterality: Right Tumor Size: 2.1 cm (gross measurement) Histologic Type: Ductal Grade: II Tubular Differentiation: 3 Nuclear Pleomorphism: 2 Mitotic Count: 1 Ductal Carcinoma in Situ (DCIS): Present, intermediate grade Extent of Tumor: Confined to breast parenchyma. Margins: Greater than 0.2 cm to all margins Regional Lymph Nodes: None examined. Breast Prognostic Profile: Case [id number] Estrogen Receptor: 95%, strong Progesterone Receptor: 50%, strong Her2: No amplification was detected. The ratio was 0.81 Ki-67: 30% Best tumor block for sendout testing: 2A Pathologic Stage Classification (pTNM, AJCC 8th Edition): Primary Tumor (pT): pT2 Regional Lymph Nodes (pN): pNX Distant Metastases (pM): pMX 3. The surgical resection margin(s) of the specimen were inked and microscopically evaluated.   03/31/2017 - 05/11/2017 Radiation Therapy   1. Right Breast / 50 Gy in 25 fractions 2. Right Axilla / 45 Gy in 25 fractions 3. Right Breast Boost / 10 Gy in 5 fractions   Malignant neoplasm of upper-outer quadrant of left breast in female, estrogen receptor positive ([place])  01/20/2017 Initial Diagnosis   Malignant neoplasm of upper-outer quadrant of left breast in  female, estrogen receptor positive ([place])   02/20/2017 Surgery   RIGHT BREAST LUMPECTOMY (Right) LEFT BREAST LUMPECTOMY WITH RADIOACTIVE SEED LOCALIZATION (Left)  Per Dr. Marlou Starks     02/12/2018 Mammogram   IMPRESSION: No mammographic evidence of breast malignancy       CURRENT THERAPY:  -Breast Cancer Surveillance -Hydrea 1033m daily, dose increased in Jan 2020. Then increased to 10083mdaily in morning with extra 50012mn Mondays and Thursdays. Reduced to 1000m32mce daily everyday since 10/2019. Reduced Hydrea to 1000mg80me daily except 500mg 39mondays and Thursdays starting 02/22/20.  -Zometa every 6 months for 2 years8/14/20-2/16/22  INTERVAL HISTORY:  Chariah RDelanda Bulluckre for an urgent visit for her fatigue and anemia. She has been very fatigued for a few weeks, no chest pain, but has dyspnea on exertion.  She denies hematochezia, melena, or any other signs of bleeding.  No abdominal pain, bloating, nausea, fever or chills.  She underwent left breast biopsy last week, which showed high-grade DCIS.  She was scheduled to see breast surgeon Dr. Toast Titus Mould, but she canceled and came to see me for her fatigue.  All other systems were reviewed with the patient and are negative.  MEDICAL HISTORY:  Past Medical History:  Diagnosis Date  . Bladder cystocele 05/21/2010  . Complication of anesthesia    deglutition in history  . Deglutition disorder 08/17/2012   Single episode of choking/aspiration; preceded by trouble swallowing saliva (thin liquids).  For SLP evaluation. Aug 17, 2012.    . DiseMarland Kitchense of blood and blood forming organ 01/05/2007   Qualifier: Diagnosis of  By: FIELDSOneida AlarARL    . Diverticulitis   . DIVERTICULOSIS OF COLON 03/05/2006   Qualifier: Diagnosis of  By: Odell,Samara SnideDIZZINESS 01/18/2010   Qualifier: Diagnosis of  By: Lindell Noe MD, Jeneen Rinks    . Essential hypertension, benign 02/22/2008   Qualifier: Diagnosis of  By: Lindell Noe MD, Jeneen Rinks     . Essential  thrombocythemia ([place]) 01/31/11  . Family history of Alzheimer's disease 11/11/2013   Patient with family history of Alzheimers disease. She herself is very highly functional.  Plans for future visit with MMSE as primary function of the visit.    [name] GERD (gastroesophageal reflux disease) 08/16/2010  . HALLUX VALGUS, ACQUIRED 04/10/2009   Qualifier: Diagnosis of  By: [name] CMA,, [place]    . Hypertension   . HYPERTRIGLYCERIDEMIA 01/05/2007   Qualifier: Diagnosis of  By: Oneida Alar MD, KARL    . Insomnia 02/10/2014  . Left-sided chest wall pain 12/19/2011    L sided chest wall pain follows a dermatomal distribution and raises the possibility of thoracic radiculopathic pain, which would be expected to improve with gabapentin as well. We discussed this at length and she will call to make me aware if this does not get better. Rib belt/girdle in the meantime; may consider rib films or CXR if not improving.     . Metatarsalgia of right foot 11/28/2013   Referral to [facility]   . Osteopenia   . Personal history of radiation therapy    03/2017 right breast  . Restless leg syndrome 06/09/2011  . Restless legs syndrome (RLS)   . URINARY INCONTINENCE, URGE, MILD 11/27/2009   Qualifier: Diagnosis of  By: Zebedee Iba NP, Manuela Schwartz    . Vitamin D deficiency 08/16/2010    SURGICAL HISTORY: Past Surgical History:  Procedure Laterality Date  . BIOPSY BREAST Left 1998   Dr. Rebekah Chesterfield  . BREAST EXCISIONAL BIOPSY    . BREAST LUMPECTOMY Right 02/20/2017   Procedure: RIGHT BREAST LUMPECTOMY;  Surgeon: Jovita Kussmaul, MD;  Location: [facility];  Service: General;  Laterality: Right;  . BREAST LUMPECTOMY Left 02/20/2017   left lumpectomy  . BREAST LUMPECTOMY WITH RADIOACTIVE SEED LOCALIZATION Left 02/20/2017   Procedure: LEFT BREAST LUMPECTOMY WITH RADIOACTIVE SEED LOCALIZATION;  Surgeon: Jovita Kussmaul, MD;  Location: [facility];  Service: General;  Laterality: Left;  . CATARACT EXTRACTION  2010   Dr. [name]  .  CHOLECYSTECTOMY  1992  . COLONOSCOPY  2008  . MOHS SURGERY  2012   on face [facility] [place]  . NAILBED REPAIR  12/05/2010   Procedure: NAILBED REPAIR;  Surgeon: Cammie Sickle., MD;  Location: [facility];  Service: Orthopedics;  Laterality: Right;  full thickness nail biopsy right hallux  . torn tenden  2012   hand Dr. Orie Rout    I have reviewed the social history and family history with the patient and they are unchanged from previous note.  ALLERGIES:  is allergic to doxycycline, macrodantin [nitrofurantoin], and sulfamethoxazole-trimethoprim.  MEDICATIONS:  Current Outpatient Medications  Medication Sig Dispense Refill  . gabapentin (NEURONTIN) 300 MG capsule TAKE 1 CAPSULE BY MOUTH EVERY MORNING AND TAKE 3 CAPSULES BY MOUTH EVERY EVENING 360 capsule 1  . aspirin 81 MG tablet Take 81 mg by mouth daily.    . calcium carbonate (OSCAL) 1500 (600 Ca) MG TABS tablet Take 600 mg of elemental calcium by mouth in the morning and at bedtime.     . cholecalciferol (VITAMIN D) 1000 UNITS tablet Take 2,000 Units by mouth daily.     . ciprofloxacin (CIPRO) 500 MG tablet Take 1 tablet (500 mg total) by mouth 2 (two) times daily. 14 tablet 0  .  Cranberry 475 MG CAPS Take 1 capsule (475 mg total) by mouth 2 (two) times daily. 60 capsule 3  . GLUCOSAMINE-CHONDROITIN PO Take by mouth. 1500-1200  Take two tablet daily    . hydrochlorothiazide (HYDRODIURIL) 25 MG tablet TAKE 1 TABLET BY MOUTH EVERY DAY 90 tablet 1  . hydroxyurea (HYDREA) 500 MG capsule Take 500 mg by mouth daily. May take with food to minimize GI side effects. Take 1 tablet (548m) on Monday and Thursday, take 2 talbets (10044m on Sunday, Tuesday, Wednesday, Friday, and Saturday    . ibuprofen (ADVIL,MOTRIN) 200 MG tablet Take 400 mg by mouth as needed.     . Magnesium 500 MG TABS Take 1 tablet by mouth daily.    . metoprolol tartrate (LOPRESSOR) 25 MG tablet TAKE 1/2 TABLET BY MOUTH TWICE A DAY 90  tablet 1  . Omega-3 Fatty Acids (FISH OIL PO) Take by mouth.    . Marland Kitchenmeprazole (PRILOSEC) 20 MG capsule TAKE 1 CAPSULE BY MOUTH EVERY DAY 90 capsule 1  . polyethylene glycol (MIRALAX / GLYCOLAX) packet Take 17 g by mouth daily.    . potassium chloride SA (KLOR-CON) 20 MEQ tablet Take 20 mEq by mouth daily.    . Marland KitchenyridOXINE (VITAMIN B-6) 100 MG tablet Take 100 mg by mouth daily.    . solifenacin (VESICARE) 5 MG tablet Take 5 mg by mouth daily.     No current facility-administered medications for this visit.    PHYSICAL EXAMINATION: ECOG PERFORMANCE STATUS: 1 - Symptomatic but completely ambulatory  Vitals:   04/03/20 1343  BP: 129/70  Pulse: 79  Resp: 15  Temp: 97.8 F (36.6 C)  SpO2: 97%   Filed Weights   04/03/20 1343  Weight: 122 lb 6.4 oz (55.5 kg)    GENERAL:alert, no distress and comfortable SKIN: skin color, texture, turgor are normal, no rashes or significant lesions EYES: normal, Conjunctiva are pink and non-injected, sclera clear  NECK: supple, thyroid normal size, non-tender, without nodularity LYMPH:  no palpable lymphadenopathy in the cervical, axillary  LUNGS: clear to auscultation and percussion with normal breathing effort HEART: regular rate & rhythm and no murmurs (+) Mild edema of right knee  ABDOMEN:abdomen soft, non-tender and normal bowel sounds Musculoskeletal:no cyanosis of digits and no clubbing  NEURO: alert & oriented x 3 with fluent speech, no focal motor/sensory deficits BREAST: S/p b/l lumpectomy: Surgical incisions healed well with mild scar tissue in right breast incision (+) Mild tenderness of right breast incision. No palpable mass, nodules or adenopathy bilaterally. Breast exam benign.   LABORATORY DATA:  I have reviewed the data as listed CBC Latest Ref Rng & Units 04/03/2020 02/22/2020 11/22/2019  WBC 4.0 - 10.5 K/uL 7.8 5.6 4.1  Hemoglobin 12.0 - 15.0 g/dL 8.0(L) 10.9(L) 12.0  Hematocrit 36.0 - 46.0 % 24.7(L) 32.4(L) 35.2(L)  Platelets 150  - 400 K/uL 131(L) 193 371     CMP Latest Ref Rng & Units 02/22/2020 10/10/2019 08/22/2019  Glucose 70 - 99 mg/dL 84 95 105(H)  BUN 8 - 23 mg/dL 20 15 12   Creatinine 0.44 - 1.00 mg/dL 0.78 0.73 0.78  Sodium 135 - 145 mmol/L 131(L) 129(L) 130(L)  Potassium 3.5 - 5.1 mmol/L 3.3(L) 4.0 3.2(L)  Chloride 98 - 111 mmol/L 98 95(L) 96(L)  CO2 22 - 32 mmol/L 27 29 24   Calcium 8.9 - 10.3 mg/dL 8.4(L) 9.0 9.2  Total Protein 6.5 - 8.1 g/dL 6.4(L) 6.5 6.5  Total Bilirubin 0.3 - 1.2 mg/dL 0.7 0.6 0.7  Alkaline Phos 38 - 126 U/L 42 43 39  AST 15 - 41 U/L 22 25 18   ALT 0 - 44 U/L 31 25 19       RADIOGRAPHIC STUDIES: I have personally reviewed the radiological images as listed and agreed with the findings in the report. No results found.   ASSESSMENT & PLAN:  Jill Myers is a 87 y.o. female with   1. Worsening new anemia  -She was noticed to have mild anemia, about 6 weeks ago -Repeated CBC showed WBC 7.8, hemoglobin 8.0, hematocrit 24.7, MCV 119.9 and platelet 131K -We will obtain iron studies, folate, B12, reticular panel, haptoglobin and LDH today to rule out nutritional anemia and hemolytic anemia -Her microcytosis is likely related to Hydrea.  I will stop Hydrea due to her worsening anemia -Peripheral blood smear showed circulating blasts, will repeat next week, if persists, will obtain bone marrow biopsy. -We will also check stool OB -Due to her long history of ET, leukemia transformation needs to be ruled out -Due to history of breast cancer, bone marrow metastasis may need to be ruled out also.  2. Left breast DCIS, high grade -Newly diagnosed on March 27, 2020 -ER/PR still pending -I discussed that the standard care is lumpectomy. -Due to her ongoing severe anemia, will hold surgery for now.  3.  Bilateral breast cancer, invasive ductal carcinoma, right upper inner quadrant, pT2N0M0, stage IA, ER+/PR+/HER2-, G3, left upper-outer quadrant,pT1cN0M0, stage IA, ER+/PR+/HER2-,  G2 -She was diagnosed on 01/20/2017, and is s/pbilateral lumpectomy with negative margins,followed by adjuvant right breastand axillaryradiation. -She tried anastrozole and Exemestane from 01/2017-01/2018 but stopped due to MSK pain. Given her very early stage breast cancer and her age,poor tolerance, will hold adjuvant antiestrogen therapy. -Continue surveillance  4.Essential thrombocythemia -OnHydrea. Dose is adjusted as needed.  -She is on ASA to reduce risk of thrombosis -Goal to keep plt<500k. She has been on Hydrea 1076m daily everyday since 10/2019. She tolerates well  -Labs reviewed today, Hg 10.9, plt 193K, Sodium 131, K 3.3, Ca 8.4, protein 6.4. Given increasing anemia, I reduced Hydrea to 10076mdaily except 50065mn Mondays and Thursdays in 02/2020.     PLAN: -Anemia work-up today -1 unit blood transfusion in the next few days -Stop hydrea -repeat CBC weekly and f/u in 2 weeks -may need bone marrow biopsy soon  -We will inform Dr. TotMarlou Starksout put her breast surgery on hold for now   No problem-specific Assessment & Plan notes found for this encounter.   No orders of the defined types were placed in this encounter.  All questions were answered. The patient knows to call the clinic with any problems, questions or concerns. No barriers to learning was detected. The total time spent in the appointment was 30 minutes.     YanTruitt MerleD 04/03/2020   I, AmoJoslyn Devonm acting as scribe for YanTruitt MerleD.   I have reviewed the above documentation for accuracy and completeness, and I agree with the above.

## 2020-04-04 LAB — FOLATE RBC
Folate, Hemolysate: 279 ng/mL
Folate, RBC: 1218 ng/mL (ref >498)
Hematocrit: 22.9 % — ABNORMAL LOW (ref 34.0–46.6)

## 2020-04-04 LAB — IRON AND TIBC
Iron: 181 ug/dL — ABNORMAL HIGH (ref 41–142)
Saturation Ratios: 69 % — ABNORMAL HIGH (ref 21–57)
TIBC: 264 ug/dL (ref 236–444)
UIBC: 83 ug/dL — ABNORMAL LOW (ref 120–384)

## 2020-04-04 LAB — HAPTOGLOBIN: Haptoglobin: 45 mg/dL (ref 41–333)

## 2020-04-04 LAB — FERRITIN: Ferritin: 812 ng/mL — ABNORMAL HIGH (ref 11–307)

## 2020-04-04 LAB — PATHOLOGIST SMEAR REVIEW

## 2020-04-05 ENCOUNTER — Encounter: Payer: Self-pay | Admitting: Hematology

## 2020-04-05 ENCOUNTER — Other Ambulatory Visit: Payer: Medicare PPO

## 2020-04-05 ENCOUNTER — Encounter: Payer: Self-pay | Admitting: Nurse Practitioner

## 2020-04-05 ENCOUNTER — Ambulatory Visit: Payer: Medicare PPO | Admitting: Hematology

## 2020-04-05 ENCOUNTER — Non-Acute Institutional Stay: Payer: Medicare PPO | Admitting: Nurse Practitioner

## 2020-04-05 ENCOUNTER — Other Ambulatory Visit: Payer: Self-pay | Admitting: Hematology

## 2020-04-05 ENCOUNTER — Other Ambulatory Visit: Payer: Self-pay

## 2020-04-05 DIAGNOSIS — M85851 Other specified disorders of bone density and structure, right thigh: Secondary | ICD-10-CM | POA: Diagnosis not present

## 2020-04-05 DIAGNOSIS — G2581 Restless legs syndrome: Secondary | ICD-10-CM | POA: Diagnosis not present

## 2020-04-05 DIAGNOSIS — M8949 Other hypertrophic osteoarthropathy, multiple sites: Secondary | ICD-10-CM

## 2020-04-05 DIAGNOSIS — K5909 Other constipation: Secondary | ICD-10-CM | POA: Diagnosis not present

## 2020-04-05 DIAGNOSIS — G5763 Lesion of plantar nerve, bilateral lower limbs: Secondary | ICD-10-CM

## 2020-04-05 DIAGNOSIS — Z170 Estrogen receptor positive status [ER+]: Secondary | ICD-10-CM

## 2020-04-05 DIAGNOSIS — C50211 Malignant neoplasm of upper-inner quadrant of right female breast: Secondary | ICD-10-CM

## 2020-04-05 DIAGNOSIS — M549 Dorsalgia, unspecified: Secondary | ICD-10-CM

## 2020-04-05 DIAGNOSIS — K219 Gastro-esophageal reflux disease without esophagitis: Secondary | ICD-10-CM

## 2020-04-05 DIAGNOSIS — D649 Anemia, unspecified: Secondary | ICD-10-CM

## 2020-04-05 DIAGNOSIS — M79604 Pain in right leg: Secondary | ICD-10-CM

## 2020-04-05 DIAGNOSIS — I10 Essential (primary) hypertension: Secondary | ICD-10-CM

## 2020-04-05 DIAGNOSIS — E871 Hypo-osmolality and hyponatremia: Secondary | ICD-10-CM | POA: Diagnosis not present

## 2020-04-05 DIAGNOSIS — D759 Disease of blood and blood-forming organs, unspecified: Secondary | ICD-10-CM

## 2020-04-05 DIAGNOSIS — D473 Essential (hemorrhagic) thrombocythemia: Secondary | ICD-10-CM

## 2020-04-05 DIAGNOSIS — M159 Polyosteoarthritis, unspecified: Secondary | ICD-10-CM

## 2020-04-05 DIAGNOSIS — M25461 Effusion, right knee: Secondary | ICD-10-CM

## 2020-04-05 DIAGNOSIS — N3941 Urge incontinence: Secondary | ICD-10-CM

## 2020-04-05 LAB — ABO/RH: ABO/RH(D): B POS

## 2020-04-05 LAB — PREPARE RBC (CROSSMATCH)

## 2020-04-05 NOTE — Assessment & Plan Note (Signed)
stable, on Vesicare 10mg  qd.

## 2020-04-05 NOTE — Assessment & Plan Note (Addendum)
stable, off Hydrea, plt 193 02/22/20

## 2020-04-05 NOTE — Addendum Note (Signed)
Addended by: Truitt Merle on: 04/05/2020 08:48 AM   Modules accepted: Orders

## 2020-04-05 NOTE — Assessment & Plan Note (Signed)
radiculopathy R, Ortho 04/28/19,Ibuprofen, Gabapentin,Vit B6

## 2020-04-05 NOTE — Progress Notes (Signed)
 Location:   clinic [facility]   Place of Service:  Clinic (12) Provider: ManXie Mast NP  Code Status: DNR Goals of Care: [place] 03/12/2020  Does Patient Have a Medical Advance Directive? Yes  Type of Advance Directive Healthcare Power of Attorney;Living will  Does patient want to make changes to medical advance directive? No - Patient declined  Copy of Healthcare Power of Attorney in Chart? Yes - validated most recent copy scanned in chart (See row information)  Would patient like information on creating a medical advance directive? -     Chief Complaint  Patient presents with  . Medical Management of Chronic Issues    Patient returns to the clinic for follow up. Right knee fluid, hematologist for anemia and fatigue.     HPI: Patient is a 87 y.o. female seen today for medical management of chronic diseases.    Prepatellar bursitis of the right knee, chronic. Takes prn Ibuprofen.   The R+L ball part feet pain when walking, not new, Hx of Metatarsalgia, Morton's neuroma             Bilateral breast cancer, s/p lumpectomy, radiation, off Adjuvant, f/u oncology.  HTN, Metoprolol 12.5mg bid since 05/24/19, HCTZ 25mg qd, Bun/creat 10/0.78 02/22/20 Hx of RLS, stable, on Gabapentin 300mg qam 900mg qpm.              Essential thrombocythemia stable, off hydroxyurea, , plt 193 02/22/20  Anemia, Hgb 8.0 04/03/20, f/u oncology.  GERD, stable, on Omeprazole.     Constipation, stable, on MiraLax qd, Colace qd.  Urinary frequency, stable, on Vesicare 10mg qd.  Lower back pain, radiculopathy R, Ortho 04/28/19,Ibuprofen, Gabapentin,Vit B6 Hyponatremia, Na 131 02/22/20             Osteopenia, takes Zometa.   Past Medical History:  Diagnosis Date  . Bladder cystocele 05/21/2010  . Complication of anesthesia    deglutition in history  . Deglutition disorder 08/17/2012   Single episode of  choking/aspiration; preceded by trouble swallowing saliva (thin liquids).  For SLP evaluation. Aug 17, 2012.    . Disease of blood and blood forming organ 01/05/2007   Qualifier: Diagnosis of  By: FIELDS MD, KARL    . Diverticulitis   . DIVERTICULOSIS OF COLON 03/05/2006   Qualifier: Diagnosis of  By: Odell, Erin    . DIZZINESS 01/18/2010   Qualifier: Diagnosis of  By: Breen MD, James    . Essential hypertension, benign 02/22/2008   Qualifier: Diagnosis of  By: Breen MD, James     . Essential thrombocythemia (HCC) 01/31/11  . Family history of Alzheimer's disease 11/11/2013   Patient with family history of Alzheimers disease. She herself is very highly functional.  Plans for future visit with MMSE as primary function of the visit.    . GERD (gastroesophageal reflux disease) 08/16/2010  . HALLUX VALGUS, ACQUIRED 04/10/2009   Qualifier: Diagnosis of  By: Slade CMA,, Thekla    . Hypertension   . HYPERTRIGLYCERIDEMIA 01/05/2007   Qualifier: Diagnosis of  By: FIELDS MD, KARL    . Insomnia 02/10/2014  . Left-sided chest wall pain 12/19/2011    L sided chest wall pain follows a dermatomal distribution and raises the possibility of thoracic radiculopathic pain, which would be expected to improve with gabapentin as well. We discussed this at length and she will call to make me aware if this does not get better. Rib belt/girdle in the meantime; may consider rib films or CXR if not   improving.     . Metatarsalgia of right foot 11/28/2013   Referral to [facility]   . Osteopenia   . Personal history of radiation therapy    03/2017 right breast  . Restless leg syndrome 06/09/2011  . Restless legs syndrome (RLS)   . URINARY INCONTINENCE, URGE, MILD 11/27/2009   Qualifier: Diagnosis of  By: Zebedee Iba NP, Manuela Schwartz    . Vitamin D deficiency 08/16/2010    Past Surgical History:  Procedure Laterality Date  . BIOPSY BREAST Left 1998   Dr. Rebekah Chesterfield  . BREAST EXCISIONAL BIOPSY    . BREAST LUMPECTOMY Right 02/20/2017   Procedure:  RIGHT BREAST LUMPECTOMY;  Surgeon: Jovita Kussmaul, MD;  Location: [place];  Service: General;  Laterality: Right;  . BREAST LUMPECTOMY Left 02/20/2017   left lumpectomy  . BREAST LUMPECTOMY WITH RADIOACTIVE SEED LOCALIZATION Left 02/20/2017   Procedure: LEFT BREAST LUMPECTOMY WITH RADIOACTIVE SEED LOCALIZATION;  Surgeon: Jovita Kussmaul, MD;  Location: [place];  Service: General;  Laterality: Left;  . CATARACT EXTRACTION  2010   Dr. [name]  . CHOLECYSTECTOMY  1992  . COLONOSCOPY  2008  . MOHS SURGERY  2012   on face [facility] [place]  . NAILBED REPAIR  12/05/2010   Procedure: NAILBED REPAIR;  Surgeon: Cammie Sickle., MD;  Location: [place];  Service: Orthopedics;  Laterality: Right;  full thickness nail biopsy right hallux  . torn tenden  2012   hand Dr. Orie Rout    Allergies  Allergen Reactions  . Doxycycline Nausea And Vomiting  . Macrodantin [Nitrofurantoin] Nausea And Vomiting  . Sulfamethoxazole-Trimethoprim Rash    REACTION: Rash after completing course of Septra    Allergies as of 04/05/2020      Reactions   Doxycycline Nausea And Vomiting   Macrodantin [nitrofurantoin] Nausea And Vomiting   Sulfamethoxazole-trimethoprim Rash   REACTION: Rash after completing course of Septra      Medication List       Accurate as of April 05, 2020 11:59 PM. If you have any questions, ask your nurse or doctor.        STOP taking these medications   ciprofloxacin 500 MG tablet Commonly known as: Cipro Stopped by: Stepfon Rawles X Annamary Buschman, NP   hydroxyurea 500 MG capsule Commonly known as: HYDREA Stopped by: Shashwat Cleary X Rosmery Duggin, NP     TAKE these medications   aspirin 81 MG tablet Take 81 mg by mouth daily.   calcium carbonate 1500 (600 Ca) MG Tabs tablet Commonly known as: OSCAL Take 600 mg of elemental calcium by mouth in the morning and at bedtime.   cholecalciferol 1000 units tablet Commonly known as: VITAMIN D Take  2,000 Units by mouth daily.   Cranberry 475 MG Caps Take 1 capsule (475 mg total) by mouth 2 (two) times daily.   FISH OIL PO Take by mouth.   gabapentin 300 MG capsule Commonly known as: NEURONTIN TAKE 1 CAPSULE BY MOUTH EVERY MORNING AND TAKE 3 CAPSULES BY MOUTH EVERY EVENING   GLUCOSAMINE-CHONDROITIN PO Take by mouth. 1500-1200  Take two tablet daily   hydrochlorothiazide 25 MG tablet Commonly known as: HYDRODIURIL TAKE 1 TABLET BY MOUTH EVERY DAY   ibuprofen 200 MG tablet Commonly known as: ADVIL Take 400 mg by mouth as needed.   Magnesium 500 MG Tabs Take 1 tablet by mouth daily.   metoprolol tartrate 25 MG tablet Commonly known as: LOPRESSOR TAKE 1/2 TABLET BY MOUTH TWICE A DAY  omeprazole 20 MG capsule Commonly known as: PRILOSEC TAKE 1 CAPSULE BY MOUTH EVERY DAY   polyethylene glycol 17 g packet Commonly known as: MIRALAX / GLYCOLAX Take 17 g by mouth daily.   potassium chloride SA 20 MEQ tablet Commonly known as: KLOR-CON Take 20 mEq by mouth daily.   pyridOXINE 100 MG tablet Commonly known as: VITAMIN B-6 Take 100 mg by mouth daily.   solifenacin 5 MG tablet Commonly known as: VESICARE Take 5 mg by mouth daily.       Review of Systems:  Review of Systems  Constitutional: Positive for appetite change. Negative for fatigue and fever.       Decreased appetite, declined meds.   HENT: Positive for hearing loss and postnasal drip. Negative for congestion and voice change.   Eyes: Negative for visual disturbance.  Respiratory: Positive for cough. Negative for shortness of breath.        Chronic cough  Cardiovascular: Positive for leg swelling. Negative for chest pain and palpitations.  Gastrointestinal: Negative for abdominal pain and constipation.  Genitourinary: Positive for frequency. Negative for dysuria and urgency.  Musculoskeletal: Positive for arthralgias, back pain, gait problem and joint swelling.       R+L foot MTJ pain, top of feet pain   chronic. Right lower back, right hip, right leg pain is chronic, positional improved. Right knee pain/prepetellar effusion-s/p aspiration x2  Skin: Negative for color change.  Neurological: Negative for speech difficulty, weakness and light-headedness.  Psychiatric/Behavioral: Negative for agitation, behavioral problems and sleep disturbance. The patient is not nervous/anxious.     Health Maintenance  Topic Date Due  . TETANUS/TDAP  04/11/2019  . COVID-19 Vaccine (4 - Booster for Moderna series) 05/14/2020  . INFLUENZA VACCINE  08/06/2020  . DEXA SCAN  Completed  . PNA vac Low Risk Adult  Completed  . HPV VACCINES  Aged Out    Physical Exam: Vitals:   04/05/20 1441  BP: 110/60  Pulse: 78  Temp: 97.8 F (36.6 C)  SpO2: 95%  Weight: 122 lb 3.2 oz (55.4 kg)  Height: 5' 3" (1.6 m)   Body mass index is 21.65 kg/m. Physical Exam Vitals and nursing note reviewed.  Constitutional:      Appearance: Normal appearance.  HENT:     Head: Normocephalic and atraumatic.     Mouth/Throat:     Mouth: Mucous membranes are moist.  Eyes:     Extraocular Movements: Extraocular movements intact.     Conjunctiva/sclera: Conjunctivae normal.     Pupils: Pupils are equal, round, and reactive to light.  Cardiovascular:     Rate and Rhythm: Normal rate and regular rhythm.     Heart sounds: Murmur heard.    Pulmonary:     Breath sounds: No rales.  Abdominal:     General: Bowel sounds are normal.     Palpations: Abdomen is soft.     Tenderness: There is no abdominal tenderness.  Musculoskeletal:        General: Swelling and tenderness present.     Cervical back: Normal range of motion and neck supple.     Right lower leg: Edema present.     Left lower leg: Edema present.     Comments: Very mild edema BLE. Lower back pain, radiculopathy R, Ortho 04/28/19. Right knee pain/prepetellar effusion-s/p aspiration x2   Skin:    General: Skin is warm and dry.     Coloration: Skin is pale.   Neurological:     General: No   focal deficit present.     Mental Status: She is alert and oriented to person, place, and time. Mental status is at baseline.     Motor: No weakness.     Coordination: Coordination normal.     Gait: Gait abnormal.  Psychiatric:        Mood and Affect: Mood normal.        Behavior: Behavior normal.        Thought Content: Thought content normal.        Judgment: Judgment normal.     Labs reviewed: Basic Metabolic Panel: Recent Labs    08/22/19 1016 10/10/19 1434 02/22/20 1000  NA 130* 129* 131*  K 3.2* 4.0 3.3*  CL 96* 95* 98  CO2 _0 GLUCOSE 105* 95 84  BUN _1 CREATININE 0.78 0.73 0.78  CALCIUM 9.2 9.0 8.4*   Liver Function Tests: Recent Labs    08/22/19 1016 10/10/19 1434 02/22/20 1000  AST _2 ALT _3 ALKPHOS 39 43 42  BILITOT 0.7 0.6 0.7  PROT 6.5 6.5 6.4*  ALBUMIN 3.9 3.9 3.8   No results for input(s): LIPASE, AMYLASE in the last 8760 hours. No results for input(s): AMMONIA in the last 8760 hours. CBC: Recent Labs    11/22/19 1034 02/22/20 1000 04/03/20 1315 04/03/20 1508  WBC 4.1 5.6 7.8  --   NEUTROABS 2.9 2.5 3.0  --   HGB 12.0 10.9* 8.0*  --   HCT 35.2* 32.4* 24.7* 22.9*  MCV 119.7* 119.6* 119.9*  --   PLT 371 193 131*  --    Lipid Panel: No results for input(s): CHOL, HDL, LDLCALC, TRIG, CHOLHDL, LDLDIRECT in the last 8760 hours. No results found for: HGBA1C  Procedures since last visit: US BREAST LTD UNI LEFT INC AXILLA  Result Date: 03/23/2020 CLINICAL DATA:  Patient presents for bilateral diagnostic exam due to history of bilateral malignant lumpectomy 2019. EXAM: DIGITAL DIAGNOSTIC BILATERAL MAMMOGRAM WITH TOMOSYNTHESIS AND CAD; ULTRASOUND LEFT BREAST LIMITED TECHNIQUE: Bilateral digital diagnostic mammography and breast tomosynthesis was performed. The images were evaluated with computer-aided detection.; Targeted ultrasound examination of the left breast was performed COMPARISON:   Previous exam(s). ACR Breast Density Category b: There are scattered areas of fibroglandular density. FINDINGS: Exam demonstrates stable post lumpectomy changes over the inner upper right breast. Remainder of the right breast is unchanged. There are post lumpectomy changes over the upper-outer left breast. There is an oval mass along the inferior medial aspect of the lumpectomy site with partially circumscribed and partially irregular borders. Remainder of the left breast is unremarkable. Targeted ultrasound is performed, showing a small irregular hypoechoic mass at the 2 o'clock position of the left breast 2 cm from the nipple measuring 4 x 5 x 7 mm with an immediately adjacent simple cystic area measuring 1.4 cm in greatest diameter. These 2 findings together account for the mammographic abnormality. This may represent a solid and cystic mass versus 2 separate adjacent processes. Ultrasound of the left axilla is normal. IMPRESSION: 1. Suspicious solid and cystic mass over the inferior medial aspect of the left lumpectomy site at the 2 o'clock position 2 cm from the nipple. Suspicious solid component measures 7 mm in diameter. No abnormal left axillary lymph nodes. 2.  Stable post lumpectomy changes of the right breast. RECOMMENDATION: Recommend ultrasound-guided core needle biopsy of this suspicious solid and cystic mass at the 2 o'clock position of the left breast targeting the suspicious  solid component. I have discussed the findings and recommendations with the patient. If applicable, a reminder letter will be sent to the patient regarding the next appointment. BI-RADS CATEGORY  4: Suspicious. Biopsy will be scheduled here at the [facility] prior to patient's departure. Electronically Signed   By: Daniel  Boyle M.D.   On: 03/23/2020 16:57   MM DIAG BREAST TOMO BILATERAL  Result Date: 03/23/2020 CLINICAL DATA:  Patient presents for bilateral diagnostic exam due to history of bilateral malignant lumpectomy  2019. EXAM: DIGITAL DIAGNOSTIC BILATERAL MAMMOGRAM WITH TOMOSYNTHESIS AND CAD; ULTRASOUND LEFT BREAST LIMITED TECHNIQUE: Bilateral digital diagnostic mammography and breast tomosynthesis was performed. The images were evaluated with computer-aided detection.; Targeted ultrasound examination of the left breast was performed COMPARISON:  Previous exam(s). ACR Breast Density Category b: There are scattered areas of fibroglandular density. FINDINGS: Exam demonstrates stable post lumpectomy changes over the inner upper right breast. Remainder of the right breast is unchanged. There are post lumpectomy changes over the upper-outer left breast. There is an oval mass along the inferior medial aspect of the lumpectomy site with partially circumscribed and partially irregular borders. Remainder of the left breast is unremarkable. Targeted ultrasound is performed, showing a small irregular hypoechoic mass at the 2 o'clock position of the left breast 2 cm from the nipple measuring 4 x 5 x 7 mm with an immediately adjacent simple cystic area measuring 1.4 cm in greatest diameter. These 2 findings together account for the mammographic abnormality. This may represent a solid and cystic mass versus 2 separate adjacent processes. Ultrasound of the left axilla is normal. IMPRESSION: 1. Suspicious solid and cystic mass over the inferior medial aspect of the left lumpectomy site at the 2 o'clock position 2 cm from the nipple. Suspicious solid component measures 7 mm in diameter. No abnormal left axillary lymph nodes. 2.  Stable post lumpectomy changes of the right breast. RECOMMENDATION: Recommend ultrasound-guided core needle biopsy of this suspicious solid and cystic mass at the 2 o'clock position of the left breast targeting the suspicious solid component. I have discussed the findings and recommendations with the patient. If applicable, a reminder letter will be sent to the patient regarding the next appointment. BI-RADS CATEGORY   4: Suspicious. Biopsy will be scheduled here at the [facility] prior to patient's departure. Electronically Signed   By: Daniel  Boyle M.D.   On: 03/23/2020 16:57   MM CLIP PLACEMENT LEFT  Result Date: 03/27/2020 CLINICAL DATA:  Evaluate RIBBON clip placement following ultrasound-guided LEFT breast biopsy. EXAM: DIAGNOSTIC LEFT MAMMOGRAM POST ULTRASOUND BIOPSY COMPARISON:  Previous exam(s). FINDINGS: Mammographic images were obtained following ultrasound guided biopsy of the complex cystic mass at the 2 o'clock position of the LEFT breast. The RIBBON biopsy marking clip is in expected position at the site of biopsy. IMPRESSION: Appropriate positioning of the RIBBON shaped biopsy marking clip at the site of biopsy in the UPPER OUTER LEFT breast. Final Assessment: Post Procedure Mammograms for Marker Placement Electronically Signed   By: Jeffrey  Hu M.D.   On: 03/27/2020 14:39   US LT BREAST BX W LOC DEV 1ST LESION IMG BX SPEC US GUIDE  Addendum Date: 03/29/2020   ADDENDUM REPORT: 03/29/2020 12:34 ADDENDUM: Pathology revealed HIGH GRADE DUCTAL CARCINOMA IN SITU, LOBULAR CARCINOMA IN SITU of the LEFT breast, 2 o'clock. Associated with the HG DCIS is a focus of crushed cells with attenuated myoepithelium, for which a focus of invasion cannot be entirely excluded. This was found to be concordant by Dr.   Hassan Rowan. Pathology results were discussed with the patient by telephone. The patient reported doing well after the biopsy with minimal tenderness at the site. Post biopsy instructions and care were reviewed and questions were answered. The patient was encouraged to call [facility] for any additional concerns. My direct phone number was provided. Surgical consultation has been arranged with Dr. Autumn Messing at [facility] on April 03, 2020. Dr[facility] Truitt Merle at [facility] was notified of biopsy results via EPIC message on March 29, 2020. Pathology results reported  by Terie Purser, RN on 03/29/2020. Electronically Signed   By: Margarette Canada M.D.   On: 03/29/2020 12:34   Result Date: 03/29/2020 CLINICAL DATA:  87 year old female for tissue sampling of complex cystic mass within the UPPER-OUTER LEFT breast EXAM: ULTRASOUND GUIDED LEFT BREAST CORE NEEDLE BIOPSY COMPARISON:  Previous exam(s). FINDINGS: I met with the patient and we discussed the procedure of ultrasound-guided biopsy, including benefits and alternatives. We discussed the high likelihood of a successful procedure. We discussed the risks of the procedure, including infection, bleeding, tissue injury, clip migration, and inadequate sampling. Informed written consent was given. The usual time-out protocol was performed immediately prior to the procedure. Using sterile technique and 1% Lidocaine as local anesthetic, under direct ultrasound visualization, a 14 gauge spring-loaded device was used to perform biopsy of the hypoechoic/solid portion of the complex cystic mass at the 2 o'clock position of the LEFT breast 2 cm from the nipple using a MEDIAL approach. At the conclusion of the procedure a RIBBON tissue marker clip was deployed into the biopsy cavity. Follow up 2 view mammogram was performed and dictated separately. IMPRESSION: Ultrasound guided biopsy of complex cystic mass in the UPPER-OUTER LEFT breast. No apparent complications. Electronically Signed: By: Margarette Canada M.D. On: 03/27/2020 14:29    Assessment/Plan  GERD (gastroesophageal reflux disease) stable, on Omeprazole.   Chronic constipation stable, on MiraLax qd, Colace qd.    URINARY INCONTINENCE, URGE, MILD stable, on Vesicare 47m qd.    Osteoarthritis, multiple sites radiculopathy R, Ortho 04/28/19,Ibuprofen, Gabapentin,Vit B6   Hyponatremia Na 131 02/22/20   Osteopenia takes Zometa.   Disease of blood and blood forming organ Hgb 8.0 04/03/20, f/u oncology.   [place], off [place],  plt 193 02/22/20   Restless leg syndrome stable, on Gabapentin 3036mqam 90091mpm.   Hypertension Metoprolol 12.5mg54md since 05/24/19, HCTZ 25mg11m Bun/creat 10/0.78 02/22/20   Malignant neoplasm of upper-inner quadrant of right breast in female, estrogen receptor positive (HCC) [place] breast cancer, s/p lumpectomy, radiation, off Adjuvant, f/u oncology.   Morton's neuroma of third interspaces of both feet The R+L ball part feet pain when walking, not new, Hx of Metatarsalgia, Morton's neuroma  Prepatellar effusion of right knee Prepatellar bursitis of the right knee, chronic. Takes prn Ibuprofen.    Labs/tests ordered: none  Next appt: 4 months

## 2020-04-05 NOTE — Assessment & Plan Note (Signed)
stable, on MiraLax qd, Colace qd.

## 2020-04-05 NOTE — Assessment & Plan Note (Signed)
Metoprolol 12.5mg  bid since 05/24/19, HCTZ 25mg  qd, Bun/creat 10/0.78 02/22/20

## 2020-04-05 NOTE — Assessment & Plan Note (Signed)
Na 131 02/22/20

## 2020-04-05 NOTE — Assessment & Plan Note (Signed)
stable, on Omeprazole.  

## 2020-04-05 NOTE — Assessment & Plan Note (Signed)
Hgb 8.0 04/03/20, f/u oncology.

## 2020-04-05 NOTE — Assessment & Plan Note (Signed)
The R+L ball part feet pain when walking, not new, Hx of Metatarsalgia, Morton's neuroma

## 2020-04-05 NOTE — Assessment & Plan Note (Signed)
takes Zometa.

## 2020-04-05 NOTE — Assessment & Plan Note (Signed)
stable, on Gabapentin 300mg  qam 900mg  qpm.

## 2020-04-05 NOTE — Assessment & Plan Note (Signed)
Prepatellar bursitis of the right knee, chronic. Takes prn Ibuprofen.

## 2020-04-05 NOTE — Assessment & Plan Note (Addendum)
Bilateral breast cancer, s/p lumpectomy, radiation, off Adjuvant, f/u oncology.

## 2020-04-06 ENCOUNTER — Inpatient Hospital Stay: Payer: Medicare PPO

## 2020-04-06 ENCOUNTER — Other Ambulatory Visit: Payer: Self-pay

## 2020-04-06 ENCOUNTER — Encounter: Payer: Self-pay | Admitting: Nurse Practitioner

## 2020-04-06 ENCOUNTER — Inpatient Hospital Stay: Payer: Medicare PPO | Attending: Hematology

## 2020-04-06 DIAGNOSIS — D649 Anemia, unspecified: Secondary | ICD-10-CM

## 2020-04-06 DIAGNOSIS — Z7982 Long term (current) use of aspirin: Secondary | ICD-10-CM | POA: Diagnosis not present

## 2020-04-06 DIAGNOSIS — C50211 Malignant neoplasm of upper-inner quadrant of right female breast: Secondary | ICD-10-CM | POA: Diagnosis not present

## 2020-04-06 DIAGNOSIS — Z79899 Other long term (current) drug therapy: Secondary | ICD-10-CM | POA: Insufficient documentation

## 2020-04-06 DIAGNOSIS — Z171 Estrogen receptor negative status [ER-]: Secondary | ICD-10-CM | POA: Insufficient documentation

## 2020-04-06 DIAGNOSIS — D0512 Intraductal carcinoma in situ of left breast: Secondary | ICD-10-CM | POA: Insufficient documentation

## 2020-04-06 DIAGNOSIS — D75839 Thrombocytosis, unspecified: Secondary | ICD-10-CM | POA: Insufficient documentation

## 2020-04-06 DIAGNOSIS — Z170 Estrogen receptor positive status [ER+]: Secondary | ICD-10-CM | POA: Insufficient documentation

## 2020-04-06 LAB — TYPE AND SCREEN
ABO/RH(D): B POS
Antibody Screen: NEGATIVE
Unit division: 0

## 2020-04-06 LAB — PREPARE RBC (CROSSMATCH)

## 2020-04-06 LAB — BPAM RBC
Blood Product Expiration Date: 202204192359
Unit Type and Rh: 7300

## 2020-04-06 MED ORDER — SODIUM CHLORIDE 0.9% IV SOLUTION
250.0000 mL | Freq: Once | INTRAVENOUS | Status: AC
  Administered 2020-04-06: 250 mL via INTRAVENOUS
  Filled 2020-04-06: qty 250

## 2020-04-06 NOTE — Patient Instructions (Signed)

## 2020-04-07 LAB — TYPE AND SCREEN
ABO/RH(D): B POS
Antibody Screen: NEGATIVE
Unit division: 0

## 2020-04-07 LAB — BPAM RBC
Blood Product Expiration Date: 202204192359
ISSUE DATE / TIME: 202204011111
Unit Type and Rh: 7300

## 2020-04-07 LAB — METHYLMALONIC ACID, SERUM: Methylmalonic Acid, Quantitative: 232 nmol/L (ref 0–378)

## 2020-04-09 ENCOUNTER — Telehealth: Payer: Self-pay | Admitting: Nurse Practitioner

## 2020-04-09 ENCOUNTER — Encounter: Payer: Self-pay | Admitting: *Deleted

## 2020-04-09 NOTE — Telephone Encounter (Signed)
Scheduled per 4/4 sch msg. Pt will receive an updated appt calendar per next visit appt notes  

## 2020-04-10 ENCOUNTER — Other Ambulatory Visit: Payer: Medicare PPO

## 2020-04-12 ENCOUNTER — Other Ambulatory Visit: Payer: Self-pay

## 2020-04-12 ENCOUNTER — Inpatient Hospital Stay (HOSPITAL_BASED_OUTPATIENT_CLINIC_OR_DEPARTMENT_OTHER): Payer: Medicare PPO | Admitting: Adult Health

## 2020-04-12 ENCOUNTER — Ambulatory Visit: Payer: Medicare PPO | Admitting: Adult Health

## 2020-04-12 ENCOUNTER — Inpatient Hospital Stay: Payer: Medicare PPO

## 2020-04-12 VITALS — BP 145/69 | HR 67 | Temp 98.1°F | Resp 17

## 2020-04-12 DIAGNOSIS — Z171 Estrogen receptor negative status [ER-]: Secondary | ICD-10-CM | POA: Diagnosis not present

## 2020-04-12 DIAGNOSIS — D649 Anemia, unspecified: Secondary | ICD-10-CM | POA: Diagnosis not present

## 2020-04-12 DIAGNOSIS — D473 Essential (hemorrhagic) thrombocythemia: Secondary | ICD-10-CM

## 2020-04-12 DIAGNOSIS — Z170 Estrogen receptor positive status [ER+]: Secondary | ICD-10-CM

## 2020-04-12 DIAGNOSIS — D0512 Intraductal carcinoma in situ of left breast: Secondary | ICD-10-CM | POA: Diagnosis not present

## 2020-04-12 DIAGNOSIS — C50211 Malignant neoplasm of upper-inner quadrant of right female breast: Secondary | ICD-10-CM | POA: Diagnosis not present

## 2020-04-12 DIAGNOSIS — Z7982 Long term (current) use of aspirin: Secondary | ICD-10-CM | POA: Diagnosis not present

## 2020-04-12 DIAGNOSIS — D75839 Thrombocytosis, unspecified: Secondary | ICD-10-CM | POA: Diagnosis not present

## 2020-04-12 DIAGNOSIS — Z79899 Other long term (current) drug therapy: Secondary | ICD-10-CM | POA: Diagnosis not present

## 2020-04-12 LAB — CMP (CANCER CENTER ONLY)
ALT: 31 U/L (ref 0–44)
AST: 30 U/L (ref 15–41)
Albumin: 3.8 g/dL (ref 3.5–5.0)
Alkaline Phosphatase: 65 U/L (ref 38–126)
Anion gap: 9 (ref 5–15)
BUN: 16 mg/dL (ref 8–23)
CO2: 27 mmol/L (ref 22–32)
Calcium: 8.7 mg/dL — ABNORMAL LOW (ref 8.9–10.3)
Chloride: 95 mmol/L — ABNORMAL LOW (ref 98–111)
Creatinine: 0.8 mg/dL (ref 0.44–1.00)
GFR, Estimated: >60 mL/min (ref >60)
Glucose, Bld: 82 mg/dL (ref 70–99)
Potassium: 3.8 mmol/L (ref 3.5–5.1)
Sodium: 131 mmol/L — ABNORMAL LOW (ref 135–145)
Total Bilirubin: 0.8 mg/dL (ref 0.3–1.2)
Total Protein: 6.2 g/dL — ABNORMAL LOW (ref 6.5–8.1)

## 2020-04-12 LAB — CBC WITH DIFFERENTIAL (CANCER CENTER ONLY)
Abs Immature Granulocytes: 3.3 10*3/uL — ABNORMAL HIGH (ref 0.00–0.07)
Band Neutrophils: 22 %
Basophils Absolute: 0.6 10*3/uL — ABNORMAL HIGH (ref 0.0–0.1)
Basophils Relative: 5 %
Blasts: 2 %
Eosinophils Absolute: 0 10*3/uL (ref 0.0–0.5)
Eosinophils Relative: 0 %
HCT: 26.9 % — ABNORMAL LOW (ref 36.0–46.0)
Hemoglobin: 8.6 g/dL — ABNORMAL LOW (ref 12.0–15.0)
Lymphocytes Relative: 17 %
Lymphs Abs: 1.9 10*3/uL (ref 0.7–4.0)
MCH: 36.9 pg — ABNORMAL HIGH (ref 26.0–34.0)
MCHC: 32 g/dL (ref 30.0–36.0)
MCV: 115.5 fL — ABNORMAL HIGH (ref 80.0–100.0)
Metamyelocytes Relative: 7 %
Monocytes Absolute: 0.6 10*3/uL (ref 0.1–1.0)
Monocytes Relative: 5 %
Myelocytes: 21 %
Neutro Abs: 4.8 10*3/uL (ref 1.7–7.7)
Neutrophils Relative %: 20 %
Platelet Count: 170 10*3/uL (ref 150–400)
Promyelocytes Relative: 1 %
RBC: 2.33 MIL/uL — ABNORMAL LOW (ref 3.87–5.11)
RDW: 20.9 % — ABNORMAL HIGH (ref 11.5–15.5)
WBC Count: 11.4 10*3/uL — ABNORMAL HIGH (ref 4.0–10.5)
nRBC: 2.3 % — ABNORMAL HIGH (ref 0.0–0.2)

## 2020-04-12 MED ORDER — LIDOCAINE HCL 2 % IJ SOLN
INTRAMUSCULAR | Status: AC
  Filled 2020-04-12: qty 20

## 2020-04-12 NOTE — Progress Notes (Signed)
INDICATION:  Refractory anemia  Brief examination was performed. ENT: adequate airway clearance Heart: regular rate and rhythm.No Murmurs Lungs: clear to auscultation, no wheezes, normal respiratory effort  Bone Marrow Biopsy and Aspiration Procedure Note   Informed consent was obtained and potential risks including bleeding, infection and pain were reviewed with the patient.  The patient's name, date of birth, identification, consent and allergies were verified prior to the start of procedure and time out was performed.  The right posterior iliac crest was chosen as the site of biopsy.  The skin was prepped with ChloraPrep.   8 cc of 2% lidocaine was used to provide local anaesthesia.   10 cc of bone marrow aspirate was obtained followed by 1cm biopsy.  Pressure was applied to the biopsy site and bandage was placed over the biopsy site. Patient was made to lie on the back for 30 mins prior to discharge.  The procedure was tolerated well. COMPLICATIONS: None BLOOD LOSS: none The patient was discharged home in stable condition with a 1 week follow up to review results.  Patient was provided with post bone marrow biopsy instructions and instructed to call if there was any bleeding or worsening pain.  Specimens sent for flow cytometry, cytogenetics and additional studies.  Signed Scot Dock, NP

## 2020-04-12 NOTE — Progress Notes (Signed)
Patient dressing C,D,I. VSS, patient provided DC paperwork which was reviewed. Labs drawn and patient ambulated independently to her daughter in the lobby.

## 2020-04-12 NOTE — Patient Instructions (Signed)
Bone Marrow Aspiration and Bone Marrow Biopsy, Adult, Care After This sheet gives you information about how to care for yourself after your procedure. Your health care provider may also give you more specific instructions. If you have problems or questions, contact your health care provider. What can I expect after the procedure? After the procedure, it is common to have:  Mild pain and tenderness.  Swelling.  Bruising. Follow these instructions at home: Puncture site care  Follow instructions from your health care provider about how to take care of the puncture site. Make sure you: ? Wash your hands with soap and water before and after you change your bandage (dressing). If soap and water are not available, use hand sanitizer. ? Change your dressing as told by your health care provider.  Check your puncture site every day for signs of infection. Check for: ? More redness, swelling, or pain. ? Fluid or blood. ? Warmth. ? Pus or a bad smell.   Activity  Return to your normal activities as told by your health care provider. Ask your health care provider what activities are safe for you.  Do not lift anything that is heavier than 10 lb (4.5 kg), or the limit that you are told, until your health care provider says that it is safe.  Do not drive for 24 hours if you were given a sedative during your procedure. General instructions  Take over-the-counter and prescription medicines only as told by your health care provider.  Do not take baths, swim, or use a hot tub until your health care provider approves. Ask your health care provider if you may take showers. You may only be allowed to take sponge baths.  If directed, put ice on the affected area. To do this: ? Put ice in a plastic bag. ? Place a towel between your skin and the bag. ? Leave the ice on for 20 minutes, 2-3 times a day.  Keep all follow-up visits as told by your health care provider. This is important.   Contact a  health care provider if:  Your pain is not controlled with medicine.  You have a fever.  You have more redness, swelling, or pain around the puncture site.  You have fluid or blood coming from the puncture site.  Your puncture site feels warm to the touch.  You have pus or a bad smell coming from the puncture site. Summary  After the procedure, it is common to have mild pain, tenderness, swelling, and bruising.  Follow instructions from your health care provider about how to take care of the puncture site and what activities are safe for you.  Take over-the-counter and prescription medicines only as told by your health care provider.  Contact a health care provider if you have any signs of infection, such as fluid or blood coming from the puncture site. This information is not intended to replace advice given to you by your health care provider. Make sure you discuss any questions you have with your health care provider. Document Revised: 05/11/2018 Document Reviewed: 05/11/2018 Elsevier Patient Education  2021 Elsevier Inc.  

## 2020-04-17 ENCOUNTER — Other Ambulatory Visit: Payer: Self-pay

## 2020-04-17 ENCOUNTER — Inpatient Hospital Stay: Payer: Medicare PPO | Admitting: Hematology

## 2020-04-17 ENCOUNTER — Encounter: Payer: Self-pay | Admitting: Hematology

## 2020-04-17 ENCOUNTER — Telehealth: Payer: Self-pay | Admitting: Hematology

## 2020-04-17 ENCOUNTER — Inpatient Hospital Stay: Payer: Medicare PPO

## 2020-04-17 VITALS — BP 130/73 | HR 88 | Temp 99.2°F | Resp 17 | Ht 63.0 in | Wt 120.8 lb

## 2020-04-17 DIAGNOSIS — C50211 Malignant neoplasm of upper-inner quadrant of right female breast: Secondary | ICD-10-CM | POA: Diagnosis not present

## 2020-04-17 DIAGNOSIS — D75839 Thrombocytosis, unspecified: Secondary | ICD-10-CM | POA: Diagnosis not present

## 2020-04-17 DIAGNOSIS — Z7982 Long term (current) use of aspirin: Secondary | ICD-10-CM | POA: Diagnosis not present

## 2020-04-17 DIAGNOSIS — Z171 Estrogen receptor negative status [ER-]: Secondary | ICD-10-CM | POA: Diagnosis not present

## 2020-04-17 DIAGNOSIS — D649 Anemia, unspecified: Secondary | ICD-10-CM

## 2020-04-17 DIAGNOSIS — Z170 Estrogen receptor positive status [ER+]: Secondary | ICD-10-CM

## 2020-04-17 DIAGNOSIS — D0512 Intraductal carcinoma in situ of left breast: Secondary | ICD-10-CM | POA: Diagnosis not present

## 2020-04-17 DIAGNOSIS — Z79899 Other long term (current) drug therapy: Secondary | ICD-10-CM | POA: Diagnosis not present

## 2020-04-17 LAB — CBC WITH DIFFERENTIAL (CANCER CENTER ONLY)
Abs Immature Granulocytes: 4.4 10*3/uL — ABNORMAL HIGH (ref 0.00–0.07)
Band Neutrophils: 11 %
Basophils Absolute: 0.8 10*3/uL — ABNORMAL HIGH (ref 0.0–0.1)
Basophils Relative: 6 %
Eosinophils Absolute: 0.1 10*3/uL (ref 0.0–0.5)
Eosinophils Relative: 1 %
HCT: 26 % — ABNORMAL LOW (ref 36.0–46.0)
Hemoglobin: 8.5 g/dL — ABNORMAL LOW (ref 12.0–15.0)
Lymphocytes Relative: 15 %
Lymphs Abs: 2.1 10*3/uL (ref 0.7–4.0)
MCH: 37.3 pg — ABNORMAL HIGH (ref 26.0–34.0)
MCHC: 32.7 g/dL (ref 30.0–36.0)
MCV: 114 fL — ABNORMAL HIGH (ref 80.0–100.0)
Metamyelocytes Relative: 12 %
Monocytes Absolute: 1.4 10*3/uL — ABNORMAL HIGH (ref 0.1–1.0)
Monocytes Relative: 10 %
Myelocytes: 20 %
Neutro Abs: 5 10*3/uL (ref 1.7–7.7)
Neutrophils Relative %: 25 %
Platelet Count: 219 10*3/uL (ref 150–400)
RBC: 2.28 MIL/uL — ABNORMAL LOW (ref 3.87–5.11)
RDW: 21.1 % — ABNORMAL HIGH (ref 11.5–15.5)
WBC Count: 13.8 10*3/uL — ABNORMAL HIGH (ref 4.0–10.5)
nRBC: 1.7 % — ABNORMAL HIGH (ref 0.0–0.2)

## 2020-04-17 LAB — SURGICAL PATHOLOGY

## 2020-04-17 NOTE — Progress Notes (Signed)
[facility]   Telephone:([phone]) [phone] Fax:(336) [phone]   Clinic Follow up Note   Patient Care Team: Mast, [name], NP as PCP - General (Internal Medicine) [name], MD as Consulting Physician (Dermatology) [name], MD as Consulting Physician (Sports Medicine) Jovita Kussmaul, MD as Consulting Physician (General Surgery) Raye Sorrow, MD as Consulting Physician (Pulmonary Disease) Truitt Merle, MD as Consulting Physician (Hematology) Gery Pray, MD as Consulting Physician (Radiation Oncology) Delice Bison, Charlestine Massed, NP as Nurse Practitioner (Hematology and Oncology) Annia Belt, MD as Consulting Physician (Oncology) Mauro Kaufmann, RN as Oncology Nurse Navigator Rockwell Germany, RN as Oncology Nurse Navigator 04/17/2020  CHIEF COMPLAINT: Discuss bone marrow biopsy results  SUMMARY OF ONCOLOGIC HISTORY: Oncology History Overview Note  Cancer Staging Malignant neoplasm of upper-inner quadrant of right breast in female, estrogen receptor positive ([place]) Staging form: Breast, AJCC 8th Edition - Clinical stage from 01/07/2017: Stage IA (cT1c, cN0, cM0, G3, ER: Positive, PR: Positive, HER2: Negative) - Signed by Truitt Merle, MD on 01/20/2017  Malignant neoplasm of upper-outer quadrant of left breast in female, estrogen receptor positive ([place]) Staging form: Breast, AJCC 8th Edition - Clinical stage from 01/07/2017: Stage IA (cT1b, cN0, cM0, G2, ER: Positive, PR: Positive, HER2: Negative) - Signed by Truitt Merle, MD on 01/20/2017     Malignant neoplasm of upper-inner quadrant of right breast in female, estrogen receptor positive ([place])  01/01/2017 Mammogram   IMPRESSION: 1. 2.0 cm mass in the 2 o'clock position of the right breast with imaging features highly suspicious for malignancy. 2. 0.7 cm mass in the 2 o'clock position of the left breast with imaging features highly suspicious for malignancy. 3. No abnormal appearing axillary lymph nodes on  either side.   01/07/2017 Receptors her2   Right Breast: Prognostic indicators significant for: ER, 95% positive and PR, 50% positive, both with strong staining intensity. Proliferation marker Ki67 at 30. HER2 negative. RATIO OF HER2/CEP17 SIGNALS 0.81 AVERAGE HER2 COPY NUMBER PER CELL 1.70  Left Breast: Prognostic indicators significant for: ER, 100% positive and PR, 100% positive, both with strong staining intensity. Proliferation marker Ki67 at 10. HER2 negative. RATIO OF HER2/CEP17 SIGNALS 1.75 AVERAGE HER2 COPY NUMBER PER CELL 3.15   01/07/2017 Initial Biopsy   Diagnosis 1. Breast, right, needle core biopsy, 2:00 o'clock - INVASIVE DUCTAL CARCINOMA, SEE COMMENT. 2. Breast, left, needle core biopsy, 2:00 o'clock - INVASIVE MAMMARY CARCINOMA, SEE COMMENT.    01/20/2017 Initial Diagnosis   Malignant neoplasm of upper-inner quadrant of right breast in female, estrogen receptor positive ([place])   01/2017 - 03/2018 Anti-estrogen oral therapy   Anastrozole 1 mg daily started on 01/2017, stopped around 01/2018 due to diffuse joint pain. She switched to Exemestane on 03/26/2018 but stopped after 1 week due to gluteal pain.    02/20/2017 Surgery   RIGHT BREAST LUMPECTOMY (Right) LEFT BREAST LUMPECTOMY WITH RADIOACTIVE SEED LOCALIZATION (Left)  Per Dr. Marlou Starks    02/20/2017 Pathology Results    Diagnosis 1. Breast, lumpectomy, Left w/ seed - INVASIVE DUCTAL CARCINOMA, GRADE I/III, SPANNING 1.1 CM. - THE SURGICAL RESECTION MARGINS ARE NEGATIVE FOR CARCINOMA. - SEE ONCOLOGY TABLE BELOW. 2. Breast, lumpectomy, Right - INVASIVE DUCTAL CARCINOMA, GRADE II/III, SPANNING 2.1 CM. - DUCTAL CARCINOMA IN SITU, INTERMEDIATE GRADE. - PERINEURAL INVASION IS IDENTIFIED. - THE SURGICAL RESECTION MARGINS ARE NEGATIVE FOR CARCINOMA. - SEE ONCOLOGY TABLE BELOW. 3. Breast, excision, Right inferior - LOBULAR NEOPLASIA (ATYPICAL LOBULAR HYPERPLASIA). - FAT NECROSIS. - SEE COMMENT.  Microscopic Comment  1.  BREAST, INVASIVE TUMOR (LEFT) Procedure: Seed localized lumpectomy Laterality: Left Tumor Size: 1.1 cm (gross measurement). Histologic Type: Ductal Grade: I Tubular Differentiation: 2 Nuclear Pleomorphism: 1 Mitotic Count: 1 Ductal Carcinoma in Situ (DCIS): Not identified Extent of Tumor: Confined to breast parenchyma. Margins: Greater than 0.2 cm to all margins. Regional Lymph Nodes: None examined. Breast Prognostic Profile: Case [id number] Estrogen Receptor: 100%, strong Progesterone Receptor: 100%, strong Her2: No amplification was detected. The ratio was 0.81 Ki-67: 10% Best tumor block for sendout testing: 1B Pathologic Stage Classification (pTNM, AJCC 8th Edition): Primary Tumor (pT): pT1c Regional Lymph Nodes (pN): pNX Distant Metastases (pM): pMX  2. BREAST, INVASIVE TUMOR (RIGHT) Procedure: Lumpectomy Laterality: Right Tumor Size: 2.1 cm (gross measurement) Histologic Type: Ductal Grade: II Tubular Differentiation: 3 Nuclear Pleomorphism: 2 Mitotic Count: 1 Ductal Carcinoma in Situ (DCIS): Present, intermediate grade Extent of Tumor: Confined to breast parenchyma. Margins: Greater than 0.2 cm to all margins Regional Lymph Nodes: None examined. Breast Prognostic Profile: Case [id number] Estrogen Receptor: 95%, strong Progesterone Receptor: 50%, strong Her2: No amplification was detected. The ratio was 0.81 Ki-67: 30% Best tumor block for sendout testing: 2A Pathologic Stage Classification (pTNM, AJCC 8th Edition): Primary Tumor (pT): pT2 Regional Lymph Nodes (pN): pNX Distant Metastases (pM): pMX 3. The surgical resection margin(s) of the specimen were inked and microscopically evaluated.   03/31/2017 - 05/11/2017 Radiation Therapy   1. Right Breast / 50 Gy in 25 fractions 2. Right Axilla / 45 Gy in 25 fractions 3. Right Breast Boost / 10 Gy in 5 fractions   Malignant neoplasm of upper-outer quadrant of left breast in female, estrogen receptor  positive ([place])  01/20/2017 Initial Diagnosis   Malignant neoplasm of upper-outer quadrant of left breast in female, estrogen receptor positive ([place])   02/20/2017 Surgery   RIGHT BREAST LUMPECTOMY (Right) LEFT BREAST LUMPECTOMY WITH RADIOACTIVE SEED LOCALIZATION (Left)  Per Dr. Marlou Starks     02/12/2018 Mammogram   IMPRESSION: No mammographic evidence of breast malignancy      CURRENT THERAPY: pending   INTERVAL HISTORY: Jill Myers returns for follow-up.  She presents to the clinic with her daughter, who is from [place].  She underwent bone marrow biopsy last week, tolerated procedure well overall, with moderate pain which has resolved.  Her energy level has much improved after blood transfusion 2 weeks ago, also overall still slightly low, she is able to function at home.  She states her energy level is about 70% of her baseline.  She denies any fever or chills, night sweats, pain, dyspnea, abdominal discomfort or other new symptoms.  All other systems were reviewed with the patient and are negative.  MEDICAL HISTORY:  Past Medical History:  Diagnosis Date  . Bladder cystocele 05/21/2010  . Complication of anesthesia    deglutition in history  . Deglutition disorder 08/17/2012   Single episode of choking/aspiration; preceded by trouble swallowing saliva (thin liquids).  For SLP evaluation. Aug 17, 2012.    [name] Disease of blood and blood forming organ 01/05/2007   Qualifier: Diagnosis of  By: Oneida Alar MD, KARL    . Diverticulitis   . DIVERTICULOSIS OF COLON 03/05/2006   Qualifier: Diagnosis of  By: Samara Snide    . DIZZINESS 01/18/2010   Qualifier: Diagnosis of  By: Lindell Noe MD, Jeneen Rinks    . Essential hypertension, benign 02/22/2008   Qualifier: Diagnosis of  By: Lindell Noe MD, Jeneen Rinks     . Essential thrombocythemia ([place]) 01/31/11  . Family history of Alzheimer's disease  11/11/2013   Patient with family history of Alzheimers disease. She herself is very highly functional.  Plans for future visit with MMSE as  primary function of the visit.    [name] GERD (gastroesophageal reflux disease) 08/16/2010  . HALLUX VALGUS, ACQUIRED 04/10/2009   Qualifier: Diagnosis of  By: [name] CMA,, [place]    . Hypertension   . HYPERTRIGLYCERIDEMIA 01/05/2007   Qualifier: Diagnosis of  By: Oneida Alar MD, KARL    . Insomnia 02/10/2014  . Left-sided chest wall pain 12/19/2011    L sided chest wall pain follows a dermatomal distribution and raises the possibility of thoracic radiculopathic pain, which would be expected to improve with gabapentin as well. We discussed this at length and she will call to make me aware if this does not get better. Rib belt/girdle in the meantime; may consider rib films or CXR if not improving.     . Metatarsalgia of right foot 11/28/2013   Referral to [facility]   . Osteopenia   . Personal history of radiation therapy    03/2017 right breast  . Restless leg syndrome 06/09/2011  . Restless legs syndrome (RLS)   . URINARY INCONTINENCE, URGE, MILD 11/27/2009   Qualifier: Diagnosis of  By: Zebedee Iba NP, Manuela Schwartz    . Vitamin D deficiency 08/16/2010    SURGICAL HISTORY: Past Surgical History:  Procedure Laterality Date  . BIOPSY BREAST Left 1998   Dr. Rebekah Chesterfield  . BREAST EXCISIONAL BIOPSY    . BREAST LUMPECTOMY Right 02/20/2017   Procedure: RIGHT BREAST LUMPECTOMY;  Surgeon: Jovita Kussmaul, MD;  Location: [place];  Service: General;  Laterality: Right;  . BREAST LUMPECTOMY Left 02/20/2017   left lumpectomy  . BREAST LUMPECTOMY WITH RADIOACTIVE SEED LOCALIZATION Left 02/20/2017   Procedure: LEFT BREAST LUMPECTOMY WITH RADIOACTIVE SEED LOCALIZATION;  Surgeon: Jovita Kussmaul, MD;  Location: [place];  Service: General;  Laterality: Left;  . CATARACT EXTRACTION  2010   Dr. [name]  . CHOLECYSTECTOMY  1992  . COLONOSCOPY  2008  . MOHS SURGERY  2012   on face [facility] [place]  . NAILBED REPAIR  12/05/2010   Procedure: NAILBED REPAIR;  Surgeon: Cammie Sickle., MD;   Location: [place];  Service: Orthopedics;  Laterality: Right;  full thickness nail biopsy right hallux  . torn tenden  2012   hand Dr. Orie Rout    I have reviewed the social history and family history with the patient and they are unchanged from previous note.  ALLERGIES:  is allergic to doxycycline, macrodantin [nitrofurantoin], and sulfamethoxazole-trimethoprim.  MEDICATIONS:  Current Outpatient Medications  Medication Sig Dispense Refill  . aspirin 81 MG tablet Take 81 mg by mouth daily.    . calcium carbonate (OSCAL) 1500 (600 Ca) MG TABS tablet Take 600 mg of elemental calcium by mouth in the morning and at bedtime.     . cholecalciferol (VITAMIN D) 1000 UNITS tablet Take 2,000 Units by mouth daily.     [name] gabapentin (NEURONTIN) 300 MG capsule TAKE 1 CAPSULE BY MOUTH EVERY MORNING AND TAKE 3 CAPSULES BY MOUTH EVERY EVENING 360 capsule 1  . GLUCOSAMINE-CHONDROITIN PO Take by mouth. 1500-1200  Take two tablet daily    . hydrochlorothiazide (HYDRODIURIL) 25 MG tablet TAKE 1 TABLET BY MOUTH EVERY DAY 90 tablet 1  . ibuprofen (ADVIL,MOTRIN) 200 MG tablet Take 400 mg by mouth as needed.     . Magnesium 500 MG TABS Take 1 tablet by mouth daily.    [name]  metoprolol tartrate (LOPRESSOR) 25 MG tablet TAKE 1/2 TABLET BY MOUTH TWICE A DAY 90 tablet 1  . Omega-3 Fatty Acids (FISH OIL PO) Take by mouth.    [name] omeprazole (PRILOSEC) 20 MG capsule TAKE 1 CAPSULE BY MOUTH EVERY DAY 90 capsule 1  . polyethylene glycol (MIRALAX / GLYCOLAX) packet Take 17 g by mouth daily.    . potassium chloride SA (KLOR-CON) 20 MEQ tablet Take 20 mEq by mouth daily.    [name] pyridOXINE (VITAMIN B-6) 100 MG tablet Take 100 mg by mouth daily.    . solifenacin (VESICARE) 5 MG tablet Take 5 mg by mouth daily.     No current facility-administered medications for this visit.    PHYSICAL EXAMINATION: ECOG PERFORMANCE STATUS: 1 - Symptomatic but completely ambulatory  Vitals:   04/17/20 1038  BP: 130/73  Pulse:  88  Resp: 17  Temp: 99.2 F (37.3 C)  SpO2: 98%   Filed Weights   04/17/20 1038  Weight: 120 lb 12.8 oz (54.8 kg)    GENERAL:alert, no distress and comfortable SKIN: skin color, texture, turgor are normal, no rashes or significant lesions EYES: normal, Conjunctiva are pink and non-injected, sclera clear ABDOMEN:abdomen soft, non-tender and normal bowel sounds Musculoskeletal:no cyanosis of digits and no clubbing  No leg edema  NEURO: alert & oriented x 3 with fluent speech, no focal motor/sensory deficits  LABORATORY DATA:  I have reviewed the data as listed CBC Latest Ref Rng & Units 04/17/2020 04/12/2020 04/03/2020  WBC 4.0 - 10.5 K/uL 13.8(H) 11.4(H) 7.8  Hemoglobin 12.0 - 15.0 g/dL 8.5(L) 8.6(L) 8.0(L)  Hematocrit 36.0 - 46.0 % 26.0(L) 26.9(L) 22.9(L)  Platelets 150 - 400 K/uL 219 170 131(L)     CMP Latest Ref Rng & Units 04/12/2020 02/22/2020 10/10/2019  Glucose 70 - 99 mg/dL 82 84 95  BUN 8 - 23 mg/dL _0 Creatinine 0.44 - 1.00 mg/dL 0.80 0.78 0.73  Sodium 135 - 145 mmol/L 131(L) 131(L) 129(L)  Potassium 3.5 - 5.1 mmol/L 3.8 3.3(L) 4.0  Chloride 98 - 111 mmol/L 95(L) 98 95(L)  CO2 22 - 32 mmol/L _1 Calcium 8.9 - 10.3 mg/dL 8.7(L) 8.4(L) 9.0  Total Protein 6.5 - 8.1 g/dL 6.2(L) 6.4(L) 6.5  Total Bilirubin 0.3 - 1.2 mg/dL 0.8 0.7 0.6  Alkaline Phos 38 - 126 U/L 65 42 43  AST 15 - 41 U/L _2 ALT 0 - 44 U/L _3 RADIOGRAPHIC STUDIES: I have personally reviewed the radiological images as listed and agreed with the findings in the report. No results found.   ASSESSMENT & PLAN:  87 year old Caucasian female   1.  Essential thrombocythemia, new anemia,  likely evolving to MPN/MDS  -Diagnosed in 2005, has been on Hydrea since then on 07 March 2020.  Stopped due to moderate anemia -Patient developed moderate symptomatic anemia required blood transfusion in March 2022 -I reviewed her bone marrow biopsy with pathologist Dr. Davina Poke this morning.   It showed 5 to 10% blasts, with left shift of marrow cells.  Bone marrow is significantly hypercellular (>90%).  This is concerning for MPN evolving to preleukemia, such as MDS. Will send MDS FISH panel to see if she has 5 q. Deletion -Cytogenetics still pending -I discussed she may develop acute leukemia down the road.  Her moderate anemia is related to the disease progression of her ET.  -I discussed management option of her anemia, including blood transfusion,  nutritional anemia, and growth factor Epogen.  Patient voiced good understanding and agrees with plan  -We discussed that secondary MDS is more difficult to treat, and unlikely curable. -Lab reviewed, hemoglobin 8.5 today, no need for change.  plt normal  -We will repeat labs every 2 weeks -Follow-up in 4 weeks  2. Newly diagnosed left breast DCIS, high grade , ER-/PR- -surgery is on hold due to her recent anemia -I recommend surgery in 2 to 3 months when her anemia is improved  3. 3.  Bilateral breast cancer, invasive ductal carcinoma, right upper inner quadrant, pT2N0M0, stage IA, ER+/PR+/HER2-, G3, left upper-outer quadrant,pT1cN0M0, stage IA, ER+/PR+/HER2-, G2 --She was diagnosed on 01/20/2017, and is s/pbilateral lumpectomy with negative margins,followed by adjuvant right breastand axillaryradiation. -Shetried anastrozole and Exemestane from 01/2017-01/2018 but stopped due to MSK pain. -Continue cancer surveillance  Plan -Bone marrow biopsy results reviewed with patient and her daughter -Lab in 2 weeks, consider blood transfusion if hemoglobin less than 8.0, or symptomatic anemia with hemoglobin less than 8.5 -Lab and follow-up in 4 weeks -will check insurance approval for EPO    All questions were answered. The patient knows to call the clinic with any problems, questions or concerns. No barriers to learning was detected. I spent 30 minutes counseling the patient face to face. The total time spent in the appointment was 40  minutes and more than 50% was on counseling and review of test results     Truitt Merle, MD 04/17/20

## 2020-04-17 NOTE — Telephone Encounter (Signed)
Scheduled per los. Gave avs and calendar  

## 2020-04-19 ENCOUNTER — Encounter: Payer: Self-pay | Admitting: *Deleted

## 2020-04-20 DIAGNOSIS — D649 Anemia, unspecified: Secondary | ICD-10-CM | POA: Diagnosis not present

## 2020-04-20 DIAGNOSIS — Z170 Estrogen receptor positive status [ER+]: Secondary | ICD-10-CM | POA: Diagnosis not present

## 2020-04-20 DIAGNOSIS — D0512 Intraductal carcinoma in situ of left breast: Secondary | ICD-10-CM | POA: Diagnosis not present

## 2020-04-20 DIAGNOSIS — D75839 Thrombocytosis, unspecified: Secondary | ICD-10-CM | POA: Diagnosis not present

## 2020-04-20 DIAGNOSIS — Z171 Estrogen receptor negative status [ER-]: Secondary | ICD-10-CM | POA: Diagnosis not present

## 2020-04-20 DIAGNOSIS — Z7982 Long term (current) use of aspirin: Secondary | ICD-10-CM | POA: Diagnosis not present

## 2020-04-20 DIAGNOSIS — C50211 Malignant neoplasm of upper-inner quadrant of right female breast: Secondary | ICD-10-CM | POA: Diagnosis not present

## 2020-04-20 DIAGNOSIS — Z79899 Other long term (current) drug therapy: Secondary | ICD-10-CM | POA: Diagnosis not present

## 2020-04-20 LAB — OCCULT BLOOD X 1 CARD TO LAB, STOOL
Fecal Occult Bld: NEGATIVE
Fecal Occult Bld: NEGATIVE
Fecal Occult Bld: NEGATIVE

## 2020-04-24 ENCOUNTER — Encounter (HOSPITAL_COMMUNITY): Payer: Self-pay | Admitting: Hematology

## 2020-05-02 ENCOUNTER — Other Ambulatory Visit: Payer: Self-pay

## 2020-05-02 ENCOUNTER — Inpatient Hospital Stay: Payer: Medicare PPO

## 2020-05-02 DIAGNOSIS — Z170 Estrogen receptor positive status [ER+]: Secondary | ICD-10-CM | POA: Diagnosis not present

## 2020-05-02 DIAGNOSIS — D473 Essential (hemorrhagic) thrombocythemia: Secondary | ICD-10-CM

## 2020-05-02 DIAGNOSIS — D75839 Thrombocytosis, unspecified: Secondary | ICD-10-CM | POA: Diagnosis not present

## 2020-05-02 DIAGNOSIS — Z7982 Long term (current) use of aspirin: Secondary | ICD-10-CM | POA: Diagnosis not present

## 2020-05-02 DIAGNOSIS — C50211 Malignant neoplasm of upper-inner quadrant of right female breast: Secondary | ICD-10-CM | POA: Diagnosis not present

## 2020-05-02 DIAGNOSIS — D649 Anemia, unspecified: Secondary | ICD-10-CM | POA: Diagnosis not present

## 2020-05-02 DIAGNOSIS — D0512 Intraductal carcinoma in situ of left breast: Secondary | ICD-10-CM | POA: Diagnosis not present

## 2020-05-02 DIAGNOSIS — Z79899 Other long term (current) drug therapy: Secondary | ICD-10-CM | POA: Diagnosis not present

## 2020-05-02 DIAGNOSIS — Z171 Estrogen receptor negative status [ER-]: Secondary | ICD-10-CM | POA: Diagnosis not present

## 2020-05-02 LAB — CBC WITH DIFFERENTIAL/PLATELET
Abs Immature Granulocytes: 7 10*3/uL — ABNORMAL HIGH (ref 0.00–0.07)
Band Neutrophils: 15 %
Basophils Absolute: 0.2 10*3/uL — ABNORMAL HIGH (ref 0.0–0.1)
Basophils Relative: 1 %
Blasts: 1 %
Eosinophils Absolute: 0.2 10*3/uL (ref 0.0–0.5)
Eosinophils Relative: 1 %
HCT: 22.2 % — ABNORMAL LOW (ref 36.0–46.0)
Hemoglobin: 7.2 g/dL — ABNORMAL LOW (ref 12.0–15.0)
Lymphocytes Relative: 18 %
Lymphs Abs: 3.2 10*3/uL (ref 0.7–4.0)
MCH: 37.3 pg — ABNORMAL HIGH (ref 26.0–34.0)
MCHC: 32.4 g/dL (ref 30.0–36.0)
MCV: 115 fL — ABNORMAL HIGH (ref 80.0–100.0)
Metamyelocytes Relative: 11 %
Monocytes Absolute: 1.8 10*3/uL — ABNORMAL HIGH (ref 0.1–1.0)
Monocytes Relative: 10 %
Myelocytes: 28 %
Neutro Abs: 5.4 10*3/uL (ref 1.7–7.7)
Neutrophils Relative %: 15 %
Platelets: 179 10*3/uL (ref 150–400)
RBC: 1.93 MIL/uL — ABNORMAL LOW (ref 3.87–5.11)
RDW: 22.5 % — ABNORMAL HIGH (ref 11.5–15.5)
WBC: 17.9 10*3/uL — ABNORMAL HIGH (ref 4.0–10.5)
nRBC: 1.5 % — ABNORMAL HIGH (ref 0.0–0.2)

## 2020-05-03 ENCOUNTER — Other Ambulatory Visit: Payer: Self-pay

## 2020-05-03 ENCOUNTER — Encounter (HOSPITAL_COMMUNITY): Payer: Self-pay | Admitting: Hematology

## 2020-05-03 ENCOUNTER — Other Ambulatory Visit: Payer: Self-pay | Admitting: Hematology

## 2020-05-03 ENCOUNTER — Inpatient Hospital Stay: Payer: Medicare PPO

## 2020-05-03 DIAGNOSIS — D75839 Thrombocytosis, unspecified: Secondary | ICD-10-CM | POA: Diagnosis not present

## 2020-05-03 DIAGNOSIS — D0512 Intraductal carcinoma in situ of left breast: Secondary | ICD-10-CM | POA: Diagnosis not present

## 2020-05-03 DIAGNOSIS — D649 Anemia, unspecified: Secondary | ICD-10-CM

## 2020-05-03 DIAGNOSIS — C50211 Malignant neoplasm of upper-inner quadrant of right female breast: Secondary | ICD-10-CM | POA: Diagnosis not present

## 2020-05-03 DIAGNOSIS — Z7982 Long term (current) use of aspirin: Secondary | ICD-10-CM | POA: Diagnosis not present

## 2020-05-03 DIAGNOSIS — Z79899 Other long term (current) drug therapy: Secondary | ICD-10-CM | POA: Diagnosis not present

## 2020-05-03 DIAGNOSIS — Z170 Estrogen receptor positive status [ER+]: Secondary | ICD-10-CM | POA: Diagnosis not present

## 2020-05-03 DIAGNOSIS — Z171 Estrogen receptor negative status [ER-]: Secondary | ICD-10-CM | POA: Diagnosis not present

## 2020-05-03 LAB — SURGICAL PATHOLOGY

## 2020-05-03 LAB — PREPARE RBC (CROSSMATCH)

## 2020-05-04 ENCOUNTER — Other Ambulatory Visit: Payer: Self-pay | Admitting: Nurse Practitioner

## 2020-05-04 NOTE — Telephone Encounter (Signed)
Pharmacy requested refill.  Last OV note on 04/05/2020 states that patient should be taking Vesicare 10mg . The Pharmacy sent Vesicare 5mg , is it ok to change in patient medication list.  Please Advise.

## 2020-05-05 ENCOUNTER — Other Ambulatory Visit: Payer: Self-pay

## 2020-05-05 ENCOUNTER — Inpatient Hospital Stay: Payer: Medicare PPO

## 2020-05-05 DIAGNOSIS — Z170 Estrogen receptor positive status [ER+]: Secondary | ICD-10-CM | POA: Diagnosis not present

## 2020-05-05 DIAGNOSIS — Z79899 Other long term (current) drug therapy: Secondary | ICD-10-CM | POA: Diagnosis not present

## 2020-05-05 DIAGNOSIS — C50211 Malignant neoplasm of upper-inner quadrant of right female breast: Secondary | ICD-10-CM | POA: Diagnosis not present

## 2020-05-05 DIAGNOSIS — D0512 Intraductal carcinoma in situ of left breast: Secondary | ICD-10-CM | POA: Diagnosis not present

## 2020-05-05 DIAGNOSIS — D649 Anemia, unspecified: Secondary | ICD-10-CM | POA: Diagnosis not present

## 2020-05-05 DIAGNOSIS — Z171 Estrogen receptor negative status [ER-]: Secondary | ICD-10-CM | POA: Diagnosis not present

## 2020-05-05 DIAGNOSIS — D75839 Thrombocytosis, unspecified: Secondary | ICD-10-CM | POA: Diagnosis not present

## 2020-05-05 DIAGNOSIS — Z7982 Long term (current) use of aspirin: Secondary | ICD-10-CM | POA: Diagnosis not present

## 2020-05-05 MED ORDER — SODIUM CHLORIDE 0.9% IV SOLUTION
250.0000 mL | Freq: Once | INTRAVENOUS | Status: AC
  Administered 2020-05-05: 250 mL via INTRAVENOUS
  Filled 2020-05-05: qty 250

## 2020-05-05 NOTE — Patient Instructions (Signed)

## 2020-05-06 LAB — BPAM RBC
Blood Product Expiration Date: 202205132359
ISSUE DATE / TIME: 202204300913
Unit Type and Rh: 7300

## 2020-05-06 LAB — TYPE AND SCREEN
ABO/RH(D): B POS
Antibody Screen: NEGATIVE
Unit division: 0

## 2020-05-09 DIAGNOSIS — D469 Myelodysplastic syndrome, unspecified: Secondary | ICD-10-CM | POA: Insufficient documentation

## 2020-05-09 DIAGNOSIS — D649 Anemia, unspecified: Secondary | ICD-10-CM | POA: Insufficient documentation

## 2020-05-09 NOTE — Progress Notes (Incomplete)
[place]   [phone] [phone]   Clinic Follow up Note   Patient Care Team: Mast, [name], NP as PCP - General (Internal Medicine) [name], MD as Consulting Physician (Dermatology) [name], MD as Consulting Physician ([place]) Jovita Kussmaul, MD as Consulting Physician (General Surgery) Raye Sorrow, MD as Consulting Physician (Pulmonary Disease) Truitt Merle, MD as Consulting Physician (Hematology) Gery Pray, MD as Consulting Physician (Radiation Oncology) Delice Bison, Charlestine Massed, NP as Nurse Practitioner (Hematology and Oncology) Annia Belt, MD as Consulting Physician (Oncology) Mauro Kaufmann, RN as Oncology Nurse Navigator Rockwell Germany, RN as Oncology Nurse Navigator  Date of Service:  05/09/2020  CHIEF COMPLAINT: f/u of thrombocythemia, left breast DCIS  SUMMARY OF ONCOLOGIC HISTORY: Oncology History Overview Note  Cancer Staging Malignant neoplasm of upper-inner quadrant of right breast in female, estrogen receptor positive ([place]) Staging form: Breast, AJCC 8th Edition - Clinical stage from 01/07/2017: Stage IA (cT1c, cN0, cM0, G3, ER: Positive, PR: Positive, HER2: Negative) - Signed by Truitt Merle, MD on 01/20/2017  Malignant neoplasm of upper-outer quadrant of left breast in female, estrogen receptor positive ([place]) Staging form: Breast, AJCC 8th Edition - Clinical stage from 01/07/2017: Stage IA (cT1b, cN0, cM0, G2, ER: Positive, PR: Positive, HER2: Negative) - Signed by Truitt Merle, MD on 01/20/2017     Malignant neoplasm of upper-inner quadrant of right breast in female, estrogen receptor positive ([place])  01/01/2017 Mammogram   IMPRESSION: 1. 2.0 cm mass in the 2 o'clock position of the right breast with imaging features highly suspicious for malignancy. 2. 0.7 cm mass in the 2 o'clock position of the left breast with imaging features highly suspicious for malignancy. 3. No abnormal appearing  axillary lymph nodes on either side.   01/07/2017 Receptors her2   Right Breast: Prognostic indicators significant for: ER, 95% positive and PR, 50% positive, both with strong staining intensity. Proliferation marker Ki67 at 30. HER2 negative. RATIO OF HER2/CEP17 SIGNALS 0.81 AVERAGE HER2 COPY NUMBER PER CELL 1.70  Left Breast: Prognostic indicators significant for: ER, 100% positive and PR, 100% positive, both with strong staining intensity. Proliferation marker Ki67 at 10. HER2 negative. RATIO OF HER2/CEP17 SIGNALS 1.75 AVERAGE HER2 COPY NUMBER PER CELL 3.15   01/07/2017 Initial Biopsy   Diagnosis 1. Breast, right, needle core biopsy, 2:00 o'clock - INVASIVE DUCTAL CARCINOMA, SEE COMMENT. 2. Breast, left, needle core biopsy, 2:00 o'clock - INVASIVE MAMMARY CARCINOMA, SEE COMMENT.    01/20/2017 Initial Diagnosis   Malignant neoplasm of upper-inner quadrant of right breast in female, estrogen receptor positive ([place])   01/2017 - 03/2018 Anti-estrogen oral therapy   Anastrozole 1 mg daily started on 01/2017, stopped around 01/2018 due to diffuse joint pain. She switched to Exemestane on 03/26/2018 but stopped after 1 week due to gluteal pain.    02/20/2017 Surgery   RIGHT BREAST LUMPECTOMY (Right) LEFT BREAST LUMPECTOMY WITH RADIOACTIVE SEED LOCALIZATION (Left)  Per Dr. Marlou Starks    02/20/2017 Pathology Results    Diagnosis 1. Breast, lumpectomy, Left w/ seed - INVASIVE DUCTAL CARCINOMA, GRADE I/III, SPANNING 1.1 CM. - THE SURGICAL RESECTION MARGINS ARE NEGATIVE FOR CARCINOMA. - SEE ONCOLOGY TABLE BELOW. 2. Breast, lumpectomy, Right - INVASIVE DUCTAL CARCINOMA, GRADE II/III, SPANNING 2.1 CM. - DUCTAL CARCINOMA IN SITU, INTERMEDIATE GRADE. - PERINEURAL INVASION IS IDENTIFIED. - THE SURGICAL RESECTION MARGINS ARE NEGATIVE FOR CARCINOMA. - SEE ONCOLOGY TABLE BELOW. 3. Breast, excision, Right inferior - LOBULAR NEOPLASIA (ATYPICAL LOBULAR HYPERPLASIA). - FAT NECROSIS. -  SEE  COMMENT.  Microscopic Comment 1. BREAST, INVASIVE TUMOR (LEFT) Procedure: Seed localized lumpectomy Laterality: Left Tumor Size: 1.1 cm (gross measurement). Histologic Type: Ductal Grade: I Tubular Differentiation: 2 Nuclear Pleomorphism: 1 Mitotic Count: 1 Ductal Carcinoma in Situ (DCIS): Not identified Extent of Tumor: Confined to breast parenchyma. Margins: Greater than 0.2 cm to all margins. Regional Lymph Nodes: None examined. Breast Prognostic Profile: Case [id number] Estrogen Receptor: 100%, strong Progesterone Receptor: 100%, strong Her2: No amplification was detected. The ratio was 0.81 Ki-67: 10% Best tumor block for sendout testing: 1B Pathologic Stage Classification (pTNM, AJCC 8th Edition): Primary Tumor (pT): pT1c Regional Lymph Nodes (pN): pNX Distant Metastases (pM): pMX  2. BREAST, INVASIVE TUMOR (RIGHT) Procedure: Lumpectomy Laterality: Right Tumor Size: 2.1 cm (gross measurement) Histologic Type: Ductal Grade: II Tubular Differentiation: 3 Nuclear Pleomorphism: 2 Mitotic Count: 1 Ductal Carcinoma in Situ (DCIS): Present, intermediate grade Extent of Tumor: Confined to breast parenchyma. Margins: Greater than 0.2 cm to all margins Regional Lymph Nodes: None examined. Breast Prognostic Profile: Case [id number] Estrogen Receptor: 95%, strong Progesterone Receptor: 50%, strong Her2: No amplification was detected. The ratio was 0.81 Ki-67: 30% Best tumor block for sendout testing: 2A Pathologic Stage Classification (pTNM, AJCC 8th Edition): Primary Tumor (pT): pT2 Regional Lymph Nodes (pN): pNX Distant Metastases (pM): pMX 3. The surgical resection margin(s) of the specimen were inked and microscopically evaluated.   03/31/2017 - 05/11/2017 Radiation Therapy   1. Right Breast / 50 Gy in 25 fractions 2. Right Axilla / 45 Gy in 25 fractions 3. Right Breast Boost / 10 Gy in 5 fractions   Malignant neoplasm of upper-outer quadrant of left breast  in female, estrogen receptor positive ([place])  01/20/2017 Initial Diagnosis   Malignant neoplasm of upper-outer quadrant of left breast in female, estrogen receptor positive ([place])   02/20/2017 Surgery   RIGHT BREAST LUMPECTOMY (Right) LEFT BREAST LUMPECTOMY WITH RADIOACTIVE SEED LOCALIZATION (Left)  Per Dr. Marlou Starks     02/12/2018 Mammogram   IMPRESSION: No mammographic evidence of breast malignancy       CURRENT THERAPY:  ***  INTERVAL HISTORY: *** Jill Myers is here for a follow up of thrombocythemia and left breast DCIS. She was last seen by me on 04/17/20. She presents to the clinic {alone/accompanied by}.  REVIEW OF SYSTEMS:  *** Constitutional: Denies fevers, chills or abnormal weight loss Eyes: Denies blurriness of vision Ears, nose, mouth, throat, and face: Denies mucositis or sore throat Respiratory: Denies cough, dyspnea or wheezes Cardiovascular: Denies palpitation, chest discomfort or lower extremity swelling Gastrointestinal:  Denies nausea, heartburn or change in bowel habits Skin: Denies abnormal skin rashes Lymphatics: Denies new lymphadenopathy or easy bruising Neurological:Denies numbness, tingling or new weaknesses Behavioral/Psych: Mood is stable, no new changes  All other systems were reviewed with the patient and are negative.  MEDICAL HISTORY:  Past Medical History:  Diagnosis Date  . Bladder cystocele 05/21/2010  . Complication of anesthesia    deglutition in history  . Deglutition disorder 08/17/2012   Single episode of choking/aspiration; preceded by trouble swallowing saliva (thin liquids).  For SLP evaluation. Aug 17, 2012.    [name] Disease of blood and blood forming organ 01/05/2007   Qualifier: Diagnosis of  By: Oneida Alar MD, KARL    . Diverticulitis   . DIVERTICULOSIS OF COLON 03/05/2006   Qualifier: Diagnosis of  By: Samara Snide    . DIZZINESS 01/18/2010   Qualifier: Diagnosis of  By: Lindell Noe MD, Jeneen Rinks    . Essential hypertension,  benign  02/22/2008   Qualifier: Diagnosis of  By: Lindell Noe MD, Jeneen Rinks     . Essential thrombocythemia ([place]) 01/31/11  . Family history of Alzheimer's disease 11/11/2013   Patient with family history of Alzheimers disease. She herself is very highly functional.  Plans for future visit with MMSE as primary function of the visit.    [name] GERD (gastroesophageal reflux disease) 08/16/2010  . HALLUX VALGUS, ACQUIRED 04/10/2009   Qualifier: Diagnosis of  By: [name] CMA,, [place]    . Hypertension   . HYPERTRIGLYCERIDEMIA 01/05/2007   Qualifier: Diagnosis of  By: Oneida Alar MD, KARL    . Insomnia 02/10/2014  . Left-sided chest wall pain 12/19/2011    L sided chest wall pain follows a dermatomal distribution and raises the possibility of thoracic radiculopathic pain, which would be expected to improve with gabapentin as well. We discussed this at length and she will call to make me aware if this does not get better. Rib belt/girdle in the meantime; may consider rib films or CXR if not improving.     . Metatarsalgia of right foot 11/28/2013   Referral to [facility]   . Osteopenia   . Personal history of radiation therapy    03/2017 right breast  . Restless leg syndrome 06/09/2011  . Restless legs syndrome (RLS)   . URINARY INCONTINENCE, URGE, MILD 11/27/2009   Qualifier: Diagnosis of  By: Zebedee Iba NP, Manuela Schwartz    . Vitamin D deficiency 08/16/2010    SURGICAL HISTORY: Past Surgical History:  Procedure Laterality Date  . BIOPSY BREAST Left 1998   Dr. Rebekah Chesterfield  . BREAST EXCISIONAL BIOPSY    . BREAST LUMPECTOMY Right 02/20/2017   Procedure: RIGHT BREAST LUMPECTOMY;  Surgeon: Jovita Kussmaul, MD;  Location: [place];  Service: General;  Laterality: Right;  . BREAST LUMPECTOMY Left 02/20/2017   left lumpectomy  . BREAST LUMPECTOMY WITH RADIOACTIVE SEED LOCALIZATION Left 02/20/2017   Procedure: LEFT BREAST LUMPECTOMY WITH RADIOACTIVE SEED LOCALIZATION;  Surgeon: Jovita Kussmaul, MD;  Location: [place];  Service:  General;  Laterality: Left;  . CATARACT EXTRACTION  2010   Dr. [name]  . CHOLECYSTECTOMY  1992  . COLONOSCOPY  2008  . MOHS SURGERY  2012   on face [facility] [place]  . NAILBED REPAIR  12/05/2010   Procedure: NAILBED REPAIR;  Surgeon: Cammie Sickle., MD;  Location: [place];  Service: Orthopedics;  Laterality: Right;  full thickness nail biopsy right hallux  . torn tenden  2012   hand Dr. Orie Rout    I have reviewed the social history and family history with the patient and they are unchanged from previous note.  ALLERGIES:  is allergic to doxycycline, macrodantin [nitrofurantoin], and sulfamethoxazole-trimethoprim.  MEDICATIONS:  Current Outpatient Medications  Medication Sig Dispense Refill  . aspirin 81 MG tablet Take 81 mg by mouth daily.    . calcium carbonate (OSCAL) 1500 (600 Ca) MG TABS tablet Take 600 mg of elemental calcium by mouth in the morning and at bedtime.     . cholecalciferol (VITAMIN D) 1000 UNITS tablet Take 2,000 Units by mouth daily.     [name] gabapentin (NEURONTIN) 300 MG capsule TAKE 1 CAPSULE BY MOUTH EVERY MORNING AND TAKE 3 CAPSULES BY MOUTH EVERY EVENING 360 capsule 1  . GLUCOSAMINE-CHONDROITIN PO Take by mouth. 1500-1200  Take two tablet daily    . hydrochlorothiazide (HYDRODIURIL) 25 MG tablet TAKE 1 TABLET BY MOUTH EVERY DAY 90 tablet 1  .  ibuprofen (ADVIL,MOTRIN) 200 MG tablet Take 400 mg by mouth as needed.     . Magnesium 500 MG TABS Take 1 tablet by mouth daily.    . metoprolol tartrate (LOPRESSOR) 25 MG tablet TAKE 1/2 TABLET BY MOUTH TWICE A DAY 90 tablet 1  . Omega-3 Fatty Acids (FISH OIL PO) Take by mouth.    [name] omeprazole (PRILOSEC) 20 MG capsule TAKE 1 CAPSULE BY MOUTH EVERY DAY 90 capsule 1  . polyethylene glycol (MIRALAX / GLYCOLAX) packet Take 17 g by mouth daily.    . potassium chloride SA (KLOR-CON) 20 MEQ tablet Take 20 mEq by mouth daily.    [name] pyridOXINE (VITAMIN B-6) 100 MG tablet Take 100 mg by mouth  daily.    . solifenacin (VESICARE) 10 MG tablet TAKE 1 TABLET BY MOUTH EVERY DAY 90 tablet 1  . solifenacin (VESICARE) 5 MG tablet Take 5 mg by mouth daily.     No current facility-administered medications for this visit.    PHYSICAL EXAMINATION: ECOG PERFORMANCE STATUS: {CHL ONC ECOG [phone]}  There were no vitals filed for this visit. There were no vitals filed for this visit. *** GENERAL:alert, no distress and comfortable SKIN: skin color, texture, turgor are normal, no rashes or significant lesions EYES: normal, Conjunctiva are pink and non-injected, sclera clear {OROPHARYNX:no exudate, no erythema and lips, buccal mucosa, and tongue normal}  NECK: supple, thyroid normal size, non-tender, without nodularity LYMPH:  no palpable lymphadenopathy in the cervical, axillary {or inguinal} LUNGS: clear to auscultation and percussion with normal breathing effort HEART: regular rate & rhythm and no murmurs and no lower extremity edema ABDOMEN:abdomen soft, non-tender and normal bowel sounds Musculoskeletal:no cyanosis of digits and no clubbing  NEURO: alert & oriented x 3 with fluent speech, no focal motor/sensory deficits  LABORATORY DATA:  I have reviewed the data as listed CBC Latest Ref Rng & Units 05/02/2020 04/17/2020 04/12/2020  WBC 4.0 - 10.5 K/uL 17.9(H) 13.8(H) 11.4(H)  Hemoglobin 12.0 - 15.0 g/dL 7.2(L) 8.5(L) 8.6(L)  Hematocrit 36.0 - 46.0 % 22.2(L) 26.0(L) 26.9(L)  Platelets 150 - 400 K/uL 179 219 170     CMP Latest Ref Rng & Units 04/12/2020 02/22/2020 10/10/2019  Glucose 70 - 99 mg/dL 82 84 95  BUN 8 - 23 mg/dL _0 Creatinine 0.44 - 1.00 mg/dL 0.80 0.78 0.73  Sodium 135 - 145 mmol/L 131(L) 131(L) 129(L)  Potassium 3.5 - 5.1 mmol/L 3.8 3.3(L) 4.0  Chloride 98 - 111 mmol/L 95(L) 98 95(L)  CO2 22 - 32 mmol/L _1 Calcium 8.9 - 10.3 mg/dL 8.7(L) 8.4(L) 9.0  Total Protein 6.5 - 8.1 g/dL 6.2(L) 6.4(L) 6.5  Total Bilirubin 0.3 - 1.2 mg/dL 0.8 0.7 0.6  Alkaline  Phos 38 - 126 U/L 65 42 43  AST 15 - 41 U/L _2 ALT 0 - 44 U/L _3 RADIOGRAPHIC STUDIES: I have personally reviewed the radiological images as listed and agreed with the findings in the report. No results found.   ASSESSMENT & PLAN: *** Jacklyne Keliyah Spillman is a 87 y.o. female with   1.  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 Stopped on 03/07/20 due to moderate anemia -Patient developed moderate symptomatic anemia required blood transfusion in March 2022 -Bone marrow biopsy on 04/12/20 showed 5 to 10% blasts, with left shift of marrow cells.  Bone marrow is  significantly hypercellular (>90%).  This is concerning for MPN evolving to preleukemia, such as MDS. ***   Will send MDS FISH panel to see if she has 5 q. Deletion -Cytogenetics still pending -I discussed she may develop acute leukemia down the road.  Her moderate anemia is related to the disease progression of her ET.  -I discussed management option of her anemia, including blood transfusion, nutritional anemia, and growth factor Epogen.  Patient voiced good understanding and agrees with plan  -We discussed that secondary MDS is more difficult to treat, and unlikely curable. -Lab reviewed, hemoglobin 8.5 today, no need for change.  plt normal  -We will repeat labs every 2 weeks -Follow-up in 4 weeks  2. Newly diagnosed left breast DCIS, high grade , ER-/PR- -Diagnosed 03/27/20 -surgery is on hold due to her recent anemia -I recommend surgery in 2 to 3 months when her anemia is improved  3.Bilateral breast cancer, invasive ductal carcinoma, right upper inner quadrant, pT2N0M0, stage IA, ER+/PR+/HER2-, G3, left upper-outer quadrant,pT1cN0M0, stage IA, ER+/PR+/HER2-, G2 -She was diagnosed on 01/20/2017, and is s/pbilateral lumpectomy with negative margins,followed by adjuvant right breastand axillaryradiation. -Shetried anastrozole  and Exemestane from 01/2017-01/2018 but stopped due to MSK pain. -Continue cancer surveillance   Plan ***   -Bone marrow biopsy results reviewed with patient and her daughter -Lab in 2 weeks, consider blood transfusion if hemoglobin less than 8.0, or symptomatic anemia with hemoglobin less than 8.5 -Lab and follow-up in 4 weeks -will check insurance approval for EPO    ***  No problem-specific Assessment & Plan notes found for this encounter.   No orders of the defined types were placed in this encounter.  All questions were answered. The patient knows to call the clinic with any problems, questions or concerns. No barriers to learning was detected. The total time spent in the appointment was {CHL ONC TIME VISIT - SUORV:[id number]}.     Aurea Graff 05/09/2020   I, Wilburn Mylar, am acting as scribe for Truitt Merle, MD.   {Add scribe attestation statement}

## 2020-05-10 ENCOUNTER — Emergency Department (HOSPITAL_COMMUNITY): Payer: Medicare PPO

## 2020-05-10 ENCOUNTER — Observation Stay (HOSPITAL_COMMUNITY)
Admission: EM | Admit: 2020-05-10 | Discharge: 2020-05-12 | Disposition: A | Discharge status: Home / Self Care | Payer: Medicare PPO | Attending: Internal Medicine | Admitting: Internal Medicine

## 2020-05-10 ENCOUNTER — Encounter (HOSPITAL_COMMUNITY): Payer: Self-pay

## 2020-05-10 ENCOUNTER — Other Ambulatory Visit: Payer: Self-pay

## 2020-05-10 ENCOUNTER — Ambulatory Visit: Payer: Medicare PPO | Admitting: Hematology

## 2020-05-10 ENCOUNTER — Ambulatory Visit: Payer: Medicare PPO

## 2020-05-10 ENCOUNTER — Other Ambulatory Visit: Payer: Medicare PPO

## 2020-05-10 DIAGNOSIS — C50412 Malignant neoplasm of upper-outer quadrant of left female breast: Secondary | ICD-10-CM

## 2020-05-10 DIAGNOSIS — R0902 Hypoxemia: Secondary | ICD-10-CM | POA: Diagnosis not present

## 2020-05-10 DIAGNOSIS — D649 Anemia, unspecified: Secondary | ICD-10-CM

## 2020-05-10 DIAGNOSIS — Z7982 Long term (current) use of aspirin: Secondary | ICD-10-CM | POA: Insufficient documentation

## 2020-05-10 DIAGNOSIS — J9811 Atelectasis: Secondary | ICD-10-CM | POA: Diagnosis not present

## 2020-05-10 DIAGNOSIS — D469 Myelodysplastic syndrome, unspecified: Secondary | ICD-10-CM

## 2020-05-10 DIAGNOSIS — I10 Essential (primary) hypertension: Secondary | ICD-10-CM | POA: Diagnosis not present

## 2020-05-10 DIAGNOSIS — Z853 Personal history of malignant neoplasm of breast: Secondary | ICD-10-CM | POA: Diagnosis not present

## 2020-05-10 DIAGNOSIS — R0789 Other chest pain: Secondary | ICD-10-CM | POA: Diagnosis not present

## 2020-05-10 DIAGNOSIS — Z87891 Personal history of nicotine dependence: Secondary | ICD-10-CM | POA: Diagnosis not present

## 2020-05-10 DIAGNOSIS — J90 Pleural effusion, not elsewhere classified: Secondary | ICD-10-CM | POA: Insufficient documentation

## 2020-05-10 DIAGNOSIS — K219 Gastro-esophageal reflux disease without esophagitis: Secondary | ICD-10-CM | POA: Diagnosis present

## 2020-05-10 DIAGNOSIS — J9601 Acute respiratory failure with hypoxia: Secondary | ICD-10-CM | POA: Diagnosis not present

## 2020-05-10 DIAGNOSIS — C50211 Malignant neoplasm of upper-inner quadrant of right female breast: Secondary | ICD-10-CM

## 2020-05-10 DIAGNOSIS — E876 Hypokalemia: Secondary | ICD-10-CM | POA: Insufficient documentation

## 2020-05-10 DIAGNOSIS — D630 Anemia in neoplastic disease: Secondary | ICD-10-CM | POA: Diagnosis not present

## 2020-05-10 DIAGNOSIS — Z79899 Other long term (current) drug therapy: Secondary | ICD-10-CM | POA: Insufficient documentation

## 2020-05-10 DIAGNOSIS — R911 Solitary pulmonary nodule: Secondary | ICD-10-CM | POA: Diagnosis not present

## 2020-05-10 DIAGNOSIS — R262 Difficulty in walking, not elsewhere classified: Secondary | ICD-10-CM | POA: Insufficient documentation

## 2020-05-10 DIAGNOSIS — R079 Chest pain, unspecified: Secondary | ICD-10-CM | POA: Diagnosis not present

## 2020-05-10 DIAGNOSIS — Z20822 Contact with and (suspected) exposure to covid-19: Secondary | ICD-10-CM | POA: Diagnosis not present

## 2020-05-10 DIAGNOSIS — D473 Essential (hemorrhagic) thrombocythemia: Secondary | ICD-10-CM

## 2020-05-10 LAB — COMPREHENSIVE METABOLIC PANEL
ALT: 30 U/L (ref 0–44)
AST: 31 U/L (ref 15–41)
Albumin: 3.7 g/dL (ref 3.5–5.0)
Alkaline Phosphatase: 77 U/L (ref 38–126)
Anion gap: 8 (ref 5–15)
BUN: 17 mg/dL (ref 8–23)
CO2: 26 mmol/L (ref 22–32)
Calcium: 8.4 mg/dL — ABNORMAL LOW (ref 8.9–10.3)
Chloride: 94 mmol/L — ABNORMAL LOW (ref 98–111)
Creatinine, Ser: 0.7 mg/dL (ref 0.44–1.00)
GFR, Estimated: >60 mL/min (ref >60)
Glucose, Bld: 101 mg/dL — ABNORMAL HIGH (ref 70–99)
Potassium: 3.5 mmol/L (ref 3.5–5.1)
Sodium: 128 mmol/L — ABNORMAL LOW (ref 135–145)
Total Bilirubin: 0.5 mg/dL (ref 0.3–1.2)
Total Protein: 6.2 g/dL — ABNORMAL LOW (ref 6.5–8.1)

## 2020-05-10 LAB — CBC
HCT: 26.5 % — ABNORMAL LOW (ref 36.0–46.0)
Hemoglobin: 8.6 g/dL — ABNORMAL LOW (ref 12.0–15.0)
MCH: 35.5 pg — ABNORMAL HIGH (ref 26.0–34.0)
MCHC: 32.5 g/dL (ref 30.0–36.0)
MCV: 109.5 fL — ABNORMAL HIGH (ref 80.0–100.0)
Platelets: 156 10*3/uL (ref 150–400)
RBC: 2.42 MIL/uL — ABNORMAL LOW (ref 3.87–5.11)
RDW: 23 % — ABNORMAL HIGH (ref 11.5–15.5)
WBC: 16.4 10*3/uL — ABNORMAL HIGH (ref 4.0–10.5)
nRBC: 1 % — ABNORMAL HIGH (ref 0.0–0.2)

## 2020-05-10 LAB — URINALYSIS, ROUTINE W REFLEX MICROSCOPIC
Bilirubin Urine: NEGATIVE
Glucose, UA: NEGATIVE mg/dL
Hgb urine dipstick: NEGATIVE
Ketones, ur: NEGATIVE mg/dL
Leukocytes,Ua: NEGATIVE
Nitrite: NEGATIVE
Protein, ur: NEGATIVE mg/dL
Specific Gravity, Urine: 1.019 (ref 1.005–1.030)
pH: 7 (ref 5.0–8.0)

## 2020-05-10 LAB — TROPONIN I (HIGH SENSITIVITY)
Troponin I (High Sensitivity): 3 ng/L (ref <18)
Troponin I (High Sensitivity): 3 ng/L (ref <18)

## 2020-05-10 MED ORDER — POTASSIUM CHLORIDE CRYS ER 20 MEQ PO TBCR
20.0000 meq | EXTENDED_RELEASE_TABLET | Freq: Every day | ORAL | Status: DC
  Administered 2020-05-11: 20 meq via ORAL
  Filled 2020-05-10: qty 1

## 2020-05-10 MED ORDER — ASPIRIN EC 81 MG PO TBEC
81.0000 mg | DELAYED_RELEASE_TABLET | Freq: Every day | ORAL | Status: DC
  Administered 2020-05-11 – 2020-05-12 (×2): 81 mg via ORAL
  Filled 2020-05-10 (×2): qty 1

## 2020-05-10 MED ORDER — IOHEXOL 350 MG/ML SOLN
75.0000 mL | Freq: Once | INTRAVENOUS | Status: AC | PRN
  Administered 2020-05-10: 75 mL via INTRAVENOUS

## 2020-05-10 MED ORDER — VITAMIN D 25 MCG (1000 UNIT) PO TABS
2000.0000 [IU] | ORAL_TABLET | Freq: Every day | ORAL | Status: DC
  Administered 2020-05-11 – 2020-05-12 (×2): 2000 [IU] via ORAL
  Filled 2020-05-10 (×2): qty 2

## 2020-05-10 MED ORDER — IBUPROFEN 200 MG PO TABS
400.0000 mg | ORAL_TABLET | Freq: Four times a day (QID) | ORAL | Status: DC | PRN
  Administered 2020-05-10: 400 mg via ORAL
  Filled 2020-05-10: qty 2

## 2020-05-10 MED ORDER — MORPHINE SULFATE (PF) 2 MG/ML IV SOLN
2.0000 mg | Freq: Once | INTRAVENOUS | Status: AC
Start: 2020-05-10 — End: 2020-05-10
  Administered 2020-05-10: 2 mg via INTRAVENOUS
  Filled 2020-05-10: qty 1

## 2020-05-10 MED ORDER — CALCIUM CARBONATE 1250 (500 CA) MG PO TABS
1.0000 | ORAL_TABLET | Freq: Every day | ORAL | Status: DC
  Administered 2020-05-11 – 2020-05-12 (×2): 500 mg via ORAL
  Filled 2020-05-10 (×2): qty 1

## 2020-05-10 MED ORDER — OXYCODONE HCL 5 MG PO TABS
5.0000 mg | ORAL_TABLET | ORAL | Status: DC | PRN
Start: 2020-05-10 — End: 2020-05-12
  Administered 2020-05-10 – 2020-05-11 (×3): 5 mg via ORAL
  Filled 2020-05-10 (×3): qty 1

## 2020-05-10 MED ORDER — ALBUTEROL SULFATE (2.5 MG/3ML) 0.083% IN NEBU: 2.5000 mg | INHALATION_SOLUTION | RESPIRATORY_TRACT | Status: DC | PRN

## 2020-05-10 MED ORDER — METOPROLOL TARTRATE 25 MG PO TABS
12.5000 mg | ORAL_TABLET | Freq: Two times a day (BID) | ORAL | Status: DC
  Administered 2020-05-10 – 2020-05-12 (×4): 12.5 mg via ORAL
  Filled 2020-05-10 (×4): qty 1

## 2020-05-10 MED ORDER — VITAMIN B-6 100 MG PO TABS
100.0000 mg | ORAL_TABLET | Freq: Every day | ORAL | Status: DC
  Administered 2020-05-11 – 2020-05-12 (×2): 100 mg via ORAL
  Filled 2020-05-10 (×2): qty 1

## 2020-05-10 MED ORDER — MAGNESIUM GLUCONATE 500 MG PO TABS
500.0000 mg | ORAL_TABLET | Freq: Every day | ORAL | Status: DC
  Administered 2020-05-11 – 2020-05-12 (×2): 500 mg via ORAL
  Filled 2020-05-10 (×2): qty 1

## 2020-05-10 MED ORDER — PANTOPRAZOLE SODIUM 40 MG PO TBEC
40.0000 mg | DELAYED_RELEASE_TABLET | Freq: Every day | ORAL | Status: DC
  Administered 2020-05-11 – 2020-05-12 (×2): 40 mg via ORAL
  Filled 2020-05-10 (×2): qty 1

## 2020-05-10 MED ORDER — POLYETHYLENE GLYCOL 3350 17 G PO PACK
17.0000 g | PACK | Freq: Every day | ORAL | Status: DC
  Administered 2020-05-10 – 2020-05-11 (×2): 17 g via ORAL
  Filled 2020-05-10 (×4): qty 1

## 2020-05-10 MED ORDER — EPOETIN ALFA 10000 UNIT/ML IJ SOLN
10000.0000 [IU] | Freq: Once | INTRAMUSCULAR | Status: AC
  Administered 2020-05-11: 10000 [IU] via SUBCUTANEOUS
  Filled 2020-05-10: qty 1

## 2020-05-10 MED ORDER — GABAPENTIN 300 MG PO CAPS
900.0000 mg | ORAL_CAPSULE | Freq: Every day | ORAL | Status: DC
  Administered 2020-05-10 – 2020-05-11 (×2): 900 mg via ORAL
  Filled 2020-05-10 (×2): qty 3

## 2020-05-10 MED ORDER — GABAPENTIN 300 MG PO CAPS
300.0000 mg | ORAL_CAPSULE | Freq: Every day | ORAL | Status: DC
  Administered 2020-05-11 – 2020-05-12 (×2): 300 mg via ORAL
  Filled 2020-05-10 (×2): qty 1

## 2020-05-10 NOTE — H&P (Signed)
History and Physical    Jill Myers FYB:[id number] DOB: 26-Jun-1930 DOA: 05/10/2020  PCP: Mast, [name], NP  Patient coming from: Home, independent living  I have personally briefly reviewed patient's old medical records available.   Chief Complaint: Right-sided chest pain, shortness of breath for 4 days  HPI: Jill Myers is a 87 y.o. female with medical history significant of myelodysplastic syndrome, chronic anemia, hypertension, GERD presented to the emergency room with gradual onset and worsening right lateral chest wall pain.  According to the patient it is started about 4 days ago, no injury, moderate in intensity, occasionally 9/10 on movement, exacerbated by deep breathing.  Sharp in nature and worsening every day.  Patient has been followed by oncology with concerns for possible myeloproliferative neoplasm evolving into leukemia.  She was supposed to follow-up today, her pain was significant so came to ER instead. Denies any abdominal pain, nausea, vomiting.  Appetite is overall poor.  She is gradually with worsening strength.  She has a history of breast cancer and treated with lumpectomy bilateral. ED Course: Blood pressure is stable.  On room air at rest, short of breath and saturation less than 88% on mobility on room air needing 2 L oxygen.  Hypochloremic hyponatremia.  Hemoglobin 8.6 at about baseline.  WBC 16.4 with no evidence of active infection. Chest x-ray essentially normal. CTA with no evidence of pulmonary embolism, no evidence of fractures.  9 Millimeters right upper lobe nodule. Patient was given dose of morphine for pain, ambulated and became hypoxic.  Also discussed with oncology who is recommending palliative care consultation, symptom management and discharge home hence observation in the hospital.  Review of Systems: all systems are reviewed and pertinent positive as per HPI otherwise rest are negative.    Past Medical History:  Diagnosis Date  .  Bladder cystocele 05/21/2010  . Complication of anesthesia    deglutition in history  . Deglutition disorder 08/17/2012   Single episode of choking/aspiration; preceded by trouble swallowing saliva (thin liquids).  For SLP evaluation. Aug 17, 2012.    [name] Disease of blood and blood forming organ 01/05/2007   Qualifier: Diagnosis of  By: Oneida Alar MD, KARL    . Diverticulitis   . DIVERTICULOSIS OF COLON 03/05/2006   Qualifier: Diagnosis of  By: Samara Snide    . DIZZINESS 01/18/2010   Qualifier: Diagnosis of  By: Lindell Noe MD, Jeneen Rinks    . Essential hypertension, benign 02/22/2008   Qualifier: Diagnosis of  By: Lindell Noe MD, Jeneen Rinks     . Essential thrombocythemia ([place]) 01/31/11  . Family history of Alzheimer's disease 11/11/2013   Patient with family history of Alzheimers disease. She herself is very highly functional.  Plans for future visit with MMSE as primary function of the visit.    [name] GERD (gastroesophageal reflux disease) 08/16/2010  . HALLUX VALGUS, ACQUIRED 04/10/2009   Qualifier: Diagnosis of  By: [name] CMA,, [place]    . Hypertension   . HYPERTRIGLYCERIDEMIA 01/05/2007   Qualifier: Diagnosis of  By: Oneida Alar MD, KARL    . Insomnia 02/10/2014  . Left-sided chest wall pain 12/19/2011    L sided chest wall pain follows a dermatomal distribution and raises the possibility of thoracic radiculopathic pain, which would be expected to improve with gabapentin as well. We discussed this at length and she will call to make me aware if this does not get better. Rib belt/girdle in the meantime; may consider rib films or CXR if not improving.     [name]  Metatarsalgia of right foot 11/28/2013   Referral to [facility]   . Osteopenia   . Personal history of radiation therapy    03/2017 right breast  . Restless leg syndrome 06/09/2011  . Restless legs syndrome (RLS)   . URINARY INCONTINENCE, URGE, MILD 11/27/2009   Qualifier: Diagnosis of  By: Zebedee Iba NP, Manuela Schwartz    . Vitamin D deficiency 08/16/2010    Past Surgical History:  Procedure  Laterality Date  . BIOPSY BREAST Left 1998   Dr. Rebekah Chesterfield  . BREAST EXCISIONAL BIOPSY    . BREAST LUMPECTOMY Right 02/20/2017   Procedure: RIGHT BREAST LUMPECTOMY;  Surgeon: Jovita Kussmaul, MD;  Location: [place];  Service: General;  Laterality: Right;  . BREAST LUMPECTOMY Left 02/20/2017   left lumpectomy  . BREAST LUMPECTOMY WITH RADIOACTIVE SEED LOCALIZATION Left 02/20/2017   Procedure: LEFT BREAST LUMPECTOMY WITH RADIOACTIVE SEED LOCALIZATION;  Surgeon: Jovita Kussmaul, MD;  Location: [place];  Service: General;  Laterality: Left;  . CATARACT EXTRACTION  2010   Dr. [name]  . CHOLECYSTECTOMY  1992  . COLONOSCOPY  2008  . MOHS SURGERY  2012   on face [facility] [place]  . NAILBED REPAIR  12/05/2010   Procedure: NAILBED REPAIR;  Surgeon: Cammie Sickle., MD;  Location: [place];  Service: Orthopedics;  Laterality: Right;  full thickness nail biopsy right hallux  . torn tenden  2012   hand Dr. Orie Rout    Social history   reports that she quit smoking about 64 years ago. She quit after 3.00 years of use. She has never used smokeless tobacco. She reports current alcohol use. She reports that she does not use drugs.  Allergies  Allergen Reactions  . Doxycycline Nausea And Vomiting  . Macrodantin [Nitrofurantoin] Nausea And Vomiting  . Sulfamethoxazole-Trimethoprim Rash    REACTION: Rash after completing course of Septra    Family History  Problem Relation Age of Onset  . Heart disease Mother   . Tuberculosis Father   . Heart disease Sister   . Heart disease Brother   . Alzheimer's disease Brother   . Alzheimer's disease Brother      Prior to Admission medications   Medication Sig Start Date End Date Taking? Authorizing Provider  aspirin 81 MG tablet Take 81 mg by mouth daily.   Yes [provider]  calcium carbonate (OSCAL) 1500 (600 Ca) MG TABS tablet Take 600 mg of elemental calcium by mouth in the  morning and at bedtime.    Yes [provider]  cholecalciferol (VITAMIN D) 1000 UNITS tablet Take 2,000 Units by mouth daily.  03/17/11  Yes [provider]  gabapentin (NEURONTIN) 300 MG capsule TAKE 1 CAPSULE BY MOUTH EVERY MORNING AND TAKE 3 CAPSULES BY MOUTH EVERY EVENING Patient taking differently: Take 300-900 mg by mouth See admin instructions. TAKE 300mg  BY MOUTH EVERY MORNING AND TAKE 900mg  BY MOUTH EVERY EVENING 04/02/20  Yes Mast, [name], NP  GLUCOSAMINE-CHONDROITIN PO Take 2 tablets by mouth daily. 1500-1200 03/17/11  Yes [provider]  hydrochlorothiazide (HYDRODIURIL) 25 MG tablet TAKE 1 TABLET BY MOUTH EVERY DAY Patient taking differently: Take 25 mg by mouth daily. 02/06/20  Yes Mast, [name], NP  ibuprofen (ADVIL,MOTRIN) 200 MG tablet Take 400 mg by mouth as needed for fever, headache or mild pain. 03/17/11  Yes [provider]  Magnesium 500 MG TABS Take 1 tablet by mouth daily.   Yes [provider]  metoprolol tartrate (LOPRESSOR) 25 MG tablet TAKE 1/2 TABLET BY MOUTH TWICE A DAY Patient taking differently: Take 12.5 mg by mouth 2 (two) times daily. 01/17/20  Yes Mast, [name], NP  Omega-3 Fatty Acids (FISH OIL PO) Take 1 capsule by mouth daily.   Yes [provider]  omeprazole (PRILOSEC) 20 MG capsule TAKE 1 CAPSULE BY MOUTH EVERY DAY Patient taking differently: Take 20 mg by mouth daily. 01/04/20  Yes Mast, [name], NP  polyethylene glycol (MIRALAX / GLYCOLAX) packet Take 17 g by mouth daily.   Yes [provider]  potassium chloride SA (KLOR-CON) 20 MEQ tablet Take 20 mEq by mouth daily.   Yes [provider]  pyridOXINE (VITAMIN B-6) 100 MG tablet Take 100 mg by mouth daily.   Yes [provider]  solifenacin (VESICARE) 10 MG tablet TAKE 1 TABLET BY MOUTH EVERY DAY Patient taking differently: Take 5 mg by mouth daily. 05/04/20  Yes Mast, [name], NP    Physical Exam: Vitals:   05/10/20 1230 05/10/20  1300 05/10/20 1404 05/10/20 1519  BP: 130/67 (!) 160/70 (!) 171/82   Pulse: 76 74 81   Resp: (!) 22 18 15    Temp:    98.2 F (36.8 C)  TempSrc:    Oral  SpO2: 92% 96% 95%   Weight:      Height:        Constitutional: Chronically sick looking.  Frail and debilitated.  Not in any distress but anxious with moderate pain. Vitals:   05/10/20 1230 05/10/20 1300 05/10/20 1404 05/10/20 1519  BP: 130/67 (!) 160/70 (!) 171/82   Pulse: 76 74 81   Resp: (!) 22 18 15    Temp:    98.2 F (36.8 C)  TempSrc:    Oral  SpO2: 92% 96% 95%   Weight:      Height:       Eyes: PERRL, lids and conjunctivae normal ENMT: Mucous membranes are moist. Posterior pharynx clear of any exudate or lesions.Normal dentition.  Neck: normal, supple, no masses, no thyromegaly Respiratory: clear to auscultation bilaterally, no wheezing, no crackles. Normal respiratory effort. No accessory muscle use.  No added sounds.  No palpable fractures or pain. Cardiovascular: Regular rate and rhythm, no murmurs / rubs / gallops. No extremity edema. 2+ pedal pulses. No carotid bruits.  Abdomen: no tenderness, no masses palpated. No hepatosplenomegaly. Bowel sounds positive.  Musculoskeletal: no clubbing / cyanosis. No joint deformity upper and lower extremities. Good ROM, no contractures. Normal muscle tone.  Skin: no rashes, lesions, ulcers. No induration Neurologic: CN 2-12 grossly intact. Sensation intact, DTR normal. Strength 5/5 in all 4.  Psychiatric: Normal judgment and insight. Alert and oriented x 3. Normal mood.     Labs on Admission: I have personally reviewed following labs and imaging studies  CBC: Recent Labs  Lab 05/10/20 0906  WBC 16.4*  HGB 8.6*  HCT 26.5*  MCV 109.5*  PLT 426   Basic Metabolic Panel: Recent Labs  Lab 05/10/20 0906  NA 128*  K 3.5  CL 94*  CO2 26  GLUCOSE 101*  BUN 17  CREATININE 0.70  CALCIUM 8.4*   GFR: Estimated Creatinine Clearance: 41 mL/min (by C-G formula based on  SCr of 0.7 mg/dL). Liver Function Tests: Recent Labs  Lab 05/10/20 0906  AST 31  ALT 30  ALKPHOS 77  BILITOT 0.5  PROT 6.2*  ALBUMIN 3.7   No results for input(s): LIPASE, AMYLASE in the last 168 hours.  No results for input(s): AMMONIA in the last 168 hours. Coagulation Profile: No results for input(s): INR, PROTIME in the last 168 hours. Cardiac Enzymes: No results for input(s): CKTOTAL, CKMB, CKMBINDEX, TROPONINI in the last 168 hours. BNP (last 3 results) No results for input(s): PROBNP in the last 8760 hours. HbA1C: No results for input(s): HGBA1C in the last 72 hours. CBG: No results for input(s): GLUCAP in the last 168 hours. Lipid Profile: No results for input(s): CHOL, HDL, LDLCALC, TRIG, CHOLHDL, LDLDIRECT in the last 72 hours. Thyroid Function Tests: No results for input(s): TSH, T4TOTAL, FREET4, T3FREE, THYROIDAB in the last 72 hours. Anemia Panel: No results for input(s): VITAMINB12, FOLATE, FERRITIN, TIBC, IRON, RETICCTPCT in the last 72 hours. Urine analysis:    Component Value Date/Time   COLORURINE STRAW (A) 05/10/2020 1243   APPEARANCEUR CLEAR 05/10/2020 1243   LABSPEC 1.019 05/10/2020 1243   PHURINE 7.0 05/10/2020 1243   GLUCOSEU NEGATIVE 05/10/2020 1243   HGBUR NEGATIVE 05/10/2020 1243   HGBUR small 01/18/2010 1456   BILIRUBINUR NEGATIVE 05/10/2020 1243   BILIRUBINUR Negative 03/12/2020 [place] 05/10/2020 1243   PROTEINUR NEGATIVE 05/10/2020 1243   UROBILINOGEN negative (A) 03/12/2020 1437   UROBILINOGEN 0.2 01/18/2010 1456   NITRITE NEGATIVE 05/10/2020 1243   LEUKOCYTESUR NEGATIVE 05/10/2020 1243    Radiological Exams on Admission: DG Ribs Unilateral W/Chest Right  Result Date: 05/10/2020 CLINICAL DATA:  right rib/chest pain EXAM: RIGHT RIBS AND CHEST - 3+ VIEW COMPARISON:  None. FINDINGS: No evidence of displaced rib fracture. There is no evidence of pneumothorax. There is a small right pleural effusion. Both lungs are clear.  Heart size and mediastinal contours are within normal limits. IMPRESSION: Small right pleural effusion. No evidence of displaced rib fracture. Electronically Signed   By: Maurine Simmering   On: 05/10/2020 09:40   CT Angio Chest PE W/Cm &/Or Wo Cm  Result Date: 05/10/2020 CLINICAL DATA:  Right-sided chest pain for several days, initial encounter EXAM: CT ANGIOGRAPHY CHEST WITH CONTRAST TECHNIQUE: Multidetector CT imaging of the chest was performed using the standard protocol during bolus administration of intravenous contrast. Multiplanar CT image reconstructions and MIPs were obtained to evaluate the vascular anatomy. CONTRAST:  35mL OMNIPAQUE IOHEXOL 350 MG/ML SOLN COMPARISON:  Chest x-ray from earlier in the same day. FINDINGS: Cardiovascular: Thoracic aorta demonstrates atherosclerotic calcifications without aneurysmal dilatation. No significant enhancement is noted to rule out dissection. Heart is at the upper limits of normal in size. The pulmonary artery shows a normal branching pattern without definitive filling defect to suggest pulmonary embolism. Mediastinum/Nodes: Thoracic inlet is within normal limits. No sizable hilar or mediastinal adenopathy is noted. The esophagus is within normal limits. Lungs/Pleura: Bibasilar atelectatic changes are noted right greater than left with associated right-sided pleural effusion. Somewhat lobulated nodular density is noted measuring 9 mm in the anterior aspect of the right upper lobe best seen on image number 46 of series 6. No other sizable nodules are seen. Upper Abdomen: Scattered prominent cysts are noted in the central portion of the liver measuring up to 3.8 cm. Musculoskeletal: No chest wall abnormality. No acute or significant osseous findings. Review of the MIP images confirms the above findings. IMPRESSION: No evidence of pulmonary emboli. Bibasilar atelectasis with small right-sided pleural effusion. 9 mm lobulated nodular density in the right upper lobe.  Consider one of the following in 3 months for both low-risk and high-risk individuals: (a) repeat chest CT, (b) follow-up PET-CT, or (c) tissue sampling. This  recommendation follows the consensus statement: Guidelines for Management of Incidental Pulmonary Nodules Detected on CT Images: From the Fleischner Society 2017; Radiology 2017; 284:228-243. Aortic Atherosclerosis (ICD10-I70.0). Electronically Signed   By: Inez Catalina M.D.   On: 05/10/2020 12:32    EKG: Independently reviewed.  Normal sinus rhythm, no acute ST-T wave changes.  Assessment/Plan Principal Problem:   Hypoxia Active Problems:   Essential hypertension, benign   GERD (gastroesophageal reflux disease)   MDS/MPN (myelodysplastic/myeloproliferative neoplasms) ([place])     1.  Hypoxia/right-sided pleuritic chest pain: Exact cause unknown.  Suspect myeloproliferative disorder causing escalating pain. Patient requiring monitoring, pain management and optimization before discharge home. Will start patient on Tylenol, oxycodone, laxatives.  Oxygen to keep saturation more than 90%. She discussed with her oncologist who recommended palliative discussion and possible hospice, will consult palliative care team. Will check supplemental oxygen need for discharge.  2.  Frailty/failure to thrive/moderate protein calorie malnutrition/chronic anemia: As above.  Palliative discussion.  Augment nutrition.  3.  Hypochloremic hyponatremia: Probably due to use of hydrochlorothiazide.  Will discontinue.  4.  Essential hypertension: Blood pressure slightly elevated.  Resume metoprolol.  Discontinue hydrochlorothiazide.  5.  GERD: On PPI.  DVT prophylaxis: SCDs Code Status: DNR Family Communication: Patient talking to her son Disposition Plan: Home Consults called: Oncology, palliative medicine Admission status: Observation.  Telemetry.   Barb Merino MD [facility] Pager [phone]

## 2020-05-10 NOTE — Progress Notes (Signed)
Yvonnia Tango   DOB:Dec 10, 1930   WJ#:[id number]   AOZ#:[id number]  Hematology and oncology follow-up  Subjective: Patient is well-known to me, under my care for her MPN/MDS.  She was supposed to see me in clinic today for follow-up and start Retacrit injection, but presented to ED for sudden onset right-sided pleuritic chest pain.  She received IV morphine in the ED, but the pain persisted.  She was also found to be slightly hypoxic, on oxygen now.  Her son called in when I saw her in ED, and I spoke with him also.    Objective:  Vitals:   05/10/20 1519 05/10/20 1605  BP:  (!) 169/76  Pulse:  76  Resp:  16  Temp: 98.2 F (36.8 C) 98.4 F (36.9 C)  SpO2:  92%    Body mass index is 21.26 kg/m. No intake or output data in the 24 hours ending 05/10/20 1812   Sclerae unicteric  Oropharynx clear  No peripheral adenopathy  Lungs clear,slightly decreased breath sound in right lung base, no rales or rhonchi  Heart regular rate and rhythm  Abdomen benign  MSK no focal spinal tenderness, no peripheral edema  Neuro nonfocal    CBG (last 3)  No results for input(s): GLUCAP in the last 72 hours.   Labs:  Urine Studies No results for input(s): UHGB, CRYS in the last 72 hours.  Invalid input(s): UACOL, UAPR, USPG, UPH, UTP, UGL, UKET, UBIL, UNIT, UROB, ULEU, UEPI, UWBC, URBC, UBAC, CAST, UCOM, BILUA  Basic Metabolic Panel: Recent Labs  Lab 05/10/20 0906  NA 128*  K 3.5  CL 94*  CO2 26  GLUCOSE 101*  BUN 17  CREATININE 0.70  CALCIUM 8.4*   GFR Estimated Creatinine Clearance: 41 mL/min (by C-G formula based on SCr of 0.7 mg/dL). Liver Function Tests: Recent Labs  Lab 05/10/20 0906  AST 31  ALT 30  ALKPHOS 77  BILITOT 0.5  PROT 6.2*  ALBUMIN 3.7   No results for input(s): LIPASE, AMYLASE in the last 168 hours. No results for input(s): AMMONIA in the last 168 hours. Coagulation profile No results for input(s): INR, PROTIME in the last 168 hours.  CBC: Recent  Labs  Lab 05/10/20 0906  WBC 16.4*  HGB 8.6*  HCT 26.5*  MCV 109.5*  PLT 156   Cardiac Enzymes: No results for input(s): CKTOTAL, CKMB, CKMBINDEX, TROPONINI in the last 168 hours. BNP: Invalid input(s): POCBNP CBG: No results for input(s): GLUCAP in the last 168 hours. D-Dimer No results for input(s): DDIMER in the last 72 hours. Hgb A1c No results for input(s): HGBA1C in the last 72 hours. Lipid Profile No results for input(s): CHOL, HDL, LDLCALC, TRIG, CHOLHDL, LDLDIRECT in the last 72 hours. Thyroid function studies No results for input(s): TSH, T4TOTAL, T3FREE, THYROIDAB in the last 72 hours.  Invalid input(s): FREET3 Anemia work up No results for input(s): VITAMINB12, FOLATE, FERRITIN, TIBC, IRON, RETICCTPCT in the last 72 hours. Microbiology No results found for this or any previous visit (from the past 240 hour(s)).    Studies:  DG Ribs Unilateral W/Chest Right  Result Date: 05/10/2020 CLINICAL DATA:  right rib/chest pain EXAM: RIGHT RIBS AND CHEST - 3+ VIEW COMPARISON:  None. FINDINGS: No evidence of displaced rib fracture. There is no evidence of pneumothorax. There is a small right pleural effusion. Both lungs are clear. Heart size and mediastinal contours are within normal limits. IMPRESSION: Small right pleural effusion. No evidence of displaced rib fracture. Electronically Signed  By: Maurine Simmering   On: 05/10/2020 09:40   CT Angio Chest PE W/Cm &/Or Wo Cm  Result Date: 05/10/2020 CLINICAL DATA:  Right-sided chest pain for several days, initial encounter EXAM: CT ANGIOGRAPHY CHEST WITH CONTRAST TECHNIQUE: Multidetector CT imaging of the chest was performed using the standard protocol during bolus administration of intravenous contrast. Multiplanar CT image reconstructions and MIPs were obtained to evaluate the vascular anatomy. CONTRAST:  70m OMNIPAQUE IOHEXOL 350 MG/ML SOLN COMPARISON:  Chest x-ray from earlier in the same day. FINDINGS: Cardiovascular: Thoracic  aorta demonstrates atherosclerotic calcifications without aneurysmal dilatation. No significant enhancement is noted to rule out dissection. Heart is at the upper limits of normal in size. The pulmonary artery shows a normal branching pattern without definitive filling defect to suggest pulmonary embolism. Mediastinum/Nodes: Thoracic inlet is within normal limits. No sizable hilar or mediastinal adenopathy is noted. The esophagus is within normal limits. Lungs/Pleura: Bibasilar atelectatic changes are noted right greater than left with associated right-sided pleural effusion. Somewhat lobulated nodular density is noted measuring 9 mm in the anterior aspect of the right upper lobe best seen on image number 46 of series 6. No other sizable nodules are seen. Upper Abdomen: Scattered prominent cysts are noted in the central portion of the liver measuring up to 3.8 cm. Musculoskeletal: No chest wall abnormality. No acute or significant osseous findings. Review of the MIP images confirms the above findings. IMPRESSION: No evidence of pulmonary emboli. Bibasilar atelectasis with small right-sided pleural effusion. 9 mm lobulated nodular density in the right upper lobe. Consider one of the following in 3 months for both low-risk and high-risk individuals: (a) repeat chest CT, (b) follow-up PET-CT, or (c) tissue sampling. This recommendation follows the consensus statement: Guidelines for Management of Incidental Pulmonary Nodules Detected on CT Images: From the Fleischner Society 2017; Radiology 2017; 284:228-243. Aortic Atherosclerosis (ICD10-I70.0). Electronically Signed   By: MInez CatalinaM.D.   On: 05/10/2020 12:32    Assessment: 87y.o. female   1.  Right pleuritic chest pain and hypoxia, probably secondary to pleural effusion 2. MPN/MDS  3. Anemia secondary to #2 4.  Recently diagnosed left breast DCIS, high-grade 5.  History of bilateral breast cancer 6. Deconditioning and moderate malnutrition 7.  Hyponatremia   Plan:  -Her right-sided chest pain is pleuritic, likely related to the new onset small pleural effusion.  The etiology is unclear, could be virus, less likely empyema, small possibility of leukemic infiltrates. -she needs pain control, per primary team  -She has longstanding history of essential thrombocythemia, recently evolved to MPN/MDS syndrome, with significant anemia required blood transfusion.  I reviewed her recent bone marrow biopsy cytogenetics and FISH results, which showed multiple complex genetic abnormalities, predicts high risk MDS and poor prognosis (median survival less than 1 year). Her case was discussed in our hematology tumor board, the consensus is hypermethylation chemo treatment, such as Vidaza,  versus supportive care with blood transfusion and erythropoietin injections.  I discussed the above extensively with patient and her son, including the potential side effect from [place] especially cytopenias and risk of infections.  After lengthy discussion, patient clearly indicated her goal of therapy is quality of life, not quality of life.  She opted to continue EPO injection and supportive care such as blood transfusion.  Declined chemotherapy. -I also discussed palliative care and hospice.  Patient is open to it, and agrees to start palliative home care upon discharge.  -I will give one dose epogen tomorrow.  -I will  f/u her closely in my clinic.     Truitt Merle, MD 05/10/2020

## 2020-05-10 NOTE — ED Triage Notes (Signed)
Patient c/o RUQ pain that radiates into the right rib cage area x 3 days. Patient denies any N/v/d.

## 2020-05-10 NOTE — ED Notes (Signed)
Pt SpO2 dropped to 88% and she felt a little short of breath after administration of morphine. Placed on 2L [place]. SpO2 to 99%.

## 2020-05-10 NOTE — ED Provider Notes (Signed)
[facility] DEPT Provider Note   CSN: [id number] Arrival date & time: 05/10/20  0759     History Chief Complaint  Patient presents with  . Chest Pain    Jill Myers is a 87 y.o. female who presents to the ED today with complaint of gradual onset, constant, worsening, right lateral chest wall pain described as sharp in nature for the past 3-4 days. No trauma to the chest wall. Pain is exacerbated with deep inspiration. Has not been taking anything for the pain. Per chart review pt is currently being worked up by Dr. Burr Medico with concern for possible myeloproliferative neoplasms evolving to preleukemia. She was supposed to have a follow up appointment with him this AM however has to cancel due to the amount of pain she has been in on her right side. Pt denies any abdominal pain, nausea, vomiting, diarrhea, fevers, chills. She does feel short of breath with this pain. She thinks she is on a blood thinner however cannot recall the name of the medication. Per chart review pt only taking a baby aspirin at this time. No hx DVT/PE in the past. Pt is a former smoker. PSHx includes bilateral lumpectomy and cholecystectomy.   The history is provided by the patient and medical records.       Past Medical History:  Diagnosis Date  . Bladder cystocele 05/21/2010  . Complication of anesthesia    deglutition in history  . Deglutition disorder 08/17/2012   Single episode of choking/aspiration; preceded by trouble swallowing saliva (thin liquids).  For SLP evaluation. Aug 17, 2012.    [name] Disease of blood and blood forming organ 01/05/2007   Qualifier: Diagnosis of  By: Oneida Alar MD, KARL    . Diverticulitis   . DIVERTICULOSIS OF COLON 03/05/2006   Qualifier: Diagnosis of  By: Samara Snide    . DIZZINESS 01/18/2010   Qualifier: Diagnosis of  By: Lindell Noe MD, Jeneen Rinks    . Essential hypertension, benign 02/22/2008   Qualifier: Diagnosis of  By: Lindell Noe MD, Jeneen Rinks     . Essential  thrombocythemia ([place]) 01/31/11  . Family history of Alzheimer's disease 11/11/2013   Patient with family history of Alzheimers disease. She herself is very highly functional.  Plans for future visit with MMSE as primary function of the visit.    [name] GERD (gastroesophageal reflux disease) 08/16/2010  . HALLUX VALGUS, ACQUIRED 04/10/2009   Qualifier: Diagnosis of  By: [name] CMA,, [place]    . Hypertension   . HYPERTRIGLYCERIDEMIA 01/05/2007   Qualifier: Diagnosis of  By: Oneida Alar MD, KARL    . Insomnia 02/10/2014  . Left-sided chest wall pain 12/19/2011    L sided chest wall pain follows a dermatomal distribution and raises the possibility of thoracic radiculopathic pain, which would be expected to improve with gabapentin as well. We discussed this at length and she will call to make me aware if this does not get better. Rib belt/girdle in the meantime; may consider rib films or CXR if not improving.     . Metatarsalgia of right foot 11/28/2013   Referral to [facility]   . Osteopenia   . Personal history of radiation therapy    03/2017 right breast  . Restless leg syndrome 06/09/2011  . Restless legs syndrome (RLS)   . URINARY INCONTINENCE, URGE, MILD 11/27/2009   Qualifier: Diagnosis of  By: Zebedee Iba NP, Manuela Schwartz    . Vitamin D deficiency 08/16/2010    Patient Active Problem List   Diagnosis Date Noted  .  Symptomatic anemia 05/09/2020  . MDS/MPN (myelodysplastic/myeloproliferative neoplasms) ([place]) 05/09/2020  . Hyponatremia 02/02/2020  . Prepatellar effusion of right knee 02/02/2020  . Fall 05/24/2019  . Pain of back and right lower extremity 04/14/2019  . Osteoarthritis, multiple sites 12/24/2017  . Malignant neoplasm of upper-inner quadrant of right breast in female, estrogen receptor positive ([place]) 01/20/2017  . Malignant neoplasm of upper-outer quadrant of left breast in female, estrogen receptor positive ([place]) 01/20/2017  . Chronic constipation 06/19/2016  . Morton's neuroma of third interspaces of both  feet 04/16/2015  . Osteopenia 10/06/2014  . Hypertension   . Essential thrombocythemia ([place]) 05/08/2014  . Cystitis 02/20/2014  . Insomnia 02/10/2014  . Metatarsalgia of both feet 11/28/2013  . Family history of Alzheimer's disease 11/11/2013  . Deglutition disorder 08/17/2012  . Left-sided chest wall pain 12/19/2011  . Restless leg syndrome 06/09/2011  . GERD (gastroesophageal reflux disease) 08/16/2010  . Vitamin D deficiency 08/16/2010  . Advanced directives, counseling/discussion 08/16/2010  . Dysuria 05/21/2010  . Cystocele, unspecified (CODE) 05/21/2010  . DIZZINESS 01/18/2010  . URINARY INCONTINENCE, URGE, MILD 11/27/2009  . Hallux valgus, acquired 04/10/2009  . Essential hypertension, benign 02/22/2008  . HYPERTRIGLYCERIDEMIA 01/05/2007  . Disease of blood and blood forming organ 01/05/2007  . DIVERTICULOSIS OF COLON 03/05/2006    Past Surgical History:  Procedure Laterality Date  . BIOPSY BREAST Left 1998   Dr. Rebekah Chesterfield  . BREAST EXCISIONAL BIOPSY    . BREAST LUMPECTOMY Right 02/20/2017   Procedure: RIGHT BREAST LUMPECTOMY;  Surgeon: Jovita Kussmaul, MD;  Location: [facility];  Service: General;  Laterality: Right;  . BREAST LUMPECTOMY Left 02/20/2017   left lumpectomy  . BREAST LUMPECTOMY WITH RADIOACTIVE SEED LOCALIZATION Left 02/20/2017   Procedure: LEFT BREAST LUMPECTOMY WITH RADIOACTIVE SEED LOCALIZATION;  Surgeon: Jovita Kussmaul, MD;  Location: [place];  Service: General;  Laterality: Left;  . CATARACT EXTRACTION  2010   Dr. [name]  . CHOLECYSTECTOMY  1992  . COLONOSCOPY  2008  . MOHS SURGERY  2012   on face [facility] [place]  . NAILBED REPAIR  12/05/2010   Procedure: NAILBED REPAIR;  Surgeon: Cammie Sickle., MD;  Location: [facility];  Service: Orthopedics;  Laterality: Right;  full thickness nail biopsy right hallux  . torn tenden  2012   hand Dr. Orie Rout     OB History   No obstetric  history on file.     Family History  Problem Relation Age of Onset  . Heart disease Mother   . Tuberculosis Father   . Heart disease Sister   . Heart disease Brother   . Alzheimer's disease Brother   . Alzheimer's disease Brother     Social History   Tobacco Use  . Smoking status: Former Smoker    Years: 3.00    Quit date: 1958    Years since quitting: 64.3  . Smokeless tobacco: Never Used  Vaping Use  . Vaping Use: Never used  Substance Use Topics  . Alcohol use: Yes    Alcohol/week: 0.0 standard drinks    Comment: Wine rarely.  . Drug use: No    Home Medications Prior to Admission medications   Medication Sig Start Date End Date Taking? Authorizing Provider  aspirin 81 MG tablet Take 81 mg by mouth daily.   Yes [provider]  calcium carbonate (OSCAL) 1500 (600 Ca) MG TABS tablet Take 600 mg of elemental calcium by mouth in the morning  and at bedtime.    Yes [provider]  cholecalciferol (VITAMIN D) 1000 UNITS tablet Take 2,000 Units by mouth daily.  03/17/11  Yes [provider]  gabapentin (NEURONTIN) 300 MG capsule TAKE 1 CAPSULE BY MOUTH EVERY MORNING AND TAKE 3 CAPSULES BY MOUTH EVERY EVENING Patient taking differently: Take 300-900 mg by mouth See admin instructions. TAKE 300mg  BY MOUTH EVERY MORNING AND TAKE 900mg  BY MOUTH EVERY EVENING 04/02/20  Yes Mast, [name], NP  GLUCOSAMINE-CHONDROITIN PO Take 2 tablets by mouth daily. 1500-1200 03/17/11  Yes [provider]  hydrochlorothiazide (HYDRODIURIL) 25 MG tablet TAKE 1 TABLET BY MOUTH EVERY DAY Patient taking differently: Take 25 mg by mouth daily. 02/06/20  Yes Mast, [name], NP  ibuprofen (ADVIL,MOTRIN) 200 MG tablet Take 400 mg by mouth as needed for fever, headache or mild pain. 03/17/11  Yes [provider]  Magnesium 500 MG TABS Take 1 tablet by mouth daily.   Yes [provider]  metoprolol tartrate (LOPRESSOR) 25 MG tablet TAKE 1/2 TABLET BY MOUTH TWICE A  DAY Patient taking differently: Take 12.5 mg by mouth 2 (two) times daily. 01/17/20  Yes Mast, [name], NP  Omega-3 Fatty Acids (FISH OIL PO) Take 1 capsule by mouth daily.   Yes [provider]  omeprazole (PRILOSEC) 20 MG capsule TAKE 1 CAPSULE BY MOUTH EVERY DAY Patient taking differently: Take 20 mg by mouth daily. 01/04/20  Yes Mast, [name], NP  polyethylene glycol (MIRALAX / GLYCOLAX) packet Take 17 g by mouth daily.   Yes [provider]  potassium chloride SA (KLOR-CON) 20 MEQ tablet Take 20 mEq by mouth daily.   Yes [provider]  pyridOXINE (VITAMIN B-6) 100 MG tablet Take 100 mg by mouth daily.   Yes [provider]  solifenacin (VESICARE) 10 MG tablet TAKE 1 TABLET BY MOUTH EVERY DAY Patient taking differently: Take 5 mg by mouth daily. 05/04/20  Yes Mast, [name], NP    Allergies    Doxycycline, Macrodantin [nitrofurantoin], and Sulfamethoxazole-trimethoprim  Review of Systems   Review of Systems  Constitutional: Negative for chills and fever.  Respiratory: Positive for shortness of breath. Negative for cough.   Cardiovascular: Positive for chest pain.  Gastrointestinal: Negative for abdominal pain, diarrhea, nausea and vomiting.  All other systems reviewed and are negative.   Physical Exam Updated Vital Signs BP (!) 121/92 (BP Location: Right Arm)   Pulse 77   Temp 98.5 F (36.9 C) (Oral)   Resp 16   Ht 5\' 3"  (1.6 m)   Wt 54.4 kg   SpO2 99%   BMI 21.26 kg/m   Physical Exam Vitals and nursing note reviewed.  Constitutional:      Appearance: She is not ill-appearing or diaphoretic.  HENT:     Head: Normocephalic and atraumatic.  Eyes:     Conjunctiva/sclera: Conjunctivae normal.  Cardiovascular:     Rate and Rhythm: Normal rate and regular rhythm.     Pulses:          Radial pulses are 2+ on the right side and 2+ on the left side.       Dorsalis pedis pulses are 2+ on the right side and 2+ on the left side.     Heart sounds:  Normal heart sounds.  Pulmonary:     Effort: Pulmonary effort is normal.     Breath sounds: Normal breath sounds. No decreased breath sounds, wheezing, rhonchi or rales.  Chest:  Chest wall: Tenderness present.       Comments: No overlying skin changes to the right chest wall including no rash appreciated. + TTP to the right lateral chest wall; no crepitus.  Abdominal:     Palpations: Abdomen is soft.     Tenderness: There is no abdominal tenderness. There is no guarding or rebound.  Musculoskeletal:     Cervical back: Neck supple.  Skin:    General: Skin is warm and dry.  Neurological:     Mental Status: She is alert.     ED Results / Procedures / Treatments   Labs (all labs ordered are listed, but only abnormal results are displayed) Labs Reviewed  COMPREHENSIVE METABOLIC PANEL - Abnormal; Notable for the following components:      Result Value   Sodium 128 (*)    Chloride 94 (*)    Glucose, Bld 101 (*)    Calcium 8.4 (*)    Total Protein 6.2 (*)    All other components within normal limits  CBC - Abnormal; Notable for the following components:   WBC 16.4 (*)    RBC 2.42 (*)    Hemoglobin 8.6 (*)    HCT 26.5 (*)    MCV 109.5 (*)    MCH 35.5 (*)    RDW 23.0 (*)    nRBC 1.0 (*)    All other components within normal limits  URINALYSIS, ROUTINE W REFLEX MICROSCOPIC - Abnormal; Notable for the following components:   Color, Urine STRAW (*)    All other components within normal limits  SARS CORONAVIRUS 2 (TAT 6-24 HRS)  TROPONIN I (HIGH SENSITIVITY)  TROPONIN I (HIGH SENSITIVITY)    EKG EKG Interpretation  Date/Time:  Thursday May 10 2020 10:03:52 EDT Ventricular Rate:  68 PR Interval:  178 QRS Duration: 78 QT Interval:  414 QTC Calculation: 440 R Axis:   27 Text Interpretation: Normal sinus rhythm Septal infarct , age undetermined Abnormal ECG Confirmed by Daleen Bo [phone]) on 05/10/2020 2:28:01 PM   Radiology DG Ribs Unilateral W/Chest  Right  Result Date: 05/10/2020 CLINICAL DATA:  right rib/chest pain EXAM: RIGHT RIBS AND CHEST - 3+ VIEW COMPARISON:  None. FINDINGS: No evidence of displaced rib fracture. There is no evidence of pneumothorax. There is a small right pleural effusion. Both lungs are clear. Heart size and mediastinal contours are within normal limits. IMPRESSION: Small right pleural effusion. No evidence of displaced rib fracture. Electronically Signed   By: Maurine Simmering   On: 05/10/2020 09:40   CT Angio Chest PE W/Cm &/Or Wo Cm  Result Date: 05/10/2020 CLINICAL DATA:  Right-sided chest pain for several days, initial encounter EXAM: CT ANGIOGRAPHY CHEST WITH CONTRAST TECHNIQUE: Multidetector CT imaging of the chest was performed using the standard protocol during bolus administration of intravenous contrast. Multiplanar CT image reconstructions and MIPs were obtained to evaluate the vascular anatomy. CONTRAST:  54mL OMNIPAQUE IOHEXOL 350 MG/ML SOLN COMPARISON:  Chest x-ray from earlier in the same day. FINDINGS: Cardiovascular: Thoracic aorta demonstrates atherosclerotic calcifications without aneurysmal dilatation. No significant enhancement is noted to rule out dissection. Heart is at the upper limits of normal in size. The pulmonary artery shows a normal branching pattern without definitive filling defect to suggest pulmonary embolism. Mediastinum/Nodes: Thoracic inlet is within normal limits. No sizable hilar or mediastinal adenopathy is noted. The esophagus is within normal limits. Lungs/Pleura: Bibasilar atelectatic changes are noted right greater than left with associated right-sided pleural effusion. Somewhat lobulated nodular density is noted  measuring 9 mm in the anterior aspect of the right upper lobe best seen on image number 46 of series 6. No other sizable nodules are seen. Upper Abdomen: Scattered prominent cysts are noted in the central portion of the liver measuring up to 3.8 cm. Musculoskeletal: No chest wall  abnormality. No acute or significant osseous findings. Review of the MIP images confirms the above findings. IMPRESSION: No evidence of pulmonary emboli. Bibasilar atelectasis with small right-sided pleural effusion. 9 mm lobulated nodular density in the right upper lobe. Consider one of the following in 3 months for both low-risk and high-risk individuals: (a) repeat chest CT, (b) follow-up PET-CT, or (c) tissue sampling. This recommendation follows the consensus statement: Guidelines for Management of Incidental Pulmonary Nodules Detected on CT Images: From the Fleischner Society 2017; Radiology 2017; 284:228-243. Aortic Atherosclerosis (ICD10-I70.0). Electronically Signed   By: Inez Catalina M.D.   On: 05/10/2020 12:32    Procedures Procedures   Medications Ordered in ED Medications  iohexol (OMNIPAQUE) 350 MG/ML injection 75 mL (75 mLs Intravenous Contrast Given 05/10/20 1157)  morphine 2 MG/ML injection 2 mg (2 mg Intravenous Given 05/10/20 1321)    ED Course  I have reviewed the triage vital signs and the nursing notes.  Pertinent labs & imaging results that were available during my care of the patient were reviewed by me and considered in my medical decision making (see chart for details).    MDM Rules/Calculators/A&P                          87 year old female who is currently being worked up by Dr. Burr Medico oncology with concern for possible myeloproliferative neoplasm starting into preleukemia; resenting with right lateral chest wall pain for the past 3 to 4 days.  No trauma.  On arrival to the ED vitals are stable.  Patient is afebrile, nontachycardic and nontachypneic and appears to be in no acute distress.  She does have a right lateral chest wall tenderness palpation without crepitus.  She has no overlying skin changes including no vesicular rash appreciated to suggest goals at this time.  Given currently being worked up for possible cancer there is concern for PE at this time.  Will  work-up for chest pain at this time.  Patient was initially triaged as right upper quadrant pain, past surgical history of cholecystectomy.  No abdominal tenderness palpation on exam.  CBC with stable WBC at 16,400. Hgb stable at 8.6.   WBC  Date Value Ref Range Status  05/10/2020 16.4 (H) 4.0 - 10.5 K/uL Final  05/02/2020 17.9 (H) 4.0 - 10.5 K/uL Final  02/15/2019 4.8 3.8 - 10.8 Thousand/uL Final  12/10/2017 8.0 3.8 - 10.8 Thousand/uL Final   WBC Count  Date Value Ref Range Status  04/17/2020 13.8 (H) 4.0 - 10.5 K/uL Final  04/12/2020 11.4 (H) 4.0 - 10.5 K/uL Final  04/03/2020 7.8 4.0 - 10.5 K/uL Final  02/22/2020 5.6 4.0 - 10.5 K/uL Final   CMP with sodium 128, chloride 94. Glucose 101. No other electrolyte abnormalities.  Troponin 3 CXR with small right sided pleural effusion  CTA ordered due to cancer hx and concern for PE: IMPRESSION:  No evidence of pulmonary emboli.    Bibasilar atelectasis with small right-sided pleural effusion.    9 mm lobulated nodular density in the right upper lobe. Consider one  of the following in 3 months for both low-risk and high-risk  individuals: (a) repeat chest CT, (b)  follow-up PET-CT, or (c)  tissue sampling. This recommendation follows the consensus  statement: Guidelines for Management of Incidental Pulmonary Nodules  Detected on CT Images: From the Fleischner Society 2017; Radiology  2017; 284:228-243.    Aortic Atherosclerosis (ICD10-I70.0).   Discussed case with oncologist Dr. Burr Medico who will come evaluate patient in the ED. Appreciate her involvement.   Dr. Burr Medico has evaluated patient; while in the room pt became slightly hypoxic at 88% on placed on 2L. Her pain is controlled at this time however she lives alone and does not have much support; we have discussed admitting for observation and Dr. Burr Medico to come see patient again tomorrow. Will call for admission at this time.   Dr. Sloan Leiter to admit patient for further eval.   This  note was prepared using Dragon voice recognition software and may include unintentional dictation errors due to the inherent limitations of voice recognition software.  Final Clinical Impression(s) / ED Diagnoses Final diagnoses:  Pleural effusion    Rx / DC Orders ED Discharge Orders    None       Eustaquio Maize, PA-C 05/10/20 1503    Daleen Bo, MD 05/10/20 1555

## 2020-05-10 NOTE — ED Provider Notes (Signed)
Emergency Medicine Provider Triage Evaluation Note  Jill Myers , a 87 y.o. female  was evaluated in triage.  Pt complains of + right lateral chest pain for the past few days, worsening recently. Pleuritic in nature. No RUQ pain. No nausea, vomiting, diarrhea. Currently being worked up for leukemia; no treatment yet. Oncologist Burr Medico. No trauma to the chest wall.  Review of Systems  Positive: + chest pain, SOB Negative: - abdominal, nausea, vomiting  Physical Exam  BP (!) 121/92 (BP Location: Right Arm)   Pulse 77   Temp 98.5 F (36.9 C) (Oral)   Resp 16   Ht 5\' 3"  (1.6 m)   Wt 54.4 kg   SpO2 99%   BMI 21.26 kg/m  Gen:   Awake, no distress   Resp:  Normal effort  MSK:   Moves extremities without difficulty  Other:  + right lateral chest wall pain  Medical Decision Making  Medically screening exam initiated at 8:51 AM.  Appropriate orders placed.  Jill Myers was informed that the remainder of the evaluation will be completed by another provider, this initial triage assessment does not replace that evaluation, and the importance of remaining in the ED until their evaluation is complete.  Hx of leukemia; concern for PE.    Eustaquio Maize, PA-C 05/10/20 5170    Daleen Bo, MD 05/10/20 [phone]

## 2020-05-10 NOTE — ED Provider Notes (Signed)
  Face-to-face evaluation   History: She presents for evaluation of right mid to upper chest wall discomfort present for several days, worse this morning.  Pain is worse with deep breathing, movement and palpation.  She denies fever, chills, shortness of breath, cough, abdominal pain or back pain.  She was supposed to see her oncologist today to follow-up on ongoing evaluation including recurrence of breast cancer, and progressive blood dyscrasia felt to be myelodysplastic disorder.  Physical exam: Awake, alert and cooperative.  Chest wall tender laterally, diffuse, no deformity.  Abdomen soft and nontender to palpation.  No respiratory distress.  Medical screening examination/treatment/procedure(s) were conducted as a shared visit with non-physician practitioner(s) and myself.  I personally evaluated the patient during the encounter    Daleen Bo, MD 05/10/20 1555

## 2020-05-10 NOTE — Plan of Care (Signed)

## 2020-05-11 DIAGNOSIS — I10 Essential (primary) hypertension: Secondary | ICD-10-CM | POA: Diagnosis not present

## 2020-05-11 DIAGNOSIS — K219 Gastro-esophageal reflux disease without esophagitis: Secondary | ICD-10-CM | POA: Diagnosis not present

## 2020-05-11 DIAGNOSIS — D469 Myelodysplastic syndrome, unspecified: Secondary | ICD-10-CM

## 2020-05-11 DIAGNOSIS — E876 Hypokalemia: Secondary | ICD-10-CM

## 2020-05-11 DIAGNOSIS — Z515 Encounter for palliative care: Secondary | ICD-10-CM

## 2020-05-11 DIAGNOSIS — Z7189 Other specified counseling: Secondary | ICD-10-CM

## 2020-05-11 DIAGNOSIS — J90 Pleural effusion, not elsewhere classified: Secondary | ICD-10-CM

## 2020-05-11 DIAGNOSIS — R0902 Hypoxemia: Secondary | ICD-10-CM | POA: Diagnosis not present

## 2020-05-11 LAB — CBC
HCT: 25.4 % — ABNORMAL LOW (ref 36.0–46.0)
Hemoglobin: 8.2 g/dL — ABNORMAL LOW (ref 12.0–15.0)
MCH: 35.5 pg — ABNORMAL HIGH (ref 26.0–34.0)
MCHC: 32.3 g/dL (ref 30.0–36.0)
MCV: 110 fL — ABNORMAL HIGH (ref 80.0–100.0)
Platelets: 153 10*3/uL (ref 150–400)
RBC: 2.31 MIL/uL — ABNORMAL LOW (ref 3.87–5.11)
RDW: 22.5 % — ABNORMAL HIGH (ref 11.5–15.5)
WBC: 13.1 10*3/uL — ABNORMAL HIGH (ref 4.0–10.5)
nRBC: 0.8 % — ABNORMAL HIGH (ref 0.0–0.2)

## 2020-05-11 LAB — BASIC METABOLIC PANEL
Anion gap: 11 (ref 5–15)
BUN: 12 mg/dL (ref 8–23)
CO2: 26 mmol/L (ref 22–32)
Calcium: 8.3 mg/dL — ABNORMAL LOW (ref 8.9–10.3)
Chloride: 92 mmol/L — ABNORMAL LOW (ref 98–111)
Creatinine, Ser: 0.63 mg/dL (ref 0.44–1.00)
GFR, Estimated: >60 mL/min (ref >60)
Glucose, Bld: 93 mg/dL (ref 70–99)
Potassium: 3 mmol/L — ABNORMAL LOW (ref 3.5–5.1)
Sodium: 129 mmol/L — ABNORMAL LOW (ref 135–145)

## 2020-05-11 LAB — SARS CORONAVIRUS 2 (TAT 6-24 HRS): SARS Coronavirus 2: NEGATIVE

## 2020-05-11 LAB — PHOSPHORUS: Phosphorus: 3.8 mg/dL (ref 2.5–4.6)

## 2020-05-11 LAB — MAGNESIUM: Magnesium: 1.8 mg/dL (ref 1.7–2.4)

## 2020-05-11 MED ORDER — POTASSIUM CHLORIDE CRYS ER 20 MEQ PO TBCR
40.0000 meq | EXTENDED_RELEASE_TABLET | ORAL | Status: AC
  Administered 2020-05-11 (×2): 40 meq via ORAL
  Filled 2020-05-11 (×2): qty 2

## 2020-05-11 MED ORDER — SODIUM CHLORIDE 1 G PO TABS
1.0000 g | ORAL_TABLET | Freq: Three times a day (TID) | ORAL | Status: DC
  Administered 2020-05-11 – 2020-05-12 (×2): 1 g via ORAL
  Filled 2020-05-11 (×3): qty 1

## 2020-05-11 MED ORDER — LIP MEDEX EX OINT
TOPICAL_OINTMENT | CUTANEOUS | Status: AC
  Filled 2020-05-11: qty 7

## 2020-05-11 MED ORDER — ADULT MULTIVITAMIN W/MINERALS CH
1.0000 | ORAL_TABLET | Freq: Every day | ORAL | Status: DC
  Administered 2020-05-11 – 2020-05-12 (×2): 1 via ORAL
  Filled 2020-05-11 (×2): qty 1

## 2020-05-11 MED ORDER — ENSURE ENLIVE PO LIQD
237.0000 mL | Freq: Two times a day (BID) | ORAL | Status: DC
  Administered 2020-05-12: 237 mL via ORAL

## 2020-05-11 NOTE — Progress Notes (Signed)
PROGRESS NOTE    Jill Myers  TIR:[id number] DOB: 1930-07-04 DOA: 05/10/2020 PCP: Mast, [name], NP    Brief Narrative:  Jill Myers was admitted to the hospital with the working diagnosis of right sided pleuritic chest pain related to small pleural effusion.   87 year old female past medical history for myelodysplastic syndrome, chronic anemia, hypertension and GERD who presented with gradual onset and worsening lateral chest wall pain.  4 days in duration, 9/10 in intensity, worse with movement and deep breathing.  Sharp in nature.  On her initial physical examination blood pressure 160/70, heart rate 74, respiratory rate 18, temperature 98.2, oxygen saturation 96%.  Her lungs are clear to auscultation bilaterally, heart S1-S2, present, rhythmic, soft abdomen, no lower extremity edema.  Sodium 128, potassium 3.5, chloride 94, bicarb 26, glucose 101, BUN 17, creatinine 0.70, white cell count 16.4, hemoglobin 8.6, hematocrit 26.5, platelets 156. SARS COVID-19 negative. Urinalysis specific gravity 1.019.  CT chest with small right pleural effusion, right base atelectasis, 9 mm lobulated nodular density in the right upper lobe. EKG 68 bpm, normal axis, normal intervals, sinus rhythm, Q-wave in V1-V2, no significant ST segment or T wave changes.  Assessment & Plan:   Principal Problem:   Hypoxia Active Problems:   Essential hypertension, benign   GERD (gastroesophageal reflux disease)   Hypertension   MDS/MPN (myelodysplastic/myeloproliferative neoplasms) (HCC)   Hypokalemia   1. Acute hypoxemic respiratory failure due to right sided pleurisy, associated small pleural effusion.  Patient continue to have pleuritic chest pain on the right. Her oxygenation is 94% on room air.   Continue pain control (ibuprofen, oxycodone), incentive spirometer and oxymetry monitoring. Follow with PT, OT and nutrition consultation.   2, Hyponatremia and hypokalemia. Clinically euvolemic  hyponatremia, patient with increased free water consumption.  Possible component of SIADH.  Check urine and serum osmolality, urinary Na.   Plan for fluid restriction and salt tablets, follow on renal function in am. Add 40 meq Kcl po x2 doses.  Continue to hold on diuretic therapy.  3. Myelodysplastic syndrome/ malignancy related anemia. Continue supportive medical therapy, including EPO, follow with Oncology as outpatient.   Reactive leokocytosis.   4. HTN. Continue blood pressure control with metoprolol, continue to hold on HZTC.  Continue aspirin.   5. GERD. Continue with pantoprazole.   Patient continue to be at high risk for worsening electrolyte disturbances.   Status is: Observation  The patient remains OBS appropriate and will d/c before 2 midnights.  Dispo: The patient is from: Home              Anticipated d/c is to: Home              Patient currently is not medically stable to d/c.   Difficult to place patient No    DVT prophylaxis: Enoxaparin   Code Status:   DNR   Family Communication:     Consultants:   Oncology   Subjective: Patient continue to have right sided chest pain, not yet back to baseline, no nausea or vomiting, no chest pain. Continue to be very weak and deconditioned.   Objective: Vitals:   05/10/20 2228 05/11/20 0007 05/11/20 0404 05/11/20 0600  BP:  129/71 134/69   Pulse: 85 71 64   Resp:  16 16   Temp:  98.2 F (36.8 C) 97.6 F (36.4 C)   TempSrc:  Oral Oral   SpO2: 94% 96% 94%   Weight:    53.6 kg  Height:  Intake/Output Summary (Last 24 hours) at 05/11/2020 1301 Last data filed at 05/11/2020 0900 Gross per 24 hour  Intake 960 ml  Output 900 ml  Net 60 ml   Filed Weights   05/10/20 0817 05/11/20 0600  Weight: 54.4 kg 53.6 kg    Examination:   General: Not in pain or dyspnea, deconditioned  Neurology: Awake and alert, non focal  E ENT: mild pallor, no icterus, oral mucosa moist Cardiovascular: No JVD. S1-S2  present, rhythmic, no gallops, rubs, or murmurs. No lower extremity edema. Pulmonary: positive breath sounds bilaterally, adequate air movement, no wheezing, rhonchi or rales. Gastrointestinal. Abdomen soft and non tender Skin. No rashes Musculoskeletal: no joint deformities     Data Reviewed: I have personally reviewed following labs and imaging studies  CBC: Recent Labs  Lab 05/10/20 0906 05/11/20 0452  WBC 16.4* 13.1*  HGB 8.6* 8.2*  HCT 26.5* 25.4*  MCV 109.5* 110.0*  PLT 156 829   Basic Metabolic Panel: Recent Labs  Lab 05/10/20 0906 05/11/20 0452  NA 128* 129*  K 3.5 3.0*  CL 94* 92*  CO2 26 26  GLUCOSE 101* 93  BUN 17 12  CREATININE 0.70 0.63  CALCIUM 8.4* 8.3*  MG  --  1.8  PHOS  --  3.8   GFR: Estimated Creatinine Clearance: 41 mL/min (by C-G formula based on SCr of 0.63 mg/dL). Liver Function Tests: Recent Labs  Lab 05/10/20 0906  AST 31  ALT 30  ALKPHOS 77  BILITOT 0.5  PROT 6.2*  ALBUMIN 3.7   No results for input(s): LIPASE, AMYLASE in the last 168 hours. No results for input(s): AMMONIA in the last 168 hours. Coagulation Profile: No results for input(s): INR, PROTIME in the last 168 hours. Cardiac Enzymes: No results for input(s): CKTOTAL, CKMB, CKMBINDEX, TROPONINI in the last 168 hours. BNP (last 3 results) No results for input(s): PROBNP in the last 8760 hours. HbA1C: No results for input(s): HGBA1C in the last 72 hours. CBG: No results for input(s): GLUCAP in the last 168 hours. Lipid Profile: No results for input(s): CHOL, HDL, LDLCALC, TRIG, CHOLHDL, LDLDIRECT in the last 72 hours. Thyroid Function Tests: No results for input(s): TSH, T4TOTAL, FREET4, T3FREE, THYROIDAB in the last 72 hours. Anemia Panel: No results for input(s): VITAMINB12, FOLATE, FERRITIN, TIBC, IRON, RETICCTPCT in the last 72 hours.    Radiology Studies: I have reviewed all of the imaging during this hospital visit personally     Scheduled Meds: .  aspirin EC  81 mg Oral Daily  . calcium carbonate  1 tablet Oral Q breakfast  . cholecalciferol  2,000 Units Oral Daily  . epoetin (EPOGEN/PROCRIT) injection  10,000 Units Subcutaneous Once  . gabapentin  300 mg Oral Daily  . gabapentin  900 mg Oral QHS  . magnesium gluconate  500 mg Oral Daily  . metoprolol tartrate  12.5 mg Oral BID  . pantoprazole  40 mg Oral Daily  . polyethylene glycol  17 g Oral Daily  . potassium chloride  40 mEq Oral Q4H  . pyridOXINE  100 mg Oral Daily   Continuous Infusions:   LOS: 0 days        Jill Knobloch Gerome Apley, MD

## 2020-05-11 NOTE — Progress Notes (Signed)
Initial Nutrition Assessment  INTERVENTION:   -Ensure Enlive po BID, each supplement provides 350 kcal and 20 grams of protein  -Multivitamin with minerals daily  NUTRITION DIAGNOSIS:   Increased nutrient needs related to chronic illness as evidenced by estimated needs.  GOAL:   Patient will meet greater than or equal to 90% of their needs  MONITOR:   PO intake,Supplement acceptance,Labs,Weight trends,I & O's  REASON FOR ASSESSMENT:   Consult Assessment of nutrition requirement/status  ASSESSMENT:   87 year old female past medical history for myelodysplastic syndrome, chronic anemia, hypertension and GERD who presented with gradual onset and worsening lateral chest wall pain. Admitted to the hospital with the working diagnosis of right sided pleuritic chest pain related to small pleural effusion.  Patient currently consuming 100% of meals at this time. Tolerating.  Per oncology note, pt continuing current care but declines chemotherapy.  Pt has had very poor appetite. Will order Ensure supplements for additional kcals and protein.  Per weight records, pt has lost 8 lbs since 1/27 (6% wt loss x 3.5 months, insignificant for time frame). Weights are trending down.  Medications: OSCAL, Vitamin D, Magonate, Miralax, KLOR-CON, Vitamin B-6  Labs reviewed: Low Na, K  NUTRITION - FOCUSED PHYSICAL EXAM:  Unable to complete, will attempt if still admitted (noted she is observation at this time).  Diet Order:   Diet Order            Diet Heart Room service appropriate? Yes; Fluid consistency: Thin; Fluid restriction: Other (see comments)  Diet effective now                 EDUCATION NEEDS:   No education needs have been identified at this time  Skin:  Skin Assessment: Reviewed RN Assessment  Last BM:  5/5  Height:   Ht Readings from Last 1 Encounters:  05/10/20 5\' 3"  (1.6 m)    Weight:   Wt Readings from Last 1 Encounters:  05/11/20 53.6 kg    BMI:   Body mass index is 20.93 kg/m.  Estimated Nutritional Needs:   Kcal:  1500-1700  Protein:  80-90g  Fluid:  1.7L/day  Clayton Bibles, MS, RD, LDN Inpatient Clinical Dietitian Contact information available via Amion

## 2020-05-11 NOTE — Evaluation (Signed)
Physical Therapy Evaluation Patient Details Name: Jill Myers MRN: [id number] DOB: 12/08/1930 Today's Date: 05/11/2020   History of Present Illness  Selita Irja Wheless is a 87 y.o. female with medical history significant of myelodysplastic syndrome, chronic anemia, hypertension, GERD presented to the emergency room with gradual onset and worsening right lateral chest wall pain. Admitted for hypoxia and pleuritic pain.  Clinical Impression  Pt admitted as above and presenting with functional mobility limitations 2* pain with movement - most notably with bed mobility and transfers but less prominent once on feet.  Pt very motivated and should progress well to regain IND and return to IND living at [facility].    Follow Up Recommendations No PT follow up    Equipment Recommendations  None recommended by PT    Recommendations for Other Services       Precautions / Restrictions Precautions Precaution Comments: Reports dizziness AFTER ambulation - BP WNL Restrictions Weight Bearing Restrictions: No      Mobility  Bed Mobility               General bed mobility comments: On toilet when therapist entered the room. Suspect supervision only.    Transfers Overall transfer level: Needs assistance Equipment used: None             General transfer comment: Sup for safety  Ambulation/Gait Ambulation/Gait assistance: Min guard;Supervision Gait Distance (Feet): 240 Feet Assistive device: None Gait Pattern/deviations: Step-through pattern;Decreased step length - right;Decreased step length - left;Shuffle Gait velocity: decr   General Gait Details: mild general instability but no LOB - pt moving with good safety awareness states "I am careful so I don't fall"  Stairs            Wheelchair Mobility    Modified Rankin (Stroke Patients Only)       Balance Overall balance assessment: Mild deficits observed, not formally tested (Pt demonstrates ability to  step bk, sideways and braided walking with min difficulty)                                           Pertinent Vitals/Pain Pain Assessment: Faces Faces Pain Scale: Hurts little more Pain Location: R chest pain Pain Descriptors / Indicators: Grimacing Pain Intervention(s): Limited activity within patient's tolerance;Monitored during session;Premedicated before session;Ice applied    Home Living Family/patient expects to be discharged to:: Other (Comment)                 Additional Comments: Indepenent Living at [facility]. Active at baseline without DME.    Prior Function Level of Independence: Independent               Hand Dominance   Dominant Hand: Right    Extremity/Trunk Assessment   Upper Extremity Assessment Upper Extremity Assessment: Overall WFL for tasks assessed    Lower Extremity Assessment Lower Extremity Assessment: Overall WFL for tasks assessed    Cervical / Trunk Assessment Cervical / Trunk Assessment: Normal  Communication   Communication: No difficulties  Cognition Arousal/Alertness: Awake/alert Behavior During Therapy: WFL for tasks assessed/performed Overall Cognitive Status: Within Functional Limits for tasks assessed                                        General Comments  Exercises     Assessment/Plan    PT Assessment Patient needs continued PT services  PT Problem List Decreased activity tolerance;Pain       PT Treatment Interventions DME instruction;Gait training;Functional mobility training;Therapeutic activities;Therapeutic exercise;Patient/family education    PT Goals (Current goals can be found in the Care Plan section)  Acute Rehab PT Goals Patient Stated Goal: Regain IND with less pain PT Goal Formulation: With patient Time For Goal Achievement: 05/25/20 Potential to Achieve Goals: Good    Frequency Min 3X/week   Barriers to discharge        Co-evaluation                AM-PAC PT "6 Clicks" Mobility  Outcome Measure Help needed turning from your back to your side while in a flat bed without using bedrails?: A Little Help needed moving from lying on your back to sitting on the side of a flat bed without using bedrails?: A Little Help needed moving to and from a bed to a chair (including a wheelchair)?: A Little Help needed standing up from a chair using your arms (e.g., wheelchair or bedside chair)?: A Little Help needed to walk in hospital room?: A Little Help needed climbing 3-5 steps with a railing? : A Little 6 Click Score: 18    End of Session Equipment Utilized During Treatment: Gait belt Activity Tolerance: Patient tolerated treatment well;Patient limited by pain Patient left: in chair;with call bell/phone within reach;with chair alarm set Nurse Communication: Mobility status PT Visit Diagnosis: Difficulty in walking, not elsewhere classified (R26.2)    Time: 8757-9728 PT Time Calculation (min) (ACUTE ONLY): 23 min   Charges:   PT Evaluation $PT Eval Low Complexity: [place] Pager [phone] Office [phone]   Nayeli Calvert 05/11/2020, 5:37 PM

## 2020-05-11 NOTE — Evaluation (Signed)
Occupational Therapy Evaluation Patient Details Name: Jill Myers MRN: [id number] DOB: September 02, 1930 Today's Date: 05/11/2020    History of Present Illness Jill Myers is a 87 y.o. female with medical history significant of myelodysplastic syndrome, chronic anemia, hypertension, GERD presented to the emergency room with gradual onset and worsening right lateral chest wall pain. Admitted for hypoxia and pleuritic pain.   Clinical Impression   Patient presents with ability to perform functional mobility and ADLs at her baseline. Patient reports pleuritic chest pain that is limiting but able to perform all tasks. Patient reporting needing rest breaks now and reported dizziness after ambulation though vitals signs stable. Patient appears to be near her baseline in regards to functional tasks. Discussed need of possible caregiver to assist with household tasks at discharge. No further OT needs.     Follow Up Recommendations  No OT follow up    Equipment Recommendations  None recommended by OT    Recommendations for Other Services       Precautions / Restrictions Precautions Precautions: Other (comment) Precaution Comments: Reports dizziness AFTER ambulation Restrictions Weight Bearing Restrictions: No      Mobility Bed Mobility               General bed mobility comments: On toilet when therapist entered the room. Suspect supervision only.    Transfers Overall transfer level: Needs assistance Equipment used: None             General transfer comment: ambulated in hall without a device and exhibits no balance deficits. Reported dizziness after ambulation - VS WNL. O2 maintained above 93% on room air throughout.    Balance Overall balance assessment: No apparent balance deficits (not formally assessed)                                         ADL either performed or assessed with clinical judgement   ADL Overall ADL's : Modified  independent                                       General ADL Comments: Increased time to perform activities due to pain and needing rest breaks after activity - otherwise at baseline.     Vision Patient Visual Report: No change from baseline       Perception     Praxis      Pertinent Vitals/Pain Pain Assessment: Faces Faces Pain Scale: Hurts little more Pain Location: R chest pain Pain Descriptors / Indicators: Grimacing Pain Intervention(s): Monitored during session;Limited activity within patient's tolerance     Hand Dominance Right   Extremity/Trunk Assessment Upper Extremity Assessment Upper Extremity Assessment: Overall WFL for tasks assessed   Lower Extremity Assessment Lower Extremity Assessment: Defer to PT evaluation   Cervical / Trunk Assessment Cervical / Trunk Assessment: Normal   Communication Communication Communication: No difficulties   Cognition Arousal/Alertness: Awake/alert Behavior During Therapy: WFL for tasks assessed/performed Overall Cognitive Status: Within Functional Limits for tasks assessed                                     General Comments       Exercises     Shoulder Instructions      Home  Living Family/patient expects to be discharged to:: Other (Comment) (Independent Living)                                 Additional Comments: Indepenent Livign at [facility]. Active at baseline without DME.      Prior Functioning/Environment Level of Independence: Independent                 OT Problem List: Decreased activity tolerance;Pain      OT Treatment/Interventions:      OT Goals(Current goals can be found in the care plan section) Acute Rehab OT Goals OT Goal Formulation: All assessment and education complete, DC therapy  OT Frequency:     Barriers to D/C:            Co-evaluation              AM-PAC OT "6 Clicks" Daily Activity     Outcome Measure Help  from another person eating meals?: None Help from another person taking care of personal grooming?: None Help from another person toileting, which includes using toliet, bedpan, or urinal?: None Help from another person bathing (including washing, rinsing, drying)?: None Help from another person to put on and taking off regular upper body clothing?: None Help from another person to put on and taking off regular lower body clothing?: None 6 Click Score: 24   End of Session Equipment Utilized During Treatment: Gait belt Nurse Communication: Mobility status  Activity Tolerance: Patient tolerated treatment well Patient left: in chair;with call bell/phone within reach;with chair alarm set  OT Visit Diagnosis: Pain                Time: 8250-0370 OT Time Calculation (min): 21 min Charges:  OT General Charges $OT Visit: 1 Visit OT Evaluation $OT Eval Low Complexity: 1 Low  Gitty Osterlund, OTR/L [place]  Office [phone] Pager: [place] 05/11/2020, 11:16 AM

## 2020-05-12 DIAGNOSIS — I10 Essential (primary) hypertension: Secondary | ICD-10-CM | POA: Diagnosis not present

## 2020-05-12 DIAGNOSIS — R0902 Hypoxemia: Secondary | ICD-10-CM | POA: Diagnosis not present

## 2020-05-12 DIAGNOSIS — E876 Hypokalemia: Secondary | ICD-10-CM | POA: Diagnosis not present

## 2020-05-12 DIAGNOSIS — D469 Myelodysplastic syndrome, unspecified: Secondary | ICD-10-CM | POA: Diagnosis not present

## 2020-05-12 DIAGNOSIS — K219 Gastro-esophageal reflux disease without esophagitis: Secondary | ICD-10-CM | POA: Diagnosis not present

## 2020-05-12 LAB — BASIC METABOLIC PANEL
Anion gap: 9 (ref 5–15)
BUN: 12 mg/dL (ref 8–23)
CO2: 25 mmol/L (ref 22–32)
Calcium: 8.7 mg/dL — ABNORMAL LOW (ref 8.9–10.3)
Chloride: 98 mmol/L (ref 98–111)
Creatinine, Ser: 0.91 mg/dL (ref 0.44–1.00)
GFR, Estimated: >60 mL/min (ref >60)
Glucose, Bld: 130 mg/dL — ABNORMAL HIGH (ref 70–99)
Potassium: 3.9 mmol/L (ref 3.5–5.1)
Sodium: 132 mmol/L — ABNORMAL LOW (ref 135–145)

## 2020-05-12 LAB — GLUCOSE, CAPILLARY: Glucose-Capillary: 104 mg/dL — ABNORMAL HIGH (ref 70–99)

## 2020-05-12 MED ORDER — ADULT MULTIVITAMIN W/MINERALS CH: 1.0000 | ORAL_TABLET | Freq: Every day | ORAL | 0 refills | Status: AC

## 2020-05-12 MED ORDER — ENSURE ENLIVE PO LIQD: 237.0000 mL | Freq: Two times a day (BID) | ORAL | 0 refills | Status: AC

## 2020-05-12 NOTE — Discharge Summary (Signed)
Physician Discharge Summary  Jill Myers XTG:[id number] DOB: 12/03/1930 DOA: 05/10/2020  PCP: Mast, [name], NP  Admit date: 05/10/2020 Discharge date: 05/12/2020  Admitted From: Home  Disposition:  Home   Recommendations for Outpatient Follow-up and new medication changes:  1. Follow up with Man Mast X NP in 7 to 10 days,  2. Follow up basic metabolic panel in 7 days. 3. Discontinue HCTZ and K supplements to prevent electrolyte disturbances.  4. Follow up with right upper lobe nodule 9 mm.   Home Health: na   Equipment/Devices: na    Discharge Condition: stable  CODE STATUS: DNR   Diet recommendation: heart healthy   Brief/Interim Summary: Jill Myers was admitted to the hospital with the working diagnosis of right sided pleuritic chest pain related to small pleural effusion, complicated with acute hypoxemic respiratory failure.   87 year old female past medical history for myelodysplastic syndrome, chronic anemia, hypertension and GERD who presented with gradual onset and worsening lateral chest wall pain.  4 days in duration, 9/10 in intensity, worse with movement and deep breathing.  Sharp in nature.  On her initial physical examination blood pressure 160/70, heart rate 74, respiratory rate 18, temperature 98.2, oxygen saturation 96%.  Her lungs were clear to auscultation bilaterally, heart S1-S2, present, rhythmic, soft abdomen, no lower extremity edema.  Sodium 128, potassium 3.5, chloride 94, bicarb 26, glucose 101, BUN 17, creatinine 0.70, white cell count 16.4, hemoglobin 8.6, hematocrit 26.5, platelets 156. SARS COVID-19 negative. Urinalysis specific gravity 1.019.  CT chest with small right pleural effusion, right base atelectasis, 9 mm lobulated nodular density in the right upper lobe. EKG 68 bpm, normal axis, normal intervals, sinus rhythm, Q-wave in V1-V2, no significant ST segment or T wave changes.  1.  Acute hypoxemic respiratory failure due to right-sided  pleurisy, associated small pleural effusion.  Patient was admitted to the medical ward, she received parenteral oxygen incentive spirometer with good toleration. Pulmonary infection was ruled out. She was successfully weaned off supplemental oxygen, at discharge her oxygenation was 96% on room air.  2.  Hyponatremia/hypokalemia.  Her kidney function remained stable, she has been taking hydrochlorothiazide as an outpatient for blood pressure control, Her urine specific gravity was 1.019, possible component of SIADH.  Diuretic therapy was discontinued, patient received salt tablets along with potassium chloride with improvement of electrolytes.  At discharge sodium 132, potassium 3.9, chloride 98, bicarb 25, glucose 130, BUN 12, creatinine 0.91. Follow-up kidney function as an outpatient.  Discontinue diuretics.  3.  Myelodysplastic syndrome, malignancy related anemia.  Patient will continue supportive therapy, including as needed blood product transfusions and continue erythropoietin. She had reactive leukocytosis. At discharge white cell count 13.1, hemoglobin 8.2, hematocrit 25.4, platelets 153.  Follow-up as an outpatient with oncology Dr. Annamaria Boots. Patient was evaluated by palliative care services.  4.  Hypertension.  Continue blood pressure control with metoprolol, hold hydrochlorothiazide.  Continue aspirin.  5.  GERD.  Continue pantoprazole.  Discharge Diagnoses:  Principal Problem:   Hypoxia Active Problems:   Essential hypertension, benign   GERD (gastroesophageal reflux disease)   Hypertension   MDS/MPN (myelodysplastic/myeloproliferative neoplasms) (HCC)   Hypokalemia    Discharge Instructions   Allergies as of 05/12/2020      Reactions   Doxycycline Nausea And Vomiting   Macrodantin [nitrofurantoin] Nausea And Vomiting   Sulfamethoxazole-trimethoprim Rash   REACTION: Rash after completing course of Septra      Medication List    STOP taking these medications  hydrochlorothiazide 25 MG tablet Commonly known as: HYDRODIURIL   potassium chloride SA 20 MEQ tablet Commonly known as: KLOR-CON     TAKE these medications   aspirin 81 MG tablet Take 81 mg by mouth daily.   calcium carbonate 1500 (600 Ca) MG Tabs tablet Commonly known as: OSCAL Take 600 mg of elemental calcium by mouth in the morning and at bedtime.   cholecalciferol 1000 units tablet Commonly known as: VITAMIN D Take 2,000 Units by mouth daily.   feeding supplement Liqd Take 237 mLs by mouth 2 (two) times daily between meals.   FISH OIL PO Take 1 capsule by mouth daily.   gabapentin 300 MG capsule Commonly known as: NEURONTIN TAKE 1 CAPSULE BY MOUTH EVERY MORNING AND TAKE 3 CAPSULES BY MOUTH EVERY EVENING What changed: See the new instructions.   GLUCOSAMINE-CHONDROITIN PO Take 2 tablets by mouth daily. 1500-1200   ibuprofen 200 MG tablet Commonly known as: ADVIL Take 400 mg by mouth as needed for fever, headache or mild pain.   Magnesium 500 MG Tabs Take 1 tablet by mouth daily.   metoprolol tartrate 25 MG tablet Commonly known as: LOPRESSOR TAKE 1/2 TABLET BY MOUTH TWICE A DAY   multivitamin with minerals Tabs tablet Take 1 tablet by mouth daily. Start taking on: May 13, 2020   omeprazole 20 MG capsule Commonly known as: PRILOSEC TAKE 1 CAPSULE BY MOUTH EVERY DAY What changed: how much to take   polyethylene glycol 17 g packet Commonly known as: MIRALAX / GLYCOLAX Take 17 g by mouth daily.   pyridOXINE 100 MG tablet Commonly known as: VITAMIN B-6 Take 100 mg by mouth daily.   solifenacin 10 MG tablet Commonly known as: VESICARE TAKE 1 TABLET BY MOUTH EVERY DAY What changed: how much to take       Allergies  Allergen Reactions  . Doxycycline Nausea And Vomiting  . Macrodantin [Nitrofurantoin] Nausea And Vomiting  . Sulfamethoxazole-Trimethoprim Rash    REACTION: Rash after completing course of Septra    Consultations:  Oncology    Palliative Care   Procedures/Studies: DG Ribs Unilateral W/Chest Right  Result Date: 05/10/2020 CLINICAL DATA:  right rib/chest pain EXAM: RIGHT RIBS AND CHEST - 3+ VIEW COMPARISON:  None. FINDINGS: No evidence of displaced rib fracture. There is no evidence of pneumothorax. There is a small right pleural effusion. Both lungs are clear. Heart size and mediastinal contours are within normal limits. IMPRESSION: Small right pleural effusion. No evidence of displaced rib fracture. Electronically Signed   By: Maurine Simmering   On: 05/10/2020 09:40   CT Angio Chest PE W/Cm &/Or Wo Cm  Result Date: 05/10/2020 CLINICAL DATA:  Right-sided chest pain for several days, initial encounter EXAM: CT ANGIOGRAPHY CHEST WITH CONTRAST TECHNIQUE: Multidetector CT imaging of the chest was performed using the standard protocol during bolus administration of intravenous contrast. Multiplanar CT image reconstructions and MIPs were obtained to evaluate the vascular anatomy. CONTRAST:  29mL OMNIPAQUE IOHEXOL 350 MG/ML SOLN COMPARISON:  Chest x-ray from earlier in the same day. FINDINGS: Cardiovascular: Thoracic aorta demonstrates atherosclerotic calcifications without aneurysmal dilatation. No significant enhancement is noted to rule out dissection. Heart is at the upper limits of normal in size. The pulmonary artery shows a normal branching pattern without definitive filling defect to suggest pulmonary embolism. Mediastinum/Nodes: Thoracic inlet is within normal limits. No sizable hilar or mediastinal adenopathy is noted. The esophagus is within normal limits. Lungs/Pleura: Bibasilar atelectatic changes are noted right greater than left with  associated right-sided pleural effusion. Somewhat lobulated nodular density is noted measuring 9 mm in the anterior aspect of the right upper lobe best seen on image number 46 of series 6. No other sizable nodules are seen. Upper Abdomen: Scattered prominent cysts are noted in the central  portion of the liver measuring up to 3.8 cm. Musculoskeletal: No chest wall abnormality. No acute or significant osseous findings. Review of the MIP images confirms the above findings. IMPRESSION: No evidence of pulmonary emboli. Bibasilar atelectasis with small right-sided pleural effusion. 9 mm lobulated nodular density in the right upper lobe. Consider one of the following in 3 months for both low-risk and high-risk individuals: (a) repeat chest CT, (b) follow-up PET-CT, or (c) tissue sampling. This recommendation follows the consensus statement: Guidelines for Management of Incidental Pulmonary Nodules Detected on CT Images: From the Fleischner Society 2017; Radiology 2017; 284:228-243. Aortic Atherosclerosis (ICD10-I70.0). Electronically Signed   By: Inez Catalina M.D.   On: 05/10/2020 12:32       Subjective: Patient is feeling better, no nausea or vomiting, no chest pain or dyspnea. Out of bed to chair.   Discharge Exam: Vitals:   05/11/20 2129 05/12/20 0638  BP: (!) 160/82 120/62  Pulse: 98 92  Resp: 18 18  Temp: 99.1 F (37.3 C) 97.8 F (36.6 C)  SpO2: 96% 96%   Vitals:   05/11/20 0600 05/11/20 1441 05/11/20 2129 05/12/20 0638  BP:  (!) 142/77 (!) 160/82 120/62  Pulse:   98 92  Resp:  18 18 18   Temp:  98.7 F (37.1 C) 99.1 F (37.3 C) 97.8 F (36.6 C)  TempSrc:  Oral Oral Oral  SpO2:   96% 96%  Weight: 53.6 kg   58.6 kg  Height:        General: Not in pain or dyspnea,.  Neurology: Awake and alert, non focal  E ENT: no pallor, no icterus, oral mucosa moist Cardiovascular: No JVD. S1-S2 present, rhythmic, no gallops, rubs, or murmurs. No lower extremity edema. Pulmonary: positive breath sounds bilaterally, with no wheezing, rhonchi or rales. Gastrointestinal. Abdomen soft and non tender Skin. No rashes Musculoskeletal: no joint deformities   The results of significant diagnostics from this hospitalization (including imaging, microbiology, ancillary and laboratory)  are listed below for reference.     Microbiology: Recent Results (from the past 240 hour(s))  SARS CORONAVIRUS 2 (TAT 6-24 HRS) Nasopharyngeal Nasopharyngeal Swab     Status: None   Collection Time: 05/10/20  3:18 PM   Specimen: Nasopharyngeal Swab  Result Value Ref Range Status   SARS Coronavirus 2 NEGATIVE NEGATIVE Final    Comment: (NOTE) SARS-CoV-2 target nucleic acids are NOT DETECTED.  The SARS-CoV-2 RNA is generally detectable in upper and lower respiratory specimens during the acute phase of infection. Negative results do not preclude SARS-CoV-2 infection, do not rule out co-infections with other pathogens, and should not be used as the sole basis for treatment or other patient management decisions. Negative results must be combined with clinical observations, patient history, and epidemiological information. The expected result is Negative.  Fact Sheet for Patients: SugarRoll.be  Fact Sheet for Healthcare Providers: https://www.woods-mathews.com/  This test is not yet approved or cleared by the [place] FDA and  has been authorized for detection and/or diagnosis of SARS-CoV-2 by FDA under an Emergency Use Authorization (EUA). This EUA will remain  in effect (meaning this test can be used) for the duration of the COVID-19 declaration under Se ction 564(b)(1) of the Act, 21  U.S.C. section 360bbb-3(b)(1), unless the authorization is terminated or revoked sooner.  Performed at [facility], [place] [address] [place], [place] [address]      Labs: BNP (last 3 results) No results for input(s): BNP in the last 8760 hours. Basic Metabolic Panel: Recent Labs  Lab 05/10/20 0906 05/11/20 0452 05/12/20 0809  NA 128* 129* 132*  K 3.5 3.0* 3.9  CL 94* 92* 98  CO2 26 26 25   GLUCOSE 101* 93 130*  BUN 17 12 12   CREATININE 0.70 0.63 0.91  CALCIUM 8.4* 8.3* 8.7*  MG  --  1.8  --   PHOS  --  3.8  --    Liver Function  Tests: Recent Labs  Lab 05/10/20 0906  AST 31  ALT 30  ALKPHOS 77  BILITOT 0.5  PROT 6.2*  ALBUMIN 3.7   No results for input(s): LIPASE, AMYLASE in the last 168 hours. No results for input(s): AMMONIA in the last 168 hours. CBC: Recent Labs  Lab 05/10/20 0906 05/11/20 0452  WBC 16.4* 13.1*  HGB 8.6* 8.2*  HCT 26.5* 25.4*  MCV 109.5* 110.0*  PLT 156 153   Cardiac Enzymes: No results for input(s): CKTOTAL, CKMB, CKMBINDEX, TROPONINI in the last 168 hours. BNP: Invalid input(s): POCBNP CBG: Recent Labs  Lab 05/12/20 0643  GLUCAP 104*   D-Dimer No results for input(s): DDIMER in the last 72 hours. Hgb A1c No results for input(s): HGBA1C in the last 72 hours. Lipid Profile No results for input(s): CHOL, HDL, LDLCALC, TRIG, CHOLHDL, LDLDIRECT in the last 72 hours. Thyroid function studies No results for input(s): TSH, T4TOTAL, T3FREE, THYROIDAB in the last 72 hours.  Invalid input(s): FREET3 Anemia work up No results for input(s): VITAMINB12, FOLATE, FERRITIN, TIBC, IRON, RETICCTPCT in the last 72 hours. Urinalysis    Component Value Date/Time   COLORURINE STRAW (A) 05/10/2020 1243   APPEARANCEUR CLEAR 05/10/2020 1243   LABSPEC 1.019 05/10/2020 1243   PHURINE 7.0 05/10/2020 1243   GLUCOSEU NEGATIVE 05/10/2020 1243   HGBUR NEGATIVE 05/10/2020 1243   HGBUR small 01/18/2010 [place] 05/10/2020 1243   BILIRUBINUR Negative 03/12/2020 [place] 05/10/2020 1243   PROTEINUR NEGATIVE 05/10/2020 1243   UROBILINOGEN negative (A) 03/12/2020 1437   UROBILINOGEN 0.2 01/18/2010 1456   NITRITE NEGATIVE 05/10/2020 1243   LEUKOCYTESUR NEGATIVE 05/10/2020 1243   Sepsis Labs Invalid input(s): PROCALCITONIN,  WBC,  LACTICIDVEN Microbiology Recent Results (from the past 240 hour(s))  SARS CORONAVIRUS 2 (TAT 6-24 HRS) Nasopharyngeal Nasopharyngeal Swab     Status: None   Collection Time: 05/10/20  3:18 PM   Specimen: Nasopharyngeal Swab   Result Value Ref Range Status   SARS Coronavirus 2 NEGATIVE NEGATIVE Final    Comment: (NOTE) SARS-CoV-2 target nucleic acids are NOT DETECTED.  The SARS-CoV-2 RNA is generally detectable in upper and lower respiratory specimens during the acute phase of infection. Negative results do not preclude SARS-CoV-2 infection, do not rule out co-infections with other pathogens, and should not be used as the sole basis for treatment or other patient management decisions. Negative results must be combined with clinical observations, patient history, and epidemiological information. The expected result is Negative.  Fact Sheet for Patients: SugarRoll.be  Fact Sheet for Healthcare Providers: https://www.woods-mathews.com/  This test is not yet approved or cleared by the [place] FDA and  has been authorized for detection and/or diagnosis of SARS-CoV-2 by FDA under an Emergency Use Authorization (EUA). This EUA will  remain  in effect (meaning this test can be used) for the duration of the COVID-19 declaration under Se ction 564(b)(1) of the Act, 21 U.S.C. section 360bbb-3(b)(1), unless the authorization is terminated or revoked sooner.  Performed at [facility], [place] [address] [place], [place] [address]      Time coordinating discharge: 45 minutes  SIGNED:   Tawni Millers, MD  [facility] 05/12/2020, 10:53 AM

## 2020-05-12 NOTE — Consult Note (Signed)
Consultation Note Date: 05/12/2020   Patient Name: Jill Myers  DOB: Oct 19, 1930  MRN: [id number]  Age / Sex: 87 y.o., female  PCP: Mast, [name], NP Referring Physician: Tawni Myers  Reason for Consultation: Establishing goals of care  HPI/Patient Profile: 87 y.o. female  with past medical history of essential thrombocytopenia, MPN/MDS syndrome, significant anemia requiring transfusion, hypertension, GERD, lives long-term at friend's home admitted on 05/10/2020 with pleuritic chest pain secondary to effusion, MPN/MDS, anemia.  Palliative consulted for goals of care.  Clinical Assessment and Goals of Care: I met today with Jill Myers. We discussed clinical course as well as wishes moving forward in regard to advanced directives.  Concepts specific to code status and rehospitalization discussed.  We discussed difference between a aggressive medical intervention path and a palliative, comfort focused care path.  Values and goals of care important to patient and family were attempted to be elicited.  Concept of Hospice and Palliative Care were discussed  Questions and concerns addressed.   PMT will continue to support holistically.  SUMMARY OF RECOMMENDATIONS   -DNR/DNI -Continue current interventions.  She discussed with Dr. Burr Medico and plan is to transition back to friend's home with continued follow-up for supportive care including blood transfusion as needed.  She is not interested in chemotherapy.  Overall, her goals for improved quality of life.  We discussed that, if she reaches a point where she does not feel that she is continuing to benefit from transfusions, she can always elect to discontinue these as well at which point in time I recommend electing her hospice benefits.  She would not be a candidate for hospice as long as she is receiving recurrent transfusions.  She understands this  fact. -Recommend outpatient follow-up with palliative care at friend's home.  Code Status/Advance Care Planning:  DNR  Symptom management -Chest pain: Oxycodone as needed.  Palliative Prophylaxis:   Frequent Pain Assessment  Additional Recommendations (Limitations, Scope, Preferences):  Avoid Hospitalization  Psycho-social/Spiritual:   Desire for further Chaplaincy support:no  Additional Recommendations: Caregiving  Support/Resources  Prognosis:   Unable to determine as this will depend upon her clinical course and desire to continue with transfusions  Discharge Planning: Home with Palliative Services      Primary Diagnoses: Present on Admission: . Hypoxia . Essential hypertension, benign . GERD (gastroesophageal reflux disease) . MDS/MPN (myelodysplastic/myeloproliferative neoplasms) ([place]) . Hypertension   I have reviewed the medical record, interviewed the patient and family, and examined the patient. The following aspects are pertinent.  Past Medical History:  Diagnosis Date  . Bladder cystocele 05/21/2010  . Complication of anesthesia    deglutition in history  . Deglutition disorder 08/17/2012   Single episode of choking/aspiration; preceded by trouble swallowing saliva (thin liquids).  For SLP evaluation. Aug 17, 2012.    [name] Disease of blood and blood forming organ 01/05/2007   Qualifier: Diagnosis of  By: Oneida Alar MD, KARL    . Diverticulitis   . DIVERTICULOSIS OF COLON 03/05/2006   Qualifier: Diagnosis of  By: Samara Snide    . DIZZINESS 01/18/2010   Qualifier: Diagnosis of  By: Lindell Noe MD, Jeneen Rinks    . Essential hypertension, benign 02/22/2008   Qualifier: Diagnosis of  By: Lindell Noe MD, Jeneen Rinks     . Essential thrombocythemia ([place]) 01/31/11  . Family history of Alzheimer's disease 11/11/2013   Patient with family history of Alzheimers disease. She herself is very highly functional.  Plans for future visit with [facility] as primary function of the visit.    [name] GERD  (gastroesophageal reflux disease) 08/16/2010  . HALLUX VALGUS, ACQUIRED 04/10/2009   Qualifier: Diagnosis of  By: [name] CMA,, [place]    . Hypertension   . HYPERTRIGLYCERIDEMIA 01/05/2007   Qualifier: Diagnosis of  By: Oneida Alar MD, KARL    . Insomnia 02/10/2014  . Left-sided chest wall pain 12/19/2011    L sided chest wall pain follows a dermatomal distribution and raises the possibility of thoracic radiculopathic pain, which would be expected to improve with gabapentin as well. We discussed this at length and she will call to make me aware if this does not get better. Rib belt/girdle in the meantime; may consider rib films or CXR if not improving.     . Metatarsalgia of right foot 11/28/2013   Referral to [facility]   . Osteopenia   . Personal history of radiation therapy    03/2017 right breast  . Restless leg syndrome 06/09/2011  . Restless legs syndrome (RLS)   . URINARY INCONTINENCE, URGE, MILD 11/27/2009   Qualifier: Diagnosis of  By: Zebedee Iba NP, Manuela Schwartz    . Vitamin D deficiency 08/16/2010   Social History   Socioeconomic History  . Marital status: Widowed    Spouse name: Not on file  . Number of children: 2  . Years of education: bachelors  . Highest education level: Not on file  Occupational History  . Occupation: Tree surgeon: UNEMPLOYED  . Occupation: Retired- Paramedic  Tobacco Use  . Smoking status: Former Smoker    Years: 3.00    Quit date: 1958    Years since quitting: 64.3  . Smokeless tobacco: Never Used  Vaping Use  . Vaping Use: Never used  Substance and Sexual Activity  . Alcohol use: Yes    Alcohol/week: 0.0 standard drinks    Comment: Wine rarely.  . Drug use: No  . Sexual activity: Not Currently  Other Topics Concern  . Not on file  Social History Narrative   Health Care POA: [name]- Son   Emergency Contact: neighbor, [name] [phone]   End of Life Plan: POA, Living Will   Widow   Lives at [facility] since about 2013.    Any pets: none   Diet: Pt has a varied diet of protein, starch, vegetables   Exercise: Pt exercises 3x week for 1 hour with group.   Seatbelts: Pt reports wearing seatbelt when in vehicles.    Sun Exposure/Protection: pt does not wear sun protection   Hobbies: opera, symphony, reading, walking      Social Determinants of Health   Financial Resource Strain: Not on file  Food Insecurity: Not on file  Transportation Needs: Not on file  Physical Activity: Not on file  Stress: Not on file  Social Connections: Not on file   Family History  Problem Relation Age of Onset  . Heart disease Mother   . Tuberculosis Father   . Heart disease Sister   . Heart disease Brother   .  Alzheimer's disease Brother   . Alzheimer's disease Brother    Scheduled Meds: . aspirin EC  81 mg Oral Daily  . calcium carbonate  1 tablet Oral Q breakfast  . cholecalciferol  2,000 Units Oral Daily  . feeding supplement  237 mL Oral BID BM  . gabapentin  300 mg Oral Daily  . gabapentin  900 mg Oral QHS  . magnesium gluconate  500 mg Oral Daily  . metoprolol tartrate  12.5 mg Oral BID  . multivitamin with minerals  1 tablet Oral Daily  . pantoprazole  40 mg Oral Daily  . polyethylene glycol  17 g Oral Daily  . pyridOXINE  100 mg Oral Daily  . sodium chloride  1 g Oral TID WC   Continuous Infusions: PRN Meds:.albuterol, ibuprofen, oxyCODONE Medications Prior to Admission:  Prior to Admission medications   Medication Sig Start Date End Date Taking? Authorizing Provider  aspirin 81 MG tablet Take 81 mg by mouth daily.   Yes [provider]  calcium carbonate (OSCAL) 1500 (600 Ca) MG TABS tablet Take 600 mg of elemental calcium by mouth in the morning and at bedtime.    Yes [provider]  cholecalciferol (VITAMIN D) 1000 UNITS tablet Take 2,000 Units by mouth daily.  03/17/11  Yes [provider]  gabapentin (NEURONTIN) 300 MG capsule TAKE 1 CAPSULE BY MOUTH EVERY MORNING AND  TAKE 3 CAPSULES BY MOUTH EVERY EVENING Patient taking differently: Take 300-900 mg by mouth See admin instructions. TAKE 330m BY MOUTH EVERY MORNING AND TAKE 9049mBY MOUTH EVERY EVENING 04/02/20  Yes Mast, [name], NP  GLUCOSAMINE-CHONDROITIN PO Take 2 tablets by mouth daily. 1500-1200 03/17/11  Yes [provider]  hydrochlorothiazide (HYDRODIURIL) 25 MG tablet TAKE 1 TABLET BY MOUTH EVERY DAY Patient taking differently: Take 25 mg by mouth daily. 02/06/20  Yes Mast, [name], NP  ibuprofen (ADVIL,MOTRIN) 200 MG tablet Take 400 mg by mouth as needed for fever, headache or mild pain. 03/17/11  Yes [provider]  Magnesium 500 MG TABS Take 1 tablet by mouth daily.   Yes [provider]  metoprolol tartrate (LOPRESSOR) 25 MG tablet TAKE 1/2 TABLET BY MOUTH TWICE A DAY Patient taking differently: Take 12.5 mg by mouth 2 (two) times daily. 01/17/20  Yes Mast, [name], NP  Omega-3 Fatty Acids (FISH OIL PO) Take 1 capsule by mouth daily.   Yes [provider]  omeprazole (PRILOSEC) 20 MG capsule TAKE 1 CAPSULE BY MOUTH EVERY DAY Patient taking differently: Take 20 mg by mouth daily. 01/04/20  Yes Mast, [name], NP  polyethylene glycol (MIRALAX / GLYCOLAX) packet Take 17 g by mouth daily.   Yes [provider]  potassium chloride SA (KLOR-CON) 20 MEQ tablet Take 20 mEq by mouth daily.   Yes [provider]  pyridOXINE (VITAMIN B-6) 100 MG tablet Take 100 mg by mouth daily.   Yes [provider]  solifenacin (VESICARE) 10 MG tablet TAKE 1 TABLET BY MOUTH EVERY DAY Patient taking differently: Take 5 mg by mouth daily. 05/04/20  Yes Mast, [name], NP   Allergies  Allergen Reactions  . Doxycycline Nausea And Vomiting  . Macrodantin [Nitrofurantoin] Nausea And Vomiting  . Sulfamethoxazole-Trimethoprim Rash    REACTION: Rash after completing course of Septra   Review of Systems  Physical Exam  General: Alert, awake, in no acute distress.  HEENT: No  bruits, no goiter, no JVD Heart: Regular rate and rhythm. No murmur appreciated.  Lungs: Good air movement, clear Abdomen: Soft, nontender, nondistended, positive bowel sounds.  Ext: No significant edema Skin: Warm and dry Neuro: Grossly intact, nonfocal.  Vital Signs: BP 120/62 (BP Location: Left Arm)   Pulse 92   Temp 97.8 F (36.6 C) (Oral)   Resp 18   Ht _0  (1.6 m)   Wt 58.6 kg   SpO2 96%   BMI 22.88 kg/m  Pain Scale: 0-10 POSS *See Group Information*: 1-Acceptable,Awake and alert Pain Score: 0-No pain   SpO2: SpO2: 96 % O2 Device:SpO2: 96 % O2 Flow Rate: .O2 Flow Rate (L/min): 3 L/min  IO: Intake/output summary:   Intake/Output Summary (Last 24 hours) at 05/12/2020 0908 Last data filed at 05/12/2020 0741 Gross per 24 hour  Intake 120 ml  Output --  Net 120 ml    LBM: Last BM Date: 05/10/20 Baseline Weight: Weight: 54.4 kg Most recent weight: Weight: 58.6 kg     Palliative Assessment/Data:   Flowsheet Rows   Flowsheet Row Most Recent Value  Intake Tab   Referral Department Hospitalist  Unit at Time of Referral Med/Surg Unit  Palliative Care Primary Diagnosis Cancer  Date Notified 05/10/20  Palliative Care Type New Palliative care  Reason for referral Clarify Goals of Care  Date of Admission 05/10/20  Date first seen by Palliative Care 05/11/20  # of days Palliative referral response time 1 Day(s)  # of days IP prior to Palliative referral 0  Clinical Assessment   Palliative Performance Scale Score 40%  Psychosocial & Spiritual Assessment   Palliative Care Outcomes   Patient/Family meeting held? Yes  Palliative Care Outcomes Clarified goals of care      Time In: 1630 Time Out: 1730 Time Total: 60 Greater than 50%  of this time was spent counseling and coordinating care related to the above assessment and plan.  Signed by: Micheline Rough, MD   Please contact Palliative Medicine Team phone at [phone] for questions and concerns.  For individual  provider: See Shea Evans

## 2020-05-14 ENCOUNTER — Telehealth: Payer: Self-pay | Admitting: *Deleted

## 2020-05-14 NOTE — Telephone Encounter (Signed)
Mast, [name], NP  You 17 minutes ago (10:03 AM)   The patient needs to be seen by a provider. She is an independent resident at [facility]. Thanks.    Message text      Patient notified and stated that she has an appointment and that she would just wait till then. Cannot come into the office.

## 2020-05-14 NOTE — Telephone Encounter (Signed)
Patient called and stated that she is hurting from gas pains. Stated that it hurts when she catches her breath. Stated that it is hurting up through her chest. No other symptoms noted. Was just discharged from the hospital.  Patient is thinking it is from her constipation but states that she just had a bowel movement this morning and hoping that relieves the pain. Taking nothing for the constipation.  The hospital only gave her Ibuprofen to take for the pain.   Patient is requesting something to be called in for the pain.  No available appointment for [facility].  Offered patient an appointment here at the office and she stated that she was advised not to drive and doesn't want to come to the office.   Please Advise.

## 2020-05-17 ENCOUNTER — Other Ambulatory Visit: Payer: Self-pay | Admitting: Nurse Practitioner

## 2020-05-17 ENCOUNTER — Encounter: Payer: Self-pay | Admitting: *Deleted

## 2020-05-17 ENCOUNTER — Inpatient Hospital Stay: Payer: Medicare PPO | Attending: Hematology

## 2020-05-17 ENCOUNTER — Inpatient Hospital Stay: Payer: Medicare PPO | Admitting: Hematology

## 2020-05-17 ENCOUNTER — Encounter: Payer: Self-pay | Admitting: Hematology

## 2020-05-17 ENCOUNTER — Non-Acute Institutional Stay: Payer: Medicare PPO | Admitting: Nurse Practitioner

## 2020-05-17 ENCOUNTER — Other Ambulatory Visit: Payer: Self-pay

## 2020-05-17 ENCOUNTER — Encounter: Payer: Self-pay | Admitting: Nurse Practitioner

## 2020-05-17 ENCOUNTER — Inpatient Hospital Stay: Payer: Medicare PPO

## 2020-05-17 ENCOUNTER — Ambulatory Visit: Payer: Medicare PPO | Admitting: Nurse Practitioner

## 2020-05-17 VITALS — BP 136/66 | HR 84 | Temp 97.3°F | Resp 18 | Ht 63.0 in | Wt 117.4 lb

## 2020-05-17 VITALS — BP 134/65 | HR 83 | Temp 98.2°F | Resp 18 | Ht 63.0 in | Wt 117.7 lb

## 2020-05-17 DIAGNOSIS — D469 Myelodysplastic syndrome, unspecified: Secondary | ICD-10-CM

## 2020-05-17 DIAGNOSIS — E871 Hypo-osmolality and hyponatremia: Secondary | ICD-10-CM | POA: Diagnosis not present

## 2020-05-17 DIAGNOSIS — M85851 Other specified disorders of bone density and structure, right thigh: Secondary | ICD-10-CM

## 2020-05-17 DIAGNOSIS — D473 Essential (hemorrhagic) thrombocythemia: Secondary | ICD-10-CM

## 2020-05-17 DIAGNOSIS — C50211 Malignant neoplasm of upper-inner quadrant of right female breast: Secondary | ICD-10-CM | POA: Diagnosis not present

## 2020-05-17 DIAGNOSIS — Z170 Estrogen receptor positive status [ER+]: Secondary | ICD-10-CM

## 2020-05-17 DIAGNOSIS — D649 Anemia, unspecified: Secondary | ICD-10-CM

## 2020-05-17 DIAGNOSIS — M8949 Other hypertrophic osteoarthropathy, multiple sites: Secondary | ICD-10-CM

## 2020-05-17 DIAGNOSIS — Z7982 Long term (current) use of aspirin: Secondary | ICD-10-CM | POA: Diagnosis not present

## 2020-05-17 DIAGNOSIS — G2581 Restless legs syndrome: Secondary | ICD-10-CM | POA: Diagnosis not present

## 2020-05-17 DIAGNOSIS — Z853 Personal history of malignant neoplasm of breast: Secondary | ICD-10-CM | POA: Insufficient documentation

## 2020-05-17 DIAGNOSIS — M25461 Effusion, right knee: Secondary | ICD-10-CM | POA: Diagnosis not present

## 2020-05-17 DIAGNOSIS — K5909 Other constipation: Secondary | ICD-10-CM

## 2020-05-17 DIAGNOSIS — Z79899 Other long term (current) drug therapy: Secondary | ICD-10-CM | POA: Insufficient documentation

## 2020-05-17 DIAGNOSIS — I10 Essential (primary) hypertension: Secondary | ICD-10-CM

## 2020-05-17 DIAGNOSIS — R911 Solitary pulmonary nodule: Secondary | ICD-10-CM | POA: Insufficient documentation

## 2020-05-17 DIAGNOSIS — M159 Polyosteoarthritis, unspecified: Secondary | ICD-10-CM

## 2020-05-17 DIAGNOSIS — D0512 Intraductal carcinoma in situ of left breast: Secondary | ICD-10-CM | POA: Diagnosis not present

## 2020-05-17 DIAGNOSIS — N3941 Urge incontinence: Secondary | ICD-10-CM

## 2020-05-17 DIAGNOSIS — J90 Pleural effusion, not elsewhere classified: Secondary | ICD-10-CM | POA: Diagnosis not present

## 2020-05-17 DIAGNOSIS — G5763 Lesion of plantar nerve, bilateral lower limbs: Secondary | ICD-10-CM | POA: Diagnosis not present

## 2020-05-17 DIAGNOSIS — K219 Gastro-esophageal reflux disease without esophagitis: Secondary | ICD-10-CM

## 2020-05-17 LAB — SAMPLE TO BLOOD BANK

## 2020-05-17 LAB — CBC WITH DIFFERENTIAL (CANCER CENTER ONLY)
Abs Immature Granulocytes: 7.9 10*3/uL — ABNORMAL HIGH (ref 0.00–0.07)
Band Neutrophils: 11 %
Basophils Absolute: 0.5 10*3/uL — ABNORMAL HIGH (ref 0.0–0.1)
Basophils Relative: 2 %
Eosinophils Absolute: 0.8 10*3/uL — ABNORMAL HIGH (ref 0.0–0.5)
Eosinophils Relative: 3 %
HCT: 24 % — ABNORMAL LOW (ref 36.0–46.0)
Hemoglobin: 8 g/dL — ABNORMAL LOW (ref 12.0–15.0)
Lymphocytes Relative: 18 %
Lymphs Abs: 4.7 10*3/uL — ABNORMAL HIGH (ref 0.7–4.0)
MCH: 35.7 pg — ABNORMAL HIGH (ref 26.0–34.0)
MCHC: 33.3 g/dL (ref 30.0–36.0)
MCV: 107.1 fL — ABNORMAL HIGH (ref 80.0–100.0)
Metamyelocytes Relative: 11 %
Monocytes Absolute: 0.3 10*3/uL (ref 0.1–1.0)
Monocytes Relative: 1 %
Myelocytes: 19 %
Neutro Abs: 12.1 10*3/uL — ABNORMAL HIGH (ref 1.7–7.7)
Neutrophils Relative %: 35 %
Platelet Count: 176 10*3/uL (ref 150–400)
RBC: 2.24 MIL/uL — ABNORMAL LOW (ref 3.87–5.11)
RDW: 22.1 % — ABNORMAL HIGH (ref 11.5–15.5)
WBC Count: 26.3 10*3/uL — ABNORMAL HIGH (ref 4.0–10.5)
nRBC: 0.6 % — ABNORMAL HIGH (ref 0.0–0.2)

## 2020-05-17 MED ORDER — EPOETIN ALFA-EPBX 40000 UNIT/ML IJ SOLN: 20000.0000 [IU] | Freq: Once | INTRAMUSCULAR | Status: DC

## 2020-05-17 MED ORDER — EPOETIN ALFA-EPBX 10000 UNIT/ML IJ SOLN: 10000.0000 [IU] | Freq: Once | INTRAMUSCULAR | Status: DC

## 2020-05-17 MED ORDER — EPOETIN ALFA-EPBX 10000 UNIT/ML IJ SOLN: 20000.0000 [IU] | INTRAMUSCULAR | Status: DC

## 2020-05-17 MED ORDER — EPOETIN ALFA-EPBX 10000 UNIT/ML IJ SOLN
INTRAMUSCULAR | Status: AC
  Filled 2020-05-17: qty 2

## 2020-05-17 MED ORDER — EPOETIN ALFA-EPBX 10000 UNIT/ML IJ SOLN
20000.0000 [IU] | Freq: Once | INTRAMUSCULAR | Status: AC
Start: 2020-05-17 — End: 2020-05-17
  Administered 2020-05-17: 20000 [IU] via SUBCUTANEOUS

## 2020-05-17 MED ORDER — ACETAMINOPHEN-CODEINE #3 300-30 MG PO TABS: 1.0000 | ORAL_TABLET | ORAL | 0 refills | Status: AC | PRN

## 2020-05-17 NOTE — Assessment & Plan Note (Signed)
Angio chest 5//2022 Bibasilar atelectasis with small right-sided pleural effusion.

## 2020-05-17 NOTE — Assessment & Plan Note (Signed)
takes Zometa.  

## 2020-05-17 NOTE — Assessment & Plan Note (Signed)
stable, on Gabapentin 300mg qam 900mg qpm.  

## 2020-05-17 NOTE — Assessment & Plan Note (Signed)
stable, on MiraLax qd, Colace qd.   

## 2020-05-17 NOTE — Assessment & Plan Note (Signed)
9 mm lobulated nodular density in the right upper lobe. Consider one of the following in 3 months

## 2020-05-17 NOTE — Patient Instructions (Signed)
F/u sodium level and right upper lobe lung nodule at cancer center.

## 2020-05-17 NOTE — Progress Notes (Signed)
Location:   clinic [place]   Place of Service:  Clinic (12) Provider: Marlana Latus NP  Code Status: DNR Goals of Care: [place] 05/17/2020  Does Patient Have a Medical Advance Directive? Yes  Type of Paramedic of [place];Living will  Does patient want to make changes to medical advance directive? No - Patient declined  Copy of [place] in Chart? Yes - validated most recent copy scanned in chart (See row information)  Would patient like information on creating a medical advance directive? -     Chief Complaint  Patient presents with  . Hospitalization Follow-up    Patient was released 05/12/2020. Patient has no other concerns today.    HPI: Patient is a 87 y.o. female seen today for medical management of chronic diseases.    Hospitalized 05/10/20-05/12/20 for hypoxia, chest wall pain, hyponatremia/Na 128-improved, angio chest CT No evidence of pulmonary emboli.   Bibasilar atelectasis with small right-sided pleural effusion.   9 mm lobulated nodular density in the right upper lobe. Consider one of the following in 3 months, desires f/u oncology  dc'd HCTZ/K in hospital.              Prepatellar bursitis of the right knee, chronic. Takes prn Ibuprofen.              The R+L ball part feet pain when walking, not new, Hx of Metatarsalgia, Morton's neuroma Bilateral breastcancer, s/p lumpectomy, radiation, off Adjuvant, f/u oncology.  HTN, Metoprolol 12.61m bid, Bun/creat 12/0.91 05/12/20 Hx of RLS, stable, on Gabapentin 3013mqam 90057mpm. Essential thrombocythemia stable, off hydroxyurea, , plt 176 05/17/20             Anemia, Hgb 8.0 05/17/20  f/u oncology, on Erythropoietin GERD, stable, on Omeprazole. Constipation, stable, on MiraLax qd, Colace qd.  Urinary frequency, stable, on Vesicare 83m39m.  Lower back  pain, radiculopathy R, Ortho 04/28/19,Ibuprofen, Gabapentin,Vit B6 Hyponatremia, Na 132 05/12/20 Osteopenia, takes Zometa.  Past Medical History:  Diagnosis Date  . Bladder cystocele 05/21/2010  . Complication of anesthesia    deglutition in history  . Deglutition disorder 08/17/2012   Single episode of choking/aspiration; preceded by trouble swallowing saliva (thin liquids).  For SLP evaluation. Aug 17, 2012.    . 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 Diverticulitis   . DIVERTICULOSIS OF COLON 03/05/2006   Qualifier: Diagnosis of  By: OdelSamara Snide. DIZZINESS 01/18/2010   Qualifier: Diagnosis of  By: BreeLindell Noe JameJeneen Rinks. Essential hypertension, benign 02/22/2008   Qualifier: Diagnosis of  By: BreeLindell Noe JameJeneen Rinks . Essential thrombocythemia (HCC)[place]25/13  . Family history of Alzheimer's disease 11/11/2013   Patient with family history of Alzheimers disease. She herself is very highly functional.  Plans for future visit with MMSE as primary function of the visit.    . GEMarland KitchenD (gastroesophageal reflux disease) 08/16/2010  . HALLUX VALGUS, ACQUIRED 04/10/2009   Qualifier: Diagnosis of  By: SladJavier Glazier,, Thekla    . Hypertension   . HYPERTRIGLYCERIDEMIA 01/05/2007   Qualifier: Diagnosis of  By: FIELOneida Alar KARL    . Insomnia 02/10/2014  . Left-sided chest wall pain 12/19/2011    L sided chest wall pain follows a dermatomal distribution and raises the possibility of thoracic radiculopathic pain, which would be expected to improve with gabapentin as  well. We discussed this at length and she will call to make me aware if this does not get better. Rib belt/girdle in the meantime; may consider rib films or CXR if not improving.     . Metatarsalgia of right foot 11/28/2013   Referral to [facility]   . Osteopenia   . Personal history of radiation therapy    03/2017 right breast  . Restless leg syndrome 06/09/2011  . Restless  legs syndrome (RLS)   . URINARY INCONTINENCE, URGE, MILD 11/27/2009   Qualifier: Diagnosis of  By: Zebedee Iba NP, Manuela Schwartz    . Vitamin D deficiency 08/16/2010    Past Surgical History:  Procedure Laterality Date  . BIOPSY BREAST Left 1998   Dr. Rebekah Chesterfield  . BREAST EXCISIONAL BIOPSY    . BREAST LUMPECTOMY Right 02/20/2017   Procedure: RIGHT BREAST LUMPECTOMY;  Surgeon: Jovita Kussmaul, MD;  Location: [place];  Service: General;  Laterality: Right;  . BREAST LUMPECTOMY Left 02/20/2017   left lumpectomy  . BREAST LUMPECTOMY WITH RADIOACTIVE SEED LOCALIZATION Left 02/20/2017   Procedure: LEFT BREAST LUMPECTOMY WITH RADIOACTIVE SEED LOCALIZATION;  Surgeon: Jovita Kussmaul, MD;  Location: [place];  Service: General;  Laterality: Left;  . CATARACT EXTRACTION  2010   Dr. [name]  . CHOLECYSTECTOMY  1992  . COLONOSCOPY  2008  . MOHS SURGERY  2012   on face [facility] [place]  . NAILBED REPAIR  12/05/2010   Procedure: NAILBED REPAIR;  Surgeon: Cammie Sickle., MD;  Location: [place];  Service: Orthopedics;  Laterality: Right;  full thickness nail biopsy right hallux  . torn tenden  2012   hand Dr. Orie Rout    Allergies  Allergen Reactions  . Doxycycline Nausea And Vomiting  . Macrodantin [Nitrofurantoin] Nausea And Vomiting  . Sulfamethoxazole-Trimethoprim Rash    REACTION: Rash after completing course of Septra    Allergies as of 05/17/2020      Reactions   Doxycycline Nausea And Vomiting   Macrodantin [nitrofurantoin] Nausea And Vomiting   Sulfamethoxazole-trimethoprim Rash   REACTION: Rash after completing course of Septra      Medication List       Accurate as of May 17, 2020 11:59 PM. If you have any questions, ask your nurse or doctor.        acetaminophen-codeine 300-30 MG tablet Commonly known as: TYLENOL #3 Take 1 tablet by mouth every 4 (four) hours as needed for moderate pain. Started by: Truitt Merle, MD    aspirin 81 MG tablet Take 81 mg by mouth daily.   calcium carbonate 1500 (600 Ca) MG Tabs tablet Commonly known as: OSCAL Take 600 mg of elemental calcium by mouth in the morning and at bedtime.   cholecalciferol 1000 units tablet Commonly known as: VITAMIN D Take 2,000 Units by mouth daily.   feeding supplement Liqd Take 237 mLs by mouth 2 (two) times daily between meals.   FISH OIL PO Take 1 capsule by mouth daily.   gabapentin 300 MG capsule Commonly known as: NEURONTIN TAKE 1 CAPSULE BY MOUTH EVERY MORNING AND TAKE 3 CAPSULES BY MOUTH EVERY EVENING What changed: See the new instructions.   GLUCOSAMINE-CHONDROITIN PO Take 2 tablets by mouth daily. 1500-1200   ibuprofen 200 MG tablet Commonly known as: ADVIL Take 400 mg by mouth as needed for fever, headache or mild pain.   Magnesium 500 MG Tabs Take 1 tablet by mouth daily.   metoprolol tartrate 25 MG tablet  Commonly known as: LOPRESSOR TAKE 1/2 TABLET BY MOUTH TWICE A DAY   multivitamin with minerals Tabs tablet Take 1 tablet by mouth daily.   omeprazole 20 MG capsule Commonly known as: PRILOSEC TAKE 1 CAPSULE BY MOUTH EVERY DAY What changed: how much to take   polyethylene glycol 17 g packet Commonly known as: MIRALAX / GLYCOLAX Take 17 g by mouth daily.   pyridOXINE 100 MG tablet Commonly known as: VITAMIN B-6 Take 100 mg by mouth daily.   solifenacin 10 MG tablet Commonly known as: VESICARE TAKE 1 TABLET BY MOUTH EVERY DAY What changed: how much to take       Review of Systems:  Review of Systems  Constitutional: Negative for activity change, appetite change, fatigue and fever.       Decreased appetite, declined meds.   HENT: Positive for hearing loss and postnasal drip. Negative for congestion and voice change.   Eyes: Negative for visual disturbance.  Respiratory: Positive for cough. Negative for shortness of breath.        Chronic cough  Cardiovascular: Positive for leg swelling.  Negative for chest pain and palpitations.  Gastrointestinal: Negative for abdominal pain and constipation.  Genitourinary: Positive for frequency. Negative for dysuria and urgency.  Musculoskeletal: Positive for arthralgias, back pain, gait problem and joint swelling.       R+L foot MTJ pain, top of feet pain  chronic. Right lower back, right hip, right leg pain is chronic, positional improved. Right knee pain/prepetellar effusion-s/p aspiration x2  Skin: Negative for color change.  Neurological: Negative for speech difficulty, weakness and light-headedness.  Psychiatric/Behavioral: Negative for behavioral problems and sleep disturbance. The patient is not nervous/anxious.     Health Maintenance  Topic Date Due  . TETANUS/TDAP  04/11/2019  . COVID-19 Vaccine (4 - Booster for Moderna series) 02/15/2020  . INFLUENZA VACCINE  08/06/2020  . DEXA SCAN  Completed  . PNA vac Low Risk Adult  Completed  . HPV VACCINES  Aged Out    Physical Exam: Vitals:   05/17/20 1521  BP: 136/66  Pulse: 84  Resp: 18  Temp: (!) 97.3 F (36.3 C)  SpO2: 96%  Weight: 117 lb 6.4 oz (53.3 kg)  Height: 5' 3"  (1.6 m)   Body mass index is 20.8 kg/m. Physical Exam Vitals and nursing note reviewed.  Constitutional:      Appearance: Normal appearance.  HENT:     Head: Normocephalic and atraumatic.     Mouth/Throat:     Mouth: Mucous membranes are moist.  Eyes:     Extraocular Movements: Extraocular movements intact.     Conjunctiva/sclera: Conjunctivae normal.     Pupils: Pupils are equal, round, and reactive to light.  Cardiovascular:     Rate and Rhythm: Normal rate and regular rhythm.     Heart sounds: Murmur heard.    Pulmonary:     Effort: Pulmonary effort is normal.     Breath sounds: No rales.  Abdominal:     General: Bowel sounds are normal.     Palpations: Abdomen is soft.     Tenderness: There is no abdominal tenderness.  Musculoskeletal:        General: Swelling and tenderness  present.     Cervical back: Normal range of motion and neck supple.     Right lower leg: Edema present.     Left lower leg: Edema present.     Comments: 1+ edema BLE. Lower back pain, radiculopathy R, Ortho 04/28/19. Right  knee pain/prepetellar effusion-s/p aspiration x2   Skin:    General: Skin is warm and dry.     Coloration: Skin is pale.  Neurological:     General: No focal deficit present.     Mental Status: She is alert and oriented to person, place, and time. Mental status is at baseline.     Motor: No weakness.     Coordination: Coordination normal.     Gait: Gait abnormal.  Psychiatric:        Mood and Affect: Mood normal.        Behavior: Behavior normal.        Thought Content: Thought content normal.        Judgment: Judgment normal.     Labs reviewed: Basic Metabolic Panel: Recent Labs    05/10/20 0906 05/11/20 0452 05/12/20 0809  NA 128* 129* 132*  K 3.5 3.0* 3.9  CL 94* 92* 98  CO2 26 26 25   GLUCOSE 101* 93 130*  BUN 17 12 12   CREATININE 0.70 0.63 0.91  CALCIUM 8.4* 8.3* 8.7*  MG  --  1.8  --   PHOS  --  3.8  --    Liver Function Tests: Recent Labs    02/22/20 1000 04/12/20 0837 05/10/20 0906  AST 22 30 31   ALT 31 31 30   ALKPHOS 42 65 77  BILITOT 0.7 0.8 0.5  PROT 6.4* 6.2* 6.2*  ALBUMIN 3.8 3.8 3.7   No results for input(s): LIPASE, AMYLASE in the last 8760 hours. No results for input(s): AMMONIA in the last 8760 hours. CBC: Recent Labs    04/17/20 1012 04/17/20 1012 05/02/20 1024 05/10/20 0906 05/11/20 0452 05/17/20 1026  WBC 13.8*   < > 17.9* 16.4* 13.1* 26.3*  NEUTROABS 5.0  --  5.4  --   --  12.1*  HGB 8.5*   < > 7.2* 8.6* 8.2* 8.0*  HCT 26.0*  --  22.2* 26.5* 25.4* 24.0*  MCV 114.0*  --  115.0* 109.5* 110.0* 107.1*  PLT 219   < > 179 156 153 176   < > = values in this interval not displayed.   Lipid Panel: No results for input(s): CHOL, HDL, LDLCALC, TRIG, CHOLHDL, LDLDIRECT in the last 8760 hours. No results found for:  HGBA1C  Procedures since last visit: DG Ribs Unilateral W/Chest Right  Result Date: 05/10/2020 CLINICAL DATA:  right rib/chest pain EXAM: RIGHT RIBS AND CHEST - 3+ VIEW COMPARISON:  None. FINDINGS: No evidence of displaced rib fracture. There is no evidence of pneumothorax. There is a small right pleural effusion. Both lungs are clear. Heart size and mediastinal contours are within normal limits. IMPRESSION: Small right pleural effusion. No evidence of displaced rib fracture. Electronically Signed   By: Maurine Simmering   On: 05/10/2020 09:40   CT Angio Chest PE W/Cm &/Or Wo Cm  Result Date: 05/10/2020 CLINICAL DATA:  Right-sided chest pain for several days, initial encounter EXAM: CT ANGIOGRAPHY CHEST WITH CONTRAST TECHNIQUE: Multidetector CT imaging of the chest was performed using the standard protocol during bolus administration of intravenous contrast. Multiplanar CT image reconstructions and MIPs were obtained to evaluate the vascular anatomy. CONTRAST:  16m OMNIPAQUE IOHEXOL 350 MG/ML SOLN COMPARISON:  Chest x-ray from earlier in the same day. FINDINGS: Cardiovascular: Thoracic aorta demonstrates atherosclerotic calcifications without aneurysmal dilatation. No significant enhancement is noted to rule out dissection. Heart is at the upper limits of normal in size. The pulmonary artery shows a normal branching pattern without definitive  filling defect to suggest pulmonary embolism. Mediastinum/Nodes: Thoracic inlet is within normal limits. No sizable hilar or mediastinal adenopathy is noted. The esophagus is within normal limits. Lungs/Pleura: Bibasilar atelectatic changes are noted right greater than left with associated right-sided pleural effusion. Somewhat lobulated nodular density is noted measuring 9 mm in the anterior aspect of the right upper lobe best seen on image number 46 of series 6. No other sizable nodules are seen. Upper Abdomen: Scattered prominent cysts are noted in the central portion of  the liver measuring up to 3.8 cm. Musculoskeletal: No chest wall abnormality. No acute or significant osseous findings. Review of the MIP images confirms the above findings. IMPRESSION: No evidence of pulmonary emboli. Bibasilar atelectasis with small right-sided pleural effusion. 9 mm lobulated nodular density in the right upper lobe. Consider one of the following in 3 months for both low-risk and high-risk individuals: (a) repeat chest CT, (b) follow-up PET-CT, or (c) tissue sampling. This recommendation follows the consensus statement: Guidelines for Management of Incidental Pulmonary Nodules Detected on CT Images: From the Fleischner Society 2017; Radiology 2017; 284:228-243. Aortic Atherosclerosis (ICD10-I70.0). Electronically Signed   By: Inez Catalina M.D.   On: 05/10/2020 12:32    Assessment/Plan  Hyponatremia  Na 132 05/12/20, Na 128 05/10/20, off HCTZ, repeat CMP/eGFR  Pleural effusion on right Angio chest 5//2022 Bibasilar atelectasis with small right-sided pleural effusion.  Nodule of upper lobe of right lung 9 mm lobulated nodular density in the right upper lobe. Consider one of the following in 3 months  Prepatellar effusion of right knee Prepatellar bursitis of the right knee, chronic. Takes prn Ibuprofen.   Morton's neuroma of third interspaces of both feet The R+L ball part feet pain when walking, not new, Hx of Metatarsalgia, Morton's neuroma  Essential hypertension, benign Metoprolol 12.51m bid, Bun/creat 12/0.91 05/12/20   Restless leg syndrome stable, on Gabapentin 3073mqam 90027mpm.  Essential thrombocythemia ([place] hydroxyurea, , plt 176 05/17/20   Symptomatic anemia Hgb 8.0 05/17/20  f/u oncology, on Erythropoietin   GERD (gastroesophageal reflux disease) stable, on Omeprazole.   Chronic constipation stable, on MiraLax qd, Colace qd.    URINARY INCONTINENCE, URGE, MILD stable, on Vesicare 44m70m.    Osteoarthritis, multiple  sites  radiculopathy R, Ortho 04/28/19,Ibuprofen, Gabapentin,Vit B6   Osteopenia takes Zometa.  MDS/MPN (myelodysplastic/myeloproliferative neoplasms) (HCC)[place], f/u oncology.    Labs/tests ordered:  CMP/eGFR  Next appt:  08/09/2020

## 2020-05-17 NOTE — Assessment & Plan Note (Signed)
The R+L ball part feet pain when walking, not new, Hx of Metatarsalgia, Morton's neuroma

## 2020-05-17 NOTE — Assessment & Plan Note (Signed)
stable, on Omeprazole.  

## 2020-05-17 NOTE — Assessment & Plan Note (Signed)
Na 132 05/12/20, Na 128 05/10/20, off HCTZ, repeat CMP/eGFR

## 2020-05-17 NOTE — Patient Instructions (Signed)

## 2020-05-17 NOTE — Assessment & Plan Note (Signed)
off hydroxyurea, , plt 176 05/17/20

## 2020-05-17 NOTE — Progress Notes (Signed)
Jill Myers   [phone] [phone]   Clinic Follow up Note   Patient Care Team: Mast, [name], NP as PCP - General (Internal Medicine) [name], MD as Consulting Physician (Dermatology) [name], MD as Consulting Physician ([place]) Jovita Kussmaul, MD as Consulting Physician (General Surgery) Raye Sorrow, MD as Consulting Physician (Pulmonary Disease) Truitt Merle, MD as Consulting Physician (Hematology) Gery Pray, MD as Consulting Physician (Radiation Oncology) Delice Bison, Charlestine Massed, NP as Nurse Practitioner (Hematology and Oncology) Annia Belt, MD as Consulting Physician (Oncology) Mauro Kaufmann, RN as Oncology Nurse Navigator Rockwell Germany, RN as Oncology Nurse Navigator  Date of Service:  05/17/2020  CHIEF COMPLAINT: f/u of MPN/MDS  SUMMARY OF ONCOLOGIC HISTORY: Oncology History Overview Note  Cancer Staging Malignant neoplasm of upper-inner quadrant of right breast in female, estrogen receptor positive ([place]) Staging form: Breast, AJCC 8th Edition - Clinical stage from 01/07/2017: Stage IA (cT1c, cN0, cM0, G3, ER: Positive, PR: Positive, HER2: Negative) - Signed by Truitt Merle, MD on 01/20/2017  Malignant neoplasm of upper-outer quadrant of left breast in female, estrogen receptor positive ([place]) Staging form: Breast, AJCC 8th Edition - Clinical stage from 01/07/2017: Stage IA (cT1b, cN0, cM0, G2, ER: Positive, PR: Positive, HER2: Negative) - Signed by Truitt Merle, MD on 01/20/2017     Malignant neoplasm of upper-inner quadrant of right breast in female, estrogen receptor positive ([place])  01/01/2017 Mammogram   IMPRESSION: 1. 2.0 cm mass in the 2 o'clock position of the right breast with imaging features highly suspicious for malignancy. 2. 0.7 cm mass in the 2 o'clock position of the left breast with imaging features highly suspicious for malignancy. 3. No abnormal appearing axillary lymph nodes on  either side.   01/07/2017 Receptors her2   Right Breast: Prognostic indicators significant for: ER, 95% positive and PR, 50% positive, both with strong staining intensity. Proliferation marker Ki67 at 30. HER2 negative. RATIO OF HER2/CEP17 SIGNALS 0.81 AVERAGE HER2 COPY NUMBER PER CELL 1.70  Left Breast: Prognostic indicators significant for: ER, 100% positive and PR, 100% positive, both with strong staining intensity. Proliferation marker Ki67 at 10. HER2 negative. RATIO OF HER2/CEP17 SIGNALS 1.75 AVERAGE HER2 COPY NUMBER PER CELL 3.15   01/07/2017 Initial Biopsy   Diagnosis 1. Breast, right, needle core biopsy, 2:00 o'clock - INVASIVE DUCTAL CARCINOMA, SEE COMMENT. 2. Breast, left, needle core biopsy, 2:00 o'clock - INVASIVE MAMMARY CARCINOMA, SEE COMMENT.    01/20/2017 Initial Diagnosis   Malignant neoplasm of upper-inner quadrant of right breast in female, estrogen receptor positive ([place])   01/2017 - 03/2018 Anti-estrogen oral therapy   Anastrozole 1 mg daily started on 01/2017, stopped around 01/2018 due to diffuse joint pain. She switched to Exemestane on 03/26/2018 but stopped after 1 week due to gluteal pain.    02/20/2017 Surgery   RIGHT BREAST LUMPECTOMY (Right) LEFT BREAST LUMPECTOMY WITH RADIOACTIVE SEED LOCALIZATION (Left)  Per Dr. Marlou Starks    02/20/2017 Pathology Results    Diagnosis 1. Breast, lumpectomy, Left w/ seed - INVASIVE DUCTAL CARCINOMA, GRADE I/III, SPANNING 1.1 CM. - THE SURGICAL RESECTION MARGINS ARE NEGATIVE FOR CARCINOMA. - SEE ONCOLOGY TABLE BELOW. 2. Breast, lumpectomy, Right - INVASIVE DUCTAL CARCINOMA, GRADE II/III, SPANNING 2.1 CM. - DUCTAL CARCINOMA IN SITU, INTERMEDIATE GRADE. - PERINEURAL INVASION IS IDENTIFIED. - THE SURGICAL RESECTION MARGINS ARE NEGATIVE FOR CARCINOMA. - SEE ONCOLOGY TABLE BELOW. 3. Breast, excision, Right inferior - LOBULAR NEOPLASIA (ATYPICAL LOBULAR HYPERPLASIA). - FAT NECROSIS. - SEE COMMENT.  Microscopic Comment 1.  BREAST, INVASIVE TUMOR (LEFT) Procedure: Seed localized lumpectomy Laterality: Left Tumor Size: 1.1 cm (gross measurement). Histologic Type: Ductal Grade: I Tubular Differentiation: 2 Nuclear Pleomorphism: 1 Mitotic Count: 1 Ductal Carcinoma in Situ (DCIS): Not identified Extent of Tumor: Confined to breast parenchyma. Margins: Greater than 0.2 cm to all margins. Regional Lymph Nodes: None examined. Breast Prognostic Profile: Case [id number] Estrogen Receptor: 100%, strong Progesterone Receptor: 100%, strong Her2: No amplification was detected. The ratio was 0.81 Ki-67: 10% Best tumor block for sendout testing: 1B Pathologic Stage Classification (pTNM, AJCC 8th Edition): Primary Tumor (pT): pT1c Regional Lymph Nodes (pN): pNX Distant Metastases (pM): pMX  2. BREAST, INVASIVE TUMOR (RIGHT) Procedure: Lumpectomy Laterality: Right Tumor Size: 2.1 cm (gross measurement) Histologic Type: Ductal Grade: II Tubular Differentiation: 3 Nuclear Pleomorphism: 2 Mitotic Count: 1 Ductal Carcinoma in Situ (DCIS): Present, intermediate grade Extent of Tumor: Confined to breast parenchyma. Margins: Greater than 0.2 cm to all margins Regional Lymph Nodes: None examined. Breast Prognostic Profile: Case [id number] Estrogen Receptor: 95%, strong Progesterone Receptor: 50%, strong Her2: No amplification was detected. The ratio was 0.81 Ki-67: 30% Best tumor block for sendout testing: 2A Pathologic Stage Classification (pTNM, AJCC 8th Edition): Primary Tumor (pT): pT2 Regional Lymph Nodes (pN): pNX Distant Metastases (pM): pMX 3. The surgical resection margin(s) of the specimen were inked and microscopically evaluated.   03/31/2017 - 05/11/2017 Radiation Therapy   1. Right Breast / 50 Gy in 25 fractions 2. Right Axilla / 45 Gy in 25 fractions 3. Right Breast Boost / 10 Gy in 5 fractions   Malignant neoplasm of upper-outer quadrant of left breast in female, estrogen receptor  positive ([place])  01/20/2017 Initial Diagnosis   Malignant neoplasm of upper-outer quadrant of left breast in female, estrogen receptor positive ([place])   02/20/2017 Surgery   RIGHT BREAST LUMPECTOMY (Right) LEFT BREAST LUMPECTOMY WITH RADIOACTIVE SEED LOCALIZATION (Left)  Per Dr. Marlou Starks     02/12/2018 Mammogram   IMPRESSION: No mammographic evidence of breast malignancy       CURRENT THERAPY:  Epogen weekly  INTERVAL HISTORY:  Amadi Yoshino is here for a follow up of MPN/MDS. She was last seen by me on 04/17/20 and in the hospital on 05/10/20. She presents to the clinic alone. She says she got a ride here. We connected with her son, Jerrye Beavers, by speaker phone. She reports reports continued chest pain since hospital discharge. She endorses using ibuprofen approximately every 4 hours, and this helps for the first few hours. She denies bleeding or bruising, but she does have a lump in her right inner elbow, possibly due to a needle placement during hospitalization. She also notes recent foot swelling, left worse than right.  She reports one instance of diarrhea and wonders if her diet is okay.   All other systems were reviewed with the patient and are negative.  MEDICAL HISTORY:  Past Medical History:  Diagnosis Date  . Bladder cystocele 05/21/2010  . Complication of anesthesia    deglutition in history  . Deglutition disorder 08/17/2012   Single episode of choking/aspiration; preceded by trouble swallowing saliva (thin liquids).  For SLP evaluation. Aug 17, 2012.    [name] Disease of blood and blood forming organ 01/05/2007   Qualifier: Diagnosis of  By: Oneida Alar MD, KARL    . Diverticulitis   . DIVERTICULOSIS OF COLON 03/05/2006   Qualifier: Diagnosis of  By: Samara Snide    . DIZZINESS 01/18/2010   Qualifier: Diagnosis of  By:  Lindell Noe MD, Jeneen Rinks    . Essential hypertension, benign 02/22/2008   Qualifier: Diagnosis of  By: Lindell Noe MD, Jeneen Rinks     . Essential thrombocythemia ([place]) 01/31/11  . Family  history of Alzheimer's disease 11/11/2013   Patient with family history of Alzheimers disease. She herself is very highly functional.  Plans for future visit with MMSE as primary function of the visit.    [name] GERD (gastroesophageal reflux disease) 08/16/2010  . HALLUX VALGUS, ACQUIRED 04/10/2009   Qualifier: Diagnosis of  By: [name] CMA,, [place]    . Hypertension   . HYPERTRIGLYCERIDEMIA 01/05/2007   Qualifier: Diagnosis of  By: Oneida Alar MD, KARL    . Insomnia 02/10/2014  . Left-sided chest wall pain 12/19/2011    L sided chest wall pain follows a dermatomal distribution and raises the possibility of thoracic radiculopathic pain, which would be expected to improve with gabapentin as well. We discussed this at length and she will call to make me aware if this does not get better. Rib belt/girdle in the meantime; may consider rib films or CXR if not improving.     . Metatarsalgia of right foot 11/28/2013   Referral to [facility]   . Osteopenia   . Personal history of radiation therapy    03/2017 right breast  . Restless leg syndrome 06/09/2011  . Restless legs syndrome (RLS)   . URINARY INCONTINENCE, URGE, MILD 11/27/2009   Qualifier: Diagnosis of  By: Zebedee Iba NP, Manuela Schwartz    . Vitamin D deficiency 08/16/2010    SURGICAL HISTORY: Past Surgical History:  Procedure Laterality Date  . BIOPSY BREAST Left 1998   Dr. Rebekah Chesterfield  . BREAST EXCISIONAL BIOPSY    . BREAST LUMPECTOMY Right 02/20/2017   Procedure: RIGHT BREAST LUMPECTOMY;  Surgeon: Jovita Kussmaul, MD;  Location: [place];  Service: General;  Laterality: Right;  . BREAST LUMPECTOMY Left 02/20/2017   left lumpectomy  . BREAST LUMPECTOMY WITH RADIOACTIVE SEED LOCALIZATION Left 02/20/2017   Procedure: LEFT BREAST LUMPECTOMY WITH RADIOACTIVE SEED LOCALIZATION;  Surgeon: Jovita Kussmaul, MD;  Location: [place];  Service: General;  Laterality: Left;  . CATARACT EXTRACTION  2010   Dr. [name]  . CHOLECYSTECTOMY  1992  . COLONOSCOPY   2008  . MOHS SURGERY  2012   on face [facility] [place]  . NAILBED REPAIR  12/05/2010   Procedure: NAILBED REPAIR;  Surgeon: Cammie Sickle., MD;  Location: [place];  Service: Orthopedics;  Laterality: Right;  full thickness nail biopsy right hallux  . torn tenden  2012   hand Dr. Orie Rout    I have reviewed the social history and family history with the patient and they are unchanged from previous note.  ALLERGIES:  is allergic to doxycycline, macrodantin [nitrofurantoin], and sulfamethoxazole-trimethoprim.  MEDICATIONS:  Current Outpatient Medications  Medication Sig Dispense Refill  . acetaminophen-codeine (TYLENOL #3) 300-30 MG tablet Take 1 tablet by mouth every 4 (four) hours as needed for moderate pain. 20 tablet 0  . aspirin 81 MG tablet Take 81 mg by mouth daily.    . calcium carbonate (OSCAL) 1500 (600 Ca) MG TABS tablet Take 600 mg of elemental calcium by mouth in the morning and at bedtime.     . cholecalciferol (VITAMIN D) 1000 UNITS tablet Take 2,000 Units by mouth daily.     . feeding supplement (ENSURE ENLIVE / ENSURE PLUS) LIQD Take 237 mLs by mouth 2 (two) times daily between meals. 14220 mL 0  .  gabapentin (NEURONTIN) 300 MG capsule TAKE 1 CAPSULE BY MOUTH EVERY MORNING AND TAKE 3 CAPSULES BY MOUTH EVERY EVENING (Patient taking differently: Take 300-900 mg by mouth See admin instructions. TAKE 320m BY MOUTH EVERY MORNING AND TAKE 9012mBY MOUTH EVERY EVENING) 360 capsule 1  . GLUCOSAMINE-CHONDROITIN PO Take 2 tablets by mouth daily. 1500-1200    . ibuprofen (ADVIL,MOTRIN) 200 MG tablet Take 400 mg by mouth as needed for fever, headache or mild pain.    . Magnesium 500 MG TABS Take 1 tablet by mouth daily.    . metoprolol tartrate (LOPRESSOR) 25 MG tablet TAKE 1/2 TABLET BY MOUTH TWICE A DAY (Patient taking differently: Take 12.5 mg by mouth 2 (two) times daily.) 90 tablet 1  . Multiple Vitamin (MULTIVITAMIN WITH MINERALS) TABS tablet  Take 1 tablet by mouth daily. 30 tablet 0  . Omega-3 Fatty Acids (FISH OIL PO) Take 1 capsule by mouth daily.    . Marland Kitchenmeprazole (PRILOSEC) 20 MG capsule TAKE 1 CAPSULE BY MOUTH EVERY DAY (Patient taking differently: Take 20 mg by mouth daily.) 90 capsule 1  . polyethylene glycol (MIRALAX / GLYCOLAX) packet Take 17 g by mouth daily.    . Marland KitchenyridOXINE (VITAMIN B-6) 100 MG tablet Take 100 mg by mouth daily.    . solifenacin (VESICARE) 10 MG tablet TAKE 1 TABLET BY MOUTH EVERY DAY (Patient taking differently: Take 5 mg by mouth daily.) 90 tablet 1   No current facility-administered medications for this visit.    PHYSICAL EXAMINATION: ECOG PERFORMANCE STATUS: 2 - Symptomatic, <50% confined to bed  Vitals:   05/17/20 1105  BP: 134/65  Pulse: 83  Resp: 18  Temp: 98.2 F (36.8 C)  SpO2: 95%   Filed Weights   05/17/20 1105  Weight: 117 lb 11.2 oz (53.4 kg)    GENERAL:alert, no distress and comfortable SKIN: skin color, texture, turgor are normal, no rashes or significant lesions EYES: normal, Conjunctiva are pink and non-injected, sclera clear  LUNGS: clear to auscultation and percussion with normal breathing effort HEART: regular rate & rhythm and no murmurs and no lower extremity edema Musculoskeletal:no cyanosis of digits and no clubbing  NEURO: alert & oriented x 3 with fluent speech, no focal motor/sensory deficits  LABORATORY DATA:  I have reviewed the data as listed CBC Latest Ref Rng & Units 05/17/2020 05/11/2020 05/10/2020  WBC 4.0 - 10.5 K/uL 26.3(H) 13.1(H) 16.4(H)  Hemoglobin 12.0 - 15.0 g/dL 8.0(L) 8.2(L) 8.6(L)  Hematocrit 36.0 - 46.0 % 24.0(L) 25.4(L) 26.5(L)  Platelets 150 - 400 K/uL 176 153 156     CMP Latest Ref Rng & Units 05/12/2020 05/11/2020 05/10/2020  Glucose 70 - 99 mg/dL 130(H) 93 101(H)  BUN 8 - 23 mg/dL _0 Creatinine 0.44 - 1.00 mg/dL 0.91 0.63 0.70  Sodium 135 - 145 mmol/L 132(L) 129(L) 128(L)  Potassium 3.5 - 5.1 mmol/L 3.9 3.0(L) 3.5  Chloride 98 - 111  mmol/L 98 92(L) 94(L)  CO2 22 - 32 mmol/L _1 Calcium 8.9 - 10.3 mg/dL 8.7(L) 8.3(L) 8.4(L)  Total Protein 6.5 - 8.1 g/dL - - 6.2(L)  Total Bilirubin 0.3 - 1.2 mg/dL - - 0.5  Alkaline Phos 38 - 126 U/L - - 77  AST 15 - 41 U/L - - 31  ALT 0 - 44 U/L - - 30      RADIOGRAPHIC STUDIES: I have personally reviewed the radiological images as listed and agreed with the findings in the report. No results  found.   ASSESSMENT & PLAN:  Jill Myers is a 87 y.o. female with   1.  Essential thrombocythemia, new anemia, evolving to MPN/MDS, with complex genetic changes, high risk MDS   -Diagnosed in 2005, has been on Hydrea since then on 07 March 2020.  Stopped due to moderate anemia -Patient developed moderate symptomatic anemia required blood transfusion in March 2022 -Bone marrow biopsy showed 5 to 10% blasts, with left shift of marrow cells.  Bone marrow is significantly hypercellular (>90%).  This is concerning for MPN evolving to preleukemia, such as MDS. Cytogenetics and FISH results showed multiple complex genetic abnormalities, predicts high risk MDS and poor prognosis (median survival less than 1 year). -I discussed she may develop acute leukemia down the road.  Her moderate anemia is related to the disease progression of her ET to MDS/MPN -Bone marrow patient panel showed multiple genomic abnormalities, including 5 q. deletion, trisomy 8, monosomy 7, trisomy 11 q., trisomy 11 q. This predicts high risk MDS, and likely rapidly evolving to AML in near future. -I have discussed the treatment options with patient, including chemo with hypomethylation agents, such as Vidaza injections, or supportive care with blood transfusion and EPO injection.  Patient declined chemotherapy -she started EPO last week  -Labs reviewed, Hgb 8 today (05/17/20), no need for blood transfusion. Her Hg has not improved since last week, will increase Retacrit dose to 20,000 units weekly   2. Chest  Pain -Secondary to right-sided pleuritis  -She reports continued pain, especially with deep breaths, today -She currently takes ibuprofen every 4 hours, does not control pain -I will prescribe tramadol and codeine for her.  3. Social Support -She was told not to drive. Her son notes this may have been during her severe anemia. -I discussed palliative care and hospice with her today. I recommend home care for her to allow her to continue being independent. -She expressed concern with her diet today. I will refer her to a dietician. -She is scheduled to see her PCP later today.  4. Newly diagnosed left breast DCIS, high grade , ER-/PR- -surgery is on hold due to her recent anemia and MDS diagnosis   5.Bilateral breast cancer, invasive ductal carcinoma, right upper inner quadrant, pT2N0M0, stage IA, ER+/PR+/HER2-, G3, left upper-outer quadrant,pT1cN0M0, stage IA, ER+/PR+/HER2-, G2 --She was diagnosed on 01/20/2017, and is s/pbilateral lumpectomy with negative margins,followed by adjuvant right breastand axillaryradiation. -Shetried anastrozole and Exemestane from 01/2017-01/2018 but stopped due to MSK pain. -Continue cancer surveillance   Plan -Proceed to Retacrit today at increased dose 20KU and continue weekly -lab and injection weekly, consider blood transfusion if hemoglobin less than 8.0, or symptomatic anemia with hemoglobin less than 8.5 -Lab and follow-up in 3 weeks -palliative care referral -I spoke with her son on the phone during her visit today     No problem-specific Assessment & Plan notes found for this encounter.   Orders Placed This Encounter  Procedures  . Ambulatory Referral to [facility]    Referral Priority:   Routine    Referral Type:   Consultation    Referral Reason:   Specialty Services Required    Number of Visits Requested:   1   All questions were answered. The patient knows to call the clinic with any problems, questions or concerns. No  barriers to learning was detected. The total time spent in the appointment was 30 minutes.     Truitt Merle, MD 05/17/2020   I, Wilburn Mylar, am acting  as scribe for Truitt Merle, MD.   I have reviewed the above documentation for accuracy and completeness, and I agree with the above.

## 2020-05-17 NOTE — Assessment & Plan Note (Signed)
Metoprolol 12.5mg  bid, Bun/creat 12/0.91 05/12/20

## 2020-05-17 NOTE — Assessment & Plan Note (Signed)
stable, on Vesicare 10mg qd.   

## 2020-05-17 NOTE — Assessment & Plan Note (Signed)
Wbc 26 today at cancer center, f/u oncology.

## 2020-05-17 NOTE — Assessment & Plan Note (Signed)
radiculopathy R, Ortho 04/28/19,Ibuprofen, Gabapentin,Vit B6  

## 2020-05-17 NOTE — Assessment & Plan Note (Signed)
Prepatellar bursitis of the right knee, chronic. Takes prn Ibuprofen.  

## 2020-05-17 NOTE — Assessment & Plan Note (Signed)
Hgb 8.0 05/17/20  f/u oncology, on Erythropoietin 

## 2020-05-18 ENCOUNTER — Telehealth: Payer: Self-pay | Admitting: Hematology

## 2020-05-18 ENCOUNTER — Other Ambulatory Visit: Payer: Self-pay

## 2020-05-18 ENCOUNTER — Other Ambulatory Visit: Payer: Self-pay | Admitting: *Deleted

## 2020-05-18 DIAGNOSIS — G2581 Restless legs syndrome: Secondary | ICD-10-CM

## 2020-05-18 DIAGNOSIS — I10 Essential (primary) hypertension: Secondary | ICD-10-CM

## 2020-05-18 DIAGNOSIS — K219 Gastro-esophageal reflux disease without esophagitis: Secondary | ICD-10-CM

## 2020-05-18 DIAGNOSIS — D469 Myelodysplastic syndrome, unspecified: Secondary | ICD-10-CM

## 2020-05-18 MED ORDER — OMEPRAZOLE 20 MG PO CPDR: DELAYED_RELEASE_CAPSULE | ORAL | 1 refills | Status: AC

## 2020-05-18 MED ORDER — GABAPENTIN 300 MG PO CAPS: ORAL_CAPSULE | ORAL | 1 refills | Status: AC

## 2020-05-18 MED ORDER — METOPROLOL TARTRATE 25 MG PO TABS: 12.5000 mg | ORAL_TABLET | Freq: Two times a day (BID) | ORAL | 1 refills | Status: AC

## 2020-05-18 MED ORDER — SOLIFENACIN SUCCINATE 10 MG PO TABS: 10.0000 mg | ORAL_TABLET | Freq: Every day | ORAL | 1 refills | Status: AC

## 2020-05-18 NOTE — Telephone Encounter (Signed)
Patient is switching Pharmacies and requested refills to be sent to [facility].

## 2020-05-18 NOTE — Telephone Encounter (Signed)
Scheduled follow-up appointments per 5/12 los. Patient is aware. 

## 2020-05-22 ENCOUNTER — Telehealth: Payer: Self-pay | Admitting: Student

## 2020-05-22 NOTE — Telephone Encounter (Signed)
Palliative NP scheduled new consult on 05/24/20 at 1230pm.

## 2020-05-23 ENCOUNTER — Encounter: Payer: Self-pay | Admitting: Hematology

## 2020-05-24 ENCOUNTER — Other Ambulatory Visit: Payer: Self-pay

## 2020-05-24 ENCOUNTER — Other Ambulatory Visit: Payer: Medicare PPO | Admitting: Student

## 2020-05-24 ENCOUNTER — Inpatient Hospital Stay: Payer: Medicare PPO

## 2020-05-24 VITALS — BP 133/70 | HR 75 | Resp 16

## 2020-05-24 DIAGNOSIS — Z515 Encounter for palliative care: Secondary | ICD-10-CM

## 2020-05-24 DIAGNOSIS — Z79899 Other long term (current) drug therapy: Secondary | ICD-10-CM | POA: Diagnosis not present

## 2020-05-24 DIAGNOSIS — D0512 Intraductal carcinoma in situ of left breast: Secondary | ICD-10-CM | POA: Diagnosis not present

## 2020-05-24 DIAGNOSIS — R630 Anorexia: Secondary | ICD-10-CM | POA: Diagnosis not present

## 2020-05-24 DIAGNOSIS — Z853 Personal history of malignant neoplasm of breast: Secondary | ICD-10-CM | POA: Diagnosis not present

## 2020-05-24 DIAGNOSIS — I10 Essential (primary) hypertension: Secondary | ICD-10-CM | POA: Diagnosis not present

## 2020-05-24 DIAGNOSIS — Z7982 Long term (current) use of aspirin: Secondary | ICD-10-CM | POA: Diagnosis not present

## 2020-05-24 DIAGNOSIS — D469 Myelodysplastic syndrome, unspecified: Secondary | ICD-10-CM

## 2020-05-24 DIAGNOSIS — Z170 Estrogen receptor positive status [ER+]: Secondary | ICD-10-CM

## 2020-05-24 DIAGNOSIS — M85851 Other specified disorders of bone density and structure, right thigh: Secondary | ICD-10-CM

## 2020-05-24 DIAGNOSIS — D649 Anemia, unspecified: Secondary | ICD-10-CM

## 2020-05-24 LAB — CBC WITH DIFFERENTIAL (CANCER CENTER ONLY)
Abs Immature Granulocytes: 12.1 10*3/uL — ABNORMAL HIGH (ref 0.00–0.07)
Band Neutrophils: 20 %
Basophils Absolute: 0 10*3/uL (ref 0.0–0.1)
Basophils Relative: 0 %
Blasts: 4 %
Eosinophils Absolute: 0.3 10*3/uL (ref 0.0–0.5)
Eosinophils Relative: 1 %
HCT: 23.3 % — ABNORMAL LOW (ref 36.0–46.0)
Hemoglobin: 7.6 g/dL — ABNORMAL LOW (ref 12.0–15.0)
Lymphocytes Relative: 11 %
Lymphs Abs: 3.1 10*3/uL (ref 0.7–4.0)
MCH: 35.2 pg — ABNORMAL HIGH (ref 26.0–34.0)
MCHC: 32.6 g/dL (ref 30.0–36.0)
MCV: 107.9 fL — ABNORMAL HIGH (ref 80.0–100.0)
Metamyelocytes Relative: 23 %
Monocytes Absolute: 2 10*3/uL — ABNORMAL HIGH (ref 0.1–1.0)
Monocytes Relative: 7 %
Myelocytes: 20 %
Neutro Abs: 9.6 10*3/uL — ABNORMAL HIGH (ref 1.7–7.7)
Neutrophils Relative %: 14 %
Platelet Count: 150 10*3/uL (ref 150–400)
RBC: 2.16 MIL/uL — ABNORMAL LOW (ref 3.87–5.11)
RDW: 22.3 % — ABNORMAL HIGH (ref 11.5–15.5)
WBC Count: 28.2 10*3/uL — ABNORMAL HIGH (ref 4.0–10.5)
nRBC: 1.1 % — ABNORMAL HIGH (ref 0.0–0.2)

## 2020-05-24 LAB — SAMPLE TO BLOOD BANK

## 2020-05-24 MED ORDER — ALTEPLASE 2 MG IJ SOLR
2.0000 mg | Freq: Once | INTRAMUSCULAR | Status: DC | PRN
  Filled 2020-05-24: qty 2

## 2020-05-24 MED ORDER — EPOETIN ALFA-EPBX 10000 UNIT/ML IJ SOLN
INTRAMUSCULAR | Status: AC
  Filled 2020-05-24: qty 2

## 2020-05-24 MED ORDER — EPOETIN ALFA-EPBX 10000 UNIT/ML IJ SOLN
20000.0000 [IU] | Freq: Once | INTRAMUSCULAR | Status: AC
  Administered 2020-05-24: 20000 [IU] via SUBCUTANEOUS

## 2020-05-24 MED ORDER — HEPARIN SOD (PORK) LOCK FLUSH 100 UNIT/ML IV SOLN
250.0000 [IU] | Freq: Once | INTRAVENOUS | Status: DC | PRN
  Filled 2020-05-24: qty 5

## 2020-05-24 MED ORDER — HEPARIN SOD (PORK) LOCK FLUSH 100 UNIT/ML IV SOLN
500.0000 [IU] | Freq: Once | INTRAVENOUS | Status: DC | PRN
  Filled 2020-05-24: qty 5

## 2020-05-24 MED ORDER — SODIUM CHLORIDE 0.9% FLUSH
10.0000 mL | Freq: Once | INTRAVENOUS | Status: DC | PRN
  Filled 2020-05-24: qty 10

## 2020-05-24 MED ORDER — EPOETIN ALFA-EPBX 40000 UNIT/ML IJ SOLN: 20000.0000 [IU] | Freq: Once | INTRAMUSCULAR | Status: DC

## 2020-05-24 MED ORDER — SODIUM CHLORIDE 0.9% FLUSH
3.0000 mL | Freq: Once | INTRAVENOUS | Status: DC | PRN
  Filled 2020-05-24: qty 10

## 2020-05-24 NOTE — Progress Notes (Signed)
[facility] Palliative Care Consult Note Telephone: [phone]  Fax: [phone]    Date of encounter: 05/24/20 PATIENT NAME: Jill Myers [place] [address]   [phone] (home)  DOB: 1930-08-18 MRN: [id number] PRIMARY CARE PROVIDER:    Mast, [name], NP,  [address] [address] [phone]  REFERRING PROVIDER:   Mast, [name], NP [address],  [place] [address] [phone]  RESPONSIBLE PARTY:    Contact Information    Name Relation Home Work [place] Son [phone]  [phone]       I met face to face with patient in the home. Palliative Care was asked to follow this patient by consultation request of  Mast, [name], NP to address advance care planning and complex medical decision making. This is the initial visit.                                     ASSESSMENT AND PLAN / RECOMMENDATIONS:   Advance Care Planning/Goals of Care: Goals include to maximize quality of life and symptom management. To maintain independence. Our advance care planning conversation included a discussion about:     The value and importance of advance care planning   Experiences with loved ones who have been seriously ill or have died   Exploration of personal, cultural or spiritual beliefs that might influence medical decisions   Exploration of goals of care in the event of a sudden injury or illness   MOST form reviewed.  CODE STATUS: DNR  Symptom Management/Plan:  MDS/MPN-patient currently receiving Retacrit injections weekly. Routine lab work. Follow up with Oncology as scheduled. Continue blood transfusions as needed.  Decline in appetite-patient encouraged to eat foods she enjoys, nutritional supplements recommended. Dietician referral.  Referral made to palliative SW to assist with long range planning, assistance with long term care policy. Recommendations on in home care to  aid in maintaining her independence.    Follow up Palliative Care Visit: Palliative care will continue to follow for complex medical decision making, advance care planning, and clarification of goals. Return in 4 weeks or prn.  I spent 60 minutes providing this consultation. More than 50% of the time in this consultation was spent in counseling and care coordination.   PPS: 70%  HOSPICE ELIGIBILITY/DIAGNOSIS: TBD  Chief Complaint: Palliative Medicine initial consult.   HISTORY OF PRESENT ILLNESS:  Jill Myers is a 87 y.o. 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 Patient recently hospitalized 5/5-05/12/20. Patient has declined chemotherapy; she is open to blood transfusions as needed, weekly EPO injections.  Patient resides at [facility], independent living apartment. She reports feeling tired. She denies having pain. She is still able to drive; she does have support system available to take her to and from appointments if needed. She is able to complete her adl's. She does She is requesting more help/assistance with household chores/tasks. She does eat in dining room for one good meal daily; reports appetite "not very good." Has tried ensure; doesn't like chocolate. Eats a good breakfast. Encouraged snacking throughout the day. She is sleeping well at night. She has a long term policy and would like some assistance with this. Her daughter will be visiting from [place];  arriving today. Son lives in [place].    History obtained from review of EMR, discussion with primary team, and interview with family, facility staff/caregiver and/or Ms. Simeon Craft.  I reviewed available labs, medications, imaging, studies and related documents from the EMR.  Records reviewed and summarized above.   ROS  General: NAD EYES: denies vision changes ENMT: denies  dysphagia Cardiovascular: denies chest pain Pulmonary: denies cough, SOB with exertion Abdomen: endorses decline in appetite, denies constipation, endorses continence of bowel GU: denies dysuria, endorses continence of urine MSK: weakness, no falls reported Skin: denies rashes or wounds Neurological: denies insomnia Psych: Endorses stable mood Heme/lymph/immuno: denies bruises, abnormal bleeding  Physical Exam: Pulse 80, resp 20, b/p 120/60, sats 96% on room air Constitutional: NAD General: frail appearing, thin  EYES: anicteric sclera, lids intact, no discharge  ENMT: intact hearing, oral mucous membranes moist, dentition intact CV: S1S2, RRR, no LE edema Pulmonary: LCTA, no increased work of breathing, no cough Abdomen: normo-active BS + 4 quadrants, soft and non tender GU: deferred MSK: moves all extremities, ambulatory Skin: warm and dry, no rashes or wounds on visible skin Neuro: generalized weakness,  no cognitive impairment Psych: non-anxious affect, A & O x 3 Hem/lymph/immuno: no widespread bruising   CURRENT PROBLEM LIST:  Patient Active Problem List   Diagnosis Date Noted  . Pleural effusion on right 05/17/2020  . Nodule of upper lobe of right lung 05/17/2020  . Hypokalemia 05/11/2020  . Hypoxia 05/10/2020  . Symptomatic anemia 05/09/2020  . MDS/MPN (myelodysplastic/myeloproliferative neoplasms) ([place]) 05/09/2020  . Hyponatremia 02/02/2020  . Prepatellar effusion of right knee 02/02/2020  . Fall 05/24/2019  . Pain of back and right lower extremity 04/14/2019  . Osteoarthritis, multiple sites 12/24/2017  . Malignant neoplasm of upper-inner quadrant of right breast in female, estrogen receptor positive ([place]) 01/20/2017  . Malignant neoplasm of upper-outer quadrant of left breast in female, estrogen receptor positive ([place]) 01/20/2017  . Chronic constipation 06/19/2016  . Morton's neuroma of third interspaces of both feet 04/16/2015  . Osteopenia 10/06/2014  .  Hypertension   . Essential thrombocythemia ([place]) 05/08/2014  . Cystitis 02/20/2014  . Insomnia 02/10/2014  . Metatarsalgia of both feet 11/28/2013  . Family history of Alzheimer's disease 11/11/2013  . Deglutition disorder 08/17/2012  . Left-sided chest wall pain 12/19/2011  . Restless leg syndrome 06/09/2011  . GERD (gastroesophageal reflux disease) 08/16/2010  . Vitamin D deficiency 08/16/2010  . Advanced directives, counseling/discussion 08/16/2010  . Dysuria 05/21/2010  . Cystocele, unspecified (CODE) 05/21/2010  . DIZZINESS 01/18/2010  . URINARY INCONTINENCE, URGE, MILD 11/27/2009  . Hallux valgus, acquired 04/10/2009  . Essential hypertension, benign 02/22/2008  . HYPERTRIGLYCERIDEMIA 01/05/2007  . Disease of blood and blood forming organ 01/05/2007  . DIVERTICULOSIS OF COLON 03/05/2006   PAST MEDICAL HISTORY:  Active Ambulatory Problems    Diagnosis Date Noted  . HYPERTRIGLYCERIDEMIA 01/05/2007  . Disease of blood and blood forming organ 01/05/2007  . Essential hypertension, benign 02/22/2008  . DIVERTICULOSIS OF COLON 03/05/2006  . Hallux valgus, acquired 04/10/2009  . URINARY INCONTINENCE, URGE, MILD 11/27/2009  . DIZZINESS 01/18/2010  . Dysuria 05/21/2010  . Cystocele, unspecified (CODE) 05/21/2010  . GERD (gastroesophageal reflux disease) 08/16/2010  . Vitamin D deficiency 08/16/2010  . Advanced directives, counseling/discussion 08/16/2010  . Restless leg syndrome 06/09/2011  . Left-sided chest wall pain 12/19/2011  . Deglutition disorder 08/17/2012  . Family history of Alzheimer's disease 11/11/2013  . Metatarsalgia of both feet 11/28/2013  . Insomnia 02/10/2014  .  Cystitis 02/20/2014  . Essential thrombocythemia ([place]) 05/08/2014  . Hypertension   . Osteopenia 10/06/2014  . Morton's neuroma of third interspaces of both feet 04/16/2015  . Chronic constipation 06/19/2016  . Malignant neoplasm of upper-inner quadrant of right breast in female, estrogen  receptor positive ([place]) 01/20/2017  . Malignant neoplasm of upper-outer quadrant of left breast in female, estrogen receptor positive ([place]) 01/20/2017  . Osteoarthritis, multiple sites 12/24/2017  . Pain of back and right lower extremity 04/14/2019  . Fall 05/24/2019  . Hyponatremia 02/02/2020  . Prepatellar effusion of right knee 02/02/2020  . Symptomatic anemia 05/09/2020  . MDS/MPN (myelodysplastic/myeloproliferative neoplasms) ([place]) 05/09/2020  . Hypoxia 05/10/2020  . Hypokalemia 05/11/2020  . Pleural effusion on right 05/17/2020  . Nodule of upper lobe of right lung 05/17/2020   Resolved Ambulatory Problems    Diagnosis Date Noted  . UTERINE FIBROID 03/05/2006  . PATELLO FEMORAL STRESS SYNDROME 03/05/2006  . Benign paroxysmal positional vertigo 01/18/2010  . Vaginal irritation 05/21/2010   Past Medical History:  Diagnosis Date  . Bladder cystocele 05/21/2010  . Complication of anesthesia   . Diverticulitis   . HALLUX VALGUS, ACQUIRED 04/10/2009  . Metatarsalgia of right foot 11/28/2013  . Personal history of radiation therapy   . Restless legs syndrome (RLS)    SOCIAL HX:  Social History   Tobacco Use  . Smoking status: Former Smoker    Years: 3.00    Quit date: 1958    Years since quitting: 64.4  . Smokeless tobacco: Never Used  Substance Use Topics  . Alcohol use: Yes    Alcohol/week: 0.0 standard drinks    Comment: Wine rarely.   FAMILY HX:  Family History  Problem Relation Age of Onset  . Heart disease Mother   . Tuberculosis Father   . Heart disease Sister   . Heart disease Brother   . Alzheimer's disease Brother   . Alzheimer's disease Brother       ALLERGIES:  Allergies  Allergen Reactions  . Doxycycline Nausea And Vomiting  . Macrodantin [Nitrofurantoin] Nausea And Vomiting  . Sulfamethoxazole-Trimethoprim Rash    REACTION: Rash after completing course of Septra     PERTINENT MEDICATIONS:  Outpatient Encounter Medications as of 05/24/2020   Medication Sig  . acetaminophen-codeine (TYLENOL #3) 300-30 MG tablet Take 1 tablet by mouth every 4 (four) hours as needed for moderate pain.  [name] aspirin 81 MG tablet Take 81 mg by mouth daily.  . calcium carbonate (OSCAL) 1500 (600 Ca) MG TABS tablet Take 600 mg of elemental calcium by mouth in the morning and at bedtime.   . cholecalciferol (VITAMIN D) 1000 UNITS tablet Take 2,000 Units by mouth daily.   . feeding supplement (ENSURE ENLIVE / ENSURE PLUS) LIQD Take 237 mLs by mouth 2 (two) times daily between meals.  . gabapentin (NEURONTIN) 300 MG capsule Take One Capsule ($RemoveBefo'300mg'[id number]$ ) by mouth every morning and Take Three capsules ($RemoveBeforeD'900mg'[id number]$ ) by mouth every evening.  [name] GLUCOSAMINE-CHONDROITIN PO Take 2 tablets by mouth daily. 1500-1200  . ibuprofen (ADVIL,MOTRIN) 200 MG tablet Take 400 mg by mouth as needed for fever, headache or mild pain.  . Magnesium 500 MG TABS Take 1 tablet by mouth daily.  . metoprolol tartrate (LOPRESSOR) 25 MG tablet Take 0.5 tablets (12.5 mg total) by mouth 2 (two) times daily.  . Multiple Vitamin (MULTIVITAMIN WITH MINERALS) TABS tablet Take 1 tablet by mouth daily.  . Omega-3 Fatty Acids (FISH OIL PO) Take 1 capsule by mouth daily.  [name]  omeprazole (PRILOSEC) 20 MG capsule Take one capsule by mouth once daily.  . polyethylene glycol (MIRALAX / GLYCOLAX) packet Take 17 g by mouth daily.  [name] pyridOXINE (VITAMIN B-6) 100 MG tablet Take 100 mg by mouth daily.  . solifenacin (VESICARE) 10 MG tablet Take 1 tablet (10 mg total) by mouth daily.   Facility-Administered Encounter Medications as of 05/24/2020  Medication  . alteplase (CATHFLO ACTIVASE) injection 2 mg  . heparin lock flush 100 unit/mL  . heparin lock flush 100 unit/mL  . sodium chloride flush (NS) 0.9 % injection 10 mL  . sodium chloride flush (NS) 0.9 % injection 3 mL  . [DISCONTINUED] epoetin alfa-epbx (RETACRIT) injection 20,000 Units    Thank you for the opportunity to participate in the care of Ms. Simeon Craft.  The  palliative care team will continue to follow. Please call our office at [phone] if we can be of additional assistance.   Ezekiel Slocumb, NP   COVID-19 PATIENT SCREENING TOOL Asked and negative response unless otherwise noted:   Have you had symptoms of covid, tested positive or been in contact with someone with symptoms/positive test in the past 5-10 days? No

## 2020-05-24 NOTE — Patient Instructions (Signed)

## 2020-05-25 ENCOUNTER — Other Ambulatory Visit: Payer: Self-pay

## 2020-05-25 ENCOUNTER — Encounter: Payer: Self-pay | Admitting: Hematology

## 2020-05-25 ENCOUNTER — Inpatient Hospital Stay: Payer: Medicare PPO

## 2020-05-25 DIAGNOSIS — Z79899 Other long term (current) drug therapy: Secondary | ICD-10-CM | POA: Diagnosis not present

## 2020-05-25 DIAGNOSIS — Z853 Personal history of malignant neoplasm of breast: Secondary | ICD-10-CM | POA: Diagnosis not present

## 2020-05-25 DIAGNOSIS — Z7982 Long term (current) use of aspirin: Secondary | ICD-10-CM | POA: Diagnosis not present

## 2020-05-25 DIAGNOSIS — I10 Essential (primary) hypertension: Secondary | ICD-10-CM | POA: Diagnosis not present

## 2020-05-25 DIAGNOSIS — D469 Myelodysplastic syndrome, unspecified: Secondary | ICD-10-CM

## 2020-05-25 DIAGNOSIS — D649 Anemia, unspecified: Secondary | ICD-10-CM

## 2020-05-25 DIAGNOSIS — D0512 Intraductal carcinoma in situ of left breast: Secondary | ICD-10-CM | POA: Diagnosis not present

## 2020-05-25 LAB — PREPARE RBC (CROSSMATCH)

## 2020-05-25 MED ORDER — SODIUM CHLORIDE 0.9% IV SOLUTION
250.0000 mL | Freq: Once | INTRAVENOUS | Status: AC
  Administered 2020-05-25: 250 mL via INTRAVENOUS
  Filled 2020-05-25: qty 250

## 2020-05-25 NOTE — Patient Instructions (Signed)

## 2020-05-28 LAB — TYPE AND SCREEN
ABO/RH(D): B POS
Antibody Screen: NEGATIVE
Unit division: 0

## 2020-05-28 LAB — BPAM RBC
Blood Product Expiration Date: 202206142359
ISSUE DATE / TIME: 202205201348
Unit Type and Rh: 7300

## 2020-05-30 ENCOUNTER — Ambulatory Visit: Payer: Medicare PPO | Admitting: Adult Health

## 2020-05-30 ENCOUNTER — Telehealth: Payer: Self-pay

## 2020-05-30 ENCOUNTER — Encounter: Payer: Self-pay | Admitting: Adult Health

## 2020-05-30 ENCOUNTER — Other Ambulatory Visit: Payer: Self-pay

## 2020-05-30 VITALS — BP 108/60 | HR 77 | Temp 97.7°F | Resp 16 | Ht 63.0 in | Wt 122.2 lb

## 2020-05-30 DIAGNOSIS — R600 Localized edema: Secondary | ICD-10-CM | POA: Diagnosis not present

## 2020-05-30 DIAGNOSIS — G629 Polyneuropathy, unspecified: Secondary | ICD-10-CM | POA: Diagnosis not present

## 2020-05-30 MED ORDER — GABAPENTIN 300 MG PO CAPS: 300.0000 mg | ORAL_CAPSULE | Freq: Every day | ORAL | 0 refills | Status: AC

## 2020-05-30 NOTE — Progress Notes (Signed)
[facility] clinic  Provider:   Durenda Age   - DNP  Code Status:   DNR   Goals of Care:  Advanced Directives 05/30/2020  Does Patient Have a Medical Advance Directive? Yes  Type of Paramedic of [place];Living will  Does patient want to make changes to medical advance directive? No - Patient declined  Copy of [place] in Chart? Yes - validated most recent copy scanned in chart (See row information)  Would patient like information on creating a medical advance directive? -     Chief Complaint  Patient presents with  . Acute Visit    Complains of right side pain and edema in feet.    HPI: Patient is a 87 y.o. female seen today for an acute visit for right-sided pain and edema. She came to the clinic with daughter who is visiting from [place]. She has trace edema on her right foot, which started a month ago. She said that it hurts, 9/10, just lifting her right foot. She took Acetaminophen with codeine this morning which eased up her pain. She is currently taking Neurontin 300 mg in the morning and 900 mg at bedtime for restless leg syndrome. She had [place] COVID-19 booster on 05/28/20 and was injected on her left arm. She is currently living at [facility] independent living facility. Daughter was concerned about patient needing more assistance now but patient does not want to give up her independent apartment for now.  Of note, she was hospitalized 05/10/20 to 05/12/20 for right-sided pleuritic chest pain related to small pleural effusion, complicated with acute hypoxemic respiratory failure. She was given oxygen which was weaned off at discharge. She had urine specific gravity of 1.019, K 3.5 and sodium 128, which was thought to be possibly from SIADH so HCTZ, which takes for hypertension, was discontinued. She was given salt tablets with KCL with improvement of electrolytes. She has right breast cancer and follows up with [facility]. She is being followed by palliative care.   Past Medical History:  Diagnosis Date  . Bladder cystocele 05/21/2010  . Complication of anesthesia    deglutition in history  . Deglutition disorder 08/17/2012   Single episode of choking/aspiration; preceded by trouble swallowing saliva (thin liquids).  For SLP evaluation. Aug 17, 2012.    [name] Disease of blood and blood forming organ 01/05/2007   Qualifier: Diagnosis of  By: Oneida Alar MD, KARL    . Diverticulitis   . DIVERTICULOSIS OF COLON 03/05/2006   Qualifier: Diagnosis of  By: Samara Snide    . DIZZINESS 01/18/2010   Qualifier: Diagnosis of  By: Lindell Noe MD, Jeneen Rinks    . Essential hypertension, benign 02/22/2008   Qualifier: Diagnosis of  By: Lindell Noe MD, Jeneen Rinks     . Essential thrombocythemia ([place]) 01/31/11  . Family history of Alzheimer's disease 11/11/2013   Patient with family history of Alzheimers disease. She herself is very highly functional.  Plans for future visit with [facility] as primary function of the visit.    [name] GERD (gastroesophageal reflux disease) 08/16/2010  . HALLUX VALGUS, ACQUIRED 04/10/2009   Qualifier: Diagnosis of  By: [name] CMA,, [place]    . Hypertension   . HYPERTRIGLYCERIDEMIA 01/05/2007   Qualifier: Diagnosis of  By: Oneida Alar MD, KARL    . Insomnia 02/10/2014  . Left-sided chest wall pain 12/19/2011    L sided chest wall pain follows a dermatomal distribution and raises the possibility of thoracic radiculopathic pain,  which would be expected to improve with gabapentin as well. We discussed this at length and she will call to make me aware if this does not get better. Rib belt/girdle in the meantime; may consider rib films or CXR if not improving.     . Metatarsalgia of right foot 11/28/2013   Referral to [facility]   . Osteopenia   . Personal history of radiation therapy    03/2017 right breast  . Restless leg syndrome 06/09/2011  . Restless legs syndrome (RLS)   . URINARY INCONTINENCE, URGE, MILD 11/27/2009   Qualifier:  Diagnosis of  By: Zebedee Iba NP, Manuela Schwartz    . Vitamin D deficiency 08/16/2010    Past Surgical History:  Procedure Laterality Date  . BIOPSY BREAST Left 1998   Dr. Rebekah Chesterfield  . BREAST EXCISIONAL BIOPSY    . BREAST LUMPECTOMY Right 02/20/2017   Procedure: RIGHT BREAST LUMPECTOMY;  Surgeon: Jovita Kussmaul, MD;  Location: [place];  Service: General;  Laterality: Right;  . BREAST LUMPECTOMY Left 02/20/2017   left lumpectomy  . BREAST LUMPECTOMY WITH RADIOACTIVE SEED LOCALIZATION Left 02/20/2017   Procedure: LEFT BREAST LUMPECTOMY WITH RADIOACTIVE SEED LOCALIZATION;  Surgeon: Jovita Kussmaul, MD;  Location: [place];  Service: General;  Laterality: Left;  . CATARACT EXTRACTION  2010   Dr. [name]  . CHOLECYSTECTOMY  1992  . COLONOSCOPY  2008  . MOHS SURGERY  2012   on face [facility] [place]  . NAILBED REPAIR  12/05/2010   Procedure: NAILBED REPAIR;  Surgeon: Cammie Sickle., MD;  Location: [place];  Service: Orthopedics;  Laterality: Right;  full thickness nail biopsy right hallux  . torn tenden  2012   hand Dr. Orie Rout    Allergies  Allergen Reactions  . Doxycycline Nausea And Vomiting  . Macrodantin [Nitrofurantoin] Nausea And Vomiting  . Sulfamethoxazole-Trimethoprim Rash    REACTION: Rash after completing course of [place]    Outpatient Encounter Medications as of 05/30/2020  Medication Sig  . acetaminophen-codeine (TYLENOL #3) 300-30 MG tablet Take 1 tablet by mouth every 4 (four) hours as needed for moderate pain.  [name] aspirin 81 MG tablet Take 81 mg by mouth daily.  . calcium carbonate (OSCAL) 1500 (600 Ca) MG TABS tablet Take 600 mg of elemental calcium by mouth in the morning and at bedtime.   . cholecalciferol (VITAMIN D) 1000 UNITS tablet Take 2,000 Units by mouth daily.   . feeding supplement (ENSURE ENLIVE / ENSURE PLUS) LIQD Take 237 mLs by mouth 2 (two) times daily between meals.  . gabapentin (NEURONTIN) 300 MG  capsule Take One Capsule (300mg ) by mouth every morning and Take Three capsules (900mg ) by mouth every evening.  [name] GLUCOSAMINE-CHONDROITIN PO Take 2 tablets by mouth daily. 1500-1200  . ibuprofen (ADVIL,MOTRIN) 200 MG tablet Take 400 mg by mouth as needed for fever, headache or mild pain.  . Magnesium 500 MG TABS Take 1 tablet by mouth daily.  . metoprolol tartrate (LOPRESSOR) 25 MG tablet Take 0.5 tablets (12.5 mg total) by mouth 2 (two) times daily.  . Multiple Vitamin (MULTIVITAMIN WITH MINERALS) TABS tablet Take 1 tablet by mouth daily.  . Omega-3 Fatty Acids (FISH OIL PO) Take 1 capsule by mouth daily.  [name] omeprazole (PRILOSEC) 20 MG capsule Take one capsule by mouth once daily.  . polyethylene glycol (MIRALAX / GLYCOLAX) packet Take 17 g by mouth daily.  [name] pyridOXINE (VITAMIN B-6) 100 MG tablet Take 100 mg by  mouth daily.  . solifenacin (VESICARE) 10 MG tablet Take 1 tablet (10 mg total) by mouth daily.   No facility-administered encounter medications on file as of 05/30/2020.    Review of Systems:  Review of Systems  Constitutional: Positive for activity change. Negative for appetite change, chills and fever.  HENT: Negative for congestion.   Gastrointestinal: Negative for abdominal pain and constipation.  Genitourinary: Negative for difficulty urinating and dysuria.  Musculoskeletal: Positive for joint swelling. Negative for gait problem.  Skin: Negative for color change and rash.  Neurological: Negative for dizziness, light-headedness, numbness and headaches.  Psychiatric/Behavioral: Negative for agitation and confusion.    Health Maintenance  Topic Date Due  . TETANUS/TDAP  04/11/2019  . COVID-19 Vaccine (4 - Booster for Moderna series) 02/15/2020  . INFLUENZA VACCINE  08/06/2020  . DEXA SCAN  Completed  . PNA vac Low Risk Adult  Completed  . HPV VACCINES  Aged Out    Physical Exam: Vitals:   05/30/20 1333  Height: 5\' 3"  (1.6 m)   Body mass index is 21.28  kg/m. Physical Exam Constitutional:      Appearance: Normal appearance. She is normal weight.  HENT:     Head: Normocephalic and atraumatic.  Eyes:     Conjunctiva/sclera: Conjunctivae normal.  Cardiovascular:     Rate and Rhythm: Normal rate and regular rhythm.     Pulses: Normal pulses.     Heart sounds: Normal heart sounds.  Abdominal:     Palpations: Abdomen is soft.  Musculoskeletal:        General: Swelling present.     Cervical back: Normal range of motion.     Right lower leg: Edema present.     Comments: Trace edema on RLE  Skin:    General: Skin is warm and dry.  Neurological:     General: No focal deficit present.     Mental Status: She is alert and oriented to person, place, and time. Mental status is at baseline.     Motor: No weakness.  Psychiatric:        Mood and Affect: Mood normal.        Behavior: Behavior normal.     Labs reviewed: Basic Metabolic Panel: Recent Labs    05/10/20 0906 05/11/20 0452 05/12/20 0809  NA 128* 129* 132*  K 3.5 3.0* 3.9  CL 94* 92* 98  CO2 26 26 25   GLUCOSE 101* 93 130*  BUN 17 12 12   CREATININE 0.70 0.63 0.91  CALCIUM 8.4* 8.3* 8.7*  MG  --  1.8  --   PHOS  --  3.8  --    Liver Function Tests: Recent Labs    02/22/20 1000 04/12/20 0837 05/10/20 0906  AST 22 30 31   ALT 31 31 30   ALKPHOS 42 65 77  BILITOT 0.7 0.8 0.5  PROT 6.4* 6.2* 6.2*  ALBUMIN 3.8 3.8 3.7   CBC: Recent Labs    05/02/20 1024 05/10/20 0906 05/11/20 0452 05/17/20 1026 05/24/20 0944  WBC 17.9*   < > 13.1* 26.3* 28.2*  NEUTROABS 5.4  --   --  12.1* 9.6*  HGB 7.2*   < > 8.2* 8.0* 7.6*  HCT 22.2*   < > 25.4* 24.0* 23.3*  MCV 115.0*   < > 110.0* 107.1* 107.9*  PLT 179   < > 153 176 150   < > = values in this interval not displayed.   Lipid Panel: No results for input(s): CHOL, HDL, LDLCALC, TRIG,  CHOLHDL, LDLDIRECT in the last 8760 hours. No results found for: HGBA1C  Procedures since last visit: DG Ribs Unilateral W/Chest  Right  Result Date: 05/10/2020 CLINICAL DATA:  right rib/chest pain EXAM: RIGHT RIBS AND CHEST - 3+ VIEW COMPARISON:  None. FINDINGS: No evidence of displaced rib fracture. There is no evidence of pneumothorax. There is a small right pleural effusion. Both lungs are clear. Heart size and mediastinal contours are within normal limits. IMPRESSION: Small right pleural effusion. No evidence of displaced rib fracture. Electronically Signed   By: Maurine Simmering   On: 05/10/2020 09:40   CT Angio Chest PE W/Cm &/Or Wo Cm  Result Date: 05/10/2020 CLINICAL DATA:  Right-sided chest pain for several days, initial encounter EXAM: CT ANGIOGRAPHY CHEST WITH CONTRAST TECHNIQUE: Multidetector CT imaging of the chest was performed using the standard protocol during bolus administration of intravenous contrast. Multiplanar CT image reconstructions and MIPs were obtained to evaluate the vascular anatomy. CONTRAST:  63mL OMNIPAQUE IOHEXOL 350 MG/ML SOLN COMPARISON:  Chest x-ray from earlier in the same day. FINDINGS: Cardiovascular: Thoracic aorta demonstrates atherosclerotic calcifications without aneurysmal dilatation. No significant enhancement is noted to rule out dissection. Heart is at the upper limits of normal in size. The pulmonary artery shows a normal branching pattern without definitive filling defect to suggest pulmonary embolism. Mediastinum/Nodes: Thoracic inlet is within normal limits. No sizable hilar or mediastinal adenopathy is noted. The esophagus is within normal limits. Lungs/Pleura: Bibasilar atelectatic changes are noted right greater than left with associated right-sided pleural effusion. Somewhat lobulated nodular density is noted measuring 9 mm in the anterior aspect of the right upper lobe best seen on image number 46 of series 6. No other sizable nodules are seen. Upper Abdomen: Scattered prominent cysts are noted in the central portion of the liver measuring up to 3.8 cm. Musculoskeletal: No chest wall  abnormality. No acute or significant osseous findings. Review of the MIP images confirms the above findings. IMPRESSION: No evidence of pulmonary emboli. Bibasilar atelectasis with small right-sided pleural effusion. 9 mm lobulated nodular density in the right upper lobe. Consider one of the following in 3 months for both low-risk and high-risk individuals: (a) repeat chest CT, (b) follow-up PET-CT, or (c) tissue sampling. This recommendation follows the consensus statement: Guidelines for Management of Incidental Pulmonary Nodules Detected on CT Images: From the Fleischner Society 2017; Radiology 2017; 284:228-243. Aortic Atherosclerosis (ICD10-I70.0). Electronically Signed   By: Inez Catalina M.D.   On: 05/10/2020 12:32    Assessment/Plan  1. Neuropathy -  Will add Neurontin 300 mg at noon and continue Neurontin 300 mg in the morning and 900 mg at bedtime - gabapentin (NEURONTIN) 300 MG capsule; Take 1 capsule (300 mg total) by mouth daily at 12 noon.  Dispense: 30 capsule; Refill: 0  2. Edema of lower extremity - encouraged elevation of legs at night    Labs/tests ordered:  None   Next appt:  08/09/2020

## 2020-05-30 NOTE — Progress Notes (Signed)
[facility]   [phone] [phone]   Clinic Follow up Note   Patient Care Team: Mast, [name], NP as PCP - General (Internal Medicine) [name], MD as Consulting Physician (Dermatology) [name], MD as Consulting Physician ([place]) Jovita Kussmaul, MD as Consulting Physician (General Surgery) Raye Sorrow, MD as Consulting Physician (Pulmonary Disease) Truitt Merle, MD as Consulting Physician (Hematology) Gery Pray, MD as Consulting Physician (Radiation Oncology) Delice Bison, Charlestine Massed, NP as Nurse Practitioner (Hematology and Oncology) Annia Belt, MD as Consulting Physician (Oncology) Mauro Kaufmann, RN as Oncology Nurse Navigator Rockwell Germany, RN as Oncology Nurse Navigator  Date of Service:  05/31/2020  CHIEF COMPLAINT: f/u of MPN/MDS  SUMMARY OF ONCOLOGIC HISTORY: Oncology History Overview Note  Cancer Staging Malignant neoplasm of upper-inner quadrant of right breast in female, estrogen receptor positive ([place]) Staging form: Breast, AJCC 8th Edition - Clinical stage from 01/07/2017: Stage IA (cT1c, cN0, cM0, G3, ER: Positive, PR: Positive, HER2: Negative) - Signed by Truitt Merle, MD on 01/20/2017  Malignant neoplasm of upper-outer quadrant of left breast in female, estrogen receptor positive ([place]) Staging form: Breast, AJCC 8th Edition - Clinical stage from 01/07/2017: Stage IA (cT1b, cN0, cM0, G2, ER: Positive, PR: Positive, HER2: Negative) - Signed by Truitt Merle, MD on 01/20/2017     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 2.0 cm mass in the 2 o'clock position of the right breast with imaging features highly suspicious for malignancy. 2. 0.7 cm mass in the 2 o'clock position of the left breast with imaging features highly suspicious for malignancy. 3. No abnormal appearing axillary lymph nodes on  either side.   01/07/2017 Receptors her2   Right Breast: Prognostic indicators significant for: ER, 95% positive and PR, 50% positive, both with strong staining intensity. Proliferation marker Ki67 at 30. HER2 negative. RATIO OF HER2/CEP17 SIGNALS 0.81 AVERAGE HER2 COPY NUMBER PER CELL 1.70  Left Breast: Prognostic indicators significant for: ER, 100% positive and PR, 100% positive, both with strong staining intensity. Proliferation marker Ki67 at 10. HER2 negative. RATIO OF HER2/CEP17 SIGNALS 1.75 AVERAGE HER2 COPY NUMBER PER CELL 3.15   01/07/2017 Initial Biopsy   Diagnosis 1. Breast, right, needle core biopsy, 2:00 o'clock - INVASIVE DUCTAL CARCINOMA, SEE COMMENT. 2. Breast, left, needle core biopsy, 2:00 o'clock - INVASIVE MAMMARY CARCINOMA, SEE COMMENT.    01/20/2017 Initial Diagnosis   Malignant neoplasm of upper-inner quadrant of right breast in female, estrogen receptor positive ([place])   01/2017 - 03/2018 Anti-estrogen oral therapy   Anastrozole 1 mg daily started on 01/2017, stopped around 01/2018 due to diffuse joint pain. She switched to Exemestane on 03/26/2018 but stopped after 1 week due to gluteal pain.    02/20/2017 Surgery   RIGHT BREAST LUMPECTOMY (Right) LEFT BREAST LUMPECTOMY WITH RADIOACTIVE SEED LOCALIZATION (Left)  Per Dr. Marlou Starks    02/20/2017 Pathology Results    Diagnosis 1. Breast, lumpectomy, Left w/ seed - INVASIVE DUCTAL CARCINOMA, GRADE I/III, SPANNING 1.1 CM. - THE SURGICAL RESECTION MARGINS ARE NEGATIVE FOR CARCINOMA. - SEE ONCOLOGY TABLE BELOW. 2. Breast, lumpectomy, Right - INVASIVE DUCTAL CARCINOMA, GRADE II/III, SPANNING 2.1 CM. - DUCTAL CARCINOMA IN SITU, INTERMEDIATE GRADE. - PERINEURAL INVASION IS IDENTIFIED. - THE SURGICAL RESECTION MARGINS ARE NEGATIVE FOR CARCINOMA. - SEE ONCOLOGY TABLE BELOW. 3. Breast, excision, Right inferior - LOBULAR NEOPLASIA (ATYPICAL LOBULAR HYPERPLASIA). - FAT NECROSIS. - SEE COMMENT.  Microscopic Comment 1.  BREAST, INVASIVE TUMOR (LEFT) Procedure: Seed localized lumpectomy Laterality: Left Tumor Size: 1.1 cm (gross measurement). Histologic Type: Ductal Grade: I Tubular Differentiation: 2 Nuclear Pleomorphism: 1 Mitotic Count: 1 Ductal Carcinoma in Situ (DCIS): Not identified Extent of Tumor: Confined to breast parenchyma. Margins: Greater than 0.2 cm to all margins. Regional Lymph Nodes: None examined. Breast Prognostic Profile: Case [id number] Estrogen Receptor: 100%, strong Progesterone Receptor: 100%, strong Her2: No amplification was detected. The ratio was 0.81 Ki-67: 10% Best tumor block for sendout testing: 1B Pathologic Stage Classification (pTNM, AJCC 8th Edition): Primary Tumor (pT): pT1c Regional Lymph Nodes (pN): pNX Distant Metastases (pM): pMX  2. BREAST, INVASIVE TUMOR (RIGHT) Procedure: Lumpectomy Laterality: Right Tumor Size: 2.1 cm (gross measurement) Histologic Type: Ductal Grade: II Tubular Differentiation: 3 Nuclear Pleomorphism: 2 Mitotic Count: 1 Ductal Carcinoma in Situ (DCIS): Present, intermediate grade Extent of Tumor: Confined to breast parenchyma. Margins: Greater than 0.2 cm to all margins Regional Lymph Nodes: None examined. Breast Prognostic Profile: Case [id number] Estrogen Receptor: 95%, strong Progesterone Receptor: 50%, strong Her2: No amplification was detected. The ratio was 0.81 Ki-67: 30% Best tumor block for sendout testing: 2A Pathologic Stage Classification (pTNM, AJCC 8th Edition): Primary Tumor (pT): pT2 Regional Lymph Nodes (pN): pNX Distant Metastases (pM): pMX 3. The surgical resection margin(s) of the specimen were inked and microscopically evaluated.   03/31/2017 - 05/11/2017 Radiation Therapy   1. Right Breast / 50 Gy in 25 fractions 2. Right Axilla / 45 Gy in 25 fractions 3. Right Breast Boost / 10 Gy in 5 fractions   Malignant neoplasm of upper-outer quadrant of left breast in female, estrogen receptor  positive ([place])  01/20/2017 Initial Diagnosis   Malignant neoplasm of upper-outer quadrant of left breast in female, estrogen receptor positive ([place])   02/20/2017 Surgery   RIGHT BREAST LUMPECTOMY (Right) LEFT BREAST LUMPECTOMY WITH RADIOACTIVE SEED LOCALIZATION (Left)  Per Dr. Marlou Starks     02/12/2018 Mammogram   IMPRESSION: No mammographic evidence of breast malignancy       CURRENT THERAPY:  Epogen weekly starting 05/11/20. She was switched to Retacrit from 05/17/20 and dose increased as needed  Blood transfusion if Hg<8.0   INTERVAL HISTORY:  Barrie Sigmund is here for a follow up of MPN/MDS. She was last seen by me 05/17/20. She presents to the clinic with her daughter, who is from [place].  She came in a wheelchair today. She developed right side pain and weakness at 4pm 2 days ago, it hurts when she moves or change her position, especially getting out of the chair.  The pain is minimal when she sits.  She has been not able to walk much because of the pain.  She was seen by her primary care physician, who increased her Neurontin dose. she denies any headaches, blurry vision, or other new neurological symptoms.  She still has mild right-sided chest pain, no fever or chills.  She is overall quite fatigued, appetite is low.  Daughter came from [place] to be with her, and may stay little longer until her care plan is in place. She is also thinking about moving to skilled nursing home at [facility].   All other systems were reviewed with the patient and are negative.  MEDICAL HISTORY:  Past Medical History:  Diagnosis Date  . Bladder cystocele 05/21/2010  . Complication of anesthesia    deglutition in history  . Deglutition disorder 08/17/2012   Single episode of choking/aspiration; preceded by trouble swallowing saliva (  thin liquids).  For SLP evaluation. Aug 17, 2012.    [name] Disease of blood and blood forming organ 01/05/2007   Qualifier: Diagnosis of  By: Oneida Alar MD, KARL    .  Diverticulitis   . DIVERTICULOSIS OF COLON 03/05/2006   Qualifier: Diagnosis of  By: Samara Snide    . DIZZINESS 01/18/2010   Qualifier: Diagnosis of  By: Lindell Noe MD, Jeneen Rinks    . Essential hypertension, benign 02/22/2008   Qualifier: Diagnosis of  By: Lindell Noe MD, Jeneen Rinks     . Essential thrombocythemia ([place]) 01/31/11  . Family history of Alzheimer's disease 11/11/2013   Patient with family history of Alzheimers disease. She herself is very highly functional.  Plans for future visit with MMSE as primary function of the visit.    [name] GERD (gastroesophageal reflux disease) 08/16/2010  . HALLUX VALGUS, ACQUIRED 04/10/2009   Qualifier: Diagnosis of  By: [name] CMA,, [place]    . Hypertension   . HYPERTRIGLYCERIDEMIA 01/05/2007   Qualifier: Diagnosis of  By: Oneida Alar MD, KARL    . Insomnia 02/10/2014  . Left-sided chest wall pain 12/19/2011    L sided chest wall pain follows a dermatomal distribution and raises the possibility of thoracic radiculopathic pain, which would be expected to improve with gabapentin as well. We discussed this at length and she will call to make me aware if this does not get better. Rib belt/girdle in the meantime; may consider rib films or CXR if not improving.     . Metatarsalgia of right foot 11/28/2013   Referral to [facility]   . Osteopenia   . Personal history of radiation therapy    03/2017 right breast  . Restless leg syndrome 06/09/2011  . Restless legs syndrome (RLS)   . URINARY INCONTINENCE, URGE, MILD 11/27/2009   Qualifier: Diagnosis of  By: Zebedee Iba NP, Manuela Schwartz    . Vitamin D deficiency 08/16/2010    SURGICAL HISTORY: Past Surgical History:  Procedure Laterality Date  . BIOPSY BREAST Left 1998   Dr. Rebekah Chesterfield  . BREAST EXCISIONAL BIOPSY    . BREAST LUMPECTOMY Right 02/20/2017   Procedure: RIGHT BREAST LUMPECTOMY;  Surgeon: Jovita Kussmaul, MD;  Location: [facility];  Service: General;  Laterality: Right;  . BREAST LUMPECTOMY Left 02/20/2017   left lumpectomy  . BREAST  LUMPECTOMY WITH RADIOACTIVE SEED LOCALIZATION Left 02/20/2017   Procedure: LEFT BREAST LUMPECTOMY WITH RADIOACTIVE SEED LOCALIZATION;  Surgeon: Jovita Kussmaul, MD;  Location: [facility];  Service: General;  Laterality: Left;  . CATARACT EXTRACTION  2010   Dr. [name]  . CHOLECYSTECTOMY  1992  . COLONOSCOPY  2008  . MOHS SURGERY  2012   on face [facility] [place]  . NAILBED REPAIR  12/05/2010   Procedure: NAILBED REPAIR;  Surgeon: Cammie Sickle., MD;  Location: [facility];  Service: Orthopedics;  Laterality: Right;  full thickness nail biopsy right hallux  . torn tenden  2012   hand Dr. Orie Rout    I have reviewed the social history and family history with the patient and they are unchanged from previous note.  ALLERGIES:  is allergic to doxycycline, macrodantin [nitrofurantoin], and sulfamethoxazole-trimethoprim.  MEDICATIONS:  Current Outpatient Medications  Medication Sig Dispense Refill  . traMADol (ULTRAM) 50 MG tablet Take 1 tablet (50 mg total) by mouth every 6 (six) hours as needed. 30 tablet 0  . acetaminophen-codeine (TYLENOL #3) 300-30 MG tablet Take 1 tablet by mouth every 4 (four) hours as needed  for moderate pain. 20 tablet 0  . aspirin 81 MG tablet Take 81 mg by mouth daily.    . calcium carbonate (OSCAL) 1500 (600 Ca) MG TABS tablet Take 600 mg of elemental calcium by mouth in the morning and at bedtime.     . cholecalciferol (VITAMIN D) 1000 UNITS tablet Take 2,000 Units by mouth daily.     . feeding supplement (ENSURE ENLIVE / ENSURE PLUS) LIQD Take 237 mLs by mouth 2 (two) times daily between meals. 14220 mL 0  . gabapentin (NEURONTIN) 300 MG capsule Take One Capsule (330m) by mouth every morning and Take Three capsules (9068m by mouth every evening. 360 capsule 1  . gabapentin (NEURONTIN) 300 MG capsule Take 1 capsule (300 mg total) by mouth daily at 12 noon. 30 capsule 0  . GLUCOSAMINE-CHONDROITIN PO Take 2 tablets by  mouth daily. 1500-1200    . ibuprofen (ADVIL,MOTRIN) 200 MG tablet Take 400 mg by mouth as needed for fever, headache or mild pain.    . Magnesium 500 MG TABS Take 1 tablet by mouth daily.    . metoprolol tartrate (LOPRESSOR) 25 MG tablet Take 0.5 tablets (12.5 mg total) by mouth 2 (two) times daily. 90 tablet 1  . Multiple Vitamin (MULTIVITAMIN WITH MINERALS) TABS tablet Take 1 tablet by mouth daily. 30 tablet 0  . Omega-3 Fatty Acids (FISH OIL PO) Take 1 capsule by mouth daily.    . Marland Kitchenmeprazole (PRILOSEC) 20 MG capsule Take one capsule by mouth once daily. 90 capsule 1  . polyethylene glycol (MIRALAX / GLYCOLAX) packet Take 17 g by mouth daily.    . Marland KitchenyridOXINE (VITAMIN B-6) 100 MG tablet Take 100 mg by mouth daily.    . solifenacin (VESICARE) 10 MG tablet Take 1 tablet (10 mg total) by mouth daily. 90 tablet 1   No current facility-administered medications for this visit.    PHYSICAL EXAMINATION: ECOG PERFORMANCE STATUS: 3 - Symptomatic, >50% confined to bed  Vitals:   05/31/20 0944  BP: 132/66  Pulse: 85  Resp: 16  Temp: 97.7 F (36.5 C)  SpO2: 92%   Filed Weights   05/31/20 0944  Weight: 122 lb 4.8 oz (55.5 kg)    GENERAL:alert, no distress and comfortable SKIN: skin color, texture, turgor are normal, no rashes or significant lesions EYES: normal, Conjunctiva are pink and non-injected, sclera clear NECK: supple, thyroid normal size, non-tender, without nodularity LYMPH:  no palpable lymphadenopathy in the cervical, axillary  LUNGS: clear to auscultation and percussion with normal breathing effort HEART: regular rate & rhythm and no murmurs and no lower extremity edema ABDOMEN:abdomen soft, non-tender and normal bowel sounds Musculoskeletal:no cyanosis of digits and no clubbing  NEURO: alert & oriented x 3 with fluent speech, no focal motor/sensory deficits except mild weakness on bilateral LE, more on right   LABORATORY DATA:  I have reviewed the data as listed CBC  Latest Ref Rng & Units 05/31/2020 05/24/2020 05/17/2020  WBC 4.0 - 10.5 K/uL 26.4(H) 28.2(H) 26.3(H)  Hemoglobin 12.0 - 15.0 g/dL 8.3(L) 7.6(L) 8.0(L)  Hematocrit 36.0 - 46.0 % 25.7(L) 23.3(L) 24.0(L)  Platelets 150 - 400 K/uL 102(L) 150 176     CMP Latest Ref Rng & Units 05/12/2020 05/11/2020 05/10/2020  Glucose 70 - 99 mg/dL 130(H) 93 101(H)  BUN 8 - 23 mg/dL 12 12 17   Creatinine 0.44 - 1.00 mg/dL 0.91 0.63 0.70  Sodium 135 - 145 mmol/L 132(L) 129(L) 128(L)  Potassium 3.5 - 5.1 mmol/L 3.9 3.0(L) 3.5  Chloride 98 - 111 mmol/L 98 92(L) 94(L)  CO2 22 - 32 mmol/L 25 26 26   Calcium 8.9 - 10.3 mg/dL 8.7(L) 8.3(L) 8.4(L)  Total Protein 6.5 - 8.1 g/dL - - 6.2(L)  Total Bilirubin 0.3 - 1.2 mg/dL - - 0.5  Alkaline Phos 38 - 126 U/L - - 77  AST 15 - 41 U/L - - 31  ALT 0 - 44 U/L - - 30      RADIOGRAPHIC STUDIES: I have personally reviewed the radiological images as listed and agreed with the findings in the report. No results found.   ASSESSMENT & PLAN:  Jill Myers is a 87 y.o. female with    1.Essential thrombocythemia, new anemia, evolving to MPN/MDS, with complex genetic changes, high risk MDS   -Diagnosed in 2005, has been on Hydrea since then on 07 March 2020.Stopped due to moderate anemia -Patient developed moderate symptomatic anemia required blood transfusion in March 2022 -Bone marrow biopsy showed 5 to 10% blasts,with left shift of marrow cells. Bone marrow is significantly hypercellular (>90%).This is concerning for MPN evolving to preleukemia, such as MDS. Cytogenetics and FISH results showed multiple complex geneticabnormalities, predicts high risk MDSand poor prognosis(median survival less than 1 year). -I discussed she may develop acute leukemia down the road.Her moderate anemia is related to the disease progression of her ET to MDS/MPN -Bone marrow patient panel showed multiple genomic abnormalities, including 5 q. deletion, trisomy 8, monosomy 7, trisomy 11  q., trisomy 11 q. This predicts high risk MDS, and likely rapidly evolving to AML in near future. -I have discussed the treatment options with patient, including chemo with hypomethylation agents, such as Vidaza injections, or supportive care with blood transfusion and EPO injection.  Patient declined chemotherapy -she started EPO weekly, has had limited response so far -Labs reviewed, Hgb 8.3 today, no need for blood transfusion. Her Hg has not improved since she started EPO injection, will increase Retacrit dose to 40,000 units weekly   2. Chest Pain and right side leg and arm pain -her chest pain is secondary to right-sided pleuritis  -She developed sudden onset right-sided arm and leg pain, especially with position change, likely muscular and joint pain, no neurologic deficit on exam or suspicion for stroke. -Her primary care physician has slightly increased her Neurontin dose (take 1 extra around noon), which I agree.  If not adequate, we can add Cymbalta -I also called in tramadol as needed for her, management of constipation reviewed with her.  3. Newly diagnosed left breast DCIS, high grade , ER-/PR- -surgery is on hold due to her recent anemia and MDS diagnosis and related overall poor prognosis   4.Bilateral breast cancer, invasive ductal carcinoma, right upper inner quadrant, pT2N0M0, stage IA, ER+/PR+/HER2-, G3, left upper-outer quadrant,pT1cN0M0, stage IA, ER+/PR+/HER2-, G2 --She was diagnosed on 01/20/2017, and is s/pbilateral lumpectomy with negative margins,followed by adjuvant right breastand axillaryradiation. -Shetried anastrozole and Exemestane from 01/2017-01/2018 but stopped due to MSK pain. -Continue cancer surveillance  5. Goal of care discussion  -We again discussed the incurable nature of her cancer, and the overall poor prognosis, especially she is at very high risk to develop AML -The patient understands the goal of care is palliative, she declined chemo. -I  recommend DNR/DNI, she will think about it  -I have referred her to palliative care.  We discussed hospice today, given her overall deterioration, especially poor performance status, I think it is reasonable to transition her to hospice, if she agrees to stop  medical treatment and a blood transfusion for her anemia.  We discussed the logistics and what hospice can provide. She will think about it -We also discussed her personal care she needs at home. She will activate her long-term care policy.  I also encouraged her to consider moved to skilled nursing home.    Plan -Proceed to Retacrit today at increased dose 40KU and continue weekly -No blood transfusion this week -lab and injection weekly, consider blood transfusion if hemoglobin less than 8.0, or symptomatic anemia with hemoglobin less than 8.5 -Lab and follow-up in 1 week -will order wheelchair and walker for her, we also communicated with her palliative care team, they will follow-up with her  No problem-specific Assessment & Plan notes found for this encounter.   Orders Placed This Encounter  Procedures  . For home use only DME 4 wheeled rolling walker with seat    Order Specific Question:   Patient needs a walker to treat with the following condition    Answer:   Muscle weakness (generalized) [728.87.ICD-9-CM]    Order Specific Question:   Patient needs a walker to treat with the following condition    Answer:   Balance problem [244025]    Order Specific Question:   Patient needs a walker to treat with the following condition    Answer:   MDS (myelodysplastic syndrome) ([place]) [144360]  . For home use only DME standard manual wheelchair with seat cushion    Patient suffers from MDS, generalized weakness, anemia which impairs their ability to perform daily activities like ADL's walking more than 30 feet in the home.  A walker will not resolve issue with performing activities of daily living. A wheelchair will allow patient to safely  perform daily activities and travel to appointments. Patient can safely propel the wheelchair in the home or has a caregiver who can provide assistance. Length of need lifetime. Accessories: elevating leg rests (ELRs), wheel locks, extensions and anti-tippers.  . DME Bedside commode    Order Specific Question:   Patient needs a bedside commode to treat with the following condition    Answer:   Weakness [241835]    Order Specific Question:   Patient needs a bedside commode to treat with the following condition    Answer:   MDS (myelodysplastic syndrome) ([place]) [165800]   All questions were answered. The patient knows to call the clinic with any problems, questions or concerns. No barriers to learning was detected. The total time spent in the appointment was 40 minutes.     Truitt Merle, MD 05/31/2020   I, Joslyn Devon, am acting as scribe for Truitt Merle, MD.   I have reviewed the above documentation for accuracy and completeness, and I agree with the above.

## 2020-05-30 NOTE — Telephone Encounter (Signed)
Jill Myers daughter called stating that Jill Myers is having edema and pain in her feet and legs making it difficulty to walk.  She is requesting an appt with Dr Burr Medico.  I spoke with Jill Myers son appts made for Dr Burr Medico tomorrow he will relate information.

## 2020-05-30 NOTE — Patient Instructions (Addendum)
Neuropathic Pain Neuropathic pain is pain caused by damage to the nerves that are responsible for certain sensations in your body (sensory nerves). The pain can be caused by:  Damage to the sensory nerves that send signals to your spinal cord and brain (peripheral nervous system).  Damage to the sensory nerves in your brain or spinal cord (central nervous system). Neuropathic pain can make you more sensitive to pain. Even a minor sensation can feel very painful. This is usually a long-term condition that can be difficult to treat. The type of pain differs from person to person. It may:  Start suddenly (acute), or it may develop slowly and last for a long time (chronic).  Come and go as damaged nerves heal, or it may stay at the same level for years.  Cause emotional distress, loss of sleep, and a lower quality of life. What are the causes? The most common cause of this condition is diabetes. Many other diseases and conditions can also cause neuropathic pain. Causes of neuropathic pain can be classified as:  Toxic. This is caused by medicines and chemicals. The most common cause of toxic neuropathic pain is damage from cancer treatments (chemotherapy).  Metabolic. This can be caused by: ? Diabetes. This is the most common disease that damages the nerves. ? Lack of vitamin B from long-term alcohol abuse.  Traumatic. Any injury that cuts, crushes, or stretches a nerve can cause damage and pain. A common example is feeling pain after losing an arm or leg (phantom limb pain).  Compression-related. If a sensory nerve gets trapped or compressed for a long period of time, the blood supply to the nerve can be cut off.  Vascular. Many blood vessel diseases can cause neuropathic pain by decreasing blood supply and oxygen to nerves.  Autoimmune. This type of pain results from diseases in which the body's defense system (immune system) mistakenly attacks sensory nerves. Examples of autoimmune diseases  that can cause neuropathic pain include lupus and multiple sclerosis.  Infectious. Many types of viral infections can damage sensory nerves and cause pain. Shingles infection is a common cause of this type of pain.  Inherited. Neuropathic pain can be a symptom of many diseases that are passed down through families (genetic). What increases the risk? You are more likely to develop this condition if:  You have diabetes.  You smoke.  You drink too much alcohol.  You are taking certain medicines, including medicines that kill cancer cells (chemotherapy) or that treat immune system disorders. What are the signs or symptoms? The main symptom is pain. Neuropathic pain is often described as:  Burning.  Shock-like.  Stinging.  Hot or cold.  Itching. How is this diagnosed? No single test can diagnose neuropathic pain. It is diagnosed based on:  Physical exam and your symptoms. Your health care provider will ask you about your pain. You may be asked to use a pain scale to describe how bad your pain is.  Tests. These may be done to see if you have a high sensitivity to pain and to help find the cause and location of any sensory nerve damage. They include: ? Nerve conduction studies to test how well nerve signals travel through your sensory nerves (electrodiagnostic testing). ? Stimulating your sensory nerves through electrodes on your skin and measuring the response in your spinal cord and brain (somatosensory evoked potential).  Imaging studies, such as: ? X-rays. ? CT scan. ? MRI. How is this treated? Treatment for neuropathic pain may change   over time. You may need to try different treatment options or a combination of treatments. Some options include:  Treating the underlying cause of the neuropathy, such as diabetes, kidney disease, or vitamin deficiencies.  Stopping medicines that can cause neuropathy, such as chemotherapy.  Medicine to relieve pain. Medicines may  include: ? Prescription or over-the-counter pain medicine. ? Anti-seizure medicine. ? Antidepressant medicines. ? Pain-relieving patches that are applied to painful areas of skin. ? A medicine to numb the area (local anesthetic), which can be injected as a nerve block.  Transcutaneous nerve stimulation. This uses electrical currents to block painful nerve signals. The treatment is painless.  Alternative treatments, such as: ? Acupuncture. ? Meditation. ? Massage. ? Physical therapy. ? Pain management programs. ? Counseling. Follow these instructions at home: Medicines  Take over-the-counter and prescription medicines only as told by your health care provider.  Do not drive or use heavy machinery while taking prescription pain medicine.  If you are taking prescription pain medicine, take actions to prevent or treat constipation. Your health care provider may recommend that you: ? Drink enough fluid to keep your urine pale yellow. ? Eat foods that are high in fiber, such as fresh fruits and vegetables, whole grains, and beans. ? Limit foods that are high in fat and processed sugars, such as fried or sweet foods. ? Take an over-the-counter or prescription medicine for constipation.   Lifestyle  Have a good support system at home.  Consider joining a chronic pain support group.  Do not use any products that contain nicotine or tobacco, such as cigarettes and e-cigarettes. If you need help quitting, ask your health care provider.  Do not drink alcohol.   General instructions  Learn as much as you can about your condition.  Work closely with all your health care providers to find the treatment plan that works best for you.  Ask your health care provider what activities are safe for you.  Keep all follow-up visits as told by your health care provider. This is important. Contact a health care provider if:  Your pain treatments are not working.  You are having side effects  from your medicines.  You are struggling with tiredness (fatigue), mood changes, depression, or anxiety. Summary  Neuropathic pain is pain caused by damage to the nerves that are responsible for certain sensations in your body (sensory nerves).  Neuropathic pain may come and go as damaged nerves heal, or it may stay at the same level for years.  Neuropathic pain is usually a long-term condition that can be difficult to treat. Consider joining a chronic pain support group. This information is not intended to replace advice given to you by your health care provider. Make sure you discuss any questions you have with your health care provider. Document Revised: 04/15/2018 Document Reviewed: 01/09/2017 Elsevier Patient Education  2021 [employer].  Neuropathic Pain Neuropathic pain is pain caused by damage to the nerves that are responsible for certain sensations in your body (sensory nerves). The pain can be caused by:  Damage to the sensory nerves that send signals to your spinal cord and brain (peripheral nervous system).  Damage to the sensory nerves in your brain or spinal cord (central nervous system). Neuropathic pain can make you more sensitive to pain. Even a minor sensation can feel very painful. This is usually a long-term condition that can be difficult to treat. The type of pain differs from person to person. It may:  Start suddenly (acute), or  it may develop slowly and last for a long time (chronic).  Come and go as damaged nerves heal, or it may stay at the same level for years.  Cause emotional distress, loss of sleep, and a lower quality of life. What are the causes? The most common cause of this condition is diabetes. Many other diseases and conditions can also cause neuropathic pain. Causes of neuropathic pain can be classified as:  Toxic. This is caused by medicines and chemicals. The most common cause of toxic neuropathic pain is damage from cancer treatments  (chemotherapy).  Metabolic. This can be caused by: ? Diabetes. This is the most common disease that damages the nerves. ? Lack of vitamin B from long-term alcohol abuse.  Traumatic. Any injury that cuts, crushes, or stretches a nerve can cause damage and pain. A common example is feeling pain after losing an arm or leg (phantom limb pain).  Compression-related. If a sensory nerve gets trapped or compressed for a long period of time, the blood supply to the nerve can be cut off.  Vascular. Many blood vessel diseases can cause neuropathic pain by decreasing blood supply and oxygen to nerves.  Autoimmune. This type of pain results from diseases in which the body's defense system (immune system) mistakenly attacks sensory nerves. Examples of autoimmune diseases that can cause neuropathic pain include lupus and multiple sclerosis.  Infectious. Many types of viral infections can damage sensory nerves and cause pain. Shingles infection is a common cause of this type of pain.  Inherited. Neuropathic pain can be a symptom of many diseases that are passed down through families (genetic). What increases the risk? You are more likely to develop this condition if:  You have diabetes.  You smoke.  You drink too much alcohol.  You are taking certain medicines, including medicines that kill cancer cells (chemotherapy) or that treat immune system disorders. What are the signs or symptoms? The main symptom is pain. Neuropathic pain is often described as:  Burning.  Shock-like.  Stinging.  Hot or cold.  Itching. How is this diagnosed? No single test can diagnose neuropathic pain. It is diagnosed based on:  Physical exam and your symptoms. Your health care provider will ask you about your pain. You may be asked to use a pain scale to describe how bad your pain is.  Tests. These may be done to see if you have a high sensitivity to pain and to help find the cause and location of any sensory  nerve damage. They include: ? Nerve conduction studies to test how well nerve signals travel through your sensory nerves (electrodiagnostic testing). ? Stimulating your sensory nerves through electrodes on your skin and measuring the response in your spinal cord and brain (somatosensory evoked potential).  Imaging studies, such as: ? X-rays. ? CT scan. ? MRI. How is this treated? Treatment for neuropathic pain may change over time. You may need to try different treatment options or a combination of treatments. Some options include:  Treating the underlying cause of the neuropathy, such as diabetes, kidney disease, or vitamin deficiencies.  Stopping medicines that can cause neuropathy, such as chemotherapy.  Medicine to relieve pain. Medicines may include: ? Prescription or over-the-counter pain medicine. ? Anti-seizure medicine. ? Antidepressant medicines. ? Pain-relieving patches that are applied to painful areas of skin. ? A medicine to numb the area (local anesthetic), which can be injected as a nerve block.  Transcutaneous nerve stimulation. This uses electrical currents to block painful nerve signals. The  treatment is painless.  Alternative treatments, such as: ? Acupuncture. ? Meditation. ? Massage. ? Physical therapy. ? Pain management programs. ? Counseling. Follow these instructions at home: Medicines  Take over-the-counter and prescription medicines only as told by your health care provider.  Do not drive or use heavy machinery while taking prescription pain medicine.  If you are taking prescription pain medicine, take actions to prevent or treat constipation. Your health care provider may recommend that you: ? Drink enough fluid to keep your urine pale yellow. ? Eat foods that are high in fiber, such as fresh fruits and vegetables, whole grains, and beans. ? Limit foods that are high in fat and processed sugars, such as fried or sweet foods. ? Take an  over-the-counter or prescription medicine for constipation.   Lifestyle  Have a good support system at home.  Consider joining a chronic pain support group.  Do not use any products that contain nicotine or tobacco, such as cigarettes and e-cigarettes. If you need help quitting, ask your health care provider.  Do not drink alcohol.   General instructions  Learn as much as you can about your condition.  Work closely with all your health care providers to find the treatment plan that works best for you.  Ask your health care provider what activities are safe for you.  Keep all follow-up visits as told by your health care provider. This is important. Contact a health care provider if:  Your pain treatments are not working.  You are having side effects from your medicines.  You are struggling with tiredness (fatigue), mood changes, depression, or anxiety. Summary  Neuropathic pain is pain caused by damage to the nerves that are responsible for certain sensations in your body (sensory nerves).  Neuropathic pain may come and go as damaged nerves heal, or it may stay at the same level for years.  Neuropathic pain is usually a long-term condition that can be difficult to treat. Consider joining a chronic pain support group. This information is not intended to replace advice given to you by your health care provider. Make sure you discuss any questions you have with your health care provider. Document Revised: 04/15/2018 Document Reviewed: 01/09/2017 Elsevier Patient Education  2021 [employer].

## 2020-05-31 ENCOUNTER — Inpatient Hospital Stay: Payer: Medicare PPO

## 2020-05-31 ENCOUNTER — Inpatient Hospital Stay (HOSPITAL_BASED_OUTPATIENT_CLINIC_OR_DEPARTMENT_OTHER): Payer: Medicare PPO | Admitting: Hematology

## 2020-05-31 ENCOUNTER — Encounter: Payer: Self-pay | Admitting: Hematology

## 2020-05-31 ENCOUNTER — Telehealth: Payer: Self-pay

## 2020-05-31 ENCOUNTER — Telehealth: Payer: Self-pay | Admitting: Student

## 2020-05-31 ENCOUNTER — Other Ambulatory Visit: Payer: Medicare PPO

## 2020-05-31 VITALS — BP 132/66 | HR 85 | Temp 97.7°F | Resp 16 | Ht 63.0 in | Wt 122.3 lb

## 2020-05-31 DIAGNOSIS — Z170 Estrogen receptor positive status [ER+]: Secondary | ICD-10-CM

## 2020-05-31 DIAGNOSIS — M6281 Muscle weakness (generalized): Secondary | ICD-10-CM | POA: Diagnosis not present

## 2020-05-31 DIAGNOSIS — I10 Essential (primary) hypertension: Secondary | ICD-10-CM | POA: Diagnosis not present

## 2020-05-31 DIAGNOSIS — D469 Myelodysplastic syndrome, unspecified: Secondary | ICD-10-CM | POA: Diagnosis not present

## 2020-05-31 DIAGNOSIS — Z79899 Other long term (current) drug therapy: Secondary | ICD-10-CM | POA: Diagnosis not present

## 2020-05-31 DIAGNOSIS — D0512 Intraductal carcinoma in situ of left breast: Secondary | ICD-10-CM | POA: Diagnosis not present

## 2020-05-31 DIAGNOSIS — Z853 Personal history of malignant neoplasm of breast: Secondary | ICD-10-CM | POA: Diagnosis not present

## 2020-05-31 DIAGNOSIS — Z7982 Long term (current) use of aspirin: Secondary | ICD-10-CM | POA: Diagnosis not present

## 2020-05-31 DIAGNOSIS — D649 Anemia, unspecified: Secondary | ICD-10-CM

## 2020-05-31 DIAGNOSIS — M85851 Other specified disorders of bone density and structure, right thigh: Secondary | ICD-10-CM

## 2020-05-31 LAB — CBC WITH DIFFERENTIAL (CANCER CENTER ONLY)
Abs Immature Granulocytes: 5.5 10*3/uL — ABNORMAL HIGH (ref 0.00–0.07)
Band Neutrophils: 2 %
Basophils Absolute: 0 10*3/uL (ref 0.0–0.1)
Basophils Relative: 0 %
Blasts: 3 %
Eosinophils Absolute: 0.3 10*3/uL (ref 0.0–0.5)
Eosinophils Relative: 1 %
HCT: 25.7 % — ABNORMAL LOW (ref 36.0–46.0)
Hemoglobin: 8.3 g/dL — ABNORMAL LOW (ref 12.0–15.0)
Lymphocytes Relative: 13 %
Lymphs Abs: 3.4 10*3/uL (ref 0.7–4.0)
MCH: 33.7 pg (ref 26.0–34.0)
MCHC: 32.3 g/dL (ref 30.0–36.0)
MCV: 104.5 fL — ABNORMAL HIGH (ref 80.0–100.0)
Metamyelocytes Relative: 8 %
Monocytes Absolute: 5.5 10*3/uL — ABNORMAL HIGH (ref 0.1–1.0)
Monocytes Relative: 21 %
Myelocytes: 13 %
Neutro Abs: 10.8 10*3/uL — ABNORMAL HIGH (ref 1.7–7.7)
Neutrophils Relative %: 39 %
Platelet Count: 102 10*3/uL — ABNORMAL LOW (ref 150–400)
RBC: 2.46 MIL/uL — ABNORMAL LOW (ref 3.87–5.11)
RDW: 21.7 % — ABNORMAL HIGH (ref 11.5–15.5)
WBC Count: 26.4 10*3/uL — ABNORMAL HIGH (ref 4.0–10.5)
nRBC: 1.4 % — ABNORMAL HIGH (ref 0.0–0.2)

## 2020-05-31 LAB — SAMPLE TO BLOOD BANK

## 2020-05-31 MED ORDER — EPOETIN ALFA-EPBX 40000 UNIT/ML IJ SOLN
40000.0000 [IU] | Freq: Once | INTRAMUSCULAR | Status: AC
  Administered 2020-05-31: 40000 [IU] via SUBCUTANEOUS

## 2020-05-31 MED ORDER — EPOETIN ALFA-EPBX 40000 UNIT/ML IJ SOLN
INTRAMUSCULAR | Status: AC
  Filled 2020-05-31: qty 1

## 2020-05-31 MED ORDER — TRAMADOL HCL 50 MG PO TABS: 50.0000 mg | ORAL_TABLET | Freq: Four times a day (QID) | ORAL | 0 refills | Status: DC | PRN

## 2020-05-31 NOTE — Telephone Encounter (Signed)
(  2:50p) SW attempted to contact patient's son per request of NP-L. Rivers. SW left a message on his voicemail requesting a call.

## 2020-05-31 NOTE — Patient Instructions (Signed)

## 2020-05-31 NOTE — Telephone Encounter (Signed)
Palliative NP spoke with patient and her daughter. Discussed new onset of pain, decline since patient seen last Thursday. Patient encouraged to continue gabapentin, will start tramadol today for pain. We discussed Palliative vs. Hospice services. F/u appt scheduled for Tuesday at 11am.

## 2020-06-01 ENCOUNTER — Encounter: Payer: Self-pay | Admitting: Hematology

## 2020-06-04 ENCOUNTER — Encounter: Payer: Self-pay | Admitting: Hematology

## 2020-06-05 ENCOUNTER — Other Ambulatory Visit: Payer: Self-pay

## 2020-06-05 ENCOUNTER — Encounter: Payer: Self-pay | Admitting: Hematology

## 2020-06-05 ENCOUNTER — Other Ambulatory Visit: Payer: Medicare PPO | Admitting: Student

## 2020-06-05 DIAGNOSIS — D469 Myelodysplastic syndrome, unspecified: Secondary | ICD-10-CM

## 2020-06-05 DIAGNOSIS — Z515 Encounter for palliative care: Secondary | ICD-10-CM

## 2020-06-05 DIAGNOSIS — R630 Anorexia: Secondary | ICD-10-CM

## 2020-06-05 DIAGNOSIS — R600 Localized edema: Secondary | ICD-10-CM

## 2020-06-05 DIAGNOSIS — R52 Pain, unspecified: Secondary | ICD-10-CM

## 2020-06-05 DIAGNOSIS — R531 Weakness: Secondary | ICD-10-CM

## 2020-06-05 NOTE — Progress Notes (Signed)
[facility] Consult Note Telephone: [phone]  Fax: [phone]    Date of encounter: 06/05/20 PATIENT NAME: Jill Myers [id number]   [phone] (home)  DOB: 06-27-1930 MRN: [id number] PRIMARY CARE PROVIDER:    Mast, [name], NP,  [address] [address] [phone]  REFERRING PROVIDER:   Mast, [name], NP [address],   [address] [phone]  RESPONSIBLE PARTY:    Contact Information    Name Relation Home Work [place] Son [phone]  [phone]       I met face to face with patient and family in the home. Nurse Baxter Flattery from [place] present for visit. Palliative Care was asked to follow this patient by consultation request of  Mast, [name], NP to address advance care planning and complex medical decision making. This is a follow up visit.                                   ASSESSMENT AND PLAN / RECOMMENDATIONS:   Advance Care Planning/Goals of Care: Goals include to maximize quality of life and symptom management. [place] remain [place] home. Our advance care planning conversation included a discussion about:     The value and importance of advance care planning   Experiences with loved ones who have been seriously ill or have died   Exploration of personal, cultural or spiritual beliefs that might influence medical decisions   Exploration of goals of care in the event of a sudden injury or illness    Review and updating or creation of an  advance directive document   CODE STATUS: DNR  Education provided on Palliative vs. Hospice services. Education on disease process. Palliative will continue to provide support. Will transition patient to hospice once she is no longer pursuing injections/blood transfusions.  Symptom Management/Plan:    MDS/MPN-patient with generalized weakness, marked physical decline in  past 2 weeks. Recommend walker for ambulation, w/c for locomotion when going longer distances. 24/7 caregivers to assist with adl care/needs. Patient currently receiving Retacrit injections weekly. She is considering stopping injections and transfusions. She is to follow up with Oncology on Friday. Continue routine lab work, injections and PRN blood transfusions for now. She is aware that if she stops transfusion/ijections, she will be eligible for hospice.  DME needs: gait belt to assist with transfers.   Decline in appetite-patient encouraged to eat foods she enjoys, nutritional supplements recommended. Private caregivers to assist with getting meals for patient.   LE edema-secondary to MDS. Patient encouraged to elevate legs while sitting in chair or in bed. Trial compression stockings/hose. Monitor salt intake.  Pain-patient c/o shoulder pain. Leg pain improved. Discussed transfer techniques. Recommend Tylenol 621m routinely BID, Tylenol #3 PRN; education on max daily limit of 3000 mg. E-script sent for refill to [facility] Continue gabapentin as directed.     Follow up Palliative Care Visit: Palliative care will continue to follow for complex medical decision making, advance care planning, and clarification of goals. Return in 2 weeks or prn.  I spent 95 minutes providing this consultation. More than 50% of the time in this consultation was spent in counseling and care coordination.   PPS: 50%  HOSPICE ELIGIBILITY/DIAGNOSIS: TBD  Chief Complaint: Palliative Medicine follow up visit; MDS.  HISTORY OF PRESENT  ILLNESS:  Jill Myers is a 87 y.o. 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   Patient reports improvement in leg/side pain. She does report some shoulder pain. Daughter Rise Paganini states patient became delirious with the tramadol. She is  taking the Tylenol #3 PRN. Daughter states patient did receive her Covid Booster last Monday. Diarrhea has improved; she stopped miralax. She reports LE edema.    History obtained from review of EMR, discussion with primary team, and interview with family, facility staff/caregiver and/or Jill Myers.  I reviewed available labs, medications, imaging, studies and related documents from the EMR.  Records reviewed and summarized above.   ROS   General: NAD EYES: denies vision changes ENMT: denies dysphagia Cardiovascular: denies chest pain, LE edema Pulmonary: denies cough, SOB with exertion Abdomen: endorses decline in appetite, diarrhea improved GU: denies dysuria, endorses continence of urine MSK: weakness,  no falls reported Skin: denies rashes or wounds Neurological: denies insomnia Psych: Endorses stable mood; tearful at times during visit Heme/lymph/immuno: denies bruises, abnormal bleeding  Physical Exam: Pulse: 80. resp 20, b/p 104/50 Constitutional: NAD General: frail appearing, thin EYES: anicteric sclera, lids intact, no discharge  ENMT: intact hearing, oral mucous membranes dry CV: S1S2, RRR, 2+ LE edema Pulmonary: LCTA, no increased work of breathing, no cough, room air Abdomen: normo-active BS + 4 quadrants, soft and non tender GU: deferred MSK: moves all extremities, ambulatory Skin: warm and dry, no rashes or wounds on visible skin Neuro: generalized weakness Psych: non-anxious affect, A and O x 3 Hem/lymph/immuno: no widespread bruising   Thank you for the opportunity to participate in the care of Jill Myers.  The palliative care team will continue to follow. Please call our office at [phone] if we can be of additional assistance.   Ezekiel Slocumb, NP   COVID-19 PATIENT SCREENING TOOL Asked and negative response unless otherwise noted:   Have you had symptoms of covid, tested positive or been in contact with someone with symptoms/positive test in the  past 5-10 days? No

## 2020-06-06 ENCOUNTER — Other Ambulatory Visit: Payer: Self-pay

## 2020-06-06 DIAGNOSIS — R531 Weakness: Secondary | ICD-10-CM | POA: Diagnosis not present

## 2020-06-06 DIAGNOSIS — D469 Myelodysplastic syndrome, unspecified: Secondary | ICD-10-CM

## 2020-06-06 DIAGNOSIS — C946 Myelodysplastic disease, not classified: Secondary | ICD-10-CM

## 2020-06-06 DIAGNOSIS — D649 Anemia, unspecified: Secondary | ICD-10-CM

## 2020-06-06 NOTE — Progress Notes (Signed)
    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  A walker alone will not resolve issue with performing activities of daily living. A wheelchair will allow patient to safely perform daily activities. Patient can safely propel the wheelchair in the home or has a caregiver who can provide assistance. Length of need 99 lifetime Accessories: elevating leg rests (ELRs), wheel locks, extensions and anti-tippers.   06/06/20 1008   06/06/20 0000  For home use only DME 4 wheeled rolling walker with seat       Question Answer Comment  Patient needs a walker to treat with the following condition Fatigue associated with anemia   Patient needs a walker to treat with the following condition Muscle weakness (generalized)      06/06/20 1008

## 2020-06-06 NOTE — Progress Notes (Signed)
[facility] [facility]   [phone] [phone]   Clinic Follow up Note   Patient Care Team: Mast, [name], NP as PCP - General (Internal Medicine) [name], MD as Consulting Physician (Dermatology) [name], MD as Consulting Physician (Sports Medicine) Jovita Kussmaul, MD as Consulting Physician (General Surgery) Raye Sorrow, MD as Consulting Physician (Pulmonary Disease) Truitt Merle, MD as Consulting Physician (Hematology) Gery Pray, MD as Consulting Physician (Radiation Oncology) Delice Bison, Charlestine Massed, NP as Nurse Practitioner (Hematology and Oncology) Annia Belt, MD as Consulting Physician (Oncology) Mauro Kaufmann, RN as Oncology Nurse Navigator Rockwell Germany, RN as Oncology Nurse Navigator  Date of Service:  06/08/2020  CHIEF COMPLAINT: f/u ofMPN/MDS  SUMMARY OF ONCOLOGIC HISTORY: Oncology History Overview Note  Cancer Staging Malignant neoplasm of upper-inner quadrant of right breast in female, estrogen receptor positive ([place]) Staging form: Breast, AJCC 8th Edition - Clinical stage from 01/07/2017: Stage IA (cT1c, cN0, cM0, G3, ER: Positive, PR: Positive, HER2: Negative) - Signed by Truitt Merle, MD on 01/20/2017  Malignant neoplasm of upper-outer quadrant of left breast in female, estrogen receptor positive ([place]) Staging form: Breast, AJCC 8th Edition - Clinical stage from 01/07/2017: Stage IA (cT1b, cN0, cM0, G2, ER: Positive, PR: Positive, HER2: Negative) - Signed by Truitt Merle, MD on 01/20/2017     Malignant neoplasm of upper-inner quadrant of right breast in female, estrogen receptor positive ([place])  01/01/2017 Mammogram   IMPRESSION: 1. 2.0 cm mass in the 2 o'clock position of the right breast with imaging features highly suspicious for malignancy. 2. 0.7 cm mass in the 2 o'clock position of the left breast with imaging features highly suspicious for malignancy. 3. No abnormal appearing axillary lymph nodes on  either side.   01/07/2017 Receptors her2   Right Breast: Prognostic indicators significant for: ER, 95% positive and PR, 50% positive, both with strong staining intensity. Proliferation marker Ki67 at 30. HER2 negative. RATIO OF HER2/CEP17 SIGNALS 0.81 AVERAGE HER2 COPY NUMBER PER CELL 1.70  Left Breast: Prognostic indicators significant for: ER, 100% positive and PR, 100% positive, both with strong staining intensity. Proliferation marker Ki67 at 10. HER2 negative. RATIO OF HER2/CEP17 SIGNALS 1.75 AVERAGE HER2 COPY NUMBER PER CELL 3.15   01/07/2017 Initial Biopsy   Diagnosis 1. Breast, right, needle core biopsy, 2:00 o'clock - INVASIVE DUCTAL CARCINOMA, SEE COMMENT. 2. Breast, left, needle core biopsy, 2:00 o'clock - INVASIVE MAMMARY CARCINOMA, SEE COMMENT.    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 She switched to Exemestane on 03/26/2018 but stopped after 1 week due to gluteal pain.    02/20/2017 Surgery   RIGHT BREAST LUMPECTOMY (Right) LEFT BREAST LUMPECTOMY WITH RADIOACTIVE SEED LOCALIZATION (Left)  Per Dr. Marlou Starks    02/20/2017 Pathology Results    Diagnosis 1. Breast, lumpectomy, Left w/ seed - INVASIVE DUCTAL CARCINOMA, GRADE I/III, SPANNING 1.1 CM. - THE SURGICAL RESECTION MARGINS ARE NEGATIVE FOR CARCINOMA. - SEE ONCOLOGY TABLE BELOW. 2. Breast, lumpectomy, Right - INVASIVE DUCTAL CARCINOMA, GRADE II/III, SPANNING 2.1 CM. - DUCTAL CARCINOMA IN SITU, INTERMEDIATE GRADE. - PERINEURAL INVASION IS IDENTIFIED. - THE SURGICAL RESECTION MARGINS ARE NEGATIVE FOR CARCINOMA. - SEE ONCOLOGY TABLE BELOW. 3. Breast, excision, Right inferior - LOBULAR NEOPLASIA (ATYPICAL LOBULAR HYPERPLASIA). - FAT NECROSIS. - SEE COMMENT.  Microscopic Comment 1.  BREAST, INVASIVE TUMOR (LEFT) Procedure: Seed localized lumpectomy Laterality: Left Tumor Size: 1.1 cm (gross measurement). Histologic Type: Ductal Grade: I Tubular Differentiation: 2 Nuclear Pleomorphism: 1 Mitotic Count: 1 Ductal Carcinoma in Situ (DCIS): Not identified Extent of Tumor: Confined to breast parenchyma. Margins: Greater than 0.2 cm to all margins. Regional Lymph Nodes: None examined. Breast Prognostic Profile: Case [id number] Estrogen Receptor: 100%, strong Progesterone Receptor: 100%, strong Her2: No amplification was detected. The ratio was 0.81 Ki-67: 10% Best tumor block for sendout testing: 1B Pathologic Stage Classification (pTNM, AJCC 8th Edition): Primary Tumor (pT): pT1c Regional Lymph Nodes (pN): pNX Distant Metastases (pM): pMX  2. BREAST, INVASIVE TUMOR (RIGHT) Procedure: Lumpectomy Laterality: Right Tumor Size: 2.1 cm (gross measurement) Histologic Type: Ductal Grade: II Tubular Differentiation: 3 Nuclear Pleomorphism: 2 Mitotic Count: 1 Ductal Carcinoma in Situ (DCIS): Present, intermediate grade Extent of Tumor: Confined to breast parenchyma. Margins: Greater than 0.2 cm to all margins Regional Lymph Nodes: None examined. Breast Prognostic Profile: Case [id number] Estrogen Receptor: 95%, strong Progesterone Receptor: 50%, strong Her2: No amplification was detected. The ratio was 0.81 Ki-67: 30% Best tumor block for sendout testing: 2A Pathologic Stage Classification (pTNM, AJCC 8th Edition): Primary Tumor (pT): pT2 Regional Lymph Nodes (pN): pNX Distant Metastases (pM): pMX 3. The surgical resection margin(s) of the specimen were inked and microscopically evaluated.   03/31/2017 - 05/11/2017 Radiation Therapy   1. Right Breast / 50 Gy in 25 fractions 2. Right Axilla / 45 Gy in 25 fractions 3. Right Breast Boost / 10 Gy in 5 fractions   Malignant neoplasm of upper-outer quadrant of left breast in female, estrogen receptor  positive ([place])  01/20/2017 Initial Diagnosis   Malignant neoplasm of upper-outer quadrant of left breast in female, estrogen receptor positive ([place])   02/20/2017 Surgery   RIGHT BREAST LUMPECTOMY (Right) LEFT BREAST LUMPECTOMY WITH RADIOACTIVE SEED LOCALIZATION (Left)  Per Dr. Marlou Starks     02/12/2018 Mammogram   IMPRESSION: No mammographic evidence of breast malignancy       CURRENT THERAPY:  Supportive care   INTERVAL HISTORY:  Jill Myers is here for a follow up of MPN/MDS. She was last seen by me 05/31/20. She presents to the clinic alone. Her son was called on phone to be included in the visit. Her care giver is here, but not present during visit. She notes her daughter who was helping her returned to [place] recently. She still lives at home and now has 24 hour care. She would like to stay at home although skilled nursing home has been offered. She notes she is able to use walker, but often uses wheelchair. She notes her strength is adequate and equal on both sides, but energy is low. She denies fever, chills or bleeding.  She notes she understands her prognosis is very poor. She notes she wants to die as soon as possible so she does not suffer further. Her son is traveling to be with her now. She is interested in proceeding with hospice home care.    REVIEW OF SYSTEMS:   Constitutional: Denies fevers, chills or abnormal weight loss Eyes: Denies blurriness of vision Ears, nose, mouth, throat, and face: Denies mucositis or sore throat Respiratory: Denies cough, dyspnea or wheezes Cardiovascular: Denies palpitation, chest discomfort or lower extremity swelling Gastrointestinal:  Denies nausea, heartburn or change in bowel habits Skin: Denies abnormal skin rashes Lymphatics: Denies new lymphadenopathy or easy bruising Neurological:Denies numbness, tingling or new weaknesses Behavioral/Psych: Mood is stable,  no new changes  All other systems were reviewed with the patient and  are negative.  MEDICAL HISTORY:  Past Medical History:  Diagnosis Date  . Bladder cystocele 05/21/2010  . Complication of anesthesia    deglutition in history  . Deglutition disorder 08/17/2012   Single episode of choking/aspiration; preceded by trouble swallowing saliva (thin liquids).  For SLP evaluation. Aug 17, 2012.    [name] Disease of blood and blood forming organ 01/05/2007   Qualifier: Diagnosis of  By: Oneida Alar MD, KARL    . Diverticulitis   . DIVERTICULOSIS OF COLON 03/05/2006   Qualifier: Diagnosis of  By: Samara Snide    . DIZZINESS 01/18/2010   Qualifier: Diagnosis of  By: Lindell Noe MD, Jeneen Rinks    . Essential hypertension, benign 02/22/2008   Qualifier: Diagnosis of  By: Lindell Noe MD, Jeneen Rinks     . Essential thrombocythemia ([place]) 01/31/11  . Family history of Alzheimer's disease 11/11/2013   Patient with family history of Alzheimers disease. She herself is very highly functional.  Plans for future visit with MMSE as primary function of the visit.    [name] GERD (gastroesophageal reflux disease) 08/16/2010  . HALLUX VALGUS, ACQUIRED 04/10/2009   Qualifier: Diagnosis of  By: [name] CMA,, [place]    . Hypertension   . HYPERTRIGLYCERIDEMIA 01/05/2007   Qualifier: Diagnosis of  By: Oneida Alar MD, KARL    . Insomnia 02/10/2014  . Left-sided chest wall pain 12/19/2011    L sided chest wall pain follows a dermatomal distribution and raises the possibility of thoracic radiculopathic pain, which would be expected to improve with gabapentin as well. We discussed this at length and she will call to make me aware if this does not get better. Rib belt/girdle in the meantime; may consider rib films or CXR if not improving.     . Metatarsalgia of right foot 11/28/2013   Referral to [facility]   . Osteopenia   . Personal history of radiation therapy    03/2017 right breast  . Restless leg syndrome 06/09/2011  . Restless legs syndrome (RLS)   . URINARY INCONTINENCE, URGE, MILD 11/27/2009   Qualifier: Diagnosis of  By: Zebedee Iba NP, Manuela Schwartz     . Vitamin D deficiency 08/16/2010    SURGICAL HISTORY: Past Surgical History:  Procedure Laterality Date  . BIOPSY BREAST Left 1998   Dr. Rebekah Chesterfield  . BREAST EXCISIONAL BIOPSY    . BREAST LUMPECTOMY Right 02/20/2017   Procedure: RIGHT BREAST LUMPECTOMY;  Surgeon: Jovita Kussmaul, MD;  Location: [facility];  Service: General;  Laterality: Right;  . BREAST LUMPECTOMY Left 02/20/2017   left lumpectomy  . BREAST LUMPECTOMY WITH RADIOACTIVE SEED LOCALIZATION Left 02/20/2017   Procedure: LEFT BREAST LUMPECTOMY WITH RADIOACTIVE SEED LOCALIZATION;  Surgeon: Jovita Kussmaul, MD;  Location: [facility];  Service: General;  Laterality: Left;  . CATARACT EXTRACTION  2010   Dr. [name]  . CHOLECYSTECTOMY  1992  . COLONOSCOPY  2008  . MOHS SURGERY  2012   on face [facility] [place]  . NAILBED REPAIR  12/05/2010   Procedure: NAILBED REPAIR;  Surgeon: Cammie Sickle., MD;  Location: [facility];  Service: Orthopedics;  Laterality: Right;  full thickness nail biopsy right hallux  . torn tenden  2012   hand Dr. Orie Rout    I have reviewed the social history and family history with the patient and they are unchanged from previous note.  ALLERGIES:  is allergic to doxycycline, macrodantin [  nitrofurantoin], and sulfamethoxazole-trimethoprim.  MEDICATIONS:  Current Outpatient Medications  Medication Sig Dispense Refill  . acetaminophen-codeine (TYLENOL #3) 300-30 MG tablet Take 1 tablet by mouth every 4 (four) hours as needed for moderate pain. 20 tablet 0  . aspirin 81 MG tablet Take 81 mg by mouth daily.    . calcium carbonate (OSCAL) 1500 (600 Ca) MG TABS tablet Take 600 mg of elemental calcium by mouth in the morning and at bedtime.     . cholecalciferol (VITAMIN D) 1000 UNITS tablet Take 2,000 Units by mouth daily.     . feeding supplement (ENSURE ENLIVE / ENSURE PLUS) LIQD Take 237 mLs by mouth 2 (two) times daily between meals. 14220 mL 0   . gabapentin (NEURONTIN) 300 MG capsule Take One Capsule (312m) by mouth every morning and Take Three capsules (9072m by mouth every evening. 360 capsule 1  . gabapentin (NEURONTIN) 300 MG capsule Take 1 capsule (300 mg total) by mouth daily at 12 noon. 30 capsule 0  . GLUCOSAMINE-CHONDROITIN PO Take 2 tablets by mouth daily. 1500-1200    . Magnesium 500 MG TABS Take 1 tablet by mouth daily.    . metoprolol tartrate (LOPRESSOR) 25 MG tablet Take 0.5 tablets (12.5 mg total) by mouth 2 (two) times daily. 90 tablet 1  . Multiple Vitamin (MULTIVITAMIN WITH MINERALS) TABS tablet Take 1 tablet by mouth daily. 30 tablet 0  . Omega-3 Fatty Acids (FISH OIL PO) Take 1 capsule by mouth daily.    . Marland Kitchenmeprazole (PRILOSEC) 20 MG capsule Take one capsule by mouth once daily. 90 capsule 1  . polyethylene glycol (MIRALAX / GLYCOLAX) packet Take 17 g by mouth daily.    . Marland KitchenyridOXINE (VITAMIN B-6) 100 MG tablet Take 100 mg by mouth daily.    . solifenacin (VESICARE) 10 MG tablet Take 1 tablet (10 mg total) by mouth daily. 90 tablet 1   No current facility-administered medications for this visit.    PHYSICAL EXAMINATION: ECOG PERFORMANCE STATUS: 3 - Symptomatic, >50% confined to bed  Vitals:   06/08/20 1037  BP: 94/71  Pulse: 73  Resp: 13  Temp: 98.8 F (37.1 C)  SpO2: 98%   Filed Weights   06/08/20 1037  Weight: 120 lb 12.8 oz (54.8 kg)    Due to COVID19 we will limit examination to appearance. Patient had no complaints.  GENERAL:alert, no distress and comfortable SKIN: skin color normal, no rashes or significant lesions EYES: normal, Conjunctiva are pink and non-injected, sclera clear  NEURO: alert & oriented x 3 with fluent speech   LABORATORY DATA:  I have reviewed the data as listed CBC Latest Ref Rng & Units 06/08/2020 05/31/2020 05/24/2020  WBC 4.0 - 10.5 K/uL 39.5(H) 26.4(H) 28.2(H)  Hemoglobin 12.0 - 15.0 g/dL 7.3(L) 8.3(L) 7.6(L)  Hematocrit 36.0 - 46.0 % 22.2(L) 25.7(L) 23.3(L)   Platelets 150 - 400 K/uL 65(L) 102(L) 150     CMP Latest Ref Rng & Units 05/12/2020 05/11/2020 05/10/2020  Glucose 70 - 99 mg/dL 130(H) 93 101(H)  BUN 8 - 23 mg/dL 12 12 17   Creatinine 0.44 - 1.00 mg/dL 0.91 0.63 0.70  Sodium 135 - 145 mmol/L 132(L) 129(L) 128(L)  Potassium 3.5 - 5.1 mmol/L 3.9 3.0(L) 3.5  Chloride 98 - 111 mmol/L 98 92(L) 94(L)  CO2 22 - 32 mmol/L 25 26 26   Calcium 8.9 - 10.3 mg/dL 8.7(L) 8.3(L) 8.4(L)  Total Protein 6.5 - 8.1 g/dL - - 6.2(L)  Total Bilirubin 0.3 - 1.2 mg/dL - -  0.5  Alkaline Phos 38 - 126 U/L - - 77  AST 15 - 41 U/L - - 31  ALT 0 - 44 U/L - - 30      RADIOGRAPHIC STUDIES: I have personally reviewed the radiological images as listed and agreed with the findings in the report. No results found.   ASSESSMENT & PLAN:  Laquenta Whitsell is a 87 y.o. 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 Bone marrow is significantly hypercellular (>90%).This is concerning for MPN evolving to preleukemia, such as MDS.Cytogenetics and FISH results showed multiple complex geneticabnormalities, predicts high risk MDSand poor prognosis(median survival less than 1 year). -I  previously discussed treatment options with patient, includingchemo with hypomethylation agents, such as Vidaza injections, orsupportive care with blood transfusion andEPO injection.Patient declined chemotherapy -She started EPO weekly on 05/11/20. She was switched to Retacrit from 05/17/20 per insurance coverage.  -She has had limited response so far to weekly EPO treatment despite dosing recement. She continues to have  pancytopenia. She requires blood transfusion on 05/29/20.  -Labs reviewed and continue to worsen. Today WBC increased to 39.5, Hg 7.3, plt 65K. She now has 8% blasts cells present which is increasing. I discussed her MDS is evolving to acute leukemia which is why she is not responding to retacrit.  -With worsening pancytopenia, her overall performance status is declining. I discussed the overall benefit of medical treatment is getting less and no longer beneficial. I discussed stopping treatment and blood transfusions at this point. She is agreeable. She notes understanding her poor prognosis at this time and is not interested in prolonging her life so she can suffers less. I recommend proceeding with hospice home care to help manage her quality of life. I reviewed services with her and she agreed to proceed. I will send referral.  -I will continue to be her attending when she is under hospice care   2. Bilateral breast cancer, invasive ductal carcinoma, right upper inner quadrant, pT2N0M0, stage IA, ER+/PR+/HER2-, G3, left upper-outer quadrant,pT1cN0M0, stage IA, ER+/PR+/HER2-, G2. S/p b/l lumpectomy, radiation and 1 month of AI.  3. Newly diagnosed left breast DCIS, high grade , ER-/PR- -Did not proceed with surgery due to her recent anemiaand MDS diagnosis and related overall poor prognosis   4. Goal of care discussion, DNR   -We again discussed the incurable nature of her cancer, and the overall poor prognosis, especially as her MDS is evolving into AML -The patient understands the goal of care is palliative, she declined chemo. At this point, medical treatments are no longer beneficial and I recommend stopping. She is agreeable.  -I recommend DNR/DNI, she agreed.  -She recently started 24 hour care at home. Given we will stop further medical treatment, she is eligible for hospice home care. She is agreeable, I will send referral.    Plan -No blood transfusion or Retacrit today, will  cancel future treatment and appointments.  -Send hospice home care referral  -F/u open, I will remain to be her attending when she is under hospice care  -I spoke with her son on the phone today.    No problem-specific Assessment & Plan notes found for  this encounter.   Orders Placed This Encounter  Procedures  . Ambulatory referral to Hospice    Referral Priority:   Urgent    Referral Type:   Consultation    Referral Reason:   Symptom Managment    Requested Specialty:   Hospice Services    Number of Visits Requested:   1   All questions were answered. The patient knows to call the clinic with any problems, questions or concerns. No barriers to learning was detected. The total time spent in the appointment was 30 minutes.     Truitt Merle, MD 06/08/2020   I, Joslyn Devon, am acting as scribe for Truitt Merle, MD.   I have reviewed the above documentation for accuracy and completeness, and I agree with the above.

## 2020-06-07 ENCOUNTER — Non-Acute Institutional Stay: Payer: Medicare PPO | Admitting: Nurse Practitioner

## 2020-06-07 ENCOUNTER — Encounter: Payer: Self-pay | Admitting: Nurse Practitioner

## 2020-06-07 ENCOUNTER — Other Ambulatory Visit: Payer: Self-pay

## 2020-06-07 DIAGNOSIS — D473 Essential (hemorrhagic) thrombocythemia: Secondary | ICD-10-CM | POA: Diagnosis not present

## 2020-06-07 DIAGNOSIS — D469 Myelodysplastic syndrome, unspecified: Secondary | ICD-10-CM | POA: Diagnosis not present

## 2020-06-07 DIAGNOSIS — D649 Anemia, unspecified: Secondary | ICD-10-CM

## 2020-06-07 DIAGNOSIS — M85851 Other specified disorders of bone density and structure, right thigh: Secondary | ICD-10-CM

## 2020-06-07 DIAGNOSIS — M7742 Metatarsalgia, left foot: Secondary | ICD-10-CM

## 2020-06-07 DIAGNOSIS — K219 Gastro-esophageal reflux disease without esophagitis: Secondary | ICD-10-CM

## 2020-06-07 DIAGNOSIS — I10 Essential (primary) hypertension: Secondary | ICD-10-CM

## 2020-06-07 DIAGNOSIS — E871 Hypo-osmolality and hyponatremia: Secondary | ICD-10-CM

## 2020-06-07 DIAGNOSIS — M159 Polyosteoarthritis, unspecified: Secondary | ICD-10-CM

## 2020-06-07 DIAGNOSIS — N3941 Urge incontinence: Secondary | ICD-10-CM

## 2020-06-07 DIAGNOSIS — C50211 Malignant neoplasm of upper-inner quadrant of right female breast: Secondary | ICD-10-CM

## 2020-06-07 DIAGNOSIS — M8949 Other hypertrophic osteoarthropathy, multiple sites: Secondary | ICD-10-CM

## 2020-06-07 DIAGNOSIS — K5909 Other constipation: Secondary | ICD-10-CM | POA: Diagnosis not present

## 2020-06-07 DIAGNOSIS — Z170 Estrogen receptor positive status [ER+]: Secondary | ICD-10-CM

## 2020-06-07 DIAGNOSIS — M7741 Metatarsalgia, right foot: Secondary | ICD-10-CM

## 2020-06-07 DIAGNOSIS — G2581 Restless legs syndrome: Secondary | ICD-10-CM

## 2020-06-07 DIAGNOSIS — R911 Solitary pulmonary nodule: Secondary | ICD-10-CM

## 2020-06-07 NOTE — Assessment & Plan Note (Signed)
stable, on Gabapentin 300mg  qam 900mg  qpm.

## 2020-06-07 NOTE — Assessment & Plan Note (Signed)
stable, on Vesicare 10mg  qd.

## 2020-06-07 NOTE — Assessment & Plan Note (Signed)
9 mm lobulated nodular density in the right upper lobe. Consider one of the following in 3 months, desires f/u oncology

## 2020-06-07 NOTE — Progress Notes (Signed)
Location:   clinic [place]   Place of Service:  Clinic (12) Provider: Marlana Latus NP  Code Status: DNR Goals of Care: [place] 06/07/2020  Does Patient Have a Medical Advance Directive? Yes  Type of Paramedic of [place];Living will  Does patient want to make changes to medical advance directive? No - Patient declined  Copy of [place] in Chart? Yes - validated most recent copy scanned in chart (See row information)  Would patient like information on creating a medical advance directive? -     Chief Complaint  Patient presents with  . Medical Management of Chronic Issues    1 week follow up     HPI: Patient is a 87 y.o. female seen today for medical management of chronic diseases.      Hyponatremia/Na 128-improved, Na 132 05/12/20             Bibasilar atelectasis with small right-sided pleural effusion.              9 mm lobulated nodular density in the right upper lobe. Consider one of the following in 3 months, desires f/u oncology             dc'd HCTZ/K in hospital.  Prepatellar bursitis of the right knee, chronic. Takes prn Ibuprofen. The R+L ball part feet pain when walking, not new, Hx of Metatarsalgia, Morton's neuroma Bilateral breastcancer, s/p lumpectomy, radiation, offAdjuvant, f/u oncology.  HTN, Metoprolol 12.5mg  bid, Bun/creat 12/0.91 05/12/20 Hx of RLS, stable, on Gabapentin 300mg  qam 900mg  qpm. Essential thrombocythemia stable, off hydroxyurea,, plt176 05/17/20 Anemia, Hgb 8.0 05/17/20  f/u oncology, on Erythropoietin GERD, stable, on Omeprazole.   Constipation, loose stools resolved after dc'd MiraLax qd, Colace qd.  Urinary frequency, stable, on Vesicare 10mg  qd.  Lower back pain, radiculopathy R, Ortho 04/28/19,Tylenol 3 Gabapentin,Vit  B6 Osteopenia, takes Zometa.  MDS/MPN Wbc 26.3 05/17/20, f/u Oncology   Past Medical History:  Diagnosis Date  . Bladder cystocele 05/21/2010  . Complication of anesthesia    deglutition in history  . Deglutition disorder 08/17/2012   Single episode of choking/aspiration; preceded by trouble swallowing saliva (thin liquids).  For SLP evaluation. Aug 17, 2012.    [name] Disease of blood and blood forming organ 01/05/2007   Qualifier: Diagnosis of  By: Oneida Alar MD, KARL    . Diverticulitis   . DIVERTICULOSIS OF COLON 03/05/2006   Qualifier: Diagnosis of  By: Samara Snide    . DIZZINESS 01/18/2010   Qualifier: Diagnosis of  By: Lindell Noe MD, Jeneen Rinks    . Essential hypertension, benign 02/22/2008   Qualifier: Diagnosis of  By: Lindell Noe MD, Jeneen Rinks     . Essential thrombocythemia ([place]) 01/31/11  . Family history of Alzheimer's disease 11/11/2013   Patient with family history of Alzheimers disease. She herself is very highly functional.  Plans for future visit with MMSE as primary function of the visit.    [name] GERD (gastroesophageal reflux disease) 08/16/2010  . HALLUX VALGUS, ACQUIRED 04/10/2009   Qualifier: Diagnosis of  By: [name] CMA,, [place]    . Hypertension   . HYPERTRIGLYCERIDEMIA 01/05/2007   Qualifier: Diagnosis of  By: Oneida Alar MD, KARL    . Insomnia 02/10/2014  . Left-sided chest wall pain 12/19/2011    L sided chest wall pain follows a dermatomal distribution and raises the possibility of thoracic radiculopathic pain, which would be expected to improve with gabapentin as well. We discussed this at length and she will call  to make me aware if this does not get better. Rib belt/girdle in the meantime; may consider rib films or CXR if not improving.     . Metatarsalgia of right foot 11/28/2013   Referral to [facility]   . Osteopenia   . Personal history of radiation therapy    03/2017 right breast  . Restless leg syndrome 06/09/2011  . Restless legs syndrome (RLS)   . URINARY INCONTINENCE, URGE, MILD  11/27/2009   Qualifier: Diagnosis of  By: Zebedee Iba NP, Manuela Schwartz    . Vitamin D deficiency 08/16/2010    Past Surgical History:  Procedure Laterality Date  . BIOPSY BREAST Left 1998   Dr. Rebekah Chesterfield  . BREAST EXCISIONAL BIOPSY    . BREAST LUMPECTOMY Right 02/20/2017   Procedure: RIGHT BREAST LUMPECTOMY;  Surgeon: Jovita Kussmaul, MD;  Location: [place];  Service: General;  Laterality: Right;  . BREAST LUMPECTOMY Left 02/20/2017   left lumpectomy  . BREAST LUMPECTOMY WITH RADIOACTIVE SEED LOCALIZATION Left 02/20/2017   Procedure: LEFT BREAST LUMPECTOMY WITH RADIOACTIVE SEED LOCALIZATION;  Surgeon: Jovita Kussmaul, MD;  Location: [place];  Service: General;  Laterality: Left;  . CATARACT EXTRACTION  2010   Dr. [name]  . CHOLECYSTECTOMY  1992  . COLONOSCOPY  2008  . MOHS SURGERY  2012   on face [facility] [place]  . NAILBED REPAIR  12/05/2010   Procedure: NAILBED REPAIR;  Surgeon: Cammie Sickle., MD;  Location: [place];  Service: Orthopedics;  Laterality: Right;  full thickness nail biopsy right hallux  . torn tenden  2012   hand Dr. Orie Rout    Allergies  Allergen Reactions  . Doxycycline Nausea And Vomiting  . Macrodantin [Nitrofurantoin] Nausea And Vomiting  . Sulfamethoxazole-Trimethoprim Rash    REACTION: Rash after completing course of Septra    Allergies as of 06/07/2020      Reactions   Doxycycline Nausea And Vomiting   Macrodantin [nitrofurantoin] Nausea And Vomiting   Sulfamethoxazole-trimethoprim Rash   REACTION: Rash after completing course of Septra      Medication List       Accurate as of June 07, 2020 11:59 PM. If you have any questions, ask your nurse or doctor.        STOP taking these medications   ibuprofen 200 MG tablet Commonly known as: ADVIL Stopped by: Canna Nickelson X Bayley Hurn, NP   traMADol 50 MG tablet Commonly known as: ULTRAM Stopped by: Naftali Carchi X Dorri Ozturk, NP     TAKE these medications    acetaminophen-codeine 300-30 MG tablet Commonly known as: TYLENOL #3 Take 1 tablet by mouth every 4 (four) hours as needed for moderate pain.   aspirin 81 MG tablet Take 81 mg by mouth daily.   calcium carbonate 1500 (600 Ca) MG Tabs tablet Commonly known as: OSCAL Take 600 mg of elemental calcium by mouth in the morning and at bedtime.   cholecalciferol 1000 units tablet Commonly known as: VITAMIN D Take 2,000 Units by mouth daily.   feeding supplement Liqd Take 237 mLs by mouth 2 (two) times daily between meals.   FISH OIL PO Take 1 capsule by mouth daily.   gabapentin 300 MG capsule Commonly known as: NEURONTIN Take One Capsule (300mg ) by mouth every morning and Take Three capsules (900mg ) by mouth every evening.   gabapentin 300 MG capsule Commonly known as: NEURONTIN Take 1 capsule (300 mg total) by mouth daily at 12 noon.   GLUCOSAMINE-CHONDROITIN PO Take  2 tablets by mouth daily. 1500-1200   Magnesium 500 MG Tabs Take 1 tablet by mouth daily.   metoprolol tartrate 25 MG tablet Commonly known as: LOPRESSOR Take 0.5 tablets (12.5 mg total) by mouth 2 (two) times daily.   multivitamin with minerals Tabs tablet Take 1 tablet by mouth daily.   omeprazole 20 MG capsule Commonly known as: PRILOSEC Take one capsule by mouth once daily.   polyethylene glycol 17 g packet Commonly known as: MIRALAX / GLYCOLAX Take 17 g by mouth daily.   pyridOXINE 100 MG tablet Commonly known as: VITAMIN B-6 Take 100 mg by mouth daily.   solifenacin 10 MG tablet Commonly known as: VESICARE Take 1 tablet (10 mg total) by mouth daily.       Review of Systems:  Review of Systems  Constitutional: Positive for activity change, appetite change and fatigue. Negative for fever.       Decreased appetite  HENT: Positive for hearing loss. Negative for congestion, postnasal drip and voice change.        C/o dry mouth  Eyes: Negative for visual disturbance.  Respiratory: Positive  for cough. Negative for shortness of breath.        Chronic cough  Cardiovascular: Positive for leg swelling. Negative for chest pain and palpitations.  Gastrointestinal: Positive for diarrhea. Negative for abdominal pain and constipation.       Resolved diarrhea after MiraLax dc'd  Genitourinary: Positive for frequency. Negative for dysuria and urgency.  Musculoskeletal: Positive for arthralgias, back pain, gait problem and joint swelling.       R+L foot MTJ pain, top of feet pain  chronic. Right lower back, right hip, right leg pain is chronic, positional improved. Right knee pain/prepetellar effusion-s/p aspiration x2  Skin: Positive for pallor. Negative for color change.  Neurological: Negative for speech difficulty, weakness and light-headedness.  Psychiatric/Behavioral: Negative for behavioral problems and sleep disturbance. The patient is not nervous/anxious.     Health Maintenance  Topic Date Due  . Pneumococcal Vaccine 65-34 Years old (1 of 4 - PCV13) Never done  . TETANUS/TDAP  04/11/2019  . INFLUENZA VACCINE  08/06/2020  . DEXA SCAN  Completed  . COVID-19 Vaccine  Completed  . PNA vac Low Risk Adult  Completed  . Zoster Vaccines- Shingrix  Completed  . HPV VACCINES  Aged Out    Physical Exam: Vitals:   06/07/20 1438  BP: (!) 116/56  Pulse: 83  Resp: 18  Temp: 98.2 F (36.8 C)  SpO2: 91%  Weight: 120 lb (54.4 kg)  Height: 5\' 3"  (1.6 m)   Body mass index is 21.26 kg/m. Physical Exam Vitals and nursing note reviewed.  Constitutional:      Comments: weaker  HENT:     Head: Normocephalic and atraumatic.     Mouth/Throat:     Mouth: Mucous membranes are dry.  Eyes:     Extraocular Movements: Extraocular movements intact.     Conjunctiva/sclera: Conjunctivae normal.     Pupils: Pupils are equal, round, and reactive to light.  Cardiovascular:     Rate and Rhythm: Normal rate and regular rhythm.     Heart sounds: Murmur heard.    Pulmonary:     Effort:  Pulmonary effort is normal.     Breath sounds: No rales.  Abdominal:     General: Bowel sounds are normal.     Palpations: Abdomen is soft.     Tenderness: There is no abdominal tenderness. There is no right CVA  tenderness, left CVA tenderness, guarding or rebound.  Musculoskeletal:        General: Swelling and tenderness present.     Cervical back: Normal range of motion and neck supple.     Right lower leg: Edema present.     Left lower leg: Edema present.     Comments: 1+ edema BLE. Lower back pain, radiculopathy R, Ortho 04/28/19. Right knee pain/prepetellar effusion-s/p aspiration x2   Skin:    General: Skin is warm and dry.     Coloration: Skin is pale.  Neurological:     General: No focal deficit present.     Mental Status: She is alert and oriented to person, place, and time. Mental status is at baseline.     Motor: No weakness.     Coordination: Coordination normal.     Gait: Gait abnormal.     Comments: Uses w/c for mobility due to generalized weakness.   Psychiatric:        Mood and Affect: Mood normal.        Behavior: Behavior normal.        Thought Content: Thought content normal.        Judgment: Judgment normal.     Labs reviewed: Basic Metabolic Panel: Recent Labs    05/10/20 0906 05/11/20 0452 05/12/20 0809  NA 128* 129* 132*  K 3.5 3.0* 3.9  CL 94* 92* 98  CO2 26 26 25   GLUCOSE 101* 93 130*  BUN 17 12 12   CREATININE 0.70 0.63 0.91  CALCIUM 8.4* 8.3* 8.7*  MG  --  1.8  --   PHOS  --  3.8  --    Liver Function Tests: Recent Labs    02/22/20 1000 04/12/20 0837 05/10/20 0906  AST 22 30 31   ALT 31 31 30   ALKPHOS 42 65 77  BILITOT 0.7 0.8 0.5  PROT 6.4* 6.2* 6.2*  ALBUMIN 3.8 3.8 3.7   No results for input(s): LIPASE, AMYLASE in the last 8760 hours. No results for input(s): AMMONIA in the last 8760 hours. CBC: Recent Labs    05/24/20 0944 05/31/20 0905 06/08/20 0954  WBC 28.2* 26.4* 39.5*  NEUTROABS 9.6* 10.8* 17.8*  HGB 7.6* 8.3*  7.3*  HCT 23.3* 25.7* 22.2*  MCV 107.9* 104.5* 101.8*  PLT 150 102* 65*   Lipid Panel: No results for input(s): CHOL, HDL, LDLCALC, TRIG, CHOLHDL, LDLDIRECT in the last 8760 hours. No results found for: HGBA1C  Procedures since last visit: DG Ribs Unilateral W/Chest Right  Result Date: 05/10/2020 CLINICAL DATA:  right rib/chest pain EXAM: RIGHT RIBS AND CHEST - 3+ VIEW COMPARISON:  None. FINDINGS: No evidence of displaced rib fracture. There is no evidence of pneumothorax. There is a small right pleural effusion. Both lungs are clear. Heart size and mediastinal contours are within normal limits. IMPRESSION: Small right pleural effusion. No evidence of displaced rib fracture. Electronically Signed   By: Maurine Simmering   On: 05/10/2020 09:40   CT Angio Chest PE W/Cm &/Or Wo Cm  Result Date: 05/10/2020 CLINICAL DATA:  Right-sided chest pain for several days, initial encounter EXAM: CT ANGIOGRAPHY CHEST WITH CONTRAST TECHNIQUE: Multidetector CT imaging of the chest was performed using the standard protocol during bolus administration of intravenous contrast. Multiplanar CT image reconstructions and MIPs were obtained to evaluate the vascular anatomy. CONTRAST:  18mL OMNIPAQUE IOHEXOL 350 MG/ML SOLN COMPARISON:  Chest x-ray from earlier in the same day. FINDINGS: Cardiovascular: Thoracic aorta demonstrates atherosclerotic calcifications without aneurysmal dilatation. No  significant enhancement is noted to rule out dissection. Heart is at the upper limits of normal in size. The pulmonary artery shows a normal branching pattern without definitive filling defect to suggest pulmonary embolism. Mediastinum/Nodes: Thoracic inlet is within normal limits. No sizable hilar or mediastinal adenopathy is noted. The esophagus is within normal limits. Lungs/Pleura: Bibasilar atelectatic changes are noted right greater than left with associated right-sided pleural effusion. Somewhat lobulated nodular density is noted  measuring 9 mm in the anterior aspect of the right upper lobe best seen on image number 46 of series 6. No other sizable nodules are seen. Upper Abdomen: Scattered prominent cysts are noted in the central portion of the liver measuring up to 3.8 cm. Musculoskeletal: No chest wall abnormality. No acute or significant osseous findings. Review of the MIP images confirms the above findings. IMPRESSION: No evidence of pulmonary emboli. Bibasilar atelectasis with small right-sided pleural effusion. 9 mm lobulated nodular density in the right upper lobe. Consider one of the following in 3 months for both low-risk and high-risk individuals: (a) repeat chest CT, (b) follow-up PET-CT, or (c) tissue sampling. This recommendation follows the consensus statement: Guidelines for Management of Incidental Pulmonary Nodules Detected on CT Images: From the Fleischner Society 2017; Radiology 2017; 284:228-243. Aortic Atherosclerosis (ICD10-I70.0). Electronically Signed   By: Inez Catalina M.D.   On: 05/10/2020 12:32    Assessment/Plan  Chronic constipation loose stools resolved after dc'd MiraLax qd, Colace qd.    URINARY INCONTINENCE, URGE, MILD stable, on Vesicare 10mg  qd.   Osteoarthritis, multiple sites radiculopathy R, Ortho 04/28/19,Ibuprofen, Gabapentin,Vit B6  Prepatellar bursitis of the right knee, chronic. Takes prn Ibuprofen.  Osteopenia  takes Zometa.   MDS/MPN (myelodysplastic/myeloproliferative neoplasms) ([place]) MDS/MPN Wbc 26.3 05/17/20, f/u Oncology 06/08/20-may be growth factor, may consider enrolling in Hospice Service.    GERD (gastroesophageal reflux disease) stable, on Omeprazole.   Symptomatic anemia Hgb 8.0 05/17/20  f/u oncology, on Erythropoietin  Essential thrombocythemia ([place]) stable, off hydroxyurea,, plt176 05/17/20   Restless leg syndrome stable, on Gabapentin 300mg  qam 900mg  qpm.   Hypertension Blood pressure is controlled, Metoprolol 12.5mg   bid, Bun/creat 12/0.91 05/12/20  Malignant neoplasm of upper-inner quadrant of right breast in female, estrogen receptor positive ([place]) s/p lumpectomy, radiation, offAdjuvant, f/u oncology.    Metatarsalgia of both feet The R+L ball part feet pain when walking, not new, Hx of Metatarsalgia, Morton's neuroma  Hyponatremia Hyponatremia/Na 128-improved, Na 132 05/12/20, dc'd HCTZ/K in hospital.    Nodule of upper lobe of right lung 9 mm lobulated nodular density in the right upper lobe. Consider one of the following in 3 months, desires f/u oncology    Labs/tests ordered:  None  Plan of care reviewed with the patient and the patient's daughter  Next appt:  2 weeks

## 2020-06-07 NOTE — Assessment & Plan Note (Signed)
stable, off hydroxyurea,, plt176 05/17/20

## 2020-06-07 NOTE — Assessment & Plan Note (Signed)
takes Zometa.

## 2020-06-07 NOTE — Assessment & Plan Note (Addendum)
MDS/MPN Wbc 26.3 05/17/20, f/u Oncology 06/08/20-may be growth factor, may consider enrolling in Hospice Service.

## 2020-06-07 NOTE — Assessment & Plan Note (Signed)
loose stools resolved after dc'd MiraLax qd, Colace qd.

## 2020-06-07 NOTE — Assessment & Plan Note (Signed)
Hyponatremia/Na 128-improved, Na 132 05/12/20, dc'd HCTZ/K in hospital.

## 2020-06-07 NOTE — Assessment & Plan Note (Addendum)
radiculopathy R, Ortho 04/28/19,Ibuprofen, Gabapentin,Vit B6  Prepatellar bursitis of the right knee, chronic. Takes prn Ibuprofen.

## 2020-06-07 NOTE — Assessment & Plan Note (Signed)
s/p lumpectomy, radiation, offAdjuvant, f/u oncology.

## 2020-06-07 NOTE — Assessment & Plan Note (Signed)
Blood pressure is controlled, Metoprolol 12.5mg  bid, Bun/creat 12/0.91 05/12/20

## 2020-06-07 NOTE — Assessment & Plan Note (Signed)
Hgb 8.0 05/17/20  f/u oncology, on Erythropoietin

## 2020-06-07 NOTE — Assessment & Plan Note (Signed)
stable, on Omeprazole.  

## 2020-06-07 NOTE — Assessment & Plan Note (Signed)
The R+L ball part feet pain when walking, not new, Hx of Metatarsalgia, Morton's neuroma

## 2020-06-07 NOTE — Progress Notes (Signed)
Faxed completed Attending Physician's statement to [facility] at [phone] per family's request.

## 2020-06-08 ENCOUNTER — Encounter: Payer: Self-pay | Admitting: Hematology

## 2020-06-08 ENCOUNTER — Inpatient Hospital Stay: Payer: Medicare PPO

## 2020-06-08 ENCOUNTER — Inpatient Hospital Stay: Payer: Medicare PPO | Attending: Hematology

## 2020-06-08 ENCOUNTER — Encounter: Payer: Self-pay | Admitting: Nurse Practitioner

## 2020-06-08 ENCOUNTER — Ambulatory Visit: Payer: Medicare PPO | Admitting: Hematology

## 2020-06-08 ENCOUNTER — Other Ambulatory Visit: Payer: Medicare PPO

## 2020-06-08 ENCOUNTER — Inpatient Hospital Stay (HOSPITAL_BASED_OUTPATIENT_CLINIC_OR_DEPARTMENT_OTHER): Payer: Medicare PPO | Admitting: Hematology

## 2020-06-08 VITALS — BP 94/71 | HR 73 | Temp 98.8°F | Resp 13 | Ht 63.0 in | Wt 120.8 lb

## 2020-06-08 DIAGNOSIS — Z7982 Long term (current) use of aspirin: Secondary | ICD-10-CM | POA: Diagnosis not present

## 2020-06-08 DIAGNOSIS — Z79899 Other long term (current) drug therapy: Secondary | ICD-10-CM | POA: Insufficient documentation

## 2020-06-08 DIAGNOSIS — Z170 Estrogen receptor positive status [ER+]: Secondary | ICD-10-CM

## 2020-06-08 DIAGNOSIS — Z171 Estrogen receptor negative status [ER-]: Secondary | ICD-10-CM | POA: Insufficient documentation

## 2020-06-08 DIAGNOSIS — D649 Anemia, unspecified: Secondary | ICD-10-CM

## 2020-06-08 DIAGNOSIS — D469 Myelodysplastic syndrome, unspecified: Secondary | ICD-10-CM | POA: Insufficient documentation

## 2020-06-08 DIAGNOSIS — D0512 Intraductal carcinoma in situ of left breast: Secondary | ICD-10-CM | POA: Diagnosis not present

## 2020-06-08 DIAGNOSIS — M8580 Other specified disorders of bone density and structure, unspecified site: Secondary | ICD-10-CM | POA: Insufficient documentation

## 2020-06-08 DIAGNOSIS — C50211 Malignant neoplasm of upper-inner quadrant of right female breast: Secondary | ICD-10-CM

## 2020-06-08 DIAGNOSIS — D61818 Other pancytopenia: Secondary | ICD-10-CM | POA: Insufficient documentation

## 2020-06-08 DIAGNOSIS — I10 Essential (primary) hypertension: Secondary | ICD-10-CM | POA: Insufficient documentation

## 2020-06-08 DIAGNOSIS — D473 Essential (hemorrhagic) thrombocythemia: Secondary | ICD-10-CM | POA: Diagnosis not present

## 2020-06-08 DIAGNOSIS — Z853 Personal history of malignant neoplasm of breast: Secondary | ICD-10-CM | POA: Diagnosis not present

## 2020-06-08 DIAGNOSIS — Z9223 Personal history of estrogen therapy: Secondary | ICD-10-CM | POA: Diagnosis not present

## 2020-06-08 LAB — CBC WITH DIFFERENTIAL (CANCER CENTER ONLY)
Abs Immature Granulocytes: 8.7 10*3/uL — ABNORMAL HIGH (ref 0.00–0.07)
Band Neutrophils: 19 %
Basophils Absolute: 0 10*3/uL (ref 0.0–0.1)
Basophils Relative: 0 %
Blasts: 8 %
Eosinophils Absolute: 0.8 10*3/uL — ABNORMAL HIGH (ref 0.0–0.5)
Eosinophils Relative: 2 %
HCT: 22.2 % — ABNORMAL LOW (ref 36.0–46.0)
Hemoglobin: 7.3 g/dL — ABNORMAL LOW (ref 12.0–15.0)
Lymphocytes Relative: 13 %
Lymphs Abs: 5.1 10*3/uL — ABNORMAL HIGH (ref 0.7–4.0)
MCH: 33.5 pg (ref 26.0–34.0)
MCHC: 32.9 g/dL (ref 30.0–36.0)
MCV: 101.8 fL — ABNORMAL HIGH (ref 80.0–100.0)
Metamyelocytes Relative: 12 %
Monocytes Absolute: 4 10*3/uL — ABNORMAL HIGH (ref 0.1–1.0)
Monocytes Relative: 10 %
Myelocytes: 10 %
Neutro Abs: 17.8 10*3/uL — ABNORMAL HIGH (ref 1.7–7.7)
Neutrophils Relative %: 26 %
Platelet Count: 65 10*3/uL — ABNORMAL LOW (ref 150–400)
RBC: 2.18 MIL/uL — ABNORMAL LOW (ref 3.87–5.11)
RDW: 21.4 % — ABNORMAL HIGH (ref 11.5–15.5)
WBC Count: 39.5 10*3/uL — ABNORMAL HIGH (ref 4.0–10.5)
nRBC: 1.2 % — ABNORMAL HIGH (ref 0.0–0.2)

## 2020-06-08 LAB — SAMPLE TO BLOOD BANK

## 2020-06-08 NOTE — Progress Notes (Signed)
Referral for hospice faxed to [facility]

## 2020-06-14 ENCOUNTER — Other Ambulatory Visit: Payer: Medicare PPO

## 2020-06-14 ENCOUNTER — Encounter: Payer: Self-pay | Admitting: Nutrition

## 2020-06-14 ENCOUNTER — Encounter: Payer: Medicare PPO | Admitting: Nutrition

## 2020-06-14 ENCOUNTER — Ambulatory Visit: Payer: Medicare PPO

## 2020-06-14 NOTE — Progress Notes (Signed)
Patient did not show up for nutrition appointment. 

## 2020-07-06 DEATH — deceased

## 2020-07-25 ENCOUNTER — Encounter: Payer: Self-pay | Admitting: Family Medicine

## 2020-07-27 ENCOUNTER — Other Ambulatory Visit: Payer: Medicare PPO

## 2020-08-09 ENCOUNTER — Encounter: Payer: Self-pay | Admitting: Nurse Practitioner

## 2023-05-03 IMAGING — CT CT ANGIO CHEST
2 of 7 series · 17 of 46 positions shown · IV contrast (APPLIED)
Comparison: Chest x-ray from earlier in the same day.

CLINICAL DATA: Right-sided chest pain for several days, initial
encounter

EXAM:
CT ANGIOGRAPHY CHEST WITH CONTRAST
TECHNIQUE: Multidetector CT imaging of the chest was performed using the
standard protocol during bolus administration of intravenous
contrast. Multiplanar CT image reconstructions and MIPs were
obtained to evaluate the vascular anatomy.
CONTRAST:  75mL OMNIPAQUE IOHEXOL 350 MG/ML SOLN

[Series 5: thins · axial · 0.63mm/px · z∈[+1326,+1578]mm · 15 of 288 slices shown]
[im 18/288  lung]
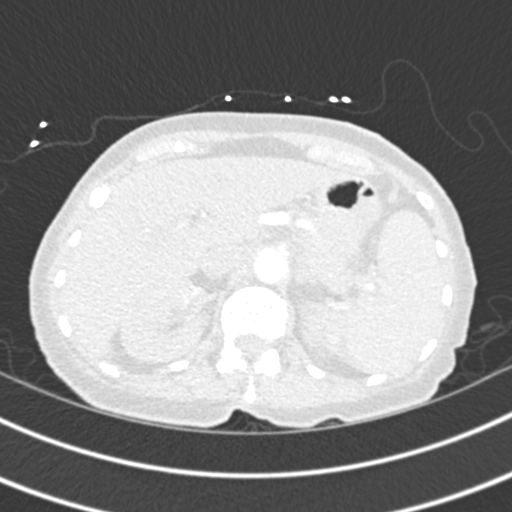
[im 36/288  soft-tissue]
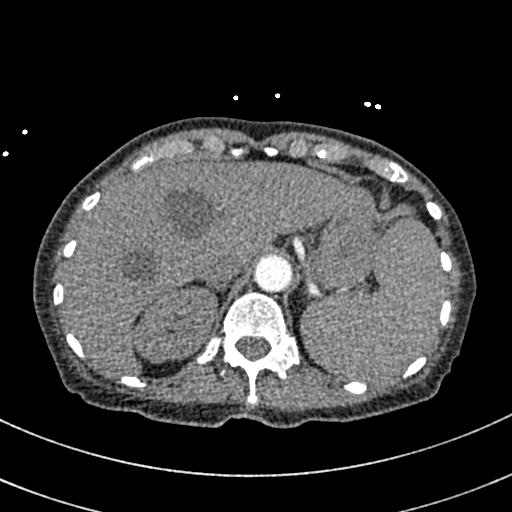
[im 54/288  lung]
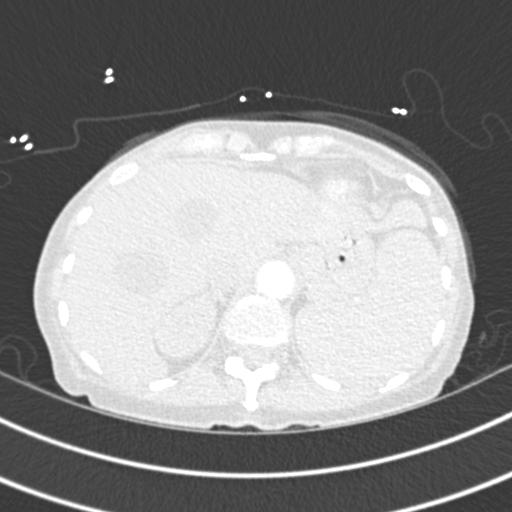
[im 72/288  soft-tissue]
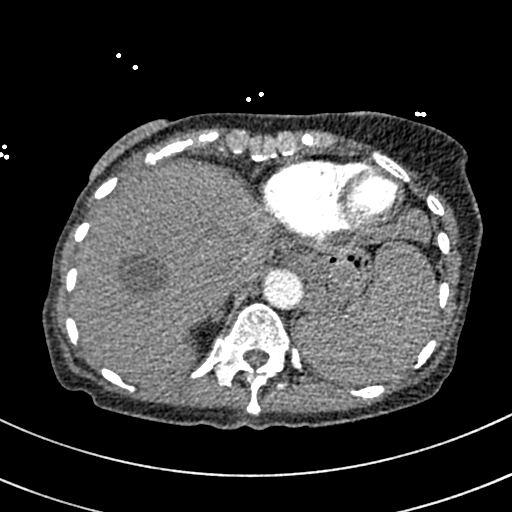
[im 90/288  lung]
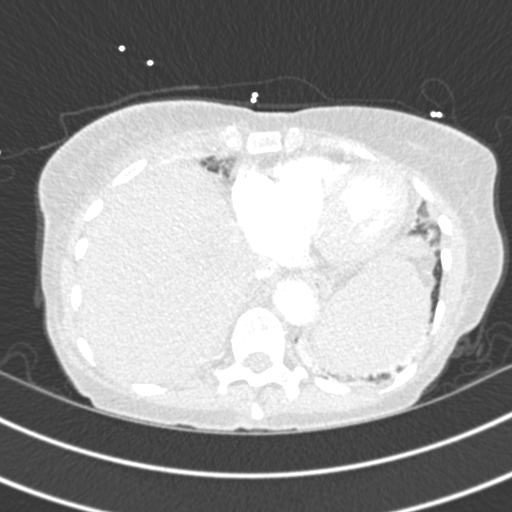
[im 108/288  soft-tissue]
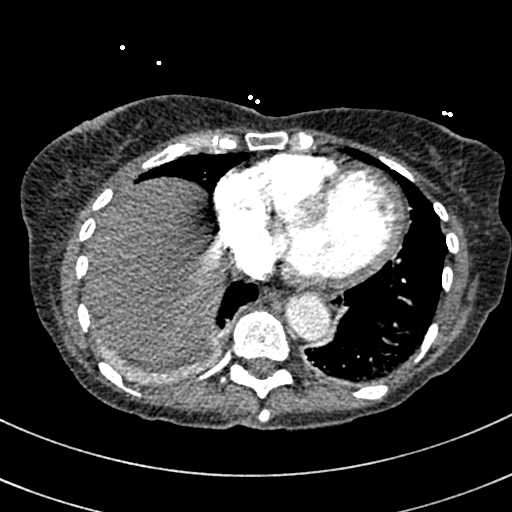
[im 126/288  lung]
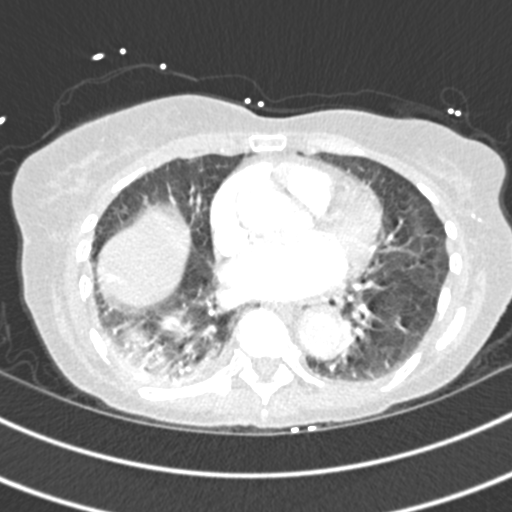
[im 144/288  soft-tissue]
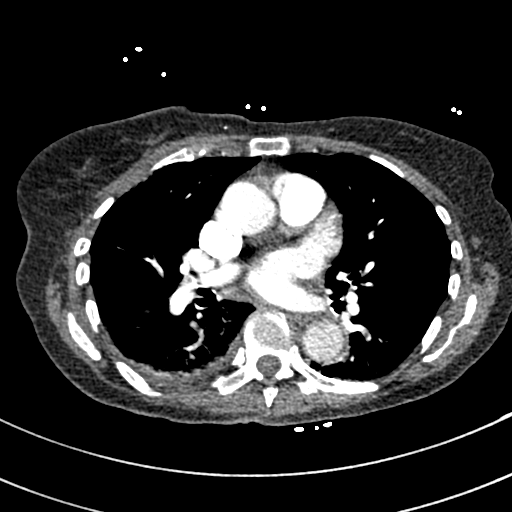
[im 162/288  lung]
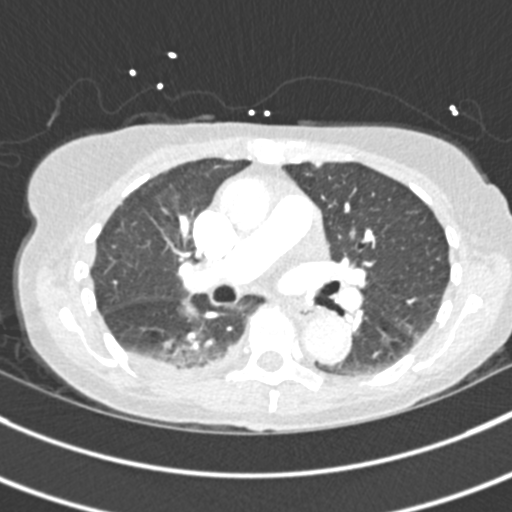
[im 180/288  soft-tissue]
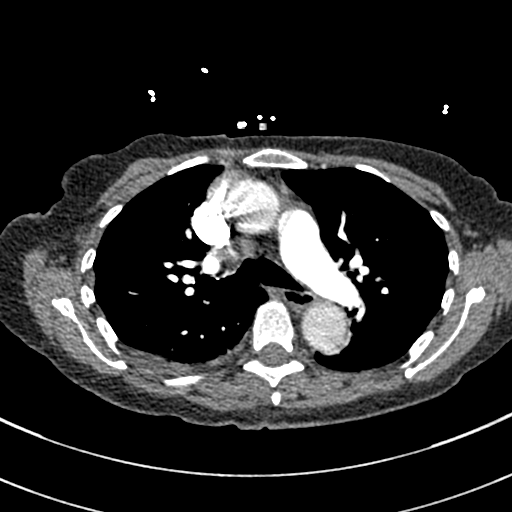
[im 198/288  lung]
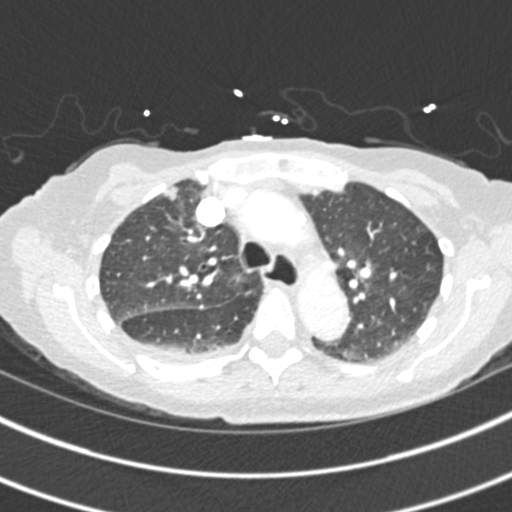
[im 216/288  soft-tissue]
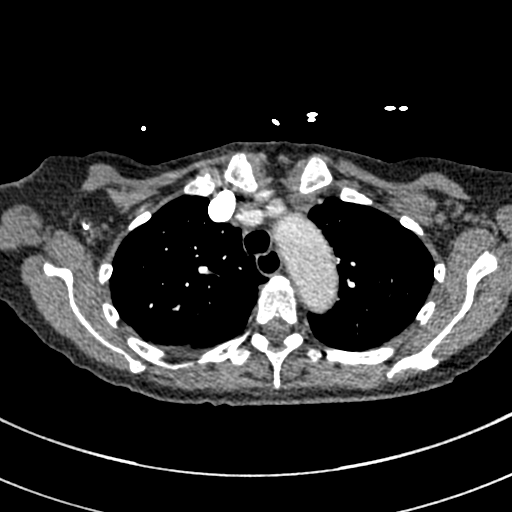
[im 234/288  lung]
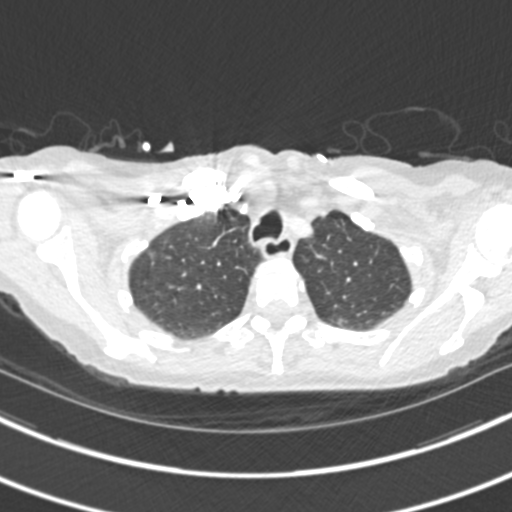
[im 252/288  soft-tissue]
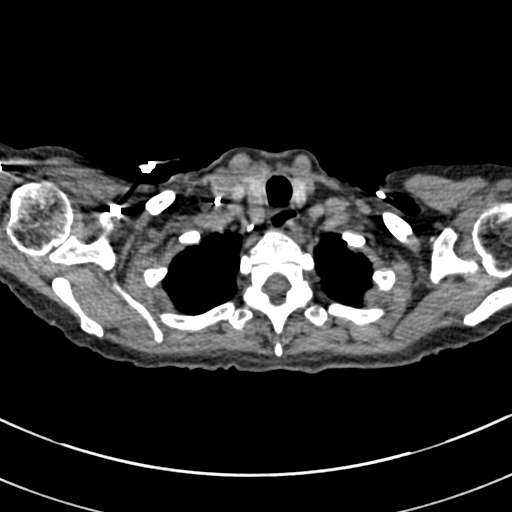
[im 270/288  lung]
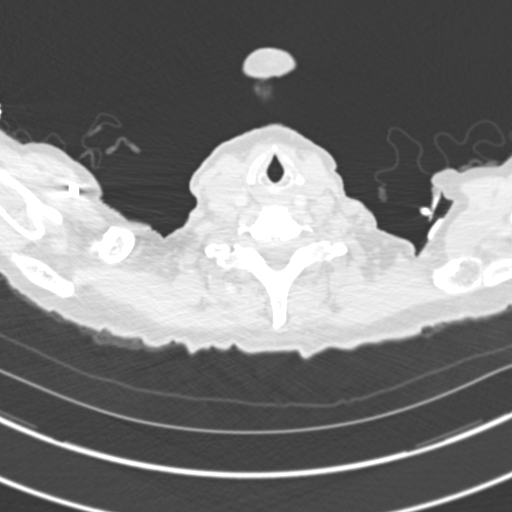

[Series 7: coronal mpr · coronal · 0.58mm/px · 2 of 73 slices shown]
[im 25/73  soft-tissue]
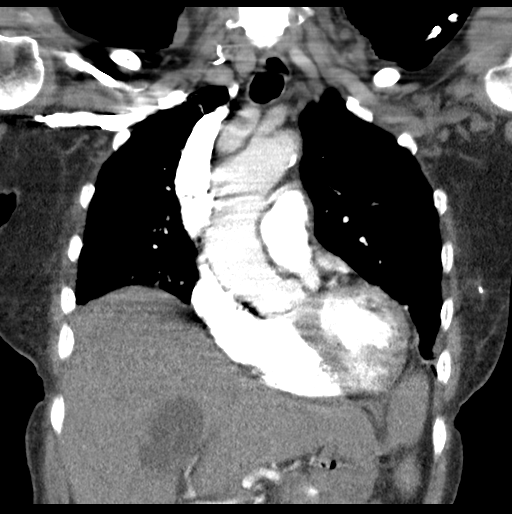
[im 49/73  soft-tissue]
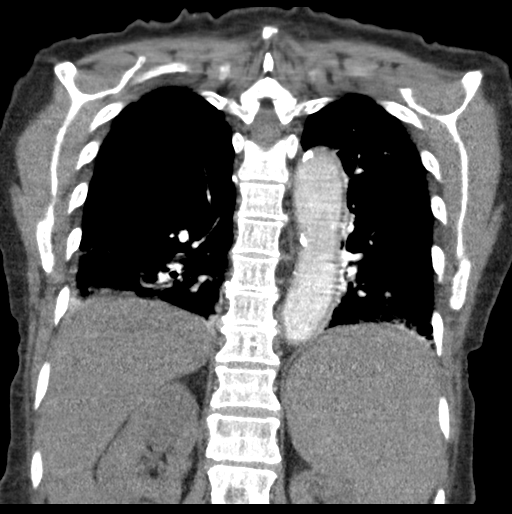

[17 of 46 positions shown; findings below may reference images not displayed]

FINDINGS: Cardiovascular: Thoracic aorta demonstrates atherosclerotic
calcifications without aneurysmal dilatation. No significant
enhancement is noted to rule out dissection. Heart is at the upper
limits of normal in size. The pulmonary artery shows a normal
branching pattern without definitive filling defect to suggest
pulmonary embolism.

Mediastinum/Nodes: Thoracic inlet is within normal limits. No
sizable hilar or mediastinal adenopathy is noted. The esophagus is
within normal limits.

Lungs/Pleura: Bibasilar atelectatic changes are noted right greater
than left with associated right-sided pleural effusion. Somewhat
lobulated nodular density is noted measuring 9 mm in the anterior
aspect of the right upper lobe best seen on image number 46 of
series 6. No other sizable nodules are seen.

Upper Abdomen: Scattered prominent cysts are noted in the central
portion of the liver measuring up to 3.8 cm.

Musculoskeletal: No chest wall abnormality. No acute or significant
osseous findings.

Review of the MIP images confirms the above findings.
IMPRESSION: No evidence of pulmonary emboli.

Bibasilar atelectasis with small right-sided pleural effusion.

9 mm lobulated nodular density in the right upper lobe. Consider one
of the following in 3 months for both low-risk and high-risk
individuals: (a) repeat chest CT, (b) follow-up PET-CT, or (c)
tissue sampling. This recommendation follows the consensus
statement: Guidelines for Management of Incidental Pulmonary Nodules
Detected on CT Images: From the [HOSPITAL] 0839; Radiology

Aortic Atherosclerosis ([id number]).
